# Patient Record
Sex: Female | Born: 1935 | Race: White | Hispanic: No | Smoking: Former smoker
Health system: Southern US, Community
[De-identification: ages and names within clinical notes are randomized; demographics above are authoritative.]

## PROBLEM LIST (undated history)

## (undated) DIAGNOSIS — M545 Low back pain, unspecified: Secondary | ICD-10-CM

## (undated) DIAGNOSIS — M1990 Unspecified osteoarthritis, unspecified site: Secondary | ICD-10-CM

## (undated) DIAGNOSIS — R011 Cardiac murmur, unspecified: Secondary | ICD-10-CM

## (undated) DIAGNOSIS — J159 Unspecified bacterial pneumonia: Secondary | ICD-10-CM

## (undated) DIAGNOSIS — G4700 Insomnia, unspecified: Secondary | ICD-10-CM

## (undated) DIAGNOSIS — I10 Essential (primary) hypertension: Secondary | ICD-10-CM

## (undated) DIAGNOSIS — B370 Candidal stomatitis: Secondary | ICD-10-CM

## (undated) DIAGNOSIS — G2581 Restless legs syndrome: Secondary | ICD-10-CM

## (undated) DIAGNOSIS — M25519 Pain in unspecified shoulder: Secondary | ICD-10-CM

## (undated) DIAGNOSIS — N390 Urinary tract infection, site not specified: Secondary | ICD-10-CM

## (undated) DIAGNOSIS — E039 Hypothyroidism, unspecified: Secondary | ICD-10-CM

## (undated) DIAGNOSIS — J441 Chronic obstructive pulmonary disease with (acute) exacerbation: Secondary | ICD-10-CM

## (undated) DIAGNOSIS — R002 Palpitations: Secondary | ICD-10-CM

## (undated) DIAGNOSIS — E871 Hypo-osmolality and hyponatremia: Secondary | ICD-10-CM

## (undated) DIAGNOSIS — B9789 Other viral agents as the cause of diseases classified elsewhere: Secondary | ICD-10-CM

## (undated) DIAGNOSIS — Z8489 Family history of other specified conditions: Secondary | ICD-10-CM

## (undated) DIAGNOSIS — C801 Malignant (primary) neoplasm, unspecified: Secondary | ICD-10-CM

## (undated) DIAGNOSIS — J4489 Other specified chronic obstructive pulmonary disease: Secondary | ICD-10-CM

## (undated) DIAGNOSIS — E559 Vitamin D deficiency, unspecified: Secondary | ICD-10-CM

## (undated) DIAGNOSIS — J449 Chronic obstructive pulmonary disease, unspecified: Secondary | ICD-10-CM

## (undated) DIAGNOSIS — R7309 Other abnormal glucose: Secondary | ICD-10-CM

## (undated) DIAGNOSIS — G0490 Encephalitis and encephalomyelitis, unspecified: Secondary | ICD-10-CM

## (undated) HISTORY — DX: Other abnormal glucose: R73.09

## (undated) HISTORY — DX: Encephalitis and encephalomyelitis, unspecified: G04.90

## (undated) HISTORY — DX: Low back pain: M54.5

## (undated) HISTORY — DX: Other specified chronic obstructive pulmonary disease: J44.89

## (undated) HISTORY — DX: Malignant (primary) neoplasm, unspecified: C80.1

## (undated) HISTORY — DX: Low back pain, unspecified: M54.50

## (undated) HISTORY — DX: Chronic obstructive pulmonary disease with (acute) exacerbation: J44.1

## (undated) HISTORY — DX: Insomnia, unspecified: G47.00

## (undated) HISTORY — DX: Unspecified bacterial pneumonia: J15.9

## (undated) HISTORY — DX: Hypo-osmolality and hyponatremia: E87.1

## (undated) HISTORY — PX: HEMORRHOID SURGERY: SHX153

## (undated) HISTORY — DX: Palpitations: R00.2

## (undated) HISTORY — PX: MASTECTOMY, RADICAL: SHX710

## (undated) HISTORY — PX: EYE SURGERY: SHX253

## (undated) HISTORY — DX: Hypothyroidism, unspecified: E03.9

## (undated) HISTORY — DX: Chronic obstructive pulmonary disease, unspecified: J44.9

## (undated) HISTORY — DX: Candidal stomatitis: B37.0

## (undated) HISTORY — DX: Vitamin D deficiency, unspecified: E55.9

## (undated) HISTORY — DX: Pain in unspecified shoulder: M25.519

## (undated) HISTORY — DX: Other viral agents as the cause of diseases classified elsewhere: B97.89

## (undated) HISTORY — DX: Essential (primary) hypertension: I10

## (undated) HISTORY — DX: Urinary tract infection, site not specified: N39.0

---

## 1959-04-20 DIAGNOSIS — G0490 Encephalitis and encephalomyelitis, unspecified: Secondary | ICD-10-CM

## 1959-04-20 HISTORY — DX: Encephalitis and encephalomyelitis, unspecified: G04.90

## 1998-12-29 ENCOUNTER — Other Ambulatory Visit: Admission: RE | Admit: 1998-12-29 | Discharge: 1998-12-29 | Payer: Self-pay | Admitting: Cardiology

## 2000-03-31 ENCOUNTER — Other Ambulatory Visit: Admission: RE | Admit: 2000-03-31 | Discharge: 2000-03-31 | Payer: Self-pay | Admitting: Obstetrics and Gynecology

## 2000-05-06 ENCOUNTER — Other Ambulatory Visit: Admission: RE | Admit: 2000-05-06 | Discharge: 2000-05-06 | Payer: Self-pay | Admitting: Obstetrics and Gynecology

## 2001-10-04 ENCOUNTER — Encounter: Payer: Self-pay | Admitting: Obstetrics and Gynecology

## 2001-10-04 ENCOUNTER — Ambulatory Visit (HOSPITAL_COMMUNITY): Admission: RE | Admit: 2001-10-04 | Discharge: 2001-10-04 | Payer: Self-pay | Admitting: Obstetrics and Gynecology

## 2001-11-16 ENCOUNTER — Ambulatory Visit (HOSPITAL_COMMUNITY): Admission: RE | Admit: 2001-11-16 | Discharge: 2001-11-16 | Payer: Self-pay | Admitting: Gastroenterology

## 2001-11-16 LAB — HM COLONOSCOPY: HM Colonoscopy: NORMAL

## 2002-04-12 ENCOUNTER — Encounter: Payer: Self-pay | Admitting: Emergency Medicine

## 2002-04-12 ENCOUNTER — Emergency Department (HOSPITAL_COMMUNITY): Admission: EM | Admit: 2002-04-12 | Discharge: 2002-04-12 | Payer: Self-pay | Admitting: Emergency Medicine

## 2002-10-16 ENCOUNTER — Encounter: Admission: RE | Admit: 2002-10-16 | Discharge: 2002-10-16 | Payer: Self-pay | Admitting: Internal Medicine

## 2002-10-16 ENCOUNTER — Encounter: Payer: Self-pay | Admitting: Internal Medicine

## 2002-12-18 ENCOUNTER — Encounter: Payer: Self-pay | Admitting: Internal Medicine

## 2002-12-18 ENCOUNTER — Encounter: Admission: RE | Admit: 2002-12-18 | Discharge: 2002-12-18 | Payer: Self-pay | Admitting: Internal Medicine

## 2003-01-01 ENCOUNTER — Encounter: Admission: RE | Admit: 2003-01-01 | Discharge: 2003-01-01 | Payer: Self-pay | Admitting: Internal Medicine

## 2003-01-01 ENCOUNTER — Encounter: Payer: Self-pay | Admitting: Internal Medicine

## 2003-01-15 ENCOUNTER — Encounter: Admission: RE | Admit: 2003-01-15 | Discharge: 2003-01-15 | Payer: Self-pay | Admitting: Orthopedic Surgery

## 2003-01-15 ENCOUNTER — Encounter: Payer: Self-pay | Admitting: Orthopedic Surgery

## 2003-04-20 HISTORY — PX: OTHER SURGICAL HISTORY: SHX169

## 2004-02-28 ENCOUNTER — Other Ambulatory Visit: Admission: RE | Admit: 2004-02-28 | Discharge: 2004-02-28 | Payer: Self-pay | Admitting: Obstetrics and Gynecology

## 2004-04-19 LAB — CONVERTED CEMR LAB: Pap Smear: NORMAL

## 2004-05-18 ENCOUNTER — Encounter: Admission: RE | Admit: 2004-05-18 | Discharge: 2004-05-18 | Payer: Self-pay | Admitting: Internal Medicine

## 2004-09-05 ENCOUNTER — Encounter: Admission: RE | Admit: 2004-09-05 | Discharge: 2004-09-05 | Payer: Self-pay | Admitting: Orthopedic Surgery

## 2004-11-20 ENCOUNTER — Ambulatory Visit (HOSPITAL_COMMUNITY): Admission: RE | Admit: 2004-11-20 | Discharge: 2004-11-20 | Payer: Self-pay | Admitting: Internal Medicine

## 2005-04-19 HISTORY — PX: OTHER SURGICAL HISTORY: SHX169

## 2005-08-09 ENCOUNTER — Ambulatory Visit (HOSPITAL_COMMUNITY): Admission: RE | Admit: 2005-08-09 | Discharge: 2005-08-09 | Payer: Self-pay | Admitting: Cardiovascular Disease

## 2006-01-26 ENCOUNTER — Encounter: Payer: Self-pay | Admitting: Family Medicine

## 2006-12-30 ENCOUNTER — Encounter: Admission: RE | Admit: 2006-12-30 | Discharge: 2006-12-30 | Payer: Self-pay | Admitting: General Surgery

## 2007-01-18 ENCOUNTER — Ambulatory Visit: Payer: Self-pay | Admitting: Family Medicine

## 2007-01-18 DIAGNOSIS — E039 Hypothyroidism, unspecified: Secondary | ICD-10-CM

## 2007-01-18 DIAGNOSIS — I10 Essential (primary) hypertension: Secondary | ICD-10-CM

## 2007-01-18 DIAGNOSIS — E871 Hypo-osmolality and hyponatremia: Secondary | ICD-10-CM | POA: Insufficient documentation

## 2007-01-19 ENCOUNTER — Encounter: Payer: Self-pay | Admitting: Family Medicine

## 2007-03-01 ENCOUNTER — Ambulatory Visit: Payer: Self-pay | Admitting: Family Medicine

## 2007-03-01 DIAGNOSIS — M545 Low back pain: Secondary | ICD-10-CM

## 2007-03-15 ENCOUNTER — Ambulatory Visit: Payer: Self-pay | Admitting: Family Medicine

## 2007-04-24 ENCOUNTER — Ambulatory Visit: Payer: Self-pay | Admitting: Family Medicine

## 2007-05-18 ENCOUNTER — Ambulatory Visit: Payer: Self-pay | Admitting: Family Medicine

## 2007-05-21 ENCOUNTER — Emergency Department (HOSPITAL_COMMUNITY): Admission: EM | Admit: 2007-05-21 | Discharge: 2007-05-21 | Payer: Self-pay | Admitting: Emergency Medicine

## 2007-05-22 ENCOUNTER — Ambulatory Visit: Payer: Self-pay | Admitting: Family Medicine

## 2007-05-26 ENCOUNTER — Ambulatory Visit: Payer: Self-pay | Admitting: Family Medicine

## 2007-05-27 ENCOUNTER — Ambulatory Visit: Payer: Self-pay | Admitting: Family Medicine

## 2007-05-29 ENCOUNTER — Ambulatory Visit: Payer: Self-pay | Admitting: Family Medicine

## 2007-06-02 ENCOUNTER — Ambulatory Visit: Payer: Self-pay | Admitting: Family Medicine

## 2007-07-17 ENCOUNTER — Ambulatory Visit: Payer: Self-pay | Admitting: Family Medicine

## 2007-07-19 ENCOUNTER — Ambulatory Visit: Payer: Self-pay | Admitting: Family Medicine

## 2007-07-24 ENCOUNTER — Ambulatory Visit: Payer: Self-pay | Admitting: Family Medicine

## 2007-07-25 LAB — CONVERTED CEMR LAB
ALT: 23 units/L (ref 0–35)
BUN: 12 mg/dL (ref 6–23)
Creatinine, Ser: 0.9 mg/dL (ref 0.4–1.2)
Eosinophils Relative: 2 % (ref 0.0–5.0)
Glucose, Bld: 105 mg/dL — ABNORMAL HIGH (ref 70–99)
HCT: 37.7 % (ref 36.0–46.0)
HDL: 57.1 mg/dL (ref >39.0)
Hemoglobin: 12.7 g/dL (ref 12.0–15.0)
Lymphocytes Relative: 17.9 % (ref 12.0–46.0)
MCHC: 33.7 g/dL (ref 30.0–36.0)
MCV: 94.3 fL (ref 78.0–100.0)
Monocytes Relative: 8.4 % (ref 3.0–12.0)
Neutro Abs: 6.2 10*3/uL (ref 1.4–7.7)
Platelets: 221 10*3/uL (ref 150–400)
Potassium: 4.1 meq/L (ref 3.5–5.1)
RBC: 4 M/uL (ref 3.87–5.11)
Sodium: 134 meq/L — ABNORMAL LOW (ref 135–145)
Total Protein: 7.8 g/dL (ref 6.0–8.3)
WBC: 8.6 10*3/uL (ref 4.5–10.5)

## 2007-10-25 ENCOUNTER — Ambulatory Visit: Payer: Self-pay | Admitting: Family Medicine

## 2007-10-25 DIAGNOSIS — G4700 Insomnia, unspecified: Secondary | ICD-10-CM

## 2007-11-09 ENCOUNTER — Encounter: Payer: Self-pay | Admitting: Family Medicine

## 2007-12-19 ENCOUNTER — Ambulatory Visit: Payer: Self-pay | Admitting: Family Medicine

## 2007-12-19 DIAGNOSIS — J449 Chronic obstructive pulmonary disease, unspecified: Secondary | ICD-10-CM

## 2007-12-26 ENCOUNTER — Ambulatory Visit: Payer: Self-pay | Admitting: Family Medicine

## 2007-12-28 ENCOUNTER — Telehealth: Payer: Self-pay | Admitting: Family Medicine

## 2008-01-01 ENCOUNTER — Encounter (INDEPENDENT_AMBULATORY_CARE_PROVIDER_SITE_OTHER): Payer: Self-pay | Admitting: *Deleted

## 2008-01-08 ENCOUNTER — Ambulatory Visit: Payer: Self-pay | Admitting: Family Medicine

## 2008-01-11 ENCOUNTER — Ambulatory Visit: Payer: Self-pay | Admitting: Family Medicine

## 2008-02-15 ENCOUNTER — Ambulatory Visit: Payer: Self-pay | Admitting: Family Medicine

## 2008-03-01 ENCOUNTER — Ambulatory Visit: Payer: Self-pay | Admitting: Family Medicine

## 2008-04-08 ENCOUNTER — Ambulatory Visit: Payer: Self-pay | Admitting: Family Medicine

## 2008-04-16 ENCOUNTER — Ambulatory Visit: Payer: Self-pay | Admitting: Family Medicine

## 2008-04-26 ENCOUNTER — Encounter: Payer: Self-pay | Admitting: Family Medicine

## 2008-04-26 ENCOUNTER — Ambulatory Visit: Payer: Self-pay | Admitting: Internal Medicine

## 2008-05-17 ENCOUNTER — Telehealth: Payer: Self-pay | Admitting: Family Medicine

## 2008-07-16 ENCOUNTER — Encounter: Admission: RE | Admit: 2008-07-16 | Discharge: 2008-07-16 | Payer: Self-pay | Admitting: Surgery

## 2008-10-01 ENCOUNTER — Telehealth: Payer: Self-pay | Admitting: Family Medicine

## 2008-10-08 ENCOUNTER — Ambulatory Visit: Payer: Self-pay | Admitting: Family Medicine

## 2008-10-14 LAB — CONVERTED CEMR LAB
ALT: 30 units/L (ref 0–35)
AST: 24 units/L (ref 0–37)
Albumin: 3.8 g/dL (ref 3.5–5.2)
Basophils Absolute: 0 10*3/uL (ref 0.0–0.1)
Basophils Relative: 0 % (ref 0.0–3.0)
Chloride: 99 meq/L (ref 96–112)
Cholesterol: 158 mg/dL (ref 0–200)
Creatinine, Ser: 0.8 mg/dL (ref 0.4–1.2)
GFR calc non Af Amer: 72.84 mL/min (ref >60)
Glucose, Bld: 112 mg/dL — ABNORMAL HIGH (ref 70–99)
HCT: 38.5 % (ref 36.0–46.0)
LDL Cholesterol: 93 mg/dL (ref 0–99)
Lymphocytes Relative: 39 % (ref 12.0–46.0)
Lymphs Abs: 3.1 10*3/uL (ref 0.7–4.0)
Monocytes Absolute: 0.6 10*3/uL (ref 0.1–1.0)
Neutro Abs: 3.9 10*3/uL (ref 1.4–7.7)
Potassium: 4.4 meq/L (ref 3.5–5.1)
Sodium: 136 meq/L (ref 135–145)
TSH: 4.44 microintl units/mL (ref 0.35–5.50)
Total Bilirubin: 0.8 mg/dL (ref 0.3–1.2)
Total CHOL/HDL Ratio: 4
Total Protein: 7.7 g/dL (ref 6.0–8.3)
Triglycerides: 127 mg/dL (ref 0.0–149.0)
Vit D, 25-Hydroxy: 25 ng/mL — ABNORMAL LOW (ref 30–89)
Vitamin B-12: 496 pg/mL (ref 211–911)
WBC: 7.9 10*3/uL (ref 4.5–10.5)

## 2008-10-16 ENCOUNTER — Ambulatory Visit: Payer: Self-pay | Admitting: Family Medicine

## 2008-10-30 ENCOUNTER — Encounter: Payer: Self-pay | Admitting: Family Medicine

## 2008-12-06 ENCOUNTER — Encounter: Payer: Self-pay | Admitting: Family Medicine

## 2009-01-28 ENCOUNTER — Ambulatory Visit: Payer: Self-pay | Admitting: Family Medicine

## 2009-05-19 ENCOUNTER — Ambulatory Visit: Payer: Self-pay | Admitting: Family Medicine

## 2009-05-21 LAB — CONVERTED CEMR LAB
AST: 29 units/L (ref 0–37)
Bilirubin, Direct: 0.1 mg/dL (ref 0.0–0.3)
Calcium: 9.3 mg/dL (ref 8.4–10.5)
GFR calc non Af Amer: 63.48 mL/min (ref >60)
Glucose, Bld: 100 mg/dL — ABNORMAL HIGH (ref 70–99)
Total Bilirubin: 0.4 mg/dL (ref 0.3–1.2)
Total Protein: 8 g/dL (ref 6.0–8.3)

## 2009-10-07 ENCOUNTER — Encounter: Admission: RE | Admit: 2009-10-07 | Discharge: 2009-10-07 | Payer: Self-pay | Admitting: Surgery

## 2009-10-14 ENCOUNTER — Ambulatory Visit: Payer: Self-pay | Admitting: Family Medicine

## 2009-10-14 LAB — CONVERTED CEMR LAB
Bilirubin, Direct: 0.1 mg/dL (ref 0.0–0.3)
CO2: 30 meq/L (ref 19–32)
Calcium: 9.1 mg/dL (ref 8.4–10.5)
Chloride: 102 meq/L (ref 96–112)
Creatinine, Ser: 0.8 mg/dL (ref 0.4–1.2)
GFR calc non Af Amer: 72.66 mL/min (ref >60)
HDL: 44.4 mg/dL (ref >39.00)
LDL Cholesterol: 106 mg/dL — ABNORMAL HIGH (ref 0–99)
TSH: 3.13 microintl units/mL (ref 0.35–5.50)
Total CHOL/HDL Ratio: 4
Triglycerides: 162 mg/dL — ABNORMAL HIGH (ref 0.0–149.0)
Vit D, 25-Hydroxy: 20 ng/mL — ABNORMAL LOW (ref 30–89)

## 2009-10-17 ENCOUNTER — Ambulatory Visit: Payer: Self-pay | Admitting: Family Medicine

## 2009-10-17 DIAGNOSIS — E559 Vitamin D deficiency, unspecified: Secondary | ICD-10-CM

## 2009-10-17 DIAGNOSIS — R7303 Prediabetes: Secondary | ICD-10-CM | POA: Insufficient documentation

## 2009-11-12 ENCOUNTER — Ambulatory Visit: Payer: Self-pay | Admitting: Family Medicine

## 2010-01-09 ENCOUNTER — Encounter: Payer: Self-pay | Admitting: Family Medicine

## 2010-01-27 ENCOUNTER — Ambulatory Visit: Payer: Self-pay | Admitting: Family Medicine

## 2010-03-24 ENCOUNTER — Ambulatory Visit: Payer: Self-pay | Admitting: Family Medicine

## 2010-04-23 ENCOUNTER — Ambulatory Visit
Admission: RE | Admit: 2010-04-23 | Discharge: 2010-04-23 | Payer: Self-pay | Source: Home / Self Care | Attending: Family Medicine | Admitting: Family Medicine

## 2010-04-23 DIAGNOSIS — N390 Urinary tract infection, site not specified: Secondary | ICD-10-CM | POA: Insufficient documentation

## 2010-04-23 LAB — CONVERTED CEMR LAB
Nitrite: NEGATIVE
Specific Gravity, Urine: 1.02

## 2010-04-24 ENCOUNTER — Encounter: Payer: Self-pay | Admitting: Family Medicine

## 2010-04-30 ENCOUNTER — Telehealth: Payer: Self-pay | Admitting: Family Medicine

## 2010-05-19 NOTE — Assessment & Plan Note (Signed)
Summary: FLU SHOT/CLE  Nurse Visit   Allergies: 1)  ! Demerol  Orders Added: 1)  Flu Vaccine 86yrs + MEDICARE PATIENTS [Q2039] 2)  Administration Flu vaccine - MCR [G0008]  Flu Vaccine Consent Questions     Do you have a history of severe allergic reactions to this vaccine? no    Any prior history of allergic reactions to egg and/or gelatin? no    Do you have a sensitivity to the preservative Thimersol? no    Do you have a past history of Guillan-Barre Syndrome? no    Do you currently have an acute febrile illness? no    Have you ever had a severe reaction to latex? no    Vaccine information given and explained to patient? yes    Are you currently pregnant? no    Lot Number:[id number]   Exp Date:10/17/2010   Site Given  Left Deltoid IM

## 2010-05-19 NOTE — Letter (Signed)
Summary: [facility]   Imported By: Sherian Rein 01/29/2010 10:20:52  _____________________________________________________________________  External Attachment:    Type:   Image     Comment:   External Document

## 2010-05-19 NOTE — Assessment & Plan Note (Signed)
Summary: CPX / LFW   Vital Signs:  Patient profile:   77 year old female Height:      64.5 inches Weight:      190.6 pounds BMI:     32.33 Temp:     98.2 degrees F oral Pulse rate:   72 / minute Pulse rhythm:   regular BP sitting:   140 / 80  (left arm) Cuff size:   large  Vitals Entered By: Benny Lennert CMA Duncan Dull) (October 17, 2009 11:16 AM)  History of Present Illness: Chief complaint cpx  Doing well overall. Uses cane. No recent falls.    Cholesterol..increase from last year, but not far from goal.  Prediabetes, increase from last year. Eats a lot of white bread and chocolate. Gatorade for low potassium.   Reviewed labs in detatil...low vit D...refuses DXA. Not on calciuma nd vit D supplement.   Hypertension History:      She denies headache, chest pain, palpitations, orthopnea, and side effects from treatment.  Not checking a t home.   Stable sweling peripherally on diueretic. Marland Kitchen        Positive major cardiovascular risk factors include female age 58 years old or older and hypertension.  Negative major cardiovascular risk factors include non-tobacco-user status.     Problems Prior to Update: 1)  Routine General Medical Exam@[facility]  (ICD-V70.0) 2)  Chronic Obstructive Pulmonary Disease  (ICD-496) 3)  Insomnia, Chronic  (ICD-307.42) 4)  Shoulder Pain, Right, Chronic  (ICD-719.41) 5)  Palpitations  (ICD-785.1) 6)  Screening For Lipoid Disorders  (ICD-V77.91) 7)  Candidiasis, Oral  (ICD-112.0) 8)  Chronic Obstructive Pulmonary Disease, Acute Exacerbation  (ICD-491.21) 9)  Viral Infection  (ICD-079.99) 10)  Back Pain, Lumbar  (ICD-724.2) 11)  Hypertension  (ICD-401.9) 12)  Hyponatremia, Mild  (ICD-276.1) 13)  Hypothyroidism  (ICD-244.9)  Current Medications (verified): 1)  Levothyroxine Sodium 88 Mcg  Tabs (Levothyroxine Sodium) .... Take 1 Tablet By Mouth Once A Day 2)  Amlodipine Besylate 10 Mg  Tabs (Amlodipine Besylate) .... Take 1 Tablet By Mouth Once A  Day 3)  Diovan 160 Mg  Tabs (Valsartan) .... Take 1 Tablet By Mouth Once A Day 4)  Toprol Xl 50 Mg Xr24h-Tab (Metoprolol Succinate) .... Take One By Mouth Daily 5)  Adult Aspirin Ec Low Strength 81 Mg  Tbec (Aspirin) .... Take 1 Tablet By Mouth Once A Day 6)  Furosemide 40 Mg  Tabs (Furosemide) .... Take 1 Tablet By Mouth Every Morning 7)  Advil 200 Mg Tabs (Ibuprofen) .... Take One Table By Mouth Twice Daily As Needed 8)  Vitamin D (Ergocalciferol) 50000 Unit Caps (Ergocalciferol) .Marland Kitchen.. 1 Tab By Mouth Weekly X 12 Weeks  Allergies: 1)  ! Demerol  Past History:  Past medical, surgical, family and social histories (including risk factors) reviewed, and no changes noted (except as noted below).  Past Medical History: Reviewed history from 03/01/2008 and no changes required. Current Problems:  CHRONIC OBSTRUCTIVE PULMONARY DISEASE, MILD (ICD-496) INSOMNIA, CHRONIC (ICD-307.42) SHOULDER PAIN, RIGHT, CHRONIC (ICD-719.41) PALPITATIONS (ICD-785.1) SCREENING FOR LIPOID DISORDERS (ICD-V77.91) BACTERIAL PNEUMONIA (ICD-482.9) CANDIDIASIS, ORAL (ICD-112.0) CHRONIC OBSTRUCTIVE PULMONARY DISEASE, ACUTE EXACERBATION (ICD-491.21) VIRAL INFECTION (ICD-079.99) BACK PAIN, LUMBAR (ICD-724.2) HYPERTENSION (ICD-401.9) HYPONATREMIA, MILD (ICD-276.1) HYPOTHYROIDISM (ICD-244.9)    Past Surgical History: Reviewed history from 03/01/2007 and no changes required. 04/19/05 ABI neg, 04/19/05 LE venous dopplers nml MRI of back in2005 mild bulging discs  Family History: Reviewed history from 01/18/2007 and no changes required. father died age 55 RA  mother died age 69 CVA, DM,  2 sister healthy aunts liver cancer, stomach cancer  Social History: Reviewed history from 03/01/2007 and no changes required. Married x 60 years Occupation: Former Smoker 20 pack year history (1/2 pack a day x 42 years) Alcohol use-no Drug use-no Regular exercise-no: uses cane to ambulate, unsteady on feet Diet: moderately  healthy. a lot of desserts  Review of Systems General:  Denies fatigue and fever. CV:  Denies chest pain or discomfort. Resp:  Denies shortness of breath, sputum productive, and wheezing. GI:  Denies abdominal pain. GU:  Denies dysuria.  Physical Exam  General:  overweight female in NAD Eyes:  No corneal or conjunctival inflammation noted. EOMI. Perrla.  Ears:  External ear exam shows no significant lesions or deformities.  Otoscopic examination reveals clear canals, tympanic membranes are intact bilaterally without bulging, retraction, inflammation or discharge. Hearing is grossly normal bilaterally. Nose:  External nasal examination shows no deformity or inflammation. Nasal mucosa are pink and moist without lesions or exudates. Mouth:  Oral mucosa and oropharynx without lesions or exudates.  MMM Neck:  no carotid bruit or thyromegaly no cervical or supraclavicular lymphadenopathy  Breasts:  per onc surgeon Lungs:  Normal respiratory effort, chest expands symmetrically. Lungs are clear to auscultation, no crackles or wheezes. Heart:  Normal rate and regular rhythm. S1 and S2 normal without gallop, murmur, click, rub or other extra sounds. Abdomen:  Bowel sounds positive,abdomen soft and non-tender without masses, organomegaly or hernias noted. Genitalia:  not indicated Msk:  No deformity or scoliosis noted of thoracic or lumbar spine.   Pulses:  R and L posterior tibial pulses are full and equal bilaterally  Extremities:  no edema Neurologic:  No cranial nerve deficits noted. Slow gait, wlaks with cane.  Sensory, motor and coordinative functions appear intact.alert & oriented X3.   Skin:  Intact without suspicious lesions or rashes Psych:  Cognition and judgment appear intact. Alert and cooperative with normal attention span and concentration. No apparent delusions, illusions, hallucinations   Impression & Recommendations:  Problem # 1:  CHRONIC OBSTRUCTIVE PULMONARY DISEASE  (ICD-496) Stable   Problem # 2:  HYPERTENSION (ICD-401.9) Well controlled. Continue current medication.  Her updated medication list for this problem includes:    Amlodipine Besylate 10 Mg Tabs (Amlodipine besylate) .Marland Kitchen... Take 1 tablet by mouth once a day    Diovan 160 Mg Tabs (Valsartan) .Marland Kitchen... Take 1 tablet by mouth once a day    Toprol Xl 50 Mg Xr24h-tab (Metoprolol succinate) .Marland Kitchen... Take one by mouth daily    Furosemide 40 Mg Tabs (Furosemide) .Marland Kitchen... Take 1 tablet by mouth every morning  Problem # 3:  HYPOTHYROIDISM (ICD-244.9) Well controlled. Continue current medication.  Her updated medication list for this problem includes:    Levothyroxine Sodium 88 Mcg Tabs (Levothyroxine sodium) .Marland Kitchen... Take 1 tablet by mouth once a day  Problem # 4:  HYPONATREMIA, MILD (ICD-276.1) Normal.  Problem # 5:  PREDIABETES (ICD-790.29) Encouraged exercise, weight loss, healthy eating habits.  Labs Reviewed: Creat: 0.9 (05/19/2009)     Complete Medication List: 1)  Levothyroxine Sodium 88 Mcg Tabs (Levothyroxine sodium) .... Take 1 tablet by mouth once a day 2)  Amlodipine Besylate 10 Mg Tabs (Amlodipine besylate) .... Take 1 tablet by mouth once a day 3)  Diovan 160 Mg Tabs (Valsartan) .... Take 1 tablet by mouth once a day 4)  Toprol Xl 50 Mg Xr24h-tab (Metoprolol succinate) .... Take one by mouth daily 5)  Adult Aspirin  Ec Low Strength 81 Mg Tbec (Aspirin) .... Take 1 tablet by mouth once a day 6)  Furosemide 40 Mg Tabs (Furosemide) .... Take 1 tablet by mouth every morning 7)  Advil 200 Mg Tabs (Ibuprofen) .... Take one table by mouth twice daily as needed 8)  Vitamin D (ergocalciferol) 50000 Unit Caps (Ergocalciferol) .Marland Kitchen.. 1 tab by mouth weekly x 12 weeks  Hypertension Assessment/Plan:      The patient's hypertensive risk group is category B: At least one risk factor (excluding diabetes) with no target organ damage.  Her calculated 10 year risk of coronary heart disease is 13 %.  Today's blood  pressure is 140/80.  Her blood pressure goal is < 140/90.  Patient Instructions: 1)  Decrease carbohydraytes and sweet foods for prediabetes. 2)  Look into mineral water instead of gatorade.  3)  Start vit Vit D weekly...x 12 weeks then change to Calcium and vit D supplement.  4)  Please schedule a follow-up appointment in 6 months .  Prescriptions: VITAMIN D (ERGOCALCIFEROL) 50000 UNIT CAPS (ERGOCALCIFEROL) 1 tab by mouth weekly x 12 weeks  #12 x 0   Entered and Authorized by:   Kerby Nora MD   Signed by:   Kerby Nora MD on 10/17/2009   Method used:   Electronically to        [facility]* (retail)       [address]       [place], [place]  [address]       Ph: [phone]       Fax: [phone]   RxID:   [id number]   Current Allergies (reviewed today): ! DEMEROL  TD Next Due:  Refused Herpes Zoster Result Date:  10/17/2009 Herpes Zoster Result:  given Last Mammogram:  normal (07/18/2008 12:31:31 PM) Mammogram Result Date:  10/06/2009 Mammogram Result:  normal Mammogram Next Due:  1 yr Bone Density Next Due: Refused      Appended Document: CPX / LFW    Clinical Lists Changes  Orders: Added new Service order of Zoster (Shingles) Vaccine Live [phone]) - Signed Added new Service order of Admin 1st Vaccine (32951) - Signed Observations: Added new observation of ZOSTAVAX VIS: 01/29/05 given October 17, 2009. (10/17/2009 12:14) Added new observation of ZOSTAVAX LOT: [id number] (10/17/2009 12:14) Added new observation of ZOSTAVAX EXP: 11/12/2010 (10/17/2009 12:14) Added new observation of ZOSTAVAX BY: Benny Lennert CMA (AAMA) (10/17/2009 12:14) Added new observation of ZOSTAVAX RTE: [place] (10/17/2009 12:14) Added new observation of ZOSTAVAXDOSE: 0.5 ml (10/17/2009 12:14) Added new observation of ZOSTAVAX MFR: Merck (10/17/2009 12:14) Added new observation of ZOSTAVAXSITE: left deltoid (10/17/2009 12:14) Added new observation of ZOSTAVAX: Zostavax (10/17/2009  12:14)       Immunizations Administered:  Zostavax # 1:    Vaccine Type: Zostavax    Site: left deltoid    Mfr: Merck    Dose: 0.5 ml    Route: [place]    Given by: Benny Lennert CMA (AAMA)    Exp. Date: 11/12/2010    Lot #: [id number]    VIS given: 01/29/05 given October 17, 2009.

## 2010-05-19 NOTE — Assessment & Plan Note (Signed)
Summary: LEFT EYE SCRATCH/DLO   Vital Signs:  Patient profile:   77 year old female Height:      64.5 inches Weight:      189.8 pounds BMI:     32.19 Temp:     97.8 degrees F oral Pulse rate:   72 / minute Pulse rhythm:   regular BP sitting:   130 / 70  (left arm) Cuff size:   large  Vitals Entered By: Benny Lennert CMA Duncan Dull) (November 12, 2009 4:05 PM)  History of Present Illness: Chief complaint scratched left eye  over the weekend, patient scratch her left thigh. She did develop some small amount of bleeding and this was at her eyelid level. She has baseline some significant allergies, and now she has some significant irritation around the periorbital areas well.  There is itching and irritation.  otherwise she feels fine is not having fever, chills or sweats. Tolerate p.o. fine.    Allergies: 1)  ! Demerol  Past History:  Past medical, surgical, family and social histories (including risk factors) reviewed, and no changes noted (except as noted below).  Past Medical History: Reviewed history from 03/01/2008 and no changes required. Current Problems:  CHRONIC OBSTRUCTIVE PULMONARY DISEASE, MILD (ICD-496) INSOMNIA, CHRONIC (ICD-307.42) SHOULDER PAIN, RIGHT, CHRONIC (ICD-719.41) PALPITATIONS (ICD-785.1) SCREENING FOR LIPOID DISORDERS (ICD-V77.91) BACTERIAL PNEUMONIA (ICD-482.9) CANDIDIASIS, ORAL (ICD-112.0) CHRONIC OBSTRUCTIVE PULMONARY DISEASE, ACUTE EXACERBATION (ICD-491.21) VIRAL INFECTION (ICD-079.99) BACK PAIN, LUMBAR (ICD-724.2) HYPERTENSION (ICD-401.9) HYPONATREMIA, MILD (ICD-276.1) HYPOTHYROIDISM (ICD-244.9)    Past Surgical History: Reviewed history from 03/01/2007 and no changes required. 04/19/05 ABI neg, 04/19/05 LE venous dopplers nml MRI of back in2005 mild bulging discs  Family History: Reviewed history from 01/18/2007 and no changes required. father died age 48 RA mother died age 41 CVA, DM,  2 sister healthy aunts liver cancer, stomach  cancer  Social History: Reviewed history from 03/01/2007 and no changes required. Married x 60 years Occupation: Former Smoker 20 pack year history (1/2 pack a day x 42 years) Alcohol use-no Drug use-no Regular exercise-no: uses cane to ambulate, unsteady on feet Diet: moderately healthy. a lot of desserts  Physical Exam  General:  GEN: Well-developed,well-nourished,in no acute distress; alert,appropriate and cooperative throughout examination HEENT: Normocephalic and atraumatic without obvious abnormalities. No apparent alopecia or balding. Ears, externally no deformities PULM: Breathing comfortably in no respiratory distress EXT: No clubbing, cyanosis, or edema PSYCH: Normally interactive. Cooperative during the interview. Pleasant. Friendly and conversant. Not anxious or depressed appearing. Normal, full affect.    Eye Exam  Visual Fields     OD: normal right     OS: normal left Ocular Motility     OD: normal right     OS: normal left Adnexa and Eyelids     OD: normal right     OS: normal left Conjunctiva     OD: irritation     OS: iritation Iris and Pupil     OD: normal right     OS: normal left Cornea     OD: normal right     OS: left cornea on the floor seen examination is noted to have a vertical line  where there is floor seen uptake. This is aparent laterally   Impression & Recommendations:  Problem # 1:  CORNEAL ABRASION, LEFT (ICD-918.1) foreseen examination is consistent with corneal abrasion.  I I will treat the patient conservatively, and placed on antibiotics every 30 minutes for 2 days  and then have her followup with  ophthalmology.  Also had my partner Dr. Milinda Antis visualize her eye under fluoroscein, and she agreed with my dx.   Orders: Ophthalmology Referral (Ophthalmology)  Complete Medication List: 1)  Levothyroxine Sodium 88 Mcg Tabs (Levothyroxine sodium) .... Take 1 tablet by mouth once a day 2)  Amlodipine Besylate 10 Mg Tabs (Amlodipine  besylate) .... Take 1 tablet by mouth once a day 3)  Diovan 160 Mg Tabs (Valsartan) .... Take 1 tablet by mouth once a day 4)  Toprol Xl 50 Mg Xr24h-tab (Metoprolol succinate) .... Take one by mouth daily 5)  Adult Aspirin Ec Low Strength 81 Mg Tbec (Aspirin) .... Take 1 tablet by mouth once a day 6)  Furosemide 40 Mg Tabs (Furosemide) .... Take 1 tablet by mouth every morning 7)  Advil 200 Mg Tabs (Ibuprofen) .... Take one table by mouth twice daily as needed 8)  Vitamin D (ergocalciferol) 50000 Unit Caps (Ergocalciferol) .Marland Kitchen.. 1 tab by mouth weekly x 12 weeks 9)  Ofloxacin 0.3 % Soln (Ofloxacin) .Marland Kitchen.. 1 drop q 30 mins while awake and q 4 hours during sleep for 48 hours, then 1 drop q 1 hour for 5 days, then 1 drop 4 times daily for 3 days  Patient Instructions: 1)  f/u with your eye doctor  Prescriptions: OFLOXACIN 0.3 % SOLN (OFLOXACIN) 1 drop q 30 MINs WHILE AWAKE AND q 4 HOURS DURING SLEEP for 48 hours, then 1 drop q 1 hour for 5 days, then 1 drop 4 times daily for 3 days  #1 x 0   Entered and Authorized by:   Hannah Beat MD   Signed by:   Hannah Beat MD on 11/12/2009   Method used:   Electronically to        [facility]* (retail)       [address]       [place], [place]  [address]       Ph: [phone]       Fax: [phone]   RxID:   [id number]   Current Allergies (reviewed today): ! DEMEROL

## 2010-05-19 NOTE — Assessment & Plan Note (Signed)
Summary: FOLLOW UP / LFW   Vital Signs:  Patient profile:   77 year old female Weight:      189.38 pounds Temp:     98.1 degrees F oral Pulse rate:   72 / minute Pulse rhythm:   regular BP sitting:   124 / 70  (left arm) Cuff size:   large  Vitals Entered By: Benny Lennert CMA Duncan Dull) (May 19, 2009 11:10 AM) CC: 6 month follow up, Hypertension Management   History of Present Illness: Doing well overall. No acute concerns.   Husband with double PNA.Marland Kitchenno CVA neg MRI. She has been  experiencing stress.  Hypertension History:      She denies headache, chest pain, peripheral edema, and side effects from treatment.  She notes no problems with any antihypertensive medication side effects.        Positive major cardiovascular risk factors include female age 58 years old or older and hypertension.  Negative major cardiovascular risk factors include non-tobacco-user status.    Preventive Screening-Counseling & Management  Alcohol-Tobacco     Alcohol drinks/day: 0     Smoking Status: never  Caffeine-Diet-Exercise     Diet Counseling: to improve diet; diet is suboptimal     Nutrition Referrals: no     Does Patient Exercise: no     Exercise Counseling: to improve exercise regimen  Problems Prior to Update: 1)  Chronic Obstructive Pulmonary Disease  (ICD-496) 2)  Insomnia, Chronic  (ICD-307.42) 3)  Shoulder Pain, Right, Chronic  (ICD-719.41) 4)  Palpitations  (ICD-785.1) 5)  Screening For Lipoid Disorders  (ICD-V77.91) 6)  Candidiasis, Oral  (ICD-112.0) 7)  Chronic Obstructive Pulmonary Disease, Acute Exacerbation  (ICD-491.21) 8)  Viral Infection  (ICD-079.99) 9)  Back Pain, Lumbar  (ICD-724.2) 10)  Hypertension  (ICD-401.9) 11)  Hyponatremia, Mild  (ICD-276.1) 12)  Hypothyroidism  (ICD-244.9)  Current Medications (verified): 1)  Levothyroxine Sodium 88 Mcg  Tabs (Levothyroxine Sodium) .... Take 1 Tablet By Mouth Once A Day 2)  Amlodipine Besylate 10 Mg  Tabs  (Amlodipine Besylate) .... Take 1 Tablet By Mouth Once A Day 3)  Diovan 160 Mg  Tabs (Valsartan) .... Take 1 Tablet By Mouth Once A Day 4)  Toprol Xl 50 Mg Xr24h-Tab (Metoprolol Succinate) .... Take One By Mouth Daily 5)  Adult Aspirin Ec Low Strength 81 Mg  Tbec (Aspirin) .... Take 1 Tablet By Mouth Once A Day 6)  Furosemide 40 Mg  Tabs (Furosemide) .... Take 1 Tablet By Mouth Every Morning 7)  Advil 200 Mg Tabs (Ibuprofen) .... Take One Table By Mouth Twice Daily As Needed  Allergies: 1)  ! Demerol  Past History:  Past medical, surgical, family and social histories (including risk factors) reviewed, and no changes noted (except as noted below).  Past Medical History: Reviewed history from 03/01/2008 and no changes required. Current Problems:  CHRONIC OBSTRUCTIVE PULMONARY DISEASE, MILD (ICD-496) INSOMNIA, CHRONIC (ICD-307.42) SHOULDER PAIN, RIGHT, CHRONIC (ICD-719.41) PALPITATIONS (ICD-785.1) SCREENING FOR LIPOID DISORDERS (ICD-V77.91) BACTERIAL PNEUMONIA (ICD-482.9) CANDIDIASIS, ORAL (ICD-112.0) CHRONIC OBSTRUCTIVE PULMONARY DISEASE, ACUTE EXACERBATION (ICD-491.21) VIRAL INFECTION (ICD-079.99) BACK PAIN, LUMBAR (ICD-724.2) HYPERTENSION (ICD-401.9) HYPONATREMIA, MILD (ICD-276.1) HYPOTHYROIDISM (ICD-244.9)    Past Surgical History: Reviewed history from 03/01/2007 and no changes required. 04/19/05 ABI neg, 04/19/05 LE venous dopplers nml MRI of back in2005 mild bulging discs  Family History: Reviewed history from 01/18/2007 and no changes required. father died age 32 RA mother died age 93 CVA, DM,  2 sister healthy aunts liver cancer, stomach cancer  Social History: Reviewed history from 03/01/2007 and no changes required. Married x 60 years Occupation: Former Smoker 20 pack year history (1/2 pack a day x 42 years) Alcohol use-no Drug use-no Regular exercise-no: uses cane to ambulate, unsteady on feet Diet: moderately healthy. a lot of dessertsSmoking Status:   never  Review of Systems General:  Denies fatigue and fever. CV:  Denies chest pain or discomfort. Resp:  Denies shortness of breath, sputum productive, and wheezing. GI:  Denies abdominal pain, bloody stools, constipation, and diarrhea. GU:  Denies abnormal vaginal bleeding and dysuria.  Physical Exam  General:  overweight female in NAD Head:  Normocephalic and atraumatic without obvious abnormalities. No apparent alopecia or balding. Eyes:  No corneal or conjunctival inflammation noted. EOMI. Perrla.  Ears:  External ear exam shows no significant lesions or deformities.  Otoscopic examination reveals clear canals, tympanic membranes are intact bilaterally without bulging, retraction, inflammation or discharge. Hearing is grossly normal bilaterally. Nose:  External nasal examination shows no deformity or inflammation. Nasal mucosa are pink and moist without lesions or exudates. Mouth:  Oral mucosa and oropharynx without lesions or exudates.  MMM Neck:  no carotid bruit or thyromegaly no cervical or supraclavicular lymphadenopathy  Breasts:  refused Lungs:  Normal respiratory effort, chest expands symmetrically. Lungs are clear to auscultation, no crackles or wheezes. Heart:  Normal rate and regular rhythm. S1 and S2 normal without gallop, murmur, click, rub or other extra sounds. Abdomen:  Bowel sounds positive,abdomen soft and non-tender without masses, organomegaly or hernias noted. Genitalia:  not indicated Pulses:  R and L posterior tibial pulses are full and equal bilaterally  Extremities:  no edema   Impression & Recommendations:  Problem # 1:  CHRONIC OBSTRUCTIVE PULMONARY DISEASE (ICD-496) Stable on current medicaitons.  The following medications were removed from the medication list:    Spiriva Handihaler 18 Mcg Caps (Tiotropium bromide monohydrate) .Marland Kitchen... 1cap inhaled daily  Problem # 2:  HYPERTENSION (ICD-401.9) Well controlled. Continue current medication. Due for labs  eval.  Her updated medication list for this problem includes:    Amlodipine Besylate 10 Mg Tabs (Amlodipine besylate) .Marland Kitchen... Take 1 tablet by mouth once a day    Diovan 160 Mg Tabs (Valsartan) .Marland Kitchen... Take 1 tablet by mouth once a day    Toprol Xl 50 Mg Xr24h-tab (Metoprolol succinate) .Marland Kitchen... Take one by mouth daily    Furosemide 40 Mg Tabs (Furosemide) .Marland Kitchen... Take 1 tablet by mouth every morning  Orders: TLB-BMP (Basic Metabolic Panel-BMET) (80048-METABOL) TLB-Hepatic/Liver Function Pnl (80076-HEPATIC)  Problem # 3:  Preventive Health Care (ICD-V70.0) Assessment: Comment Only Reviewed preventive care protocols, scheduled due services, and updated immunizations.  Complete Medication List: 1)  Levothyroxine Sodium 88 Mcg Tabs (Levothyroxine sodium) .... Take 1 tablet by mouth once a day 2)  Amlodipine Besylate 10 Mg Tabs (Amlodipine besylate) .... Take 1 tablet by mouth once a day 3)  Diovan 160 Mg Tabs (Valsartan) .... Take 1 tablet by mouth once a day 4)  Toprol Xl 50 Mg Xr24h-tab (Metoprolol succinate) .... Take one by mouth daily 5)  Adult Aspirin Ec Low Strength 81 Mg Tbec (Aspirin) .... Take 1 tablet by mouth once a day 6)  Furosemide 40 Mg Tabs (Furosemide) .... Take 1 tablet by mouth every morning 7)  Advil 200 Mg Tabs (Ibuprofen) .... Take one table by mouth twice daily as needed  Hypertension Assessment/Plan:      The patient's hypertensive risk group is category B: At least one risk factor (  excluding diabetes) with no target organ damage.  Her calculated 10 year risk of coronary heart disease is 9 %.  Today's blood pressure is 124/70.  Her blood pressure goal is < 140/90.   Patient Instructions: 1)  Please schedule a follow-up appointment in 6 months  CPX 2)  BMP prior to visit, ICD-9: 272.0, 244.9 3)  Hepatic Panel prior to visit ICD-9:  4)  Lipid panel prior to visit ICD-9 :  5)  TSH prior to visit ICD-9 :  6)  vit D 780.79 7)  Call insurance about shingles vaccine.     Current Allergies (reviewed today): ! DEMEROL

## 2010-05-19 NOTE — Assessment & Plan Note (Signed)
Summary: 6 m f/u dlo   Vital Signs:  Patient profile:   77 year old female Height:      64.5 inches Weight:      191.75 pounds BMI:     32.52 Temp:     98.4 degrees F oral Pulse rate:   72 / minute Pulse rhythm:   regular BP sitting:   128 / 72  (left arm) Cuff size:   regular  Vitals Entered By: Benny Lennert CMA Duncan Dull) (March 24, 2010 10:57 AM)  History of Present Illness: Chief complaint 6 month follow up   Doing well overall. Uses cane. No recent falls.    Hypertension History:       She denies headache, chest pain, palpitations, orthopnea, and side effects from treatment.  Not checking a t home.   Stable sweling peripherally on diueretic. Marland Kitchen        Positive major cardiovascular risk factors include female age 75 years old or older and hypertension.  Negative major cardiovascular risk factors include non-tobacco-user status.    Saw surgeon for hx of breast cancer...Dr. Ezzard Standing..can space these visits to every 2 years.  Intermittant anxiety.. interferes with sleep.. she has used clorazepate  at beftime about once a month, , most frequently would be once a week.  No symptoms the following day.   Problems Prior to Update: 1)  Corneal Abrasion, Left  (ICD-918.1) 2)  Unspecified Vitamin D Deficiency  (ICD-268.9) 3)  Prediabetes  (ICD-790.29) 4)  Chronic Obstructive Pulmonary Disease  (ICD-496) 5)  Insomnia, Chronic  (ICD-307.42) 6)  Back Pain, Lumbar  (ICD-724.2) 7)  Hypertension  (ICD-401.9) 8)  Hyponatremia, Mild  (ICD-276.1) 9)  Hypothyroidism  (ICD-244.9)  Current Medications (verified): 1)  Levothyroxine Sodium 88 Mcg  Tabs (Levothyroxine Sodium) .... Take 1 Tablet By Mouth Once A Day 2)  Amlodipine Besylate 10 Mg  Tabs (Amlodipine Besylate) .... Take 1 Tablet By Mouth Once A Day 3)  Diovan 160 Mg  Tabs (Valsartan) .... Take 1 Tablet By Mouth Once A Day 4)  Toprol Xl 50 Mg Xr24h-Tab (Metoprolol Succinate) .... Take One By Mouth Daily 5)  Adult Aspirin Ec Low  Strength 81 Mg  Tbec (Aspirin) .... Take 1 Tablet By Mouth Once A Day 6)  Furosemide 40 Mg  Tabs (Furosemide) .... Take 1 Tablet By Mouth Every Morning 7)  Advil 200 Mg Tabs (Ibuprofen) .... Take One Table By Mouth Twice Daily As Needed  Allergies: 1)  ! Demerol  Past History:  Past medical, surgical, family and social histories (including risk factors) reviewed, and no changes noted (except as noted below).  Past Medical History: Reviewed history from 03/01/2008 and no changes required. Current Problems:  CHRONIC OBSTRUCTIVE PULMONARY DISEASE, MILD (ICD-496) INSOMNIA, CHRONIC (ICD-307.42) SHOULDER PAIN, RIGHT, CHRONIC (ICD-719.41) PALPITATIONS (ICD-785.1) SCREENING FOR LIPOID DISORDERS (ICD-V77.91) BACTERIAL PNEUMONIA (ICD-482.9) CANDIDIASIS, ORAL (ICD-112.0) CHRONIC OBSTRUCTIVE PULMONARY DISEASE, ACUTE EXACERBATION (ICD-491.21) VIRAL INFECTION (ICD-079.99) BACK PAIN, LUMBAR (ICD-724.2) HYPERTENSION (ICD-401.9) HYPONATREMIA, MILD (ICD-276.1) HYPOTHYROIDISM (ICD-244.9)    Past Surgical History: Reviewed history from 03/01/2007 and no changes required. 04/19/05 ABI neg, 04/19/05 LE venous dopplers nml MRI of back in2005 mild bulging discs  Family History: Reviewed history from 01/18/2007 and no changes required. father died age 67 RA mother died age 82 CVA, DM,  2 sister healthy aunts liver cancer, stomach cancer  Social History: Reviewed history from 03/01/2007 and no changes required. Married x 60 years Occupation: Former Smoker 20 pack year history (1/2 pack a day x 42  years) Alcohol use-no Drug use-no Regular exercise-no: uses cane to ambulate, unsteady on feet Diet: moderately healthy. a lot of desserts  Review of Systems General:  Denies fatigue and fever. CV:  Denies chest pain or discomfort. Resp:  Denies shortness of breath; occ congestion in AMs. GI:  Denies abdominal pain, bloody stools, constipation, and diarrhea. GU:  Denies dysuria. Psych:  Denies  anxiety and depression.  Physical Exam  General:  GEN: Well-developed,well-nourished,in no acute distress; alert,appropriate and cooperative throughout examination HEENT: Normocephalic and atraumatic without obvious abnormalities. No apparent alopecia or balding. Ears, externally no deformities PULM: Breathing comfortably in no respiratory distress EXT: No clubbing, cyanosis, or edema PSYCH: Normally interactive. Cooperative during the interview. Pleasant. Friendly and conversant. Not anxious or depressed appearing. Normal, full affect.  Nose:  External nasal examination shows no deformity or inflammation. Nasal mucosa are pink and moist without lesions or exudates. Mouth:  Oral mucosa and oropharynx without lesions or exudates.  MMM Neck:  no carotid bruit or thyromegaly no cervical or supraclavicular lymphadenopathy  Lungs:  Normal respiratory effort, chest expands symmetrically. Lungs are clear to auscultation, no crackles or wheezes. Heart:  Normal rate and regular rhythm. S1 and S2 normal without gallop, murmur, click, rub or other extra sounds. Abdomen:  Bowel sounds positive,abdomen soft and non-tender without masses, organomegaly or hernias noted. Pulses:  R and L posterior tibial pulses are full and equal bilaterally  Extremities:  no edema Skin:  Intact without suspicious lesions or rashes Psych:  Cognition and judgment appear intact. Alert and cooperative with normal attention span and concentration. No apparent delusions, illusions, hallucinations   Impression & Recommendations:  Problem # 1:  HYPERTENSION (ICD-401.9) Well controlled. Continue current medication.  Her updated medication list for this problem includes:    Amlodipine Besylate 10 Mg Tabs (Amlodipine besylate) .Marland Kitchen... Take 1 tablet by mouth once a day    Diovan 160 Mg Tabs (Valsartan) .Marland Kitchen... Take 1 tablet by mouth once a day    Toprol Xl 50 Mg Xr24h-tab (Metoprolol succinate) .Marland Kitchen... Take one by mouth daily     Furosemide 40 Mg Tabs (Furosemide) .Marland Kitchen... Take 1 tablet by mouth every morning  Problem # 2:  CHRONIC OBSTRUCTIVE PULMONARY DISEASE (ICD-496) Stable. Mucinex for congestion. Pt left before I realize spiriva off her med list...will call to restart or start Advair if did not tolerated spiriva.   Problem # 3:  INSOMNIA, CHRONIC (ICD-307.42) Refilled clorazepate.   Complete Medication List: 1)  Levothyroxine Sodium 88 Mcg Tabs (Levothyroxine sodium) .... Take 1 tablet by mouth once a day 2)  Amlodipine Besylate 10 Mg Tabs (Amlodipine besylate) .... Take 1 tablet by mouth once a day 3)  Diovan 160 Mg Tabs (Valsartan) .... Take 1 tablet by mouth once a day 4)  Toprol Xl 50 Mg Xr24h-tab (Metoprolol succinate) .... Take one by mouth daily 5)  Adult Aspirin Ec Low Strength 81 Mg Tbec (Aspirin) .... Take 1 tablet by mouth once a day 6)  Furosemide 40 Mg Tabs (Furosemide) .... Take 1 tablet by mouth every morning 7)  Advil 200 Mg Tabs (Ibuprofen) .... Take one table by mouth twice daily as needed 8)  Clorazepate Dipotassium 3.75 Mg Tabs (Clorazepate dipotassium) .Marland Kitchen.. 1t ab pp at bedtime as needed insomnia  Patient Instructions: 1)  Please schedule a follow-up appointment in 6 months CPX. 2)  Fasting lipids, CMET Dx 272.0,TSH, Dx 244.9 vit D Dx 268.9 3)   Mucinex..no decongestant.. for congestion two times a day. Prescriptions: CLORAZEPATE  DIPOTASSIUM 3.75 MG TABS (CLORAZEPATE DIPOTASSIUM) 1t ab pp at bedtime as needed insomnia  #30 x 0   Entered and Authorized by:   Kerby Nora MD   Signed by:   Kerby Nora MD on 03/24/2010   Method used:   Print then Give to Patient   RxID:   [id number]    Orders Added: 1)  Est. Patient Level IV [14782]    Current Allergies (reviewed today): ! DEMEROL  Appended Document: 6 m f/u dlo Please call pt... she left before I realized Spiriva was take off her med list...why is she not taking for COPD ..to prevent acute infection and exacerbation. if did  not tolerate we can try Advair...but i would recommend something.   Appended Document: 6 m f/u dlo Patient would like to try advair.Consuello Masse CMA    Appended Document: Orders Update Clarification..pt stopped spiriva because minimal symptomatic improvement and too expensive.  Will start advair 250/50 Kerby Nora MD  March 24, 2010 1:59 PM   Clinical Lists Changes  Medications: Added new medication of ADVAIR DISKUS 250-50 MCG/DOSE AEPB (FLUTICASONE-SALMETEROL) 1 inh two times a day - Signed Rx of ADVAIR DISKUS 250-50 MCG/DOSE AEPB (FLUTICASONE-SALMETEROL) 1 inh two times a day;  #1 x 11;  Signed;  Entered by: Kerby Nora MD;  Authorized by: Kerby Nora MD;  Method used: Electronically to [facility] [address], [place], [place]  [address], Ph: [phone], Fax: [phone]    Prescriptions: ADVAIR DISKUS 250-50 MCG/DOSE AEPB (FLUTICASONE-SALMETEROL) 1 inh two times a day  #1 x 11   Entered and Authorized by:   Kerby Nora MD   Signed by:   Kerby Nora MD on 03/24/2010   Method used:   Electronically to        [facility]* (retail)       [address]       [place], [place]  [address]       Ph: [phone]       Fax: [phone]   RxID:   [id number]

## 2010-05-21 NOTE — Progress Notes (Signed)
Summary: does pt need to follow up?  Phone Note Call from Patient Call back at Home Phone [phone]   Caller: Patient Call For: Kerby Nora MD Summary of Call: Pt has been treated for UTI.  She finished her antibiotic this morning and is asking if she needs to follow up with you.  She is feeling fine. Initial call taken by: Lowella Petties CMA, AAMA,  April 30, 2010 9:09 AM  Follow-up for Phone Call        If no further symptoms.. no further follow up needed until regulalr appt.  Follow-up by: Kerby Nora MD,  April 30, 2010 10:12 PM  Additional Follow-up for Phone Call Additional follow up Details #1::        Patient advised.Consuello Masse CMA   Additional Follow-up by: Benny Lennert CMA Duncan Dull),  May 01, 2010 10:01 AM

## 2010-05-21 NOTE — Assessment & Plan Note (Signed)
Summary: ? UTI   Vital Signs:  Patient profile:   77 year old female Height:      64.5 inches Weight:      191 pounds BMI:     32.40 Temp:     97.9 degrees F oral Pulse rate:   72 / minute Pulse rhythm:   regular BP sitting:   140 / 80  (left arm) Cuff size:   large  Vitals Entered By: Linde Gillis CMA Duncan Dull) (April 23, 2010 11:33 AM) CC: ? UTI   History of Present Illness: 77 yo here for ? UTI.  Has had some dysuria x 1 week. No hematuria that she is aware of. No increased frequency. Felt a little "feverish" last night. No nausea, no vomiting. No back pain.     Current Medications (verified): 1)  Levothyroxine Sodium 88 Mcg  Tabs (Levothyroxine Sodium) .... Take 1 Tablet By Mouth Once A Day 2)  Amlodipine Besylate 10 Mg  Tabs (Amlodipine Besylate) .... Take 1 Tablet By Mouth Once A Day 3)  Diovan 160 Mg  Tabs (Valsartan) .... Take 1 Tablet By Mouth Once A Day 4)  Toprol Xl 50 Mg Xr24h-Tab (Metoprolol Succinate) .... Take One By Mouth Daily 5)  Adult Aspirin Ec Low Strength 81 Mg  Tbec (Aspirin) .... Take 1 Tablet By Mouth Once A Day 6)  Furosemide 40 Mg  Tabs (Furosemide) .... Take 1 Tablet By Mouth Every Morning 7)  Advil 200 Mg Tabs (Ibuprofen) .... Take One Table By Mouth Twice Daily As Needed 8)  Clorazepate Dipotassium 3.75 Mg Tabs (Clorazepate Dipotassium) .Marland Kitchen.. 1t Ab Pp At Bedtime As Needed Insomnia 9)  Advair Diskus 250-50 Mcg/dose Aepb (Fluticasone-Salmeterol) .Marland Kitchen.. 1 Inh Two Times A Day 10)  Cipro 500 Mg Tabs (Ciprofloxacin Hcl) .Marland Kitchen.. 1 By Mouth 2 Times Daily X 7 Days  Allergies: 1)  ! Demerol  Past History:  Past Medical History: Last updated: 03/01/2008 Current Problems:  CHRONIC OBSTRUCTIVE PULMONARY DISEASE, MILD (ICD-496) INSOMNIA, CHRONIC (ICD-307.42) SHOULDER PAIN, RIGHT, CHRONIC (ICD-719.41) PALPITATIONS (ICD-785.1) SCREENING FOR LIPOID DISORDERS (ICD-V77.91) BACTERIAL PNEUMONIA (ICD-482.9) CANDIDIASIS, ORAL (ICD-112.0) CHRONIC OBSTRUCTIVE  PULMONARY DISEASE, ACUTE EXACERBATION (ICD-491.21) VIRAL INFECTION (ICD-079.99) BACK PAIN, LUMBAR (ICD-724.2) HYPERTENSION (ICD-401.9) HYPONATREMIA, MILD (ICD-276.1) HYPOTHYROIDISM (ICD-244.9)    Past Surgical History: Last updated: 03/01/2007 04/19/05 ABI neg, 04/19/05 LE venous dopplers nml MRI of back in2005 mild bulging discs  Family History: Last updated: 02/02/2007 father died age 85 RA mother died age 37 CVA, DM,  2 sister healthy aunts liver cancer, stomach cancer  Social History: Last updated: 03/01/2007 Married x 60 years Occupation: Former Smoker 20 pack year history (1/2 pack a day x 42 years) Alcohol use-no Drug use-no Regular exercise-no: uses cane to ambulate, unsteady on feet Diet: moderately healthy. a lot of desserts  Risk Factors: Alcohol Use: 0 (05/19/2009) Exercise: no (05/19/2009)  Risk Factors: Smoking Status: never (05/19/2009)  Review of Systems      See HPI General:  Complains of fever. GI:  Complains of bloody stools; denies nausea and vomiting. GU:  Complains of dysuria; denies incontinence, nocturia, urinary frequency, and urinary hesitancy.  Physical Exam  General:  GEN: Well-developed,well-nourished,in no acute distress; alert,appropriate and cooperative throughout examination, afebrile, non toxic appearing Mouth:  MMM Abdomen:  pos suprapubic tenderness. NO CVA tenderness.   Psych:  Cognition and judgment appear intact. Alert and cooperative with normal attention span and concentration. No apparent delusions, illusions, hallucinations   Impression & Recommendations:  Problem # 1:  UTI (ICD-599.0)  Assessment New UA pos for UTI. Given associated symptoms, will treat for complicated cystitis with cipro x 7 days. Send for culture.   Her updated medication list for this problem includes:    Cipro 500 Mg Tabs (Ciprofloxacin hcl) .Marland Kitchen... 1 by mouth 2 times daily x 7 days  Complete Medication List: 1)  Levothyroxine Sodium 88 Mcg Tabs  (Levothyroxine sodium) .... Take 1 tablet by mouth once a day 2)  Amlodipine Besylate 10 Mg Tabs (Amlodipine besylate) .... Take 1 tablet by mouth once a day 3)  Diovan 160 Mg Tabs (Valsartan) .... Take 1 tablet by mouth once a day 4)  Toprol Xl 50 Mg Xr24h-tab (Metoprolol succinate) .... Take one by mouth daily 5)  Adult Aspirin Ec Low Strength 81 Mg Tbec (Aspirin) .... Take 1 tablet by mouth once a day 6)  Furosemide 40 Mg Tabs (Furosemide) .... Take 1 tablet by mouth every morning 7)  Advil 200 Mg Tabs (Ibuprofen) .... Take one table by mouth twice daily as needed 8)  Clorazepate Dipotassium 3.75 Mg Tabs (Clorazepate dipotassium) .Marland Kitchen.. 1t ab pp at bedtime as needed insomnia 9)  Advair Diskus 250-50 Mcg/dose Aepb (Fluticasone-salmeterol) .Marland Kitchen.. 1 inh two times a day 10)  Cipro 500 Mg Tabs (Ciprofloxacin hcl) .Marland Kitchen.. 1 by mouth 2 times daily x 7 days  Other Orders: UA Dipstick w/o Micro (manual) (16109) T-Culture, Urine ([id number]) Specimen Handling (99000) Prescriptions: CIPRO 500 MG TABS (CIPROFLOXACIN HCL) 1 by mouth 2 times daily x 7 days  #14 x 0   Entered and Authorized by:   Ruthe Mannan MD   Signed by:   Ruthe Mannan MD on 04/23/2010   Method used:   Electronically to        [facility]* (retail)       [address]       [place], [place]  [address]       Ph: [phone]       Fax: [phone]   RxID:   [id number]    Orders Added: 1)  UA Dipstick w/o Micro (manual) [81002] 2)  T-Culture, Urine [[id number]] 3)  Specimen Handling [99000] 4)  Est. Patient Level III [59563]    Current Allergies (reviewed today): ! DEMEROL  Laboratory Results   Urine Tests  Date/Time Received: April 23, 2010 11:50 AM   Routine Urinalysis   Color: yellow Appearance: Clear Glucose: negative   (Normal Range: Negative) Bilirubin: negative   (Normal Range: Negative) Ketone: negative   (Normal Range: Negative) Spec. Gravity: 1.020   (Normal Range: 1.003-1.035) Blood: large    (Normal Range: Negative) pH: 5.0   (Normal Range: 5.0-8.0) Protein: 30   (Normal Range: Negative) Urobilinogen: 0.2   (Normal Range: 0-1) Nitrite: negative   (Normal Range: Negative) Leukocyte Esterace: large   (Normal Range: Negative)

## 2010-09-01 NOTE — Assessment & Plan Note (Signed)
[facility] HEALTHCARE                                 ON-CALL NOTE   NAME:CAUSEYColton, Engdahl                     MRN:          [id number]  DATE:12/25/2007                            DOB:          1924-04-16    DATE OF INTERACTION:  December 25, 2007, at 4:31 p.m.   PHONE NUMBER:  [phone].   OBJECTIVE:  The patient was seen Tuesday of last week for bronchitis,  put on prednisone and azithromycin and an inhaler.  She is taking all of  the medications and felt well on Friday, Saturday felt poorly, and  Sunday felt well again.  Now today is having coughing and tightness in  her chest.  In the past, she has been given shots that she says are  antibiotics and then seen in [facility].  She has been using an  inhaler today and sounds minimally short of breath when being seen.   OBJECTIVE:  Presumed bronchitis.   PLAN:  Antibiotics, the Zithromax are still helping her.  It has not yet  been a week.  I would tell her to use the inhaler today, and if she is  still not better tomorrow morning, call for an appointment to be seen  early in the day.  Primary care Danna Sewell is Dr. Ermalene Searing and home office  is [facility].     Arta Silence, MD  Electronically Signed    RNS/MedQ  DD: 12/25/2007  DT: 12/25/2007  Job #: [id number]

## 2010-09-01 NOTE — Assessment & Plan Note (Signed)
[facility] HEALTHCARE                                 ON-CALL NOTE   NAME:[name], [name]                       MRN:          [id number]  DATE:05/20/2007                            DOB:          08/12/1923    TIME OF CALL:  2:44 p.m.   PHONE NUMBER:  [phone].   PROGRESS NOTE:  Regular doctor is Dr. Ermalene Searing.   The call was taken by Jewel Baize of the [facility]. The  patient says that she was seen for a URI. She is still having lots of  coughing. She only has 2 days left of the Zithromax pack, which means  she has already taken 3 days. She is not feeling better. The patient was  advised to give the Zithromax more time to work but if her cough  increases, she develops fever, or gets worse in any way to call back and  to followup in the office if she is not improved next week, to go ahead  and finish the Z-pack.     Marne A. Tower, MD  Electronically Signed    MAT/MedQ  DD: 05/20/2007  DT: 05/21/2007  Job #: [id number]

## 2010-09-04 NOTE — Op Note (Signed)
   NAMEANNALEIGHA, WOO                        ACCOUNT NO.:  [id number]   MEDICAL RECORD NO.:  [id number]                   PATIENT TYPE:  AMB   LOCATION:  ENDO                                 FACILITY:  [facility]   PHYSICIAN:  Charolett Bumpers, M.D.             DATE OF BIRTH:  Jan 25, 1924   DATE OF PROCEDURE:  DATE OF DISCHARGE:  11/16/2001                                 OPERATIVE REPORT   INDICATIONS FOR PROCEDURE:  Ms. Lizzett Nobile is a 77 year old female born  Sep 07, 1923.  Ms. Jenne 26 year old sister recently underwent a  colonoscopy, and colon polyps were removed.  Ms. Surles has a left lower  quadrant abdominal-pelvic pain with chronic constipation.  Her June 2003  abdominal ultrasound showed gallstones; her pelvic ultrasound showed a 1 cm  calcification in the left ovary.   Ms. Kroner is scheduled to undergo her first screening colonoscopy with  polypectomy to prevent colon cancer.  I discussed with her the complications  associated with colonoscopy and polypectomy including a 15/1000 risk of  bleeding and 4 per 1000 risk of colon perforation requiring surgical repair.  Ms. Guerrieri has signed the operative permit.   ENDOSCOPIST:  Charolett Bumpers, M.D.   PREMEDICATION:  Versed 5 mg, fentanyl 50 mcg.   ENDOSCOPE:  Olympus pediatric colonoscope.   DESCRIPTION OF PROCEDURE:  After obtaining informed consent, Ms. Kewley was  placed in the left lateral decubitus position.  I administered intravenous  fentanyl and intravenous Versed to achieve conscious sedation for the  procedure.  The patient's blood pressure, oxygen saturation, and cardiac  rhythm were monitored throughout the procedure and documented in the medical  record.   Anal inspection was normal.  Digital rectal exam was normal.  The Olympus  pediatric video colonoscope was introduced into the rectum and advanced to  the cecum.  Colonic preparation for the exam today was excellent.   Ms. Flanagin has melanosis  coli and also scattered diverticulum throughout the  colon.   Rectum:  Normal.   Sigmoid Colon and Descending Colon:  Normal.   Splenic Flexure:  Normal.   Transverse Colon:  Normal.   Hepatic Flexure:  Normal.   Ascending Colon:  Normal.   Cecum and Ileocecal Valve:  Normal.    ASSESSMENT:  1. Melanosis coli.  2. Scattered universal colonic diverticulosis.  3. No endoscopic evidence of the presence of colorectal neoplasia.                                               Charolett Bumpers, M.D.    MKJ/MEDQ  D:  11/16/2001  T:  11/21/2001  Job:  [id number]   cc:   Barbette Hair. Merril Abbe, M.D.

## 2010-09-15 ENCOUNTER — Encounter: Payer: Self-pay | Admitting: Family Medicine

## 2010-09-16 ENCOUNTER — Encounter: Payer: Self-pay | Admitting: Family Medicine

## 2010-09-16 ENCOUNTER — Ambulatory Visit (INDEPENDENT_AMBULATORY_CARE_PROVIDER_SITE_OTHER): Payer: Medicare Other | Admitting: Family Medicine

## 2010-09-16 VITALS — BP 126/70 | HR 80 | Temp 98.4°F | Wt 190.1 lb

## 2010-09-16 DIAGNOSIS — M79609 Pain in unspecified limb: Secondary | ICD-10-CM

## 2010-09-16 DIAGNOSIS — M79605 Pain in left leg: Secondary | ICD-10-CM

## 2010-09-16 NOTE — Assessment & Plan Note (Signed)
In h/o lumbago and likely arthritis. Exam normal today. Anticipate stemming from lower back. Continue NSAID or tylenol (pt thinks NSAID works better), provided with stretching exercises from [facility] pt advisor on lumbago.  May use ice. Update [place] if worsening.

## 2010-09-16 NOTE — Patient Instructions (Addendum)
I think this leg pain may be coming from lower back. May continue advil or tylenol. Try stretching exercises provided today. Update [place] if worsening.

## 2010-09-16 NOTE — Progress Notes (Signed)
  Subjective:    Patient ID: Diana [name], female    DOB: January 05, 1924, 77 y.o.   MRN: [id number]  HPI CC: L leg pain  77yo new to me with h/o lumbago, hypothyroid, HTN, COPD presents with left leg pain.  Leg problems for several years.  Has had scans and ultrasounds, MRI, cards eval, never found anything wrong according to patient.  Hasn't had any recent workup in last few years.   When stands for prolonged periods, starts having leg pain - mainly left lateral leg, pain described as burning.  Sitting down improves pain as well as elevating leg.  Worried about blood clot.  Monday morning bad pain because was on feet for several hours in am.  Yesterday was seated most of day.  Today much better.  advil helps pain.  No pain with walking.  No swelling in legs.  No CP/tightness, SOB.  Does have back pain that travels to legs, takes advil intermittently.  Uses girdle regularly.  Medications and allergies reviewed and updated in chart. PMhx reviewed.  Review of Systems per HPI    Objective:   Physical Exam  Nursing note and vitals reviewed. Constitutional: She appears well-developed and well-nourished. No distress.  Cardiovascular: Normal rate, regular rhythm, normal heart sounds and intact distal pulses.   No murmur heard. Pulmonary/Chest: Breath sounds normal. No respiratory distress. She has no wheezes. She has no rales.  Musculoskeletal: She exhibits no edema and no tenderness.       Somewhat imbalanced with position changes, needs assistance to get on exam table. No midline spine tenderness. Neg SLR bilaterally, FROM of hips without pain in int/ext rotation. No palp cords, no edema, no erythema.  Skin: Skin is warm and dry. No rash noted.  Psychiatric: She has a normal mood and affect.          Assessment & Plan:

## 2010-10-14 ENCOUNTER — Encounter: Payer: Self-pay | Admitting: Family Medicine

## 2010-10-14 ENCOUNTER — Ambulatory Visit (INDEPENDENT_AMBULATORY_CARE_PROVIDER_SITE_OTHER): Payer: Medicare Other | Admitting: Family Medicine

## 2010-10-14 VITALS — BP 130/82 | HR 68 | Temp 98.6°F | Ht 64.0 in | Wt 190.0 lb

## 2010-10-14 DIAGNOSIS — L03211 Cellulitis of face: Secondary | ICD-10-CM

## 2010-10-14 MED ORDER — CEFTRIAXONE SODIUM 250 MG IJ SOLR
1000.0000 mg | Freq: Once | INTRAMUSCULAR | Status: AC
  Administered 2010-10-14: 1000 mg via INTRAMUSCULAR

## 2010-10-14 MED ORDER — CEPHALEXIN 500 MG PO CAPS: 500.0000 mg | ORAL_CAPSULE | Freq: Four times a day (QID) | ORAL | Status: AC

## 2010-10-14 NOTE — Progress Notes (Signed)
  Subjective:    Patient ID: Diana [name], female    DOB: 01/12/1924, 77 y.o.   MRN: [id number]  HPI  Diana [name], a 77 y.o. female presents today in the office for the following:    Presents with 1 week history of worsening L facial redness, pain. Started with a "pimple" - saw Dr. Margo Aye last week, placed on Doxy, but has worsened. Pimple / boil has gone, increasing redness.  No systemic fever or chills  The PMH, PSH, Social History, Family History, Medications, and allergies have been reviewed in [facility], and have been updated if relevant.   Review of Systems ROS: GEN: Acute illness details above GI: Tolerating PO intake GU: maintaining adequate hydration and urination Pulm: No SOB Interactive and getting along well at home.  Otherwise, ROS is as per the HPI.     Objective:   Physical Exam  Constitutional: She is oriented to person, place, and time. She appears well-developed and well-nourished.  HENT:  Head: Normocephalic and atraumatic.  Pulmonary/Chest: Effort normal.  Neurological: She is alert and oriented to person, place, and time.  Skin: Skin is warm. Rash noted. No bruising and no burn noted. She is not diaphoretic. There is erythema. No pallor.     Psychiatric: She has a normal mood and affect. Her behavior is normal. Judgment and thought content normal.          Assessment & Plan:  Cellulitis: Rocephin 1 gram IM now Broaden tomorrow to Keflex 500 mg qid + doxy  Call Friday if worsening or not improving

## 2010-10-14 NOTE — Patient Instructions (Signed)
Cellulitis Cellulitis is an infection of the tissue under the skin. The infected area is usually red and tender. This is caused by germs (bacteria). These germs enter the body through cuts or sores. This usually happens in the arms or lower legs.  HOME CARE  Take medicine (antibiotics) the way your doctor told you to. Take it until it is gone.   Only take over-the-counter medicine as told by your doctor.   If the infection is on the arm or leg, keep the limb raised.   Use a warm cloth on the infected area several times a day to:   Help with pain.   Help with healing.   See your doctor for a follow-up visit as told.  GET HELP RIGHT AWAY IF:   You or your child is tired or confused.   You or your child is throwing up (vomiting).   You or your child is having watery poop (diarrhea).   You or your child feels ill and has muscle aches.   You or your child has a temperature by mouth above 102, not controlled by medicine.   Your baby is older than 3 months with a rectal temperature of 102 F (38.9 C) or higher.   Your baby is 5 months old or younger with a rectal temperature of 100.4 F (38 C) or higher.  MAKE SURE YOU:   Understand these instructions.   Will watch this condition.   Will get help right away if you or your child is not doing well or gets worse.  Document Released: 09/22/2007 Document Re-Released: 06/30/2009 [facility] Patient Information 2011 [place], [place].

## 2010-10-14 NOTE — Progress Notes (Signed)
Addended by: Consuello Masse on: 10/14/2010 01:05 PM   Modules accepted: Orders

## 2010-10-16 ENCOUNTER — Ambulatory Visit (INDEPENDENT_AMBULATORY_CARE_PROVIDER_SITE_OTHER): Payer: Medicare Other | Admitting: Family Medicine

## 2010-10-16 ENCOUNTER — Encounter: Payer: Self-pay | Admitting: Family Medicine

## 2010-10-16 DIAGNOSIS — L03211 Cellulitis of face: Secondary | ICD-10-CM

## 2010-10-16 NOTE — Patient Instructions (Signed)
Cetaphil cream for moisturizing face gently. Complete antibiotics.  Call if redness not continuing to improve or new onset fever.

## 2010-10-16 NOTE — Assessment & Plan Note (Signed)
Improved on broadened antibiotic regimen.  Continue to follow, call if redness swelling or fever.

## 2010-10-16 NOTE — Progress Notes (Signed)
  Subjective:    Patient ID: Diana [name], female    DOB: February 12, 1924, 77 y.o.   MRN: [id number]  HPI Diagnosed  with cellulitis on face 1 week ago. Started on doxyccyline.  See on 6/26 by Dr. Patsy Lager for worsened symptoms. Given Rocephin IM and started on Keflex.  She has noted significant improvement. Less swelling, less soreness. No fever, no shortness of breath. No eye pain, no diffioculty moving eye.    Review of Systems  Constitutional: Negative for fever and fatigue.  HENT: Negative for congestion.   Eyes: Negative for pain.  Respiratory: Negative for shortness of breath.   Cardiovascular: Negative for chest pain.       Objective:   Physical Exam  Constitutional: She appears well-developed and well-nourished. No distress.  HENT:  Head: Normocephalic and atraumatic.  Right Ear: External ear normal.  Mouth/Throat: Oropharynx is clear and moist.  Eyes: Conjunctivae and EOM are normal. Pupils are equal, round, and reactive to light.  Neck: Normal range of motion. Neck supple.  Cardiovascular: Normal rate, regular rhythm, normal heart sounds and intact distal pulses.  Exam reveals no gallop and no friction rub.   No murmur heard. Pulmonary/Chest: Effort normal and breath sounds normal. No respiratory distress. She has no wheezes. She has no rales. She exhibits no tenderness.  Skin: She is not diaphoretic.       Redness oin left cheek... Approximately 1.5 inch diameter, less red, swollen and pain per pt          Assessment & Plan:

## 2010-10-20 ENCOUNTER — Encounter: Payer: Self-pay | Admitting: Family Medicine

## 2010-10-20 ENCOUNTER — Ambulatory Visit (INDEPENDENT_AMBULATORY_CARE_PROVIDER_SITE_OTHER): Payer: Medicare Other | Admitting: Family Medicine

## 2010-10-20 VITALS — BP 136/74 | HR 76 | Temp 98.6°F | Wt 190.1 lb

## 2010-10-20 DIAGNOSIS — L03211 Cellulitis of face: Secondary | ICD-10-CM

## 2010-10-20 DIAGNOSIS — L0201 Cutaneous abscess of face: Secondary | ICD-10-CM

## 2010-10-20 NOTE — Patient Instructions (Signed)
Finish antibiotics. Try warm compresses to speed up resolution of infection. Watch out for fever >101 or worsening redness or swelling or spreading.  If that happens, let us know. Good to see you today!  Call us with questions.

## 2010-10-20 NOTE — Progress Notes (Signed)
  Subjective:    Patient ID: Diana [name], female    DOB: 1923-08-12, 77 y.o.   MRN: [id number]  HPI Cc: f/u cellulitis  Presents with sister, Cammie Sickle.  Seen 6/27 and again 6/29 with concern for facial cellulitis.  Notes reviewed.  Initially seen by derm for this (Dr. Margo Aye).  Started on doxy.  Saw Dr. Patsy Lager and given IM rocephin x 1 as well as added on keflex to doxy.  Thinks antibiotics started 10/14/2010, 10 day course.  Feeling better.  Given tomorrow is holiday, wanted to get checked out.  Wonders if needs shot to heal quicker.  Feeling burning on skin, but better than prior.  No pain/soreness. No fevers/chills, shortness of breath.  No pain with eye movements or facial swelling.  H/o rosacea.  Review of Systems Per HPI    Objective:   Physical Exam  Nursing note and vitals reviewed. Constitutional: She appears well-developed and well-nourished. No distress.  HENT:  Head: Normocephalic and atraumatic.  Mouth/Throat: Oropharynx is clear and moist. No oropharyngeal exudate.       Left cheek with mild erythema about 1.5 in diameter, improved per pt  Eyes: Conjunctivae and EOM are normal. Pupils are equal, round, and reactive to light. No scleral icterus.  Neck: Normal range of motion. Neck supple.  Lymphadenopathy:       Head (right side): No submental, no submandibular, no tonsillar, no preauricular and no posterior auricular adenopathy present.       Head (left side): No submental, no submandibular, no tonsillar, no preauricular and no posterior auricular adenopathy present.    She has no cervical adenopathy.  Skin: Skin is warm and dry. There is erythema (see above).          Assessment & Plan:

## 2010-10-20 NOTE — Assessment & Plan Note (Signed)
Overall improving. Continue course. Recommend warm compreses to see if expedite resolution. No need for shot today. Update if red flags, reviewed.

## 2010-10-26 ENCOUNTER — Ambulatory Visit (INDEPENDENT_AMBULATORY_CARE_PROVIDER_SITE_OTHER): Payer: Medicare Other | Admitting: Family Medicine

## 2010-10-26 ENCOUNTER — Encounter: Payer: Self-pay | Admitting: Family Medicine

## 2010-10-26 DIAGNOSIS — L03211 Cellulitis of face: Secondary | ICD-10-CM

## 2010-10-26 DIAGNOSIS — L0201 Cutaneous abscess of face: Secondary | ICD-10-CM

## 2010-10-26 MED ORDER — GABAPENTIN 100 MG PO CAPS: ORAL_CAPSULE | ORAL | Status: DC

## 2010-10-26 MED ORDER — PREDNISONE 10 MG PO TABS: ORAL_TABLET | ORAL | Status: DC

## 2010-10-26 NOTE — Progress Notes (Signed)
  Subjective:    Patient ID: Diana [name], female    DOB: 1924/04/08, 77 y.o.   MRN: [id number]  HPI  77 year old femael initially seen end of June for presumed cellulitis... S/p rochepin and keflex and doxycycline.  per pt . She has completed antibitoics given here but remains on the 2 moths course of doxy given to her for ? roceacea by Dr. Margo Aye when issue was first beginning. She continues to have fairly significant pain in left cheek.. More of a burning now. Keeps her up at night. Now burning in upper lip and on chin. No fever ever. Area never came to a head etc. No blisters seen. No vision changes.  She has received shingles vaccine in past.    Review of Systems  Constitutional: Negative for fever and fatigue.  HENT: Negative for ear pain.   Eyes: Negative for pain.  Respiratory: Negative for shortness of breath and wheezing.   Cardiovascular: Negative for chest pain, palpitations and leg swelling.  Gastrointestinal: Negative for abdominal pain, diarrhea and constipation.       Objective:   Physical Exam  Constitutional: She appears well-developed and well-nourished.  Eyes: Conjunctivae and EOM are normal. Pupils are equal, round, and reactive to light.  Neck: Normal range of motion. Neck supple.  Cardiovascular: Normal rate, regular rhythm and normal heart sounds.   Pulmonary/Chest: Effort normal and breath sounds normal.  Abdominal: Soft. Bowel sounds are normal.  Skin: Skin is warm and dry. Rash noted.       erythematous rash (diffuse macule) and hypersensitivity on left cheek below eye, no clear blisters. Papular eyrthematous rash around mouth on chin and upper lip... This part is itchy per pt          Assessment & Plan:

## 2010-10-26 NOTE — Assessment & Plan Note (Addendum)
Rash at this point does seem fairly characteristic of possible shingles.  There is and element of papular bumps around mouth that crosses midline though.  There is some itchiness that was not there that could be skin irritation to doxycycline/sun exposure.  She is out of time course for antiviral unfortunately... We will start neurontin for pain and will add lower dose prednisone taper to cover and allergic component and to help with pain from possible shingles.  Hold doxycycline. Close follow up in 3-4 days.

## 2010-10-26 NOTE — Assessment & Plan Note (Signed)
Rash not clearly infectious at this point... No clear abcess or fever etc.

## 2010-10-26 NOTE — Patient Instructions (Signed)
Call to see eye MD ASAP for eye eval for shingles.

## 2010-10-27 ENCOUNTER — Other Ambulatory Visit: Payer: Medicare Other

## 2010-10-30 ENCOUNTER — Encounter: Payer: Self-pay | Admitting: Family Medicine

## 2010-10-30 ENCOUNTER — Ambulatory Visit (INDEPENDENT_AMBULATORY_CARE_PROVIDER_SITE_OTHER): Payer: Medicare Other | Admitting: Family Medicine

## 2010-10-30 DIAGNOSIS — R51 Headache: Secondary | ICD-10-CM

## 2010-10-30 DIAGNOSIS — B3731 Acute candidiasis of vulva and vagina: Secondary | ICD-10-CM | POA: Insufficient documentation

## 2010-10-30 DIAGNOSIS — B373 Candidiasis of vulva and vagina: Secondary | ICD-10-CM

## 2010-10-30 MED ORDER — GABAPENTIN 100 MG PO CAPS: ORAL_CAPSULE | ORAL | Status: DC

## 2010-10-30 MED ORDER — FLUCONAZOLE 150 MG PO TABS: 150.0000 mg | ORAL_TABLET | Freq: Once | ORAL | Status: AC

## 2010-10-30 NOTE — Progress Notes (Signed)
  Subjective:    Patient ID: Diana [name], female    DOB: [birth year], 77 y.o.   MRN: [id number]  HPI  77 year old female presents for follow up facial rash.. S/p treatment for cellulitis and now treated as presumed zoster.  She is on day 4 of steroid taper.  She has stopped doxycyline as well.  Using 100 mg at bedtime of gabapentin.  No associated SE.  She has had some vaginal soreness, no discharge, no vaginal itching from the antibiotics.  2 days ago had full eye eval... No shingles in eye.  She has less redness on left cheek, less pain but still some remaining tingling, some stinging intermittantly. 2-3/ 10 on pain scale. No redness, papules around mouth.    Review of Systems  Constitutional: Positive for fatigue. Negative for fever.  HENT: Negative for congestion.   Eyes: Negative for photophobia, pain, discharge, redness, itching and visual disturbance.  Respiratory: Negative for cough, shortness of breath and wheezing.   Cardiovascular: Negative for chest pain, palpitations and leg swelling.       Objective:   Physical Exam  Constitutional: She appears well-developed and well-nourished.  HENT:  Head: Normocephalic and atraumatic.  Right Ear: External ear normal.  Left Ear: External ear normal.  Nose: Nose normal.  Mouth/Throat: Oropharynx is clear and moist.  Eyes: Conjunctivae and EOM are normal. Pupils are equal, round, and reactive to light.  Neck: Normal range of motion. Neck supple. No thyromegaly present.  Cardiovascular: Normal rate, regular rhythm and normal heart sounds.   Pulmonary/Chest: Effort normal and breath sounds normal. No respiratory distress. She has no wheezes. She has no rales. She exhibits no tenderness.  Lymphadenopathy:    She has no cervical adenopathy.  Skin: Skin is warm and dry.       Rash on left face improved, no further papules around mouth          Assessment & Plan:

## 2010-10-30 NOTE — Assessment & Plan Note (Signed)
Improved with prednisone course and gabapentin for likely zoster complicated by skin sensitivity to sun from doxycycline. Continue gabapentin, increase for better pain control. Tgaper off after several weeks if pain improved.

## 2010-10-30 NOTE — Patient Instructions (Addendum)
Go ahead and increase gabapentin to 2 tabs at bedtime to try and get even better pain control. Complete prednisone course. If things continue to improve... It is okay to wean off gabapentin slowly as long as pain does not return.  Start diflucan for vaginal yeast infection from antibiotics. Call to schedule 6 month follow up in next few months.

## 2010-10-30 NOTE — Assessment & Plan Note (Addendum)
Likely due to recent antibiotics. Pt refused exam. Treat with oral antifungal x 1

## 2010-11-03 ENCOUNTER — Other Ambulatory Visit: Payer: Self-pay | Admitting: *Deleted

## 2010-11-03 ENCOUNTER — Encounter: Payer: Medicare Other | Admitting: Family Medicine

## 2010-11-03 MED ORDER — VALSARTAN 160 MG PO TABS: 160.0000 mg | ORAL_TABLET | Freq: Every day | ORAL | Status: DC

## 2010-11-10 ENCOUNTER — Encounter: Payer: Self-pay | Admitting: Family Medicine

## 2010-11-10 ENCOUNTER — Ambulatory Visit (INDEPENDENT_AMBULATORY_CARE_PROVIDER_SITE_OTHER): Payer: Medicare Other | Admitting: Family Medicine

## 2010-11-10 VITALS — BP 130/80 | HR 72 | Temp 98.0°F | Ht 64.0 in | Wt 190.0 lb

## 2010-11-10 DIAGNOSIS — R21 Rash and other nonspecific skin eruption: Secondary | ICD-10-CM

## 2010-11-10 DIAGNOSIS — R519 Headache, unspecified: Secondary | ICD-10-CM

## 2010-11-10 MED ORDER — TRIAMCINOLONE ACETONIDE 0.5 % EX CREA: TOPICAL_CREAM | Freq: Two times a day (BID) | CUTANEOUS | Status: DC

## 2010-11-10 MED ORDER — TRAMADOL HCL 50 MG PO TABS: 50.0000 mg | ORAL_TABLET | Freq: Four times a day (QID) | ORAL | Status: AC | PRN

## 2010-11-10 NOTE — Assessment & Plan Note (Addendum)
I am not sure what is cause of rash at this point.   Not typical for cellulitis and s/P extensive antibiotics for this with minimal improvement. Presumed shingles with allergic reaction to doxy... Now in doubt given return of rash around chin atypyical of shingles with completion of prednisone. ? Is rash is more eczema related given itching and flake. Will start steroid topical given steroid is what has helped most in this treatment course. She had SE of anxiety from oral prednisone. Continue benadryl Ibuprofen or tramadol for pain as needed. Will refer back to Derm for further eval/treatment.

## 2010-11-10 NOTE — Patient Instructions (Addendum)
Start steroid cream on face twice daily. Stop neurontin.  Okay to use benadryl for sleep at night temporarily as long as no constipation, dry mouth or urine retention. Start with ibuprofen for pain 800 mg every three hours as needed for pain. Take with meals. If pain not well controlled, stop ibuprofen and may use tramadol for pain. Call for return visit to Dr. Margo Aye.

## 2010-11-10 NOTE — Progress Notes (Signed)
  Subjective:    Patient ID: Diana [name], female    DOB: [birth year], 77 y.o.   MRN: [id number]  HPI 77 year old female with presumed   Improvement in rash with prednisone and stopping doxy at last OV, but pt report burning pain continuing despite neurontin 100 mg 1 tab at night. She tried 200 mg at night but she felt nervous feeling and ill feeling. So went back to 2 tabs daily. Using benadryl at night to help her sleep.  Rash has returned down around chin. She reports as burning itching pain.She also notes flaky skin. She also notes left ear appeared red.   HAs history of roseacea.    Review of Systems  Constitutional: Negative for fever and fatigue.  HENT: Negative for ear pain.   Eyes: Negative for pain.  Respiratory: Negative for chest tightness and shortness of breath.   Cardiovascular: Negative for chest pain, palpitations and leg swelling.  Gastrointestinal: Negative for abdominal pain.  Genitourinary: Negative for dysuria.       Objective:   Physical Exam  Constitutional: She appears well-developed and well-nourished.  Cardiovascular: Normal rate, regular rhythm, normal heart sounds and intact distal pulses.  Exam reveals no gallop and no friction rub.   No murmur heard. Pulmonary/Chest: Effort normal and breath sounds normal. No respiratory distress. She has no wheezes. She has no rales. She exhibits no tenderness.  Skin:       Erythematous nodules around jaw and heat and erythema on right cheek. Flaky dry scalle on top of erythema.          Assessment & Plan:

## 2010-11-11 ENCOUNTER — Other Ambulatory Visit: Payer: Self-pay | Admitting: *Deleted

## 2010-11-11 MED ORDER — LEVOTHYROXINE SODIUM 88 MCG PO TABS: 88.0000 ug | ORAL_TABLET | Freq: Every day | ORAL | Status: DC

## 2010-11-11 MED ORDER — METOPROLOL SUCCINATE ER 50 MG PO TB24: 50.0000 mg | ORAL_TABLET | Freq: Every day | ORAL | Status: DC

## 2010-12-22 ENCOUNTER — Other Ambulatory Visit: Payer: Self-pay | Admitting: *Deleted

## 2010-12-24 MED ORDER — CLORAZEPATE DIPOTASSIUM 3.75 MG PO TABS: 3.7500 mg | ORAL_TABLET | Freq: Every evening | ORAL | Status: DC | PRN

## 2010-12-24 MED ORDER — FUROSEMIDE 40 MG PO TABS: 40.0000 mg | ORAL_TABLET | Freq: Every day | ORAL | Status: DC

## 2011-01-08 LAB — INFLUENZA A AND B ANTIGEN (CONVERTED LAB): Inflenza A Ag: NEGATIVE

## 2011-01-29 ENCOUNTER — Other Ambulatory Visit (INDEPENDENT_AMBULATORY_CARE_PROVIDER_SITE_OTHER): Payer: Self-pay | Admitting: Surgery

## 2011-01-29 ENCOUNTER — Telehealth: Payer: Self-pay | Admitting: *Deleted

## 2011-01-29 DIAGNOSIS — Z1231 Encounter for screening mammogram for malignant neoplasm of breast: Secondary | ICD-10-CM

## 2011-01-29 DIAGNOSIS — Z9011 Acquired absence of right breast and nipple: Secondary | ICD-10-CM

## 2011-01-29 NOTE — Telephone Encounter (Signed)
Please work her in in 03/2011, any 30 min slot... Looks like she should be due in 03/2011 for AMW with fasting labs prior

## 2011-01-29 NOTE — Telephone Encounter (Signed)
Patient called to see when she needs to come back in to see Dr. Ermalene Searing.  She stated that she hasn't had a CPX or labs in some time now and was advised that Dr. Ermalene Searing is booked until February.  Please advise.

## 2011-01-29 NOTE — Telephone Encounter (Signed)
Patient advised and appt made

## 2011-02-03 ENCOUNTER — Ambulatory Visit
Admission: RE | Admit: 2011-02-03 | Discharge: 2011-02-03 | Disposition: A | Discharge status: Home / Self Care | Payer: Medicare Other | Source: Ambulatory Visit | Attending: Surgery | Admitting: Surgery

## 2011-02-03 DIAGNOSIS — Z9011 Acquired absence of right breast and nipple: Secondary | ICD-10-CM

## 2011-02-03 DIAGNOSIS — Z1231 Encounter for screening mammogram for malignant neoplasm of breast: Secondary | ICD-10-CM

## 2011-02-04 ENCOUNTER — Encounter: Payer: Self-pay | Admitting: Family Medicine

## 2011-02-04 ENCOUNTER — Ambulatory Visit (INDEPENDENT_AMBULATORY_CARE_PROVIDER_SITE_OTHER): Payer: Medicare Other | Admitting: Family Medicine

## 2011-02-04 VITALS — BP 140/70 | HR 67 | Temp 98.1°F | Ht 64.0 in | Wt 188.0 lb

## 2011-02-04 DIAGNOSIS — M79606 Pain in leg, unspecified: Secondary | ICD-10-CM

## 2011-02-04 DIAGNOSIS — M79609 Pain in unspecified limb: Secondary | ICD-10-CM

## 2011-02-04 DIAGNOSIS — M79604 Pain in right leg: Secondary | ICD-10-CM | POA: Insufficient documentation

## 2011-02-04 NOTE — Progress Notes (Signed)
  Subjective:    Patient ID: Diana [name], female    DOB: [birth year], 77 y.o.   MRN: [id number]  HPI  Pt rescheduled. MD running behind and pt could not wait, husband ill.  Review of Systems     Objective:   Physical Exam        Assessment & Plan:

## 2011-02-05 ENCOUNTER — Ambulatory Visit: Payer: Self-pay | Admitting: Family Medicine

## 2011-02-05 ENCOUNTER — Encounter: Payer: Self-pay | Admitting: Family Medicine

## 2011-02-05 ENCOUNTER — Telehealth: Payer: Self-pay | Admitting: *Deleted

## 2011-02-05 ENCOUNTER — Ambulatory Visit (INDEPENDENT_AMBULATORY_CARE_PROVIDER_SITE_OTHER): Payer: Medicare Other | Admitting: Family Medicine

## 2011-02-05 VITALS — BP 140/72 | HR 63 | Temp 98.4°F | Ht 64.0 in | Wt 188.0 lb

## 2011-02-05 DIAGNOSIS — M79609 Pain in unspecified limb: Secondary | ICD-10-CM

## 2011-02-05 DIAGNOSIS — M79604 Pain in right leg: Secondary | ICD-10-CM

## 2011-02-05 NOTE — Telephone Encounter (Signed)
Patient advised.

## 2011-02-05 NOTE — Telephone Encounter (Signed)
Notify pt no clot in leg... Treat as discussed at OV.

## 2011-02-05 NOTE — Patient Instructions (Signed)
We will call you with doppler results. If negative...elevate leg, gentle stretching and ice prn for muscle strain.

## 2011-02-05 NOTE — Progress Notes (Signed)
  Subjective:    Patient ID: Diana [name], female    DOB: 12/18/1923, 77 y.o.   MRN: [id number]  HPI  77 year old female with COPD presents with right leg pain (calf behind knee and in thigh posteriorly)and swelling in last 3-4 days. No redness. No fever, no SOB other than baseline. No recent med change. No recent car rides, plane rides. Walks a lot.   Fell few days ago... Twisted right leg... Sat on floor did not hit anything.   Review of Systems  Constitutional: Negative for fever and fatigue.  Eyes: Negative for pain.  Respiratory: Negative for shortness of breath and wheezing.   Cardiovascular: Negative for chest pain, palpitations and leg swelling.  Gastrointestinal: Negative for abdominal pain.       Objective:   Physical Exam  Constitutional: She appears well-developed and well-nourished.       Overweight with cane  Neck: Normal range of motion. Neck supple.  Cardiovascular: Normal rate, regular rhythm, normal heart sounds and intact distal pulses.  Exam reveals no gallop and no friction rub.   No murmur heard. Pulmonary/Chest: Effort normal and breath sounds normal. No respiratory distress. She has no wheezes. She has no rales. She exhibits no tenderness.  Musculoskeletal:       Right leg with tenderness in calf, no warmth, sliught redness and 1 plus pitting edema in right leg.  Nml right knee eval, no pain in knee.  Varicose veins B. No swelling left leg.          Assessment & Plan:

## 2011-02-05 NOTE — Assessment & Plan Note (Signed)
Acute... Concerning for DVT...eval with dopplers ASAP. If negative pan may be from MSK strain from recent fall.

## 2011-02-05 NOTE — Telephone Encounter (Signed)
Ultrasound of Right leg Negative for DVT

## 2011-02-08 ENCOUNTER — Encounter: Payer: Self-pay | Admitting: Family Medicine

## 2011-02-17 ENCOUNTER — Ambulatory Visit: Payer: Medicare Other

## 2011-03-22 ENCOUNTER — Telehealth: Payer: Self-pay | Admitting: Family Medicine

## 2011-03-22 DIAGNOSIS — E039 Hypothyroidism, unspecified: Secondary | ICD-10-CM

## 2011-03-22 DIAGNOSIS — E559 Vitamin D deficiency, unspecified: Secondary | ICD-10-CM

## 2011-03-22 DIAGNOSIS — R7309 Other abnormal glucose: Secondary | ICD-10-CM

## 2011-03-22 DIAGNOSIS — I10 Essential (primary) hypertension: Secondary | ICD-10-CM

## 2011-03-22 DIAGNOSIS — E871 Hypo-osmolality and hyponatremia: Secondary | ICD-10-CM

## 2011-03-22 NOTE — Telephone Encounter (Signed)
Message copied by Excell Seltzer on Mon Mar 22, 2011 10:32 AM ------      Message from: Alvina Chou      Created: Wed Mar 17, 2011  3:52 PM      Regarding: orders       Patient is scheduled for CPX labs, please order future labs, Thanks , Camelia Eng

## 2011-03-23 ENCOUNTER — Other Ambulatory Visit (INDEPENDENT_AMBULATORY_CARE_PROVIDER_SITE_OTHER): Payer: Medicare Other

## 2011-03-23 DIAGNOSIS — E039 Hypothyroidism, unspecified: Secondary | ICD-10-CM

## 2011-03-23 DIAGNOSIS — R7309 Other abnormal glucose: Secondary | ICD-10-CM

## 2011-03-23 DIAGNOSIS — I10 Essential (primary) hypertension: Secondary | ICD-10-CM

## 2011-03-23 DIAGNOSIS — E871 Hypo-osmolality and hyponatremia: Secondary | ICD-10-CM

## 2011-03-23 DIAGNOSIS — E559 Vitamin D deficiency, unspecified: Secondary | ICD-10-CM

## 2011-03-23 LAB — CBC WITH DIFFERENTIAL/PLATELET
Basophils Relative: 0.6 % (ref 0.0–3.0)
Eosinophils Relative: 3.9 % (ref 0.0–5.0)
HCT: 40.7 % (ref 36.0–46.0)
Lymphs Abs: 3.3 10*3/uL (ref 0.7–4.0)
MCV: 95 fl (ref 78.0–100.0)
Monocytes Absolute: 0.6 10*3/uL (ref 0.1–1.0)
RBC: 4.29 Mil/uL (ref 3.87–5.11)
WBC: 8.2 10*3/uL (ref 4.5–10.5)

## 2011-03-23 LAB — COMPREHENSIVE METABOLIC PANEL
Alkaline Phosphatase: 74 U/L (ref 39–117)
BUN: 12 mg/dL (ref 6–23)
Creatinine, Ser: 0.9 mg/dL (ref 0.4–1.2)
Glucose, Bld: 118 mg/dL — ABNORMAL HIGH (ref 70–99)
Total Bilirubin: 0.5 mg/dL (ref 0.3–1.2)

## 2011-03-23 LAB — LIPID PANEL
Cholesterol: 172 mg/dL (ref 0–200)
HDL: 43.6 mg/dL (ref >39.00)
VLDL: 31.4 mg/dL (ref 0.0–40.0)

## 2011-03-24 ENCOUNTER — Other Ambulatory Visit: Payer: Medicare Other

## 2011-03-24 LAB — VITAMIN D 25 HYDROXY (VIT D DEFICIENCY, FRACTURES): Vit D, 25-Hydroxy: 26 ng/mL — ABNORMAL LOW (ref 30–89)

## 2011-03-29 ENCOUNTER — Encounter: Payer: Self-pay | Admitting: Family Medicine

## 2011-03-29 ENCOUNTER — Ambulatory Visit (INDEPENDENT_AMBULATORY_CARE_PROVIDER_SITE_OTHER): Payer: Medicare Other | Admitting: Family Medicine

## 2011-03-29 DIAGNOSIS — I10 Essential (primary) hypertension: Secondary | ICD-10-CM

## 2011-03-29 DIAGNOSIS — R7309 Other abnormal glucose: Secondary | ICD-10-CM

## 2011-03-29 DIAGNOSIS — C50911 Malignant neoplasm of unspecified site of right female breast: Secondary | ICD-10-CM

## 2011-03-29 DIAGNOSIS — Z0000 Encounter for general adult medical examination without abnormal findings: Secondary | ICD-10-CM

## 2011-03-29 DIAGNOSIS — Z853 Personal history of malignant neoplasm of breast: Secondary | ICD-10-CM | POA: Insufficient documentation

## 2011-03-29 DIAGNOSIS — E559 Vitamin D deficiency, unspecified: Secondary | ICD-10-CM

## 2011-03-29 DIAGNOSIS — E871 Hypo-osmolality and hyponatremia: Secondary | ICD-10-CM

## 2011-03-29 DIAGNOSIS — E039 Hypothyroidism, unspecified: Secondary | ICD-10-CM

## 2011-03-29 MED ORDER — VITAMIN D3 1.25 MG (50000 UT) PO CAPS: 1.0000 | ORAL_CAPSULE | ORAL | Status: DC

## 2011-03-29 NOTE — Assessment & Plan Note (Signed)
Replace with prescription then follow by OTC supplement.

## 2011-03-29 NOTE — Assessment & Plan Note (Signed)
Well controlled. Continue current medication.  

## 2011-03-29 NOTE — Assessment & Plan Note (Addendum)
Borderline control, 140/80 on recheck today.  Follow at home.

## 2011-03-29 NOTE — Assessment & Plan Note (Signed)
Resolved

## 2011-03-29 NOTE — Assessment & Plan Note (Signed)
Encouraged exercise, weight loss, healthy eating habits. ? ?

## 2011-03-29 NOTE — Patient Instructions (Addendum)
Follow BP at pharmacy.. Call if greater than 140/90 consistently. Decreasing sweets, juice, soda. Change to wheat bread. For vit D low: Start vit D prescription once weekly for 12 weeks. Then follow this with daily vit D supplement 800  UNITs. When setting up mammogram next year.. schedule bone density.

## 2011-03-29 NOTE — Progress Notes (Signed)
Subjective:    Patient ID: Diana [name], female    DOB: 12/20/1923, 77 y.o.   MRN: [id number]  HPI I have personally reviewed the Medicare Annual Wellness questionnaire and have noted 1. The patient's medical and social history 2. Their use of alcohol, tobacco or illicit drugs 3. Their current medications and supplements 4. The patient's functional ability including ADL's, fall risks, home safety risks and hearing or visual             impairment. 5. Diet and physical activities 6. Evidence for depression or mood disorders  The patients weight, height, BMI and visual acuity have been recorded in the chart I have made referrals, counseling and provided education to the patient based review of the above and I have provided the pt with a written personalized care plan for preventive services.  Hypertension:  Elevated today in office on amlodipine, diovan, torpol. Lasix prn Using medication without problems or lightheadedness:  none Chest pain with exertion:None Edema:None Short of breath:stable Average home BPs: not checking regularly. Other issues:   Elevated Cholesterol:LDL at goal <100 on no medicine Diet compliance: moderate, same as usual. Exercise:Limited, not doing any walking due to leg issues Other complaints:   Hypothyroid: Stable on current dose levo. Lab Results  Component Value Date   TSH 2.89 03/23/2011   Prediabetes: continued.  COPD, stable:on advair daily.  Vit D, deficiency  Review of Systems  Constitutional: Negative for fever and fatigue.  HENT: Negative for ear pain.   Eyes: Negative for pain.  Respiratory: Negative for chest tightness and shortness of breath.   Cardiovascular: Negative for chest pain, palpitations and leg swelling.  Gastrointestinal: Negative for abdominal pain.  Genitourinary: Negative for dysuria.       Objective:   Physical Exam  Constitutional: Vital signs are normal. She appears well-developed and well-nourished. She  is cooperative.  Non-toxic appearance. She does not appear ill. No distress.  HENT:  Head: Normocephalic.  Right Ear: Hearing, tympanic membrane, external ear and ear canal normal.  Left Ear: Hearing, tympanic membrane, external ear and ear canal normal.  Nose: Nose normal.  Eyes: Conjunctivae, EOM and lids are normal. Pupils are equal, round, and reactive to light. No foreign bodies found.  Neck: Trachea normal and normal range of motion. Neck supple. Carotid bruit is not present. No mass and no thyromegaly present.  Cardiovascular: Normal rate, regular rhythm, S1 normal, S2 normal, normal heart sounds and intact distal pulses.  Exam reveals no gallop.   No murmur heard. Pulmonary/Chest: Effort normal and breath sounds normal. No respiratory distress. She has no wheezes. She has no rhonchi. She has no rales.  Abdominal: Soft. Normal appearance and bowel sounds are normal. She exhibits no distension, no fluid wave, no abdominal bruit and no mass. There is no hepatosplenomegaly. There is no tenderness. There is no rebound, no guarding and no CVA tenderness. No hernia.  Genitourinary:       Breast exam per breast surgeon  Lymphadenopathy:    She has no cervical adenopathy.    She has no axillary adenopathy.  Neurological: She is alert. She has normal strength. No cranial nerve deficit or sensory deficit.  Skin: Skin is warm, dry and intact. No rash noted.  Psychiatric: Her speech is normal and behavior is normal. Judgment normal. Her mood appears not anxious. Cognition and memory are normal. She does not exhibit a depressed mood.          Assessment & Plan:  Annual Medicare  Wellness: The patient's preventative maintenance and recommended screening tests for an annual wellness exam were reviewed in full today. Brought up to date unless services declined.  Counselled on the importance of diet, exercise, and its role in overall health and mortality. The patient's FH and SH was reviewed,  including their home life, tobacco status, and drug and alcohol status.   Vaccines: Uptodate with flu, shingles and pnz. Refuses Td  Mammo: nml 01/2011.[facility] Hx of breast cancer 1986.. Followed by Dr. Ezzard Standing. Breast exam there. Colon: last 2003, no further indicated, no family history DVE/pap: not indicated DXA: sister nml, no family history...  Has never had.She will do DXA next year.

## 2011-04-06 ENCOUNTER — Other Ambulatory Visit: Payer: Self-pay | Admitting: *Deleted

## 2011-04-06 MED ORDER — AMLODIPINE BESYLATE 10 MG PO TABS: 10.0000 mg | ORAL_TABLET | Freq: Every day | ORAL | Status: DC

## 2011-04-27 ENCOUNTER — Telehealth: Payer: Self-pay | Admitting: Internal Medicine

## 2011-04-27 NOTE — Telephone Encounter (Signed)
Patient called and stated she is congested and coughing and having chest pains every now and then.  No SOB, sweating or numbness.  Informed her to go to either urgent care or ER because no appointments available today.  She refused and scheduled an appt. For tomorrow.  She did state if she started feeling worse she would go to ER.

## 2011-04-28 ENCOUNTER — Encounter: Payer: Self-pay | Admitting: Family Medicine

## 2011-04-28 ENCOUNTER — Ambulatory Visit (INDEPENDENT_AMBULATORY_CARE_PROVIDER_SITE_OTHER): Payer: Medicare Other | Admitting: Family Medicine

## 2011-04-28 VITALS — BP 136/74 | HR 70 | Temp 98.2°F | Wt 188.2 lb

## 2011-04-28 DIAGNOSIS — R9431 Abnormal electrocardiogram [ECG] [EKG]: Secondary | ICD-10-CM

## 2011-04-28 DIAGNOSIS — R059 Cough, unspecified: Secondary | ICD-10-CM

## 2011-04-28 DIAGNOSIS — R002 Palpitations: Secondary | ICD-10-CM

## 2011-04-28 DIAGNOSIS — R05 Cough: Secondary | ICD-10-CM

## 2011-04-28 NOTE — Assessment & Plan Note (Signed)
Viral URTI . As seems to be resolving, will monitor for now.   Update [place] if sxs not improving.

## 2011-04-28 NOTE — Patient Instructions (Signed)
EKG today - slight pause along with slow rhythm. As cough getting better, will watch for now.  If cough worsening, please call me. Given pause on EKG, I would like you to follow up with Dr. Allyson Sabal for evaluation of rapid heart rates. Pass by Marion's office for referral.

## 2011-04-28 NOTE — Assessment & Plan Note (Addendum)
2.2 sec pause on EKG today, o/w WNL.  Sinus brady at 60, normal axis, intervals (PR), no ST/T changes. In setting of endorsed palpitations, will refer to cards for eval, possible holter. States has seen Dr. Gery Pray in past. TSH normal last month. Lab Results  Component Value Date   WBC 8.2 03/23/2011   HGB 13.8 03/23/2011   HCT 40.7 03/23/2011   MCV 95.0 03/23/2011   PLT 201.0 03/23/2011

## 2011-04-28 NOTE — Progress Notes (Signed)
  Subjective:    Patient ID: Diana [name], female    DOB: April 29, 1923, 78 y.o.   MRN: [id number]  HPI CC: heart racing  4 nights ago woke up and felt heart racing. Later that morning woke up and felt heart racing again, shakey.  Episodes lasted a few minutes.  Since then has felt fatigued but no more heart racing episodes.  Denies irregular or skipped beats.  No h/o anxiety attacks.  Has been having some deep chest congestion and cough going on for 4-5 days.  Coughing dry, not productive.  No fevers/chills, abd pain, n/v.  Actually improving last 2 days  No chest pain, tightness, SOB, leg swelling, HA, dizziness.  Reviewed recent blood work from [facility] last month.  Lab Results  Component Value Date   TSH 2.89 03/23/2011   Has tried cough drops.  No sick contacts.  No smokers at home.  H/o COPD in chart but pt denies.  Wt Readings from Last 3 Encounters:  04/28/11 188 lb 4 oz (85.39 kg)  03/29/11 187 lb 6.4 oz (85.004 kg)  02/05/11 188 lb (85.276 kg)    Review of Systems Per HPI    Objective:   Physical Exam  Nursing note and vitals reviewed. Constitutional: She appears well-developed and well-nourished. No distress.  HENT:  Head: Normocephalic and atraumatic.  Mouth/Throat: Oropharynx is clear and moist. No oropharyngeal exudate.  Eyes: Conjunctivae and EOM are normal. Pupils are equal, round, and reactive to light.  Neck: Normal range of motion. Neck supple. No JVD present. Carotid bruit is not present.  Cardiovascular: Normal rate, regular rhythm, normal heart sounds and intact distal pulses.   No murmur heard. Pulmonary/Chest: Effort normal and breath sounds normal. No respiratory distress. She has no wheezes. She has no rales.  Musculoskeletal: She exhibits no edema.  Lymphadenopathy:    She has no cervical adenopathy.  Skin: Skin is warm and dry. No rash noted.  Psychiatric: She has a normal mood and affect.      Assessment & Plan:

## 2011-06-01 ENCOUNTER — Other Ambulatory Visit: Payer: Self-pay | Admitting: *Deleted

## 2011-06-01 NOTE — Telephone Encounter (Signed)
Ok, #30, 0 refills 

## 2011-06-02 ENCOUNTER — Telehealth: Payer: Self-pay | Admitting: Family Medicine

## 2011-06-02 MED ORDER — CLORAZEPATE DIPOTASSIUM 3.75 MG PO TABS: 3.7500 mg | ORAL_TABLET | Freq: Every evening | ORAL | Status: DC | PRN

## 2011-06-02 NOTE — Telephone Encounter (Signed)
Patient advised.

## 2011-06-02 NOTE — Telephone Encounter (Signed)
Patient said her MED REFILLS were sent to [facility] by mistake and that she always pick up her MEDS at [facility].  She would like to have that corrected so she can pick up her MEDS at [facility].  DIOVAN and CLORAZEPATE DIPOTASS

## 2011-06-08 ENCOUNTER — Other Ambulatory Visit: Payer: Self-pay | Admitting: *Deleted

## 2011-06-08 MED ORDER — VALSARTAN 160 MG PO TABS: 160.0000 mg | ORAL_TABLET | Freq: Every day | ORAL | Status: DC

## 2011-07-27 ENCOUNTER — Other Ambulatory Visit: Payer: Self-pay | Admitting: *Deleted

## 2011-07-27 MED ORDER — FUROSEMIDE 40 MG PO TABS: 40.0000 mg | ORAL_TABLET | Freq: Every day | ORAL | Status: DC

## 2011-10-07 ENCOUNTER — Ambulatory Visit (INDEPENDENT_AMBULATORY_CARE_PROVIDER_SITE_OTHER): Payer: Medicare Other | Admitting: Family Medicine

## 2011-10-07 ENCOUNTER — Encounter: Payer: Self-pay | Admitting: Family Medicine

## 2011-10-07 VITALS — BP 120/60 | HR 66 | Temp 98.8°F | Ht 64.0 in | Wt 185.0 lb

## 2011-10-07 DIAGNOSIS — I10 Essential (primary) hypertension: Secondary | ICD-10-CM

## 2011-10-07 DIAGNOSIS — R05 Cough: Secondary | ICD-10-CM

## 2011-10-07 DIAGNOSIS — R2689 Other abnormalities of gait and mobility: Secondary | ICD-10-CM

## 2011-10-07 DIAGNOSIS — R002 Palpitations: Secondary | ICD-10-CM

## 2011-10-07 DIAGNOSIS — R29818 Other symptoms and signs involving the nervous system: Secondary | ICD-10-CM

## 2011-10-07 NOTE — Assessment & Plan Note (Signed)
Likley viral issue. Improving now. No clear COPD exacerbation. Follow and call if symptoms worsening.

## 2011-10-07 NOTE — Assessment & Plan Note (Signed)
Resolved

## 2011-10-07 NOTE — Assessment & Plan Note (Signed)
Well controlled. Continue current medication.  

## 2011-10-07 NOTE — Progress Notes (Signed)
  Subjective:    Patient ID: Diana [name], female    DOB: November 04, 1923, 78 y.o.   MRN: [id number]  HPI  Hypertension: Well controlled in office on amlodipine, diovan, torpol. Lasix prn  Using medication without problems or lightheadedness: none  Chest pain with exertion:None  Edema:None  Short of breath:stable  Average home BPs: not checking regularly.  Other issues:   Diet compliance: moderate, same as usual.  Exercise:Limited, not doing any walking due to leg issues  Other complaints:   Palpitations: seen in 04/2011 by Dr. Reece Agar.  EKG stable. Referred to cards saw Dr. Alanda Amass. Neg eval. Has 6 mth follow up coming up.  She feels it was an anxiety attack.  no problems since.  COPD, stable:on advair daily.  She has had some congestion.  SHe reports better today.  Cough improved.  She feels it may be due to heat recently.     Review of Systems  Constitutional: Negative for fever and fatigue.  HENT: Negative for ear pain.   Eyes: Negative for pain.  Respiratory: Positive for chest tightness and shortness of breath.   Cardiovascular: Negative for chest pain, palpitations and leg swelling.  Gastrointestinal: Negative for abdominal pain.  Genitourinary: Negative for dysuria.  Neurological: Positive for weakness. Negative for tremors.       Balance issues chronically, worsening in last few years. Scared she will fall. Uses four pronged cane but does have  Walker.        Objective:   Physical Exam  Constitutional: She is oriented to person, place, and time. Vital signs are normal. She appears well-developed and well-nourished. She is cooperative.  Non-toxic appearance. She does not appear ill. No distress.  HENT:  Head: Normocephalic.  Right Ear: Hearing, tympanic membrane, external ear and ear canal normal. Tympanic membrane is not erythematous, not retracted and not bulging.  Left Ear: Hearing, tympanic membrane, external ear and ear canal normal. Tympanic membrane is not  erythematous, not retracted and not bulging.  Nose: No mucosal edema or rhinorrhea. Right sinus exhibits no maxillary sinus tenderness and no frontal sinus tenderness. Left sinus exhibits no maxillary sinus tenderness and no frontal sinus tenderness.  Mouth/Throat: Uvula is midline, oropharynx is clear and moist and mucous membranes are normal.  Eyes: Conjunctivae, EOM and lids are normal. Pupils are equal, round, and reactive to light. No foreign bodies found.  Neck: Trachea normal and normal range of motion. Neck supple. Carotid bruit is not present. No mass and no thyromegaly present.  Cardiovascular: Normal rate, regular rhythm, S1 normal, S2 normal, normal heart sounds, intact distal pulses and normal pulses.  Exam reveals no gallop and no friction rub.   No murmur heard. Pulmonary/Chest: Effort normal and breath sounds normal. Not tachypneic. No respiratory distress. She has no decreased breath sounds. She has no wheezes. She has no rhonchi. She has no rales. She exhibits no tenderness.  Abdominal: Soft. Normal appearance and bowel sounds are normal. There is no tenderness.  Neurological: She is alert and oriented to person, place, and time. Coordination normal.       nml cerebellar exam, slowed gait   Skin: Skin is warm, dry and intact. No rash noted.  Psychiatric: Her speech is normal and behavior is normal. Judgment and thought content normal. Her mood appears not anxious. Cognition and memory are normal. She does not exhibit a depressed mood.          Assessment & Plan:

## 2011-10-07 NOTE — Assessment & Plan Note (Signed)
Ongoing for many years, but may be gradaully worsening in last few years.  Hx of encephalitits in 1961.  Nml B12, vit D treated in past, thyroid stable.  Offered referral to neuro. Pt refused for now.

## 2011-10-07 NOTE — Patient Instructions (Addendum)
Call if cough continues and especially if productive cough starts with a change in mucus, or if wheezing starts. Call if interested in referral to neurologist for evaluation of balance. Use walker for stability.

## 2011-11-01 ENCOUNTER — Other Ambulatory Visit: Payer: Self-pay | Admitting: *Deleted

## 2011-11-01 MED ORDER — LEVOTHYROXINE SODIUM 88 MCG PO TABS: 88.0000 ug | ORAL_TABLET | Freq: Every day | ORAL | Status: DC

## 2011-11-01 MED ORDER — METOPROLOL SUCCINATE ER 50 MG PO TB24: 50.0000 mg | ORAL_TABLET | Freq: Every day | ORAL | Status: DC

## 2011-11-01 NOTE — Telephone Encounter (Signed)
Received faxed refill request from pharmacy. Refills sent to pharmacy electronically. 

## 2011-12-27 ENCOUNTER — Other Ambulatory Visit: Payer: Self-pay | Admitting: *Deleted

## 2011-12-27 MED ORDER — VALSARTAN 160 MG PO TABS: 160.0000 mg | ORAL_TABLET | Freq: Every day | ORAL | Status: DC

## 2011-12-27 NOTE — Telephone Encounter (Signed)
Received faxed refill request from pharmacy. Refill sent to pharmacy electronically. 

## 2012-02-04 ENCOUNTER — Encounter: Payer: Self-pay | Admitting: Family Medicine

## 2012-02-04 ENCOUNTER — Ambulatory Visit (INDEPENDENT_AMBULATORY_CARE_PROVIDER_SITE_OTHER): Payer: Medicare Other | Admitting: Family Medicine

## 2012-02-04 VITALS — BP 150/70 | HR 68 | Temp 98.4°F | Wt 171.0 lb

## 2012-02-04 DIAGNOSIS — J449 Chronic obstructive pulmonary disease, unspecified: Secondary | ICD-10-CM

## 2012-02-04 DIAGNOSIS — J069 Acute upper respiratory infection, unspecified: Secondary | ICD-10-CM

## 2012-02-04 NOTE — Patient Instructions (Addendum)
Try Mucinex DM not D twice daily. No decongestant.  Call if new fever measured or if not improving some in the next 4-5 days. Call if change in sputum to yellow green etc, SOB.

## 2012-02-04 NOTE — Assessment & Plan Note (Signed)
No sign of bacterial infection or COPD exacerbation. Symptomatic care.

## 2012-02-04 NOTE — Progress Notes (Signed)
  Subjective:    Patient ID: Diana [name], female    DOB: 09/20/1923, 78 y.o.   MRN: [id number]  Cough This is a new problem. The current episode started in the past 7 days (4 days). The problem has been gradually worsening. The problem occurs constantly. The cough is non-productive (most at night, but staying asleep). Associated symptoms include nasal congestion and sweats. Pertinent negatives include no ear congestion, ear pain, fever, headaches, rash, rhinorrhea, sore throat, shortness of breath or wheezing. Associated symptoms comments: No sinus pressure. The symptoms are aggravated by lying down. Treatments tried: coricidin. The treatment provided no relief. Her past medical history is significant for COPD.   HTN: BP up today.. BP had been well controlled lately. BP Readings from Last 3 Encounters:  02/04/12 150/70  10/07/11 120/60  04/28/11 136/74      Review of Systems  Constitutional: Negative for fever.  HENT: Negative for ear pain, sore throat and rhinorrhea.   Respiratory: Positive for cough. Negative for shortness of breath and wheezing.   Skin: Negative for rash.  Neurological: Negative for headaches.       Objective:   Physical Exam  Constitutional: Vital signs are normal. She appears well-developed and well-nourished. She is cooperative.  Non-toxic appearance. She does not appear ill. No distress.  HENT:  Head: Normocephalic.  Right Ear: Hearing, tympanic membrane, external ear and ear canal normal. Tympanic membrane is not erythematous, not retracted and not bulging.  Left Ear: Hearing, tympanic membrane, external ear and ear canal normal. Tympanic membrane is not erythematous, not retracted and not bulging.  Nose: Mucosal edema and rhinorrhea present. Right sinus exhibits no maxillary sinus tenderness and no frontal sinus tenderness. Left sinus exhibits no maxillary sinus tenderness and no frontal sinus tenderness.  Mouth/Throat: Uvula is midline, oropharynx is  clear and moist and mucous membranes are normal.  Eyes: Conjunctivae normal, EOM and lids are normal. Pupils are equal, round, and reactive to light. No foreign bodies found.  Neck: Trachea normal and normal range of motion. Neck supple. Carotid bruit is not present. No mass and no thyromegaly present.  Cardiovascular: Normal rate, regular rhythm, S1 normal, S2 normal, normal heart sounds, intact distal pulses and normal pulses.  Exam reveals no gallop and no friction rub.   No murmur heard. Pulmonary/Chest: Effort normal and breath sounds normal. Not tachypneic. No respiratory distress. She has no decreased breath sounds. She has no wheezes. She has no rhonchi. She has no rales.  Neurological: She is alert.  Skin: Skin is warm, dry and intact. No rash noted.  Psychiatric: Her speech is normal and behavior is normal. Judgment normal. Her mood appears not anxious. Cognition and memory are normal. She does not exhibit a depressed mood.          Assessment & Plan:

## 2012-02-28 ENCOUNTER — Other Ambulatory Visit: Payer: Self-pay | Admitting: *Deleted

## 2012-02-28 MED ORDER — FUROSEMIDE 40 MG PO TABS: 40.0000 mg | ORAL_TABLET | Freq: Every day | ORAL | Status: DC

## 2012-03-27 ENCOUNTER — Other Ambulatory Visit: Payer: Self-pay | Admitting: *Deleted

## 2012-03-27 MED ORDER — AMLODIPINE BESYLATE 10 MG PO TABS: 10.0000 mg | ORAL_TABLET | Freq: Every day | ORAL | Status: DC

## 2012-04-04 ENCOUNTER — Other Ambulatory Visit: Payer: Self-pay | Admitting: *Deleted

## 2012-04-04 MED ORDER — METOPROLOL SUCCINATE ER 50 MG PO TB24: 50.0000 mg | ORAL_TABLET | Freq: Every day | ORAL | Status: DC

## 2012-04-04 MED ORDER — LEVOTHYROXINE SODIUM 88 MCG PO TABS: 88.0000 ug | ORAL_TABLET | Freq: Every day | ORAL | Status: DC

## 2012-04-04 NOTE — Addendum Note (Signed)
Addended by: Baldomero Lamy on: 04/04/2012 07:31 AM   Modules accepted: Orders

## 2012-04-05 ENCOUNTER — Encounter: Payer: Self-pay | Admitting: Family Medicine

## 2012-04-05 ENCOUNTER — Ambulatory Visit (INDEPENDENT_AMBULATORY_CARE_PROVIDER_SITE_OTHER): Payer: Medicare Other | Admitting: Family Medicine

## 2012-04-05 VITALS — BP 130/68 | HR 68 | Temp 98.6°F | Wt 189.8 lb

## 2012-04-05 DIAGNOSIS — R002 Palpitations: Secondary | ICD-10-CM

## 2012-04-05 NOTE — Patient Instructions (Addendum)
Your EKG looks fine.  I would keep the appointment with cardiology in 1/14.  If you have another episode that doesn't quickly resolve, then I would take an extra metoprolol once.   Notify cardiology and/or Dr. Ermalene Searing if you have another episode.   Take care.

## 2012-04-05 NOTE — Assessment & Plan Note (Signed)
Possible. Resolved now.  EKG reassuring.  Would keep routine cards appointment.  Advised to check pulse if another episode and if elevated and persisting then take an extra dose of metoprolol.  She agrees.  Advised to cut back on caffeine in meantime.

## 2012-04-05 NOTE — Progress Notes (Signed)
Last night she went to bed as usual.  She woke up with heart racing (but she didn't check her pulse) in the middle of the night.  She sat on the side of the bed and the symptoms continued.  It continued intermittently for about 2 hours, maybe longer.  She is tired today as she didn't rest well last night.  She doesn't recall being SOB or having CP last night.  No other episodes today like last night.  She isn't typically worried and "I was happy as a June bug last night because today is our 65th anniversary."    She had seen cards in 7/13 and has routine f/u schedule for next month with them. No CP, SOB, heart racing at time of exam.  She does use caffeine daily. No recent med changes or omissions. On BB at baseline.   Meds, vitals, and allergies reviewed.   ROS: See HPI.  Otherwise, noncontributory.  nad ncat Rrr, no ectopy noted ctab abd soft Ext w/o edema.   EKG reviewed, no sig change from prev.

## 2012-04-25 ENCOUNTER — Other Ambulatory Visit: Payer: Self-pay | Admitting: *Deleted

## 2012-04-25 MED ORDER — VALSARTAN 160 MG PO TABS: 160.0000 mg | ORAL_TABLET | Freq: Every day | ORAL | Status: DC

## 2012-05-26 ENCOUNTER — Telehealth: Payer: Self-pay | Admitting: Family Medicine

## 2012-05-26 NOTE — Telephone Encounter (Signed)
Completed in outbox.

## 2012-05-26 NOTE — Telephone Encounter (Signed)
Mrs. Burklow dropped off a placard form to be filled out by Dr. Ermalene Searing

## 2012-05-30 ENCOUNTER — Other Ambulatory Visit: Payer: Self-pay | Admitting: *Deleted

## 2012-05-30 MED ORDER — METOPROLOL SUCCINATE ER 50 MG PO TB24: 50.0000 mg | ORAL_TABLET | Freq: Every day | ORAL | Status: DC

## 2012-05-30 MED ORDER — LEVOTHYROXINE SODIUM 88 MCG PO TABS: 88.0000 ug | ORAL_TABLET | Freq: Every day | ORAL | Status: DC

## 2012-07-18 ENCOUNTER — Other Ambulatory Visit: Payer: Self-pay | Admitting: *Deleted

## 2012-07-18 MED ORDER — CLORAZEPATE DIPOTASSIUM 3.75 MG PO TABS: 3.7500 mg | ORAL_TABLET | Freq: Every evening | ORAL | Status: DC | PRN

## 2012-07-18 NOTE — Telephone Encounter (Signed)
Last filled 06/01/12

## 2012-07-31 ENCOUNTER — Other Ambulatory Visit: Payer: Self-pay | Admitting: *Deleted

## 2012-07-31 MED ORDER — LEVOTHYROXINE SODIUM 88 MCG PO TABS: 88.0000 ug | ORAL_TABLET | Freq: Every day | ORAL | Status: DC

## 2012-07-31 MED ORDER — METOPROLOL SUCCINATE ER 50 MG PO TB24: 50.0000 mg | ORAL_TABLET | Freq: Every day | ORAL | Status: DC

## 2012-07-31 NOTE — Addendum Note (Signed)
Addended by: Liane Comber C on: 07/31/2012 01:05 PM   Modules accepted: Orders

## 2012-08-08 ENCOUNTER — Ambulatory Visit (INDEPENDENT_AMBULATORY_CARE_PROVIDER_SITE_OTHER): Payer: Medicare Other | Admitting: Family Medicine

## 2012-08-08 ENCOUNTER — Encounter: Payer: Self-pay | Admitting: Family Medicine

## 2012-08-08 VITALS — BP 130/70 | HR 69 | Temp 98.5°F | Ht 64.0 in | Wt 184.8 lb

## 2012-08-08 DIAGNOSIS — E559 Vitamin D deficiency, unspecified: Secondary | ICD-10-CM

## 2012-08-08 DIAGNOSIS — E871 Hypo-osmolality and hyponatremia: Secondary | ICD-10-CM

## 2012-08-08 DIAGNOSIS — R7309 Other abnormal glucose: Secondary | ICD-10-CM

## 2012-08-08 DIAGNOSIS — E039 Hypothyroidism, unspecified: Secondary | ICD-10-CM

## 2012-08-08 DIAGNOSIS — J4489 Other specified chronic obstructive pulmonary disease: Secondary | ICD-10-CM

## 2012-08-08 DIAGNOSIS — I10 Essential (primary) hypertension: Secondary | ICD-10-CM

## 2012-08-08 DIAGNOSIS — J449 Chronic obstructive pulmonary disease, unspecified: Secondary | ICD-10-CM

## 2012-08-08 DIAGNOSIS — G4700 Insomnia, unspecified: Secondary | ICD-10-CM

## 2012-08-08 MED ORDER — CLORAZEPATE DIPOTASSIUM 3.75 MG PO TABS: 3.7500 mg | ORAL_TABLET | Freq: Every evening | ORAL | Status: DC | PRN

## 2012-08-08 NOTE — Progress Notes (Signed)
Hypertension: Well controlled in office on amlodipine, diovan, torpol. Lasix prn  Using medication without problems or lightheadedness: none  Chest pain with exertion:None  Edema:None  Short of breath:stable  Average home BPs: not checking regularly.  Other issues:  Diet compliance: moderate, same as usual.  Exercise:Limited, not doing any walking due to leg issues  Other complaints:   Palpitations: seen in 04/2011 by Dr. Reece Agar. EKG stable. Referred to cards saw Dr. Alanda Amass. Neg eval.  She feels it was an anxiety attack.  Had one episode of palpitations.. Saw Dr. Para March. She feels this was an other panic attack.  Has appt with cards  coming up in next few months.  Difficulty sleeping at night.. worring about things. No anxiety during the day. In past clorazepate has helped significantly.. Uses rarely.  Helps also with leg figiting.  COPD, stable on advair daily.  Regular cough.. nonproductive  She does have pain in low back and left leg. Ibuprofen helps with pain. She states it is not a big issue.. She refuses further eval. Balance issues remain.. Refuses neuro referral.     Hypothyroid:  Due for re-eval. Lab Results  Component Value Date   TSH 2.89 03/23/2011    .  Review of Systems  Constitutional: Negative for fever and fatigue.  HENT: Negative for ear pain.  Eyes: Negative for pain.  Respiratory: Positive for chest tightness and shortness of breath.  Cardiovascular: Negative for chest pain, palpitations and leg swelling.  Gastrointestinal: Negative for abdominal pain.  Genitourinary: Negative for dysuria.  Neurological: Positive for weakness. Negative for tremors.  Balance issues chronically, worsening in last few years. Scared she will fall.  Uses four pronged cane but does have Walker.  Objective:   Physical Exam  Constitutional: She is oriented to person, place, and time. Vital signs are normal. She appears well-developed and well-nourished. She is cooperative.  Non-toxic appearance. She does not appear ill. No distress.  HENT:  Head: Normocephalic.  Right Ear: Hearing, tympanic membrane, external ear and ear canal normal. Tympanic membrane is not erythematous, not retracted and not bulging.  Left Ear: Hearing, tympanic membrane, external ear and ear canal normal. Tympanic membrane is not erythematous, not retracted and not bulging.  Nose: No mucosal edema or rhinorrhea. Right sinus exhibits no maxillary sinus tenderness and no frontal sinus tenderness. Left sinus exhibits no maxillary sinus tenderness and no frontal sinus tenderness.  Mouth/Throat: Uvula is midline, oropharynx is clear and moist and mucous membranes are normal.  Eyes: Conjunctivae, EOM and lids are normal. Pupils are equal, round, and reactive to light. No foreign bodies found.  Neck: Trachea normal and normal range of motion. Neck supple. Carotid bruit is not present. No mass and no thyromegaly present.  Cardiovascular: Normal rate, regular rhythm, S1 normal, S2 normal, normal heart sounds, intact distal pulses and normal pulses. Exam reveals no gallop and no friction rub.  No murmur heard.  Pulmonary/Chest: Effort normal and breath sounds normal. Not tachypneic. No respiratory distress. She has no decreased breath sounds. She has no wheezes. She has no rhonchi. She has no rales. She exhibits no tenderness.  Abdominal: Soft. Normal appearance and bowel sounds are normal. There is no tenderness.  Neurological: She is alert and oriented to person, place, and time. Coordination normal.  nml cerebellar exam, slowed gait Skin: Skin is warm, dry and intact. No rash noted.  Psychiatric: Her speech is normal and behavior is normal. Judgment and thought content normal. Her mood appears not anxious. Cognition and  memory are normal. She does not exhibit a depressed mood.

## 2012-08-08 NOTE — Patient Instructions (Addendum)
If you feel like balance, leg pain worsening.. Let me know. If sleep poorly controlled and needing clorazepate more frequently.. Let me know so we can change to trazodone. Return for fasting labs only in next few weeks. Schedule medicare wellness in 6 months.

## 2012-08-08 NOTE — Assessment & Plan Note (Signed)
Discussed safer options for treatment of insomnia... She understands riska nd wishes to continue clorazepate as she uses it very rarely.  If need increasing.. Consider trial of trazodone.

## 2012-08-08 NOTE — Assessment & Plan Note (Signed)
Well controlled. Continue current medication.  

## 2012-08-08 NOTE — Assessment & Plan Note (Signed)
Due for re-eval. 

## 2012-08-08 NOTE — Assessment & Plan Note (Signed)
Stable

## 2012-08-28 ENCOUNTER — Other Ambulatory Visit: Payer: Self-pay | Admitting: *Deleted

## 2012-08-28 MED ORDER — METOPROLOL SUCCINATE ER 50 MG PO TB24: 50.0000 mg | ORAL_TABLET | Freq: Every day | ORAL | Status: DC

## 2012-08-28 MED ORDER — LEVOTHYROXINE SODIUM 88 MCG PO TABS: 88.0000 ug | ORAL_TABLET | Freq: Every day | ORAL | Status: DC

## 2012-08-29 ENCOUNTER — Other Ambulatory Visit (INDEPENDENT_AMBULATORY_CARE_PROVIDER_SITE_OTHER): Payer: Medicare Other

## 2012-08-29 DIAGNOSIS — E559 Vitamin D deficiency, unspecified: Secondary | ICD-10-CM

## 2012-08-29 DIAGNOSIS — E871 Hypo-osmolality and hyponatremia: Secondary | ICD-10-CM

## 2012-08-29 DIAGNOSIS — E039 Hypothyroidism, unspecified: Secondary | ICD-10-CM

## 2012-08-29 DIAGNOSIS — R7309 Other abnormal glucose: Secondary | ICD-10-CM

## 2012-08-29 DIAGNOSIS — I10 Essential (primary) hypertension: Secondary | ICD-10-CM

## 2012-08-29 LAB — COMPREHENSIVE METABOLIC PANEL
BUN: 10 mg/dL (ref 6–23)
CO2: 28 mEq/L (ref 19–32)
Calcium: 8.8 mg/dL (ref 8.4–10.5)
Chloride: 100 mEq/L (ref 96–112)
Creatinine, Ser: 0.8 mg/dL (ref 0.4–1.2)
GFR: 70.13 mL/min (ref >60.00)

## 2012-08-29 LAB — LIPID PANEL
HDL: 37.5 mg/dL — ABNORMAL LOW (ref >39.00)
Triglycerides: 154 mg/dL — ABNORMAL HIGH (ref 0.0–149.0)

## 2012-08-31 ENCOUNTER — Telehealth: Payer: Self-pay | Admitting: Family Medicine

## 2012-08-31 MED ORDER — ERGOCALCIFEROL 1.25 MG (50000 UT) PO CAPS: 50000.0000 [IU] | ORAL_CAPSULE | ORAL | Status: DC

## 2012-08-31 NOTE — Telephone Encounter (Signed)
Message copied by Excell Seltzer on Thu Aug 31, 2012 11:22 PM ------      Message from: Criselda Peaches B      Created: Thu Aug 31, 2012  2:36 PM       Spoke with pt.  She is not currently taking Vit D supplement. ------

## 2012-08-31 NOTE — Telephone Encounter (Signed)
I will send in vit D 50000 prescription for her to take weekly x 12 weeks then when done change to OTC vit D3 400 IU twice daily

## 2012-09-25 ENCOUNTER — Other Ambulatory Visit: Payer: Self-pay | Admitting: *Deleted

## 2012-09-25 MED ORDER — FUROSEMIDE 40 MG PO TABS: 40.0000 mg | ORAL_TABLET | Freq: Every day | ORAL | Status: DC

## 2012-11-20 ENCOUNTER — Other Ambulatory Visit: Payer: Self-pay | Admitting: Family Medicine

## 2012-12-27 ENCOUNTER — Other Ambulatory Visit (INDEPENDENT_AMBULATORY_CARE_PROVIDER_SITE_OTHER): Payer: Medicare Other

## 2012-12-27 ENCOUNTER — Telehealth: Payer: Self-pay | Admitting: Family Medicine

## 2012-12-27 DIAGNOSIS — E039 Hypothyroidism, unspecified: Secondary | ICD-10-CM

## 2012-12-27 DIAGNOSIS — E559 Vitamin D deficiency, unspecified: Secondary | ICD-10-CM

## 2012-12-27 DIAGNOSIS — E871 Hypo-osmolality and hyponatremia: Secondary | ICD-10-CM

## 2012-12-27 DIAGNOSIS — I10 Essential (primary) hypertension: Secondary | ICD-10-CM

## 2012-12-27 LAB — LIPID PANEL
Cholesterol: 171 mg/dL (ref 0–200)
LDL Cholesterol: 101 mg/dL — ABNORMAL HIGH (ref 0–99)
Triglycerides: 161 mg/dL — ABNORMAL HIGH (ref 0.0–149.0)

## 2012-12-27 LAB — CBC WITH DIFFERENTIAL/PLATELET
Basophils Absolute: 0 10*3/uL (ref 0.0–0.1)
Basophils Relative: 0.4 % (ref 0.0–3.0)
Eosinophils Absolute: 0.3 10*3/uL (ref 0.0–0.7)
MCHC: 33.4 g/dL (ref 30.0–36.0)
MCV: 94.6 fl (ref 78.0–100.0)
Monocytes Absolute: 0.7 10*3/uL (ref 0.1–1.0)
Neutrophils Relative %: 54.4 % (ref 43.0–77.0)
RBC: 4.45 Mil/uL (ref 3.87–5.11)
RDW: 13.9 % (ref 11.5–14.6)

## 2012-12-27 LAB — BASIC METABOLIC PANEL
BUN: 12 mg/dL (ref 6–23)
Creatinine, Ser: 0.9 mg/dL (ref 0.4–1.2)
GFR: 67.23 mL/min (ref >60.00)
Potassium: 3.9 mEq/L (ref 3.5–5.1)

## 2012-12-27 LAB — HEPATIC FUNCTION PANEL
ALT: 22 U/L (ref 0–35)
AST: 20 U/L (ref 0–37)
Albumin: 4 g/dL (ref 3.5–5.2)
Alkaline Phosphatase: 67 U/L (ref 39–117)
Bilirubin, Direct: 0 mg/dL (ref 0.0–0.3)
Total Protein: 7.6 g/dL (ref 6.0–8.3)

## 2012-12-27 NOTE — Telephone Encounter (Signed)
Message copied by Excell Seltzer on Wed Dec 27, 2012 10:16 AM ------      Message from: Alvina Chou      Created: Tue Dec 26, 2012  5:35 PM      Regarding: Lab orders for Wednesday, 9.10.14       Patient is scheduled for CPX labs, please order future labs, Thanks , Terri       ------

## 2012-12-29 ENCOUNTER — Ambulatory Visit (INDEPENDENT_AMBULATORY_CARE_PROVIDER_SITE_OTHER): Payer: Medicare Other | Admitting: Family Medicine

## 2012-12-29 ENCOUNTER — Encounter: Payer: Self-pay | Admitting: Family Medicine

## 2012-12-29 VITALS — BP 120/70 | HR 69 | Temp 98.3°F | Ht 64.0 in | Wt 183.5 lb

## 2012-12-29 DIAGNOSIS — R7309 Other abnormal glucose: Secondary | ICD-10-CM

## 2012-12-29 DIAGNOSIS — Z0000 Encounter for general adult medical examination without abnormal findings: Secondary | ICD-10-CM

## 2012-12-29 DIAGNOSIS — I10 Essential (primary) hypertension: Secondary | ICD-10-CM

## 2012-12-29 DIAGNOSIS — M545 Low back pain: Secondary | ICD-10-CM

## 2012-12-29 DIAGNOSIS — Z23 Encounter for immunization: Secondary | ICD-10-CM

## 2012-12-29 DIAGNOSIS — E871 Hypo-osmolality and hyponatremia: Secondary | ICD-10-CM

## 2012-12-29 DIAGNOSIS — R21 Rash and other nonspecific skin eruption: Secondary | ICD-10-CM

## 2012-12-29 MED ORDER — MELOXICAM 7.5 MG PO TABS: 7.5000 mg | ORAL_TABLET | Freq: Every day | ORAL | Status: DC

## 2012-12-29 MED ORDER — MUPIROCIN 2 % EX OINT: TOPICAL_OINTMENT | Freq: Three times a day (TID) | CUTANEOUS | Status: DC

## 2012-12-29 NOTE — Patient Instructions (Addendum)
Start heat, massage, gentle stretching. Use meloxicam for pain. Cut back further on sweets, no sweeten beverage. Call to set mammogram on your own.  Apply antibiotic ointment to neck.. Call if redness spreading.

## 2012-12-29 NOTE — Progress Notes (Signed)
I have personally reviewed the Medicare Annual Wellness questionnaire and have noted  1. The patient's medical and social history  2. Their use of alcohol, tobacco or illicit drugs  3. Their current medications and supplements  4. The patient's functional ability including ADL's, fall risks, home safety risks and hearing or visual  impairment.  5. Diet and physical activities  6. Evidence for depression or mood disorders  The patients weight, height, BMI and visual acuity have been recorded in the chart  I have made referrals, counseling and provided education to the patient based review of the above and I have provided the pt with a written personalized care plan for preventive services.   She has been having some lower back pain. No falls. One time when she stepped wrong she felt pull in back. Initially some pain down right leg but better now. No numbness, no weakness, no new incontinence. No fever. She has been using ibuprofen, helps some temporarily.  She has had some off and on back issues in past. Saw Dr. Channing Mutters many years ago.  She has also had some neck pain off and on, muscle strain.  Was stiff when turns her head, occ clicking.Now completely gone.   Hair dresser cut neck at appt Tuesday... slight redness and pain at site, gradually increasing since then.   Hypertension: At goal today in office on amlodipine, diovan, torpol. Lasix prn  Using medication without problems or lightheadedness: none  Chest pain with exertion:None  Edema:None  Short of breath:stable  Average home BPs: not checking regularly.  Other issues:   Elevated Cholesterol:LDL at goal <100 on no medicine  Lab Results  Component Value Date   CHOL 171 12/27/2012   HDL 37.80* 12/27/2012   LDLCALC 101* 12/27/2012   TRIG 161.0* 12/27/2012   CHOLHDL 5 12/27/2012  Diet compliance: moderate, same as usual.  Exercise:Limited, not doing any walking due to leg issues  Other complaints:   Hypothyroid: Stable on current dose  levo.  Lab Results  Component Value Date   TSH 2.01 12/27/2012   Prediabetes: continued, worsened.  CBG 120 at this OV.  Eats lots of cake, pie.  COPD, stable:on advair daily.   Review of Systems  Constitutional: Negative for fever and fatigue.  HENT: Negative for ear pain.  Eyes: Negative for pain.  Respiratory: Negative for chest tightness and shortness of breath.  Cardiovascular: Negative for chest pain, palpitations and leg swelling.  Gastrointestinal: Negative for abdominal pain.  Genitourinary: Negative for dysuria.  Objective:   Physical Exam  Constitutional: Vital signs are normal. She appears well-developed and well-nourished. She is cooperative. Non-toxic appearance. She does not appear ill. No distress.  HENT:  Head: Normocephalic.  Right Ear: Hearing, tympanic membrane, external ear and ear canal normal.  Left Ear: Hearing, tympanic membrane, external ear and ear canal normal.  Nose: Nose normal.  Eyes: Conjunctivae, EOM and lids are normal. Pupils are equal, round, and reactive to light. No foreign bodies found.  Neck: Trachea normal and normal range of motion. Neck supple. Carotid bruit is not present. No mass and no thyromegaly present.  Cardiovascular: Normal rate, regular rhythm, S1 normal, S2 normal, normal heart sounds and intact distal pulses. Exam reveals no gallop.  No murmur heard.  Pulmonary/Chest: Effort normal and breath sounds normal. No respiratory distress. She has no wheezes. She has no rhonchi. She has no rales.  Abdominal: Soft. Normal appearance and bowel sounds are normal. She exhibits no distension, no fluid wave, no abdominal  bruit and no mass. There is no hepatosplenomegaly. There is no tenderness. There is no rebound, no guarding and no CVA tenderness. No hernia.  Genitourinary:  B breast no masses, no nipple changes. No DVE/PAP  Lymphadenopathy:  She has no cervical adenopathy.  She has no axillary adenopathy.  Neurological: She is alert. She  has decreased strength in lower extremeties. No cranial nerve deficit or sensory deficit.  She is unsteady on her feet.  She cannot get up on table. No focal ttp in low back, cannot perform SLR. Skin: Skin is warm, dry and intact. Erythema and dry plaque noted at site of scratch on left neck...3cm x 2 cm  Psychiatric: Her speech is normal and behavior is normal. Judgment normal. Her mood appears not anxious. Cognition and memory are normal. She does not exhibit a depressed mood.  Assessment & Plan:   Annual Medicare Wellness: The patient's preventative maintenance and recommended screening tests for an annual wellness exam were reviewed in full today.  Brought up to date unless services declined.  Counselled on the importance of diet, exercise, and its role in overall health and mortality.  The patient's FH and SH was reviewed, including their home life, tobacco status, and drug and alcohol status.   Vaccines: Uptodate with flu (given today), shingles and pna. Refuses Td  Mammo: nml 01/2011.Marland Kitchen Hx of breast cancer 1986.. Followed by Dr. Ezzard Standing in past. Breast exam here now. Schedule mamm on her own. Colon: last 2003, no further indicated, no family history  DVE/pap: not indicated  DXA: sister nml, no family history... Has never had. She refuses this at this time.

## 2013-01-03 DIAGNOSIS — R21 Rash and other nonspecific skin eruption: Secondary | ICD-10-CM | POA: Insufficient documentation

## 2013-01-03 NOTE — Assessment & Plan Note (Signed)
Well controlled. Continue current medication.  

## 2013-01-03 NOTE — Assessment & Plan Note (Signed)
Stable. Encouraged exercise as tolerated, weight loss, healthy eating habits.

## 2013-01-03 NOTE — Assessment & Plan Note (Signed)
Concerning for bacterial superinfection. Treat with mupirocin ointment... Pt to calll if redness spreading.

## 2013-01-03 NOTE — Assessment & Plan Note (Signed)
Resolved

## 2013-01-03 NOTE — Assessment & Plan Note (Signed)
Start heat, massage, gentle stretching. Use meloxicam for pain.  if not improving consider further eval.

## 2013-01-29 ENCOUNTER — Other Ambulatory Visit: Payer: Medicare Other

## 2013-02-02 ENCOUNTER — Encounter: Payer: Medicare Other | Admitting: Family Medicine

## 2013-03-26 ENCOUNTER — Other Ambulatory Visit: Payer: Self-pay | Admitting: Family Medicine

## 2013-04-02 ENCOUNTER — Other Ambulatory Visit: Payer: Self-pay | Admitting: Family Medicine

## 2013-05-01 ENCOUNTER — Other Ambulatory Visit: Payer: Self-pay | Admitting: Family Medicine

## 2013-05-21 ENCOUNTER — Other Ambulatory Visit: Payer: Self-pay | Admitting: Family Medicine

## 2013-08-13 ENCOUNTER — Other Ambulatory Visit: Payer: Self-pay | Admitting: Family Medicine

## 2013-08-13 NOTE — Telephone Encounter (Signed)
Last office visit 12/29/12. Ok to refill?

## 2013-08-14 NOTE — Telephone Encounter (Signed)
Prescription faxed to [facility] at [phone].

## 2013-09-17 ENCOUNTER — Other Ambulatory Visit: Payer: Self-pay | Admitting: Family Medicine

## 2013-09-17 NOTE — Telephone Encounter (Signed)
Last office visit 12/29/2012.  Ok to refill?

## 2013-09-25 ENCOUNTER — Other Ambulatory Visit: Payer: Self-pay | Admitting: Family Medicine

## 2013-10-17 ENCOUNTER — Emergency Department: Payer: Self-pay | Admitting: Emergency Medicine

## 2013-10-31 ENCOUNTER — Other Ambulatory Visit: Payer: Self-pay | Admitting: Family Medicine

## 2013-11-19 ENCOUNTER — Other Ambulatory Visit: Payer: Self-pay | Admitting: Family Medicine

## 2013-12-17 ENCOUNTER — Other Ambulatory Visit: Payer: Self-pay | Admitting: Family Medicine

## 2013-12-26 ENCOUNTER — Other Ambulatory Visit: Payer: Self-pay | Admitting: Family Medicine

## 2014-01-10 ENCOUNTER — Encounter: Payer: Self-pay | Admitting: Family Medicine

## 2014-01-10 ENCOUNTER — Ambulatory Visit (INDEPENDENT_AMBULATORY_CARE_PROVIDER_SITE_OTHER): Payer: Medicare Other | Admitting: Family Medicine

## 2014-01-10 VITALS — BP 120/68 | HR 70 | Temp 98.5°F | Ht 62.0 in | Wt 175.5 lb

## 2014-01-10 DIAGNOSIS — Z23 Encounter for immunization: Secondary | ICD-10-CM

## 2014-01-10 DIAGNOSIS — Z0000 Encounter for general adult medical examination without abnormal findings: Secondary | ICD-10-CM

## 2014-01-10 NOTE — Progress Notes (Signed)
I have personally reviewed the Medicare Annual Wellness questionnaire and have noted  1. The patient's medical and social history  2. Their use of alcohol, tobacco or illicit drugs  3. Their current medications and supplements  4. The patient's functional ability including ADL's, fall risks, home safety risks and hearing or visual  impairment.  5. Diet and physical activities  6. Evidence for depression or mood disorders  The patients weight, height, BMI and visual acuity have been recorded in the chart  I have made referrals, counseling and provided education to the patient based review of the above and I have provided the pt with a written personalized care plan for preventive services.   Husband of 42 years passed away last week.  she is grieving appropriately.  Hypertension: At goal today in office on amlodipine, diovan, torpol. Lasix prn  BP Readings from Last 3 Encounters:  01/10/14 120/68  12/29/12 120/70  08/08/12 130/70  Using medication without problems or lightheadedness: none  Chest pain with exertion:None  Edema:None  Short of breath:stable  Average home BPs: not checking regularly.  Other issues:   Elevated Cholesterol:LDL previously at goal <100 on no medicine  Due for re-eval. Lab Results  Component Value Date   CHOL 171 12/27/2012   HDL 37.80* 12/27/2012   LDLCALC 101* 12/27/2012   TRIG 161.0* 12/27/2012   CHOLHDL 5 12/27/2012  Diet compliance: moderate, same as usual.  Exercise:Limited, not doing any walking due to leg issues  Other complaints:   Hypothyroid: Stable on current dose levo.  Lab Results  Component Value Date   TSH 2.01 12/27/2012   Prediabetes: ? Due for re-eval.  She has not been eating as much sweets since husband died.  COPD, stable:on advair daily.    Review of Systems  Constitutional: Negative for fever and fatigue.  HENT: Negative for ear pain.  Eyes: Negative for pain.  Respiratory: Negative for chest tightness and shortness of  breath.  Cardiovascular: Negative for chest pain, palpitations and leg swelling.  Gastrointestinal: Negative for abdominal pain.  Genitourinary: Negative for dysuria.  Objective:   Physical Exam  Constitutional: Vital signs are normal. She appears well-developed and well-nourished. She is cooperative. Non-toxic appearance. She does not appear ill. No distress.  HENT:  Head: Normocephalic.  Right Ear: Hearing, tympanic membrane, external ear and ear canal normal.  Left Ear: Hearing, tympanic membrane, external ear and ear canal normal.  Nose: Nose normal.  Eyes: Conjunctivae, EOM and lids are normal. Pupils are equal, round, and reactive to light. No foreign bodies found.  Neck: Trachea normal and normal range of motion. Neck supple. Carotid bruit is not present. No mass and no thyromegaly present.  Cardiovascular: Normal rate, regular rhythm, S1 normal, S2 normal, normal heart sounds and intact distal pulses. Exam reveals no gallop.  No murmur heard.  Pulmonary/Chest: Effort normal and breath sounds normal. No respiratory distress. She has no wheezes. She has no rhonchi. She has no rales.  Abdominal: Soft. Normal appearance and bowel sounds are normal. She exhibits no distension, no fluid wave, no abdominal bruit and no mass. There is no hepatosplenomegaly. There is no tenderness. There is no rebound, no guarding and no CVA tenderness. No hernia.  Genitourinary: B breast no masses, no nipple changes. No DVE/PAP  Lymphadenopathy:  She has no cervical adenopathy.  She has no axillary adenopathy.  Neurological: She is alert. She has decreased strength in lower extremeties. No cranial nerve deficit or sensory deficit. She is unsteady on her  feet. She cannot get up on table. No focal ttp in low back, cannot perform SLR.  Skin: Skin is warm, dry and intact. Erythema and dry plaque noted at site of scratch on left neck...3cm x 2 cm  Psychiatric: Her speech is normal and behavior is normal. Judgment  normal. Her mood appears not anxious. Cognition and memory are normal. She does not exhibit a depressed mood.  Assessment & Plan:   Annual Medicare Wellness: The patient's preventative maintenance and recommended screening tests for an annual wellness exam were reviewed in full today.  Brought up to date unless services declined.  Counselled on the importance of diet, exercise, and its role in overall health and mortality.  The patient's FH and SH was reviewed, including their home life, tobacco status, and drug and alcohol status.   Vaccines: Uptodate with flu (given today), shingles and pneumovax. Due for prevnar Refuses Td  Mammo: nml 01/2011.Marland Kitchen Hx of breast cancer 1986.Marland Kitchen  She is not interested in further mammograms and breast exams given she would not do aggressive treatment.Colon: last 2003, no further indicated, no family history  DVE/pap: not indicated mk DXA: sister nml, no family history... Has never had. She refuses this at this time.

## 2014-01-10 NOTE — Progress Notes (Signed)
Pre visit review using our clinic review tool, if applicable. No additional management support is needed unless otherwise documented below in the visit note. 

## 2014-01-10 NOTE — Patient Instructions (Signed)
Schedule a visit to discuss leg pain if really bothering you. Schedule fasting labs in next few weeks.

## 2014-01-11 DIAGNOSIS — Z0279 Encounter for issue of other medical certificate: Secondary | ICD-10-CM

## 2014-01-13 ENCOUNTER — Encounter (HOSPITAL_COMMUNITY): Payer: Self-pay | Admitting: Emergency Medicine

## 2014-01-13 ENCOUNTER — Emergency Department (HOSPITAL_COMMUNITY)
Admission: EM | Admit: 2014-01-13 | Discharge: 2014-01-13 | Disposition: A | Discharge status: Home / Self Care | Payer: Medicare Other | Attending: Emergency Medicine | Admitting: Emergency Medicine

## 2014-01-13 DIAGNOSIS — M25559 Pain in unspecified hip: Secondary | ICD-10-CM | POA: Diagnosis present

## 2014-01-13 DIAGNOSIS — Z79899 Other long term (current) drug therapy: Secondary | ICD-10-CM | POA: Diagnosis not present

## 2014-01-13 DIAGNOSIS — J4489 Other specified chronic obstructive pulmonary disease: Secondary | ICD-10-CM | POA: Insufficient documentation

## 2014-01-13 DIAGNOSIS — Z87891 Personal history of nicotine dependence: Secondary | ICD-10-CM | POA: Diagnosis not present

## 2014-01-13 DIAGNOSIS — E039 Hypothyroidism, unspecified: Secondary | ICD-10-CM | POA: Insufficient documentation

## 2014-01-13 DIAGNOSIS — Z8619 Personal history of other infectious and parasitic diseases: Secondary | ICD-10-CM | POA: Diagnosis not present

## 2014-01-13 DIAGNOSIS — Z853 Personal history of malignant neoplasm of breast: Secondary | ICD-10-CM | POA: Diagnosis not present

## 2014-01-13 DIAGNOSIS — Z87440 Personal history of urinary (tract) infections: Secondary | ICD-10-CM | POA: Insufficient documentation

## 2014-01-13 DIAGNOSIS — Z791 Long term (current) use of non-steroidal anti-inflammatories (NSAID): Secondary | ICD-10-CM | POA: Diagnosis not present

## 2014-01-13 DIAGNOSIS — M79609 Pain in unspecified limb: Secondary | ICD-10-CM | POA: Diagnosis not present

## 2014-01-13 DIAGNOSIS — J449 Chronic obstructive pulmonary disease, unspecified: Secondary | ICD-10-CM | POA: Insufficient documentation

## 2014-01-13 DIAGNOSIS — I10 Essential (primary) hypertension: Secondary | ICD-10-CM | POA: Diagnosis not present

## 2014-01-13 DIAGNOSIS — M25551 Pain in right hip: Secondary | ICD-10-CM

## 2014-01-13 DIAGNOSIS — G8929 Other chronic pain: Secondary | ICD-10-CM | POA: Diagnosis not present

## 2014-01-13 MED ORDER — HYDROCODONE-ACETAMINOPHEN 5-325 MG PO TABS
2.0000 | ORAL_TABLET | Freq: Once | ORAL | Status: AC
  Administered 2014-01-13: 2 via ORAL
  Filled 2014-01-13: qty 2

## 2014-01-13 MED ORDER — HYDROCODONE-ACETAMINOPHEN 5-325 MG PO TABS: 1.0000 | ORAL_TABLET | Freq: Four times a day (QID) | ORAL | Status: DC | PRN

## 2014-01-13 MED ORDER — ONDANSETRON HCL 4 MG PO TABS: 4.0000 mg | ORAL_TABLET | Freq: Four times a day (QID) | ORAL | Status: DC

## 2014-01-13 NOTE — ED Provider Notes (Signed)
CSN: [id number]     Arrival date & time 01/13/14  1139 History   First MD Initiated Contact with Patient 01/13/14 1500     Chief Complaint  Patient presents with  . Leg Pain  . Hip Pain     (Consider location/radiation/quality/duration/timing/severity/associated sxs/prior Treatment) HPI Comments: Patient presents to the ED with a chief complaint of right hip and leg pain.  She states that she has been having this pain for the past several years.  She denies any known MOI.  She has not had any falls.  It waxes and wanes.  She states that earlier this week the pain started to come back.  She states that today the pain was so great that she did not want to stand.  She states that she normally takes tylenol, but this hasn't helped over the past 2 days.  She denies fevers, chills, chest pain, SOB, abdominal pain, back pain, dysuria, rash, or other symptoms.  She stays at home with her son who is currently looking after her.  The history is provided by the patient. No language interpreter was used.    Past Medical History  Diagnosis Date  . Other abnormal glucose   . Unspecified vitamin D deficiency   . Urinary tract infection, site not specified   . Chronic airway obstruction, not elsewhere classified   . Persistent disorder of initiating or maintaining sleep   . Pain in joint, shoulder region   . Palpitations   . Bacterial pneumonia, unspecified   . Candidiasis of mouth   . Obstructive chronic bronchitis with exacerbation   . Unspecified viral infection, in conditions classified elsewhere and of unspecified site   . Lumbago   . Unspecified essential hypertension   . Hyposmolality and/or hyponatremia   . Unspecified hypothyroidism   . Cancer , breast   . Encephalitis 1961   Past Surgical History  Procedure Laterality Date  . Abi  04/19/05    Negative  . Venous doppler  04/19/05    LE normal  . Back mri  2005    mild bulging disks  . Mastectomy, radical     Family History   Problem Relation Age of Onset  . Rheum arthritis Father   . Stroke Mother   . Diabetes Mother   . Healthy Sister   . Healthy Sister   . Liver cancer      Aunt  . Stomach cancer      Aunt   History  Substance Use Topics  . Smoking status: Former Smoker -- 0.50 packs/day for 42 years    Types: Cigarettes  . Smokeless tobacco: Never Used  . Alcohol Use: No   OB History   Grav Para Term Preterm Abortions TAB SAB Ect Mult Living                 Review of Systems  Constitutional: Negative for fever and chills.  Respiratory: Negative for shortness of breath.   Cardiovascular: Negative for chest pain.  Gastrointestinal: Negative for nausea, vomiting, diarrhea and constipation.  Genitourinary: Negative for dysuria.  Musculoskeletal: Positive for arthralgias.  All other systems reviewed and are negative.     Allergies  Meperidine hcl  Home Medications   Prior to Admission medications   Medication Sig Start Date End Date Taking? Authorizing Provider  amLODipine (NORVASC) 10 MG tablet TAKE 1 TABLET BY MOUTH DAILY 12/17/13   Amy Cletis Athens, MD  clorazepate (TRANXENE) 3.75 MG tablet TAKE 1 TABLET BY MOUTH EVERY NIGHT  AT BEDTIME AS NEEDED. 08/13/13   Amy Cletis Athens, MD  furosemide (LASIX) 40 MG tablet TAKE 1 TABLET BY MOUTH DAILY 10/31/13   Amy Cletis Athens, MD  levothyroxine (SYNTHROID, LEVOTHROID) 88 MCG tablet TAKE 1 TABLET BY MOUTH DAILY 12/26/13   Amy Cletis Athens, MD  meloxicam (MOBIC) 7.5 MG tablet Take 1-2 tablets (7.5-15 mg total) by mouth daily. 12/29/12   Amy Cletis Athens, MD  metoprolol succinate (TOPROL-XL) 50 MG 24 hr tablet TAKE 1 TABLET BY MOUTH DAILY    Amy E Bedsole, MD  valsartan (DIOVAN) 160 MG tablet TAKE 1 TABLET BY MOUTH DAILY *GENERIC DIOVAN* 11/19/13   Amy E Diona Browner, MD   BP 147/54  Pulse 70  Temp(Src) 98 F (36.7 C) (Oral)  Resp 18  Ht 5\' 2"  (1.575 m)  Wt 175 lb (79.379 kg)  BMI 32.00 kg/m2  SpO2 95% Physical Exam  Nursing note and vitals  reviewed. Constitutional: She is oriented to person, place, and time. She appears well-developed and well-nourished.  HENT:  Head: Normocephalic and atraumatic.  Eyes: Conjunctivae and EOM are normal. Pupils are equal, round, and reactive to light.  Neck: Normal range of motion. Neck supple.  Cardiovascular: Normal rate and regular rhythm.  Exam reveals no gallop and no friction rub.   No murmur heard. Intact distal pulses  Pulmonary/Chest: Effort normal and breath sounds normal. No respiratory distress. She has no wheezes. She has no rales. She exhibits no tenderness.  CTAB  Abdominal: Soft. She exhibits no distension and no mass. There is no tenderness. There is no rebound and no guarding.  No focal abdominal tenderness, no RLQ tenderness or pain at McBurney's point, no RUQ tenderness or Murphy's sign, no left-sided abdominal tenderness, no fluid wave, or signs of peritonitis   Musculoskeletal: Normal range of motion. She exhibits no edema and no tenderness.  Right hip ROM and strength 5/5, no bony abnormality or deformity  Right knee ROM and strength 5/5, no bony abnormality or deformity, no sign of DVT or septic joint   Neurological: She is alert and oriented to person, place, and time.  Sensation intact  Skin: Skin is warm and dry.  No rash  Psychiatric: She has a normal mood and affect. Her behavior is normal. Judgment and thought content normal.    ED Course  Procedures (including critical care time) Labs Review Labs Reviewed - No data to display  Imaging Review No results found.   EKG Interpretation None      MDM   Final diagnoses:  Hip pain, right    Patient with chronic right hip pain.  No new MOI.  No falls.  Normally takes tylenol.  I will give some hydrocodone and recommend ortho follow-up.  She has not had any falls or MOI.  I highly doubt any fractures.  She has been walking all week except today because of pain. No rashes, no swelling, no reason to suspect  DVT.  Will treat pain and reassess.  Seen by PCP 10 days ago for annual exam.  Given "clean bill of health."  4:13 PM Patient ambulates.  She is feeling better.  DC to home with ortho follow-up.  Patient understands and agrees with the plan she is stable and ready for discharge.  Patient seen by and discussed with Dr. Johnney Killian, who agrees with the plan.    Montine Circle, PA-C 01/13/14 1623

## 2014-01-13 NOTE — Discharge Instructions (Signed)

## 2014-01-13 NOTE — ED Provider Notes (Signed)
Medical screening examination/treatment/procedure(s) were conducted as a shared visit with non-physician practitioner(s) and myself.  I personally evaluated the patient during the encounter.   EKG Interpretation None     I have seen and evaluated the patient and performed physical examination. The pain does not localize to any one joint or region aside from diffusely throughout the right upper leg. I have gone through palpation and range of motion at the hip and the knee and lower back, the soft tissues are normal, there is no localizing pain on the exam at this time. I agree with the plan to treat for pain and seek orthopedic followup.  Charlesetta Shanks, MD 01/13/14 [phone]

## 2014-01-13 NOTE — ED Notes (Signed)
Pt ambulated in hall without difficulty.

## 2014-01-13 NOTE — ED Notes (Signed)
Pt presents to department for evaluation of R leg and hip pain. States difficulty walking this morning. Describes pain as stiff sensation. 8/10 pain upon arrival to ED. Pt is alert and oriented x4. NAD.

## 2014-01-14 NOTE — ED Provider Notes (Signed)
Medical screening examination/treatment/procedure(s) were conducted as a shared visit with non-physician practitioner(s) and myself.  I personally evaluated the patient during the encounter.   EKG Interpretation None       Charlesetta Shanks, MD 01/14/14 [phone]

## 2014-01-22 ENCOUNTER — Other Ambulatory Visit: Payer: Self-pay | Admitting: Family Medicine

## 2014-01-22 NOTE — Telephone Encounter (Addendum)
Last office visit 01/10/2014.  Last TSH 12/27/2012.  No future lab appointments scheduled.  Ok to refill?

## 2014-02-18 ENCOUNTER — Other Ambulatory Visit: Payer: Self-pay | Admitting: Family Medicine

## 2014-02-22 ENCOUNTER — Telehealth: Payer: Self-pay | Admitting: Family Medicine

## 2014-02-22 ENCOUNTER — Encounter: Payer: Self-pay | Admitting: Family Medicine

## 2014-02-22 ENCOUNTER — Ambulatory Visit (INDEPENDENT_AMBULATORY_CARE_PROVIDER_SITE_OTHER): Payer: Medicare Other | Admitting: Family Medicine

## 2014-02-22 VITALS — BP 104/60 | HR 75 | Temp 98.3°F | Ht 62.0 in | Wt 175.5 lb

## 2014-02-22 DIAGNOSIS — Z23 Encounter for immunization: Secondary | ICD-10-CM

## 2014-02-22 DIAGNOSIS — S51001A Unspecified open wound of right elbow, initial encounter: Secondary | ICD-10-CM

## 2014-02-22 MED ORDER — MUPIROCIN 2 % EX OINT: 1.0000 "application " | TOPICAL_OINTMENT | Freq: Two times a day (BID) | CUTANEOUS | Status: DC

## 2014-02-22 NOTE — Progress Notes (Signed)
Pre visit review using our clinic review tool, if applicable. No additional management support is needed unless otherwise documented below in the visit note. 

## 2014-02-22 NOTE — Progress Notes (Signed)
   Subjective:    Patient ID: Diana [name], female    DOB: 03/26/1924, 80 y.o.   MRN: [id number]  HPI  80 year old female with history of balance issues presents following injury on right elbow 3 days ago. Since then she has had redness spread, discharge from wound tht is clear. No fever. She has not noted any spreading. Mildly painful, itches. Wound is warm.  Feels well otherwise No CP, no SOB.Marland Kitchen   Review of Systems  Constitutional: Negative for fever and fatigue.  HENT: Negative for ear pain.   Eyes: Negative for pain.  Respiratory: Negative for chest tightness and shortness of breath.   Cardiovascular: Negative for chest pain, palpitations and leg swelling.  Gastrointestinal: Negative for abdominal pain.  Genitourinary: Negative for dysuria.       Objective:   Physical Exam  Constitutional: Vital signs are normal. She appears well-developed and well-nourished. She is cooperative.  Non-toxic appearance. She does not appear ill. No distress.  HENT:  Head: Normocephalic.  Right Ear: Hearing, tympanic membrane, external ear and ear canal normal. Tympanic membrane is not erythematous, not retracted and not bulging.  Left Ear: Hearing, tympanic membrane, external ear and ear canal normal. Tympanic membrane is not erythematous, not retracted and not bulging.  Nose: No mucosal edema or rhinorrhea. Right sinus exhibits no maxillary sinus tenderness and no frontal sinus tenderness. Left sinus exhibits no maxillary sinus tenderness and no frontal sinus tenderness.  Mouth/Throat: Uvula is midline, oropharynx is clear and moist and mucous membranes are normal.  Eyes: Conjunctivae, EOM and lids are normal. Pupils are equal, round, and reactive to light. Lids are everted and swept, no foreign bodies found.  Neck: Trachea normal and normal range of motion. Neck supple. Carotid bruit is not present. No thyroid mass and no thyromegaly present.  Cardiovascular: Normal rate, regular rhythm, S1  normal, S2 normal, normal heart sounds, intact distal pulses and normal pulses.  Exam reveals no gallop and no friction rub.   No murmur heard. Pulmonary/Chest: Effort normal and breath sounds normal. No tachypnea. No respiratory distress. She has no decreased breath sounds. She has no wheezes. She has no rhonchi. She has no rales.  Abdominal: Soft. Normal appearance and bowel sounds are normal. There is no tenderness.  Neurological: She is alert.  Skin: Skin is warm, dry and intact. No rash noted.     5 cm diameter red area, no wramth central healing abrasion, some swelling in elbow extending down 9 cm diameter, no pus  Psychiatric: Her speech is normal and behavior is normal. Judgment and thought content normal. Her mood appears not anxious. Cognition and memory are normal. She does not exhibit a depressed mood.          Assessment & Plan:

## 2014-02-22 NOTE — Telephone Encounter (Signed)
Pt scheduled work in appt with Dr Diona Browner today; pt can be here between 11:30 AM and 12 noon.

## 2014-02-22 NOTE — Assessment & Plan Note (Signed)
No clear infection, redness has been stable and appears most like skin damage from injury with a little surrounding swelling.  Will put on mupirocin ointment just in case infection starting given weekend. Close follow up early next week.

## 2014-02-22 NOTE — Telephone Encounter (Signed)
Patient Information:  Caller Name: Diana [name]  Phone: [phone]  Patient: Diana, [name]  Gender: Female  DOB: [birth year]  Age: 80 Years  PCP: Eliezer Lofts (Family Practice)  Office Follow Up:  Does the office need to follow up with this patient?: Yes  Instructions For The Office: No appt available at [facility] or at the [place] office .  Please contact for appt time and evaluation of arm.  RN Note:  No appt available at [facility] or at the [place] office.  Please contact for appt time and evaluation of arm.  Symptoms  Reason For Call & Symptoms: Patient states she struck her Right Arm (top of arm near elbow) against the storm door on Monday 02/18/14.  She states it "pushed the skin up" . She pulled the skin back down but appears to be gone now. She treated with soap and water and treated with peroxide and polysporin ointment.  She has then applied Mercuricome. It is draining and will not dry up.  Red and drainage., warm to touch..  Last Tetanus unknown.  Reviewed Health History In EMR: Yes  Reviewed Medications In EMR: Yes  Reviewed Allergies In EMR: Yes  Reviewed Surgeries / Procedures: Yes  Date of Onset of Symptoms: 02/18/2014  Treatments Tried: soap and water, polysporin ointment Peroxide  Treatments Tried Worked: No  Guideline(s) Used:  Skin Injury  Disposition Per Guideline:   See Today in Office  Reason For Disposition Reached:   Patient wants to be seen  Advice Given:  Antibiotic Ointment  Apply an Antibiotic Ointment (e.g., OTC Bacitracin), covered by a Band-Aid or dressing. Change daily or if it becomes wet.  Option: A TEFLA dressing won't stick to the wound when it is removed.  Pain Medicines:  For pain relief, you can take either acetaminophen, ibuprofen, or naproxen.  They are over-the-counter (OTC) pain drugs. You can buy them at the drugstore.  Acetaminophen (e.g., Tylenol):  Regular Strength Tylenol: Take 650 mg (two 325 mg pills) by mouth  every 4-6 hours as needed. Each Regular Strength Tylenol pill has 325 mg of acetaminophen.  Call Back If:  Looks infected (pus, redness, increasing tenderness)  You become worse.  Clean Cuts and Scrapes - Tetanus Booster Needed Every 10 Years  Individuals with clean minor wounds AND who have previously had 3 or more tetanus shots (full series), need a booster every 10 years.  Examples of minor wounds include a minor scrape or scratch, a shallow cut from a clean knife blade, or a small glass cut that happened while washing dishes.  You should try to get the tetanus booster within three days of the injury.  RN Overrode Recommendation:  Make Appointment  No appt available at [facility] or at the [place] office.  Please contact for appt time and evaluation of arm.

## 2014-02-22 NOTE — Patient Instructions (Signed)
Start mupirocin antibiotic ointment. Hold peroxide and current topical antibitoic.  Close follow up next Tuesday, but call sooner if redness spreading or fever.

## 2014-02-26 ENCOUNTER — Ambulatory Visit: Payer: Medicare Other | Admitting: Family Medicine

## 2014-02-26 ENCOUNTER — Other Ambulatory Visit: Payer: Self-pay | Admitting: Family Medicine

## 2014-03-07 ENCOUNTER — Telehealth: Payer: Self-pay | Admitting: Family Medicine

## 2014-03-07 DIAGNOSIS — R7303 Prediabetes: Secondary | ICD-10-CM

## 2014-03-07 DIAGNOSIS — E559 Vitamin D deficiency, unspecified: Secondary | ICD-10-CM

## 2014-03-07 DIAGNOSIS — E038 Other specified hypothyroidism: Secondary | ICD-10-CM

## 2014-03-07 NOTE — Telephone Encounter (Signed)
-----   Message from Ellamae Sia sent at 03/07/2014 11:29 AM EST ----- Regarding: Lab orders for Friday, 11.20.15 Patient is scheduled for CPX labs, please order future labs, Thanks , Karna Christmas

## 2014-03-08 ENCOUNTER — Other Ambulatory Visit (INDEPENDENT_AMBULATORY_CARE_PROVIDER_SITE_OTHER): Payer: Medicare Other

## 2014-03-08 DIAGNOSIS — E038 Other specified hypothyroidism: Secondary | ICD-10-CM

## 2014-03-08 DIAGNOSIS — E559 Vitamin D deficiency, unspecified: Secondary | ICD-10-CM

## 2014-03-08 DIAGNOSIS — R7303 Prediabetes: Secondary | ICD-10-CM

## 2014-03-08 DIAGNOSIS — R7309 Other abnormal glucose: Secondary | ICD-10-CM

## 2014-03-08 LAB — TSH: TSH: 4.96 u[IU]/mL — ABNORMAL HIGH (ref 0.35–4.50)

## 2014-03-08 LAB — COMPREHENSIVE METABOLIC PANEL
ALBUMIN: 3.7 g/dL (ref 3.5–5.2)
ALK PHOS: 96 U/L (ref 39–117)
ALT: 14 U/L (ref 0–35)
AST: 16 U/L (ref 0–37)
BUN: 11 mg/dL (ref 6–23)
CO2: 28 mEq/L (ref 19–32)
Calcium: 9.2 mg/dL (ref 8.4–10.5)
Chloride: 97 mEq/L (ref 96–112)
Creatinine, Ser: 0.9 mg/dL (ref 0.4–1.2)
GFR: 61.2 mL/min (ref >60.00)
Glucose, Bld: 120 mg/dL — ABNORMAL HIGH (ref 70–99)
Potassium: 3.7 mEq/L (ref 3.5–5.1)
SODIUM: 134 meq/L — AB (ref 135–145)
TOTAL PROTEIN: 7.8 g/dL (ref 6.0–8.3)
Total Bilirubin: 0.6 mg/dL (ref 0.2–1.2)

## 2014-03-08 LAB — HEMOGLOBIN A1C: Hgb A1c MFr Bld: 6.3 % (ref 4.6–6.5)

## 2014-03-08 LAB — VITAMIN D 25 HYDROXY (VIT D DEFICIENCY, FRACTURES): VITD: 22.13 ng/mL — ABNORMAL LOW (ref 30.00–100.00)

## 2014-03-11 ENCOUNTER — Other Ambulatory Visit: Payer: Self-pay | Admitting: Family Medicine

## 2014-03-11 DIAGNOSIS — E039 Hypothyroidism, unspecified: Secondary | ICD-10-CM

## 2014-03-11 MED ORDER — VITAMIN D (ERGOCALCIFEROL) 1.25 MG (50000 UNIT) PO CAPS: 50000.0000 [IU] | ORAL_CAPSULE | ORAL | Status: DC

## 2014-03-11 MED ORDER — LEVOTHYROXINE SODIUM 100 MCG PO TABS: 100.0000 ug | ORAL_TABLET | Freq: Every day | ORAL | Status: DC

## 2014-03-11 NOTE — Addendum Note (Signed)
Addended by: Carter Kitten on: 03/11/2014 01:03 PM   Modules accepted: Orders

## 2014-03-11 NOTE — Patient Instructions (Signed)
Have her increase thyroid med. Sent in new dose. Return for repeat TSH in 4 weeks.

## 2014-03-19 ENCOUNTER — Other Ambulatory Visit: Payer: Self-pay | Admitting: Family Medicine

## 2014-07-16 ENCOUNTER — Other Ambulatory Visit: Payer: Self-pay | Admitting: Family Medicine

## 2014-08-20 ENCOUNTER — Other Ambulatory Visit: Payer: Self-pay | Admitting: Family Medicine

## 2014-08-27 ENCOUNTER — Encounter: Payer: Self-pay | Admitting: Family Medicine

## 2014-08-27 ENCOUNTER — Ambulatory Visit (INDEPENDENT_AMBULATORY_CARE_PROVIDER_SITE_OTHER): Payer: Medicare Other | Admitting: Family Medicine

## 2014-08-27 VITALS — BP 128/70 | HR 60 | Temp 97.2°F | Ht 62.0 in | Wt 176.0 lb

## 2014-08-27 DIAGNOSIS — E559 Vitamin D deficiency, unspecified: Secondary | ICD-10-CM | POA: Diagnosis not present

## 2014-08-27 DIAGNOSIS — R42 Dizziness and giddiness: Secondary | ICD-10-CM | POA: Diagnosis not present

## 2014-08-27 DIAGNOSIS — Z79899 Other long term (current) drug therapy: Secondary | ICD-10-CM

## 2014-08-27 DIAGNOSIS — R5383 Other fatigue: Secondary | ICD-10-CM | POA: Insufficient documentation

## 2014-08-27 DIAGNOSIS — E038 Other specified hypothyroidism: Secondary | ICD-10-CM

## 2014-08-27 DIAGNOSIS — R21 Rash and other nonspecific skin eruption: Secondary | ICD-10-CM

## 2014-08-27 NOTE — Progress Notes (Signed)
Pre visit review using our clinic review tool, if applicable. No additional management support is needed unless otherwise documented below in the visit note. 

## 2014-08-27 NOTE — Progress Notes (Signed)
   Subjective:    Patient ID: Diana [name], female    DOB: 05/28/1923, 81 y.o.   MRN: [id number]  HPI  81 year old female with history of HTN, hyponatremia, hypothyroid, presents with new onset dizziness. Feels "swimmyheaded" in last week.  She reports she thinks she is doing too much one day then pays for it the next. No vertigo. No falls. She is tired a lot. No bleeding, no change in SOB, no new chest pain.  She was concerned BP was to high  BP well controlled. BP Readings from Last 3 Encounters:  08/27/14 128/70  02/22/14 104/60  01/13/14 142/56   Redness and itching x several week between breasts. . Started treating with cortisone cream OTC   Improved a lot but not gone.  Review of Systems  Constitutional: Negative for fever and fatigue.  HENT: Negative for ear pain.   Eyes: Negative for pain.  Respiratory: Positive for shortness of breath. Negative for chest tightness.   Cardiovascular: Negative for chest pain, palpitations and leg swelling.  Gastrointestinal: Negative for abdominal pain.  Genitourinary: Negative for dysuria.  Neurological: Positive for dizziness and weakness. Negative for syncope, numbness and headaches.       Objective:   Physical Exam  Constitutional: Vital signs are normal. She appears well-developed and well-nourished. She is cooperative.  Non-toxic appearance. She does not appear ill. No distress.  Elderly female in NAD, using walker, slowed gait  HENT:  Head: Normocephalic.  Right Ear: Hearing, tympanic membrane, external ear and ear canal normal. Tympanic membrane is not erythematous, not retracted and not bulging.  Left Ear: Hearing, tympanic membrane, external ear and ear canal normal. Tympanic membrane is not erythematous, not retracted and not bulging.  Nose: No mucosal edema or rhinorrhea. Right sinus exhibits no maxillary sinus tenderness and no frontal sinus tenderness. Left sinus exhibits no maxillary sinus tenderness and no frontal  sinus tenderness.  Mouth/Throat: Uvula is midline, oropharynx is clear and moist and mucous membranes are normal.  Eyes: Conjunctivae, EOM and lids are normal. Pupils are equal, round, and reactive to light. Lids are everted and swept, no foreign bodies found.  Neck: Trachea normal and normal range of motion. Neck supple. Carotid bruit is not present. No thyroid mass and no thyromegaly present.  Cardiovascular: Normal rate, regular rhythm, S1 normal, S2 normal, normal heart sounds, intact distal pulses and normal pulses.  Exam reveals no gallop and no friction rub.   No murmur heard. Pulmonary/Chest: Effort normal and breath sounds normal. No tachypnea. No respiratory distress. She has no decreased breath sounds. She has no wheezes. She has no rhonchi. She has no rales.  Abdominal: Soft. Normal appearance and bowel sounds are normal. There is no tenderness.  Neurological: She is alert.  Skin: Skin is warm, dry and intact. Rash noted.     pink flaky skin between breasts  Psychiatric: Her speech is normal and behavior is normal. Judgment and thought content normal. Her mood appears not anxious. Cognition and memory are normal. She does not exhibit a depressed mood.          Assessment & Plan:

## 2014-08-27 NOTE — Patient Instructions (Addendum)
Can use the mometasone cream you have for the rash between the breasts.  Stop at lab on way out. Call if new changes.

## 2014-08-27 NOTE — Assessment & Plan Note (Signed)
Not clearly vertigo. Eval with labs.

## 2014-08-27 NOTE — Assessment & Plan Note (Signed)
Treat with mometasone cream pt already has for no more than 2 weeks. Call if not improving.

## 2014-08-28 LAB — COMPREHENSIVE METABOLIC PANEL
ALT: 16 U/L (ref 0–35)
AST: 19 U/L (ref 0–37)
Albumin: 4.1 g/dL (ref 3.5–5.2)
Alkaline Phosphatase: 83 U/L (ref 39–117)
BUN: 13 mg/dL (ref 6–23)
CALCIUM: 10.1 mg/dL (ref 8.4–10.5)
CO2: 30 meq/L (ref 19–32)
Chloride: 95 mEq/L — ABNORMAL LOW (ref 96–112)
Creatinine, Ser: 0.84 mg/dL (ref 0.40–1.20)
GFR: 67.9 mL/min (ref >60.00)
GLUCOSE: 88 mg/dL (ref 70–99)
Potassium: 4.1 mEq/L (ref 3.5–5.1)
Sodium: 130 mEq/L — ABNORMAL LOW (ref 135–145)
Total Bilirubin: 0.3 mg/dL (ref 0.2–1.2)
Total Protein: 8.2 g/dL (ref 6.0–8.3)

## 2014-08-28 LAB — CBC WITH DIFFERENTIAL/PLATELET
BASOS PCT: 4.9 % — AB (ref 0.0–3.0)
Basophils Absolute: 0.4 10*3/uL — ABNORMAL HIGH (ref 0.0–0.1)
EOS PCT: 1.9 % (ref 0.0–5.0)
Eosinophils Absolute: 0.2 10*3/uL (ref 0.0–0.7)
HCT: 40.5 % (ref 36.0–46.0)
HEMOGLOBIN: 13.8 g/dL (ref 12.0–15.0)
LYMPHS PCT: 32.8 % (ref 12.0–46.0)
Lymphs Abs: 2.8 10*3/uL (ref 0.7–4.0)
MCHC: 34 g/dL (ref 30.0–36.0)
MCV: 91.5 fl (ref 78.0–100.0)
Monocytes Absolute: 0.6 10*3/uL (ref 0.1–1.0)
Monocytes Relative: 6.6 % (ref 3.0–12.0)
Neutro Abs: 4.6 10*3/uL (ref 1.4–7.7)
Neutrophils Relative %: 53.8 % (ref 43.0–77.0)
Platelets: 234 10*3/uL (ref 150.0–400.0)
RBC: 4.43 Mil/uL (ref 3.87–5.11)
RDW: 13.7 % (ref 11.5–15.5)
WBC: 8.6 10*3/uL (ref 4.0–10.5)

## 2014-08-28 LAB — TSH: TSH: 2 u[IU]/mL (ref 0.35–4.50)

## 2014-08-28 LAB — VITAMIN B12: Vitamin B-12: 347 pg/mL (ref 211–911)

## 2014-08-28 LAB — VITAMIN D 25 HYDROXY (VIT D DEFICIENCY, FRACTURES): VITD: 24.2 ng/mL — AB (ref 30.00–100.00)

## 2014-09-12 ENCOUNTER — Telehealth: Payer: Self-pay | Admitting: Family Medicine

## 2014-09-12 ENCOUNTER — Other Ambulatory Visit (INDEPENDENT_AMBULATORY_CARE_PROVIDER_SITE_OTHER): Payer: Medicare Other

## 2014-09-12 DIAGNOSIS — E871 Hypo-osmolality and hyponatremia: Secondary | ICD-10-CM

## 2014-09-12 NOTE — Telephone Encounter (Signed)
-----   Message from Ignacia Marvel, Haines sent at 09/10/2014 10:02 AM EDT ----- Please order labs for upcoming lab appt on 09/12/14. Thanks!

## 2014-09-12 NOTE — Telephone Encounter (Signed)
-----   Message from Ignacia Marvel, Wright sent at 09/10/2014 10:02 AM EDT ----- Please order labs for upcoming lab appt on 09/12/14. Thanks!

## 2014-09-13 LAB — SODIUM: SODIUM: 132 meq/L — AB (ref 135–145)

## 2014-09-24 ENCOUNTER — Other Ambulatory Visit: Payer: Self-pay | Admitting: Family Medicine

## 2014-09-24 NOTE — Telephone Encounter (Signed)
Last office visit 08/27/2014.  Tranxene not on current medication list.  Refill?

## 2014-09-24 NOTE — Telephone Encounter (Signed)
Ok, #30, 0 ref 

## 2014-10-04 DIAGNOSIS — H04123 Dry eye syndrome of bilateral lacrimal glands: Secondary | ICD-10-CM | POA: Diagnosis not present

## 2014-10-04 DIAGNOSIS — Z961 Presence of intraocular lens: Secondary | ICD-10-CM | POA: Diagnosis not present

## 2014-10-04 DIAGNOSIS — H26493 Other secondary cataract, bilateral: Secondary | ICD-10-CM | POA: Diagnosis not present

## 2014-10-04 DIAGNOSIS — H43813 Vitreous degeneration, bilateral: Secondary | ICD-10-CM | POA: Diagnosis not present

## 2014-10-15 ENCOUNTER — Other Ambulatory Visit (INDEPENDENT_AMBULATORY_CARE_PROVIDER_SITE_OTHER): Payer: Medicare Other

## 2014-10-15 DIAGNOSIS — E871 Hypo-osmolality and hyponatremia: Secondary | ICD-10-CM | POA: Diagnosis not present

## 2014-10-15 DIAGNOSIS — E559 Vitamin D deficiency, unspecified: Secondary | ICD-10-CM | POA: Diagnosis not present

## 2014-10-15 DIAGNOSIS — R7989 Other specified abnormal findings of blood chemistry: Secondary | ICD-10-CM | POA: Diagnosis not present

## 2014-10-15 LAB — SODIUM: Sodium: 134 mEq/L — ABNORMAL LOW (ref 135–145)

## 2014-10-15 LAB — VITAMIN D 25 HYDROXY (VIT D DEFICIENCY, FRACTURES): VITD: 22.59 ng/mL — ABNORMAL LOW (ref 30.00–100.00)

## 2015-01-15 ENCOUNTER — Other Ambulatory Visit: Payer: Self-pay | Admitting: Family Medicine

## 2015-01-15 NOTE — Telephone Encounter (Signed)
Please call and schedule Medicare Wellness with fasting labs prior for Dr. Bedsole. 

## 2015-01-27 ENCOUNTER — Telehealth: Payer: Self-pay | Admitting: Family Medicine

## 2015-01-27 NOTE — Telephone Encounter (Signed)
Appointment scheduled for tomorrow morning with Dr. Damita Dunnings.

## 2015-01-27 NOTE — Telephone Encounter (Signed)
[facility]  Patient Name: Diana [name]  DOB: October 09, 1923    Initial Comment Caller states c/o dizziness   Nurse Assessment  Nurse: Harlow Mares, RN, Suanne Marker Date/Time Eilene Ghazi Time): 01/27/2015 3:04:52 PM  Confirm and document reason for call. If symptomatic, describe symptoms. ---Caller states c/o dizziness. Reports that this began when she awoke this morning. She went ahead and made a MD appt. (tomorrow afternoon).  Has the patient traveled out of the country within the last 30 days? ---No  Does the patient have any new or worsening symptoms? ---Yes  Will a triage be completed? ---Yes  Related visit to physician within the last 2 weeks? ---No  Does the PT have any chronic conditions? (i.e. diabetes, asthma, etc.) ---Yes  List chronic conditions. ---uses a walker     Guidelines    Guideline Title Affirmed Question Affirmed Notes  Dizziness - Lightheadedness [1] MODERATE dizziness (e.g., interferes with normal activities) AND [2] has NOT been evaluated by physician for this (Exception: dizziness caused by heat exposure, sudden standing, or poor fluid intake)    Final Disposition User   See Physician within 24 Hours Harlow Mares, RN, [employer]    Referrals  REFERRED TO PCP OFFICE   Disagree/Comply: Leta Baptist

## 2015-01-28 ENCOUNTER — Ambulatory Visit (INDEPENDENT_AMBULATORY_CARE_PROVIDER_SITE_OTHER): Payer: Medicare Other | Admitting: Family Medicine

## 2015-01-28 ENCOUNTER — Encounter: Payer: Self-pay | Admitting: Family Medicine

## 2015-01-28 VITALS — BP 122/66 | HR 71 | Temp 98.7°F | Wt 180.0 lb

## 2015-01-28 DIAGNOSIS — H8110 Benign paroxysmal vertigo, unspecified ear: Secondary | ICD-10-CM

## 2015-01-28 DIAGNOSIS — Z23 Encounter for immunization: Secondary | ICD-10-CM | POA: Diagnosis not present

## 2015-01-28 NOTE — Progress Notes (Signed)
Pre visit review using our clinic review tool, if applicable. No additional management support is needed unless otherwise documented below in the visit note.  Sx started yesterday.  She has sig vertigo.  She wasn't presyncopal.  No CP.  No vomiting.  No ear pain.  Hard of hearing at baseline, longstanding.  Some long standing tinnitus.  Vertigo is clearly better today.  No falls in the last year.  Here with family today.  Using a walker at baseline.    H/o vertigo in the distant past that had resolved.   Meds, vitals, and allergies reviewed.   ROS: See HPI.  Otherwise, noncontributory.  GEN: nad, alert and oriented HEENT: mucous membranes moist, hard of hearing at baseline.  No TM erythema. OP and nasal exam wnl NECK: supple w/o LA CV: rrr. PULM: ctab, no inc wob ABD: soft, +bs EXT: no edema CN 2-12 wnl B, S/S/DTR wnl x4

## 2015-01-28 NOTE — Patient Instructions (Signed)
Take meclizine 12.5mg  up to twice a day if needed.   Likely vertigo.  This should resolve Take care.  Glad to see you.

## 2015-01-29 DIAGNOSIS — H8110 Benign paroxysmal vertigo, unspecified ear: Secondary | ICD-10-CM | POA: Insufficient documentation

## 2015-01-29 NOTE — Assessment & Plan Note (Signed)
Doesn't appear to be a BP issue.  D/w pt.   Likely BPV.  D/w pt about path/phys  Clearly better today.   Can use meclizine prn but will likely not need with this episode.  Routine cautions given.  Continue walker use.   Okay for outpatient f/u.  No need for imaging.  Reassuring exam.

## 2015-02-18 ENCOUNTER — Other Ambulatory Visit: Payer: Self-pay | Admitting: Family Medicine

## 2015-02-18 ENCOUNTER — Encounter: Payer: Self-pay | Admitting: Family Medicine

## 2015-02-18 ENCOUNTER — Ambulatory Visit (INDEPENDENT_AMBULATORY_CARE_PROVIDER_SITE_OTHER): Payer: Medicare Other | Admitting: Family Medicine

## 2015-02-18 VITALS — BP 145/62 | HR 71 | Temp 98.7°F | Ht 62.0 in | Wt 179.8 lb

## 2015-02-18 DIAGNOSIS — J329 Chronic sinusitis, unspecified: Secondary | ICD-10-CM | POA: Diagnosis not present

## 2015-02-18 DIAGNOSIS — B349 Viral infection, unspecified: Secondary | ICD-10-CM | POA: Diagnosis not present

## 2015-02-18 DIAGNOSIS — J42 Unspecified chronic bronchitis: Secondary | ICD-10-CM | POA: Diagnosis not present

## 2015-02-18 DIAGNOSIS — B9789 Other viral agents as the cause of diseases classified elsewhere: Secondary | ICD-10-CM

## 2015-02-18 NOTE — Patient Instructions (Signed)
Mucinex, plain twice daily  Start nasal saline spray OTC 2-3 times a day.  Can add flonase 2 prays per nostril daily if congestion not improving.  Call if fever, face pain, or not improving as expected.

## 2015-02-18 NOTE — Assessment & Plan Note (Signed)
No sign of bacterial infeciton. < 2 weeks, no fever and no face pain.  Will treat with mucolytic, saline irrigation and nasal steroid to prevent bacterial infection and to treat symptoms. Reviewed viral timeline for pt in detail.

## 2015-02-18 NOTE — Assessment & Plan Note (Signed)
No current flare of COPD.

## 2015-02-18 NOTE — Progress Notes (Signed)
Pre visit review using our clinic review tool, if applicable. No additional management support is needed unless otherwise documented below in the visit note. 

## 2015-02-18 NOTE — Progress Notes (Signed)
   Subjective:    Patient ID: Diana [name], female    DOB: 1923/11/18, 81 y.o.   MRN: [id number]  Cough This is a new problem. The current episode started in the past 7 days ( 1 week). The problem has been gradually worsening. The cough is non-productive. Associated symptoms include ear congestion, ear pain, nasal congestion and a sore throat. Pertinent negatives include no chest pain, chills, fever, headaches, myalgias, shortness of breath or wheezing. Associated symptoms comments: Soreness on roof of mouth  left ear pain Pressure in face. No face pain  small amount bilateral nose bleeding.. Nothing aggravates the symptoms. Risk factors: former smoker. She has tried OTC cough suppressant (coricidin) for the symptoms. The treatment provided no relief. Her past medical history is significant for COPD and environmental allergies. There is no history of asthma, bronchitis, emphysema or pneumonia.      Review of Systems  Constitutional: Negative for fever and chills.  HENT: Positive for ear pain and sore throat.   Respiratory: Positive for cough. Negative for shortness of breath and wheezing.   Cardiovascular: Negative for chest pain.  Musculoskeletal: Negative for myalgias.  Allergic/Immunologic: Positive for environmental allergies.  Neurological: Negative for headaches.       Objective:   Physical Exam  Constitutional: Vital signs are normal. She appears well-developed and well-nourished. She is cooperative.  Non-toxic appearance. She does not appear ill. No distress.  HENT:  Head: Normocephalic.  Right Ear: Hearing, external ear and ear canal normal. Tympanic membrane is not erythematous, not retracted and not bulging. A middle ear effusion is present.  Left Ear: Hearing, external ear and ear canal normal. Tympanic membrane is not erythematous, not retracted and not bulging. A middle ear effusion is present.  Nose: Mucosal edema and rhinorrhea present. Right sinus exhibits no  maxillary sinus tenderness and no frontal sinus tenderness. Left sinus exhibits no maxillary sinus tenderness and no frontal sinus tenderness.  Mouth/Throat: Uvula is midline and mucous membranes are normal. Posterior oropharyngeal erythema present. No oropharyngeal exudate or posterior oropharyngeal edema.  Eyes: Conjunctivae, EOM and lids are normal. Pupils are equal, round, and reactive to light. Lids are everted and swept, no foreign bodies found.  Neck: Trachea normal and normal range of motion. Neck supple. Carotid bruit is not present. No thyroid mass and no thyromegaly present.  Cardiovascular: Normal rate, regular rhythm, S1 normal, S2 normal, normal heart sounds, intact distal pulses and normal pulses.  Exam reveals no gallop and no friction rub.   No murmur heard. Pulmonary/Chest: Effort normal and breath sounds normal. No tachypnea. No respiratory distress. She has no decreased breath sounds. She has no wheezes. She has no rhonchi. She has no rales.  Neurological: She is alert.  Skin: Skin is warm, dry and intact. No rash noted.  Psychiatric: Her speech is normal and behavior is normal. Judgment normal. Her mood appears not anxious. Cognition and memory are normal. She does not exhibit a depressed mood.          Assessment & Plan:

## 2015-03-05 ENCOUNTER — Telehealth: Payer: Self-pay | Admitting: Family Medicine

## 2015-03-05 NOTE — Telephone Encounter (Signed)
Patient Name: Diana [name]  DOB: [birth year]    Initial Comment Caller states she is continuing to have cold symptoms and is hoarse by the middle of the day.   Nurse Assessment  Nurse: Leilani Merl, RN, Heather Date/Time (Eastern Time): 03/05/2015 9:25:22 AM  Confirm and document reason for call. If symptomatic, describe symptoms. ---Caller states that her cold symptoms are mostly gone, she is not coughing and in the morning she is fine but by the end of the day she is hoarse and her throat hurts from trying to talk.  Has the patient traveled out of the country within the last 30 days? ---Not Applicable  Does the patient have any new or worsening symptoms? ---Yes  Will a triage be completed? ---Yes  Related visit to physician within the last 2 weeks? ---Yes  Does the PT have any chronic conditions? (i.e. diabetes, asthma, etc.) ---Yes  List chronic conditions. ---see MR     Guidelines    Guideline Title Affirmed Question Affirmed Notes  Hoarseness Mild hoarseness (all triage questions negative)    Final Disposition User   Kirkman, RN, Nira Conn    Comments  Caller states that she wants an antibiotic called in to her pharmacy today, she feels she needs one since her hoarseness is not getting better.   Referrals  REFERRED TO PCP OFFICE  REFERRED TO PCP OFFICE   Disagree/Comply: Comply

## 2015-03-06 ENCOUNTER — Ambulatory Visit: Payer: Medicare Other | Admitting: Family Medicine

## 2015-03-06 MED ORDER — AZITHROMYCIN 250 MG PO TABS: ORAL_TABLET | ORAL | Status: DC

## 2015-03-06 NOTE — Telephone Encounter (Signed)
14 days or more of symtpoms.. Concern for bacterial infection.. Sent in course of antibitoics.

## 2015-03-06 NOTE — Telephone Encounter (Signed)
Ms. Olsson notified as instructed by telephone and that a Z-pak has been sent in to [facility].

## 2015-03-19 ENCOUNTER — Other Ambulatory Visit: Payer: Self-pay | Admitting: Family Medicine

## 2015-03-27 ENCOUNTER — Ambulatory Visit (INDEPENDENT_AMBULATORY_CARE_PROVIDER_SITE_OTHER): Payer: Medicare Other | Admitting: Family Medicine

## 2015-03-27 ENCOUNTER — Encounter: Payer: Self-pay | Admitting: Family Medicine

## 2015-03-27 VITALS — BP 132/60 | HR 64 | Temp 97.6°F | Wt 169.0 lb

## 2015-03-27 DIAGNOSIS — R05 Cough: Secondary | ICD-10-CM

## 2015-03-27 DIAGNOSIS — R002 Palpitations: Secondary | ICD-10-CM | POA: Diagnosis not present

## 2015-03-27 DIAGNOSIS — R490 Dysphonia: Secondary | ICD-10-CM | POA: Diagnosis not present

## 2015-03-27 DIAGNOSIS — R059 Cough, unspecified: Secondary | ICD-10-CM

## 2015-03-27 MED ORDER — BENZONATATE 200 MG PO CAPS: 200.0000 mg | ORAL_CAPSULE | Freq: Two times a day (BID) | ORAL | Status: DC | PRN

## 2015-03-27 NOTE — Assessment & Plan Note (Signed)
No further issues.  EKG NSR , no changes. Consider heart monitor if recurrent.

## 2015-03-27 NOTE — Patient Instructions (Addendum)
Start zyrtec over the counter at bedtime.  Can use benzonatate  for  Cough as needed.  Give it time to resolve.. weeks.  Call if not improving as expected.

## 2015-03-27 NOTE — Assessment & Plan Note (Signed)
Likely due to post infectious change and or allergies. No sign of continued infeciton, pt feels well.  Treat with zyrtec  And benzonatate prn.

## 2015-03-27 NOTE — Progress Notes (Signed)
   Subjective:    Patient ID: Diana [name], female    DOB: [birth year], 81 y.o.   MRN: [id number]  HPI    81 year old female patient seen  02/18/15 for nonproductive cough, ear congestion, nasal congestion. Dx with viral infection. No better on phone call 11/16  Dx with acute sinus infection.. Treated with  Z-pack.   Since then pt reports she continues to have cough. She feels better overall. Hoarse voice, no PND, no clearing throat.  no fever. No SOB., Drank Gatorade. No wheeze. When she exerts her self to talk a lot she has pain in chest pain. Swelling Some sneezing.   Improves with cough drop.  Yesterday she had episode of heart racing, pounding.  Not irregular. Lying in bed when it happened. Woke her from sleep.  No associated anxiety. No SOB. No change in    Drinking water moderately well. Gatorade.     Review of Systems  Constitutional: Negative for fever and fatigue.  HENT: Negative for ear pain.   Eyes: Negative for pain.  Respiratory: Negative for chest tightness and shortness of breath.   Cardiovascular: Negative for chest pain, palpitations and leg swelling.  Gastrointestinal: Negative for abdominal pain.  Genitourinary: Negative for dysuria.       Objective:   Physical Exam  Constitutional: Vital signs are normal. She appears well-developed and well-nourished. She is cooperative.  Non-toxic appearance. She does not appear ill. No distress.  HENT:  Head: Normocephalic.  Right Ear: Hearing, tympanic membrane, external ear and ear canal normal. Tympanic membrane is not erythematous, not retracted and not bulging. No middle ear effusion.  Left Ear: Hearing, tympanic membrane, external ear and ear canal normal. Tympanic membrane is not erythematous, not retracted and not bulging.  No middle ear effusion.  Nose: No mucosal edema or rhinorrhea. Right sinus exhibits no maxillary sinus tenderness and no frontal sinus tenderness. Left sinus exhibits no maxillary  sinus tenderness and no frontal sinus tenderness.  Mouth/Throat: Uvula is midline, oropharynx is clear and moist and mucous membranes are normal. No oropharyngeal exudate, posterior oropharyngeal edema, posterior oropharyngeal erythema or tonsillar abscesses.  Eyes: Conjunctivae, EOM and lids are normal. Pupils are equal, round, and reactive to light. Lids are everted and swept, no foreign bodies found.  Neck: Trachea normal and normal range of motion. Neck supple. Carotid bruit is not present. No thyroid mass and no thyromegaly present.  Cardiovascular: Normal rate, regular rhythm, S1 normal, S2 normal, intact distal pulses and normal pulses.  Exam reveals distant heart sounds. Exam reveals no gallop and no friction rub.   Murmur heard.  Systolic murmur is present  Pulmonary/Chest: Effort normal and breath sounds normal. No tachypnea. No respiratory distress. She has no decreased breath sounds. She has no wheezes. She has no rhonchi. She has no rales.  Abdominal: Soft. Normal appearance and bowel sounds are normal. There is no tenderness.  Neurological: She is alert.  Skin: Skin is warm, dry and intact. No rash noted.  Psychiatric: Her speech is normal and behavior is normal. Judgment and thought content normal. Her mood appears not anxious. Cognition and memory are normal. She does not exhibit a depressed mood.          Assessment & Plan:

## 2015-04-16 ENCOUNTER — Telehealth: Payer: Self-pay | Admitting: Family Medicine

## 2015-04-16 NOTE — Telephone Encounter (Signed)
Please call and schedule Medicare Wellness with fasting labs prior for Dr. Bedsole. 

## 2015-04-17 NOTE — Telephone Encounter (Signed)
Called pt, scheduled first available afternoon at pt's request 07/15/15 Medicare Wellness visit / lt

## 2015-05-07 ENCOUNTER — Other Ambulatory Visit: Payer: Self-pay | Admitting: Family Medicine

## 2015-05-07 NOTE — Telephone Encounter (Signed)
Last office visit 03/27/2015.  Not on current medication list.  Refill?

## 2015-06-02 ENCOUNTER — Telehealth: Payer: Self-pay

## 2015-06-02 ENCOUNTER — Telehealth: Payer: Self-pay | Admitting: Family Medicine

## 2015-06-02 NOTE — Telephone Encounter (Signed)
PLEASE NOTE: All timestamps contained within this report are represented as [place] Standard Time. CONFIDENTIALTY NOTICE: This fax transmission is intended only for the addressee. It contains information that is legally privileged, confidential or otherwise protected from use or disclosure. If you are not the intended recipient, you are strictly prohibited from reviewing, disclosing, copying using or disseminating any of this information or taking any action in reliance on or regarding this information. If you have received this fax in error, please notify us immediately by telephone so that we can arrange for its return to [place]. Phone: [phone], Toll-Free: [phone], Fax: [phone] Page: 1 of 2 Call Id: [id number] [place] Patient Name: Diana [name] Gender: Female DOB: [birth year] Age: 82 Y 35 M 26 D Return Phone Number: [phone] (Primary), [phone] (Secondary) Address: City/State/Zip: [place] - Client Client Site [place] - Day Physician Diona Browner, Amy Contact Type Call Who Is Calling Patient / Member / Family / Caregiver Call Type Triage / Clinical Relationship To Patient Self Return Phone Number [phone] (Primary) Chief Complaint Blood Sugar Low Reason for Call Symptomatic / Request for Health Information Initial Comment Caller states c/o weakness and feeling like she was going to faint yesterday, blood sugar was 97 - not currently having any symptoms Appointment Disposition EMR Appointment Scheduled Info pasted into Epic Yes PreDisposition Go to L & D Translation No Nurse Assessment Nurse: Wisdom, RN, Susie Date/Time (Eastern Time): 06/02/2015 11:16:17 AM Confirm and document reason for call. If symptomatic, describe symptoms. You must click the next button to save text entered. ---Caller states she had  weakness and feeling like she was going to faint yesterday; Feels better today; Has the patient traveled out of the country within the last 30 days? ---Not Applicable Does the patient have any new or worsening symptoms? ---No Guidelines Guideline Title Affirmed Question Affirmed Notes Nurse Date/Time (Eastern Time) Dizziness - Lightheadedness [1] MODERATE dizziness (e.g., interferes with normal activities) AND [2] has NOT been evaluated by physician for this (Exception: dizziness caused by heat exposure, sudden standing, or poor fluid intake) Wisdom, RN, Susie 06/02/2015 11:17:45 AM Disp. Time Eilene Ghazi Time) Disposition Final User 06/02/2015 10:43:10 AM Send To Clinical Follow Up Rich Brave, Amy 06/02/2015 11:00:07 AM Attempt made - message left Wisdom, RN, Susie 06/02/2015 11:28:03 AM See Physician within 24 Hours Yes Wisdom, RN, Susie PLEASE NOTE: All timestamps contained within this report are represented as [place] Standard Time. CONFIDENTIALTY NOTICE: This fax transmission is intended only for the addressee. It contains information that is legally privileged, confidential or otherwise protected from use or disclosure. If you are not the intended recipient, you are strictly prohibited from reviewing, disclosing, copying using or disseminating any of this information or taking any action in reliance on or regarding this information. If you have received this fax in error, please notify us immediately by telephone so that we can arrange for its return to [place]. Phone: [phone], Toll-Free: [phone], Fax: [phone] Page: 2 of 2 Call Id: [id number] Caller Understands: Yes Disagree/Comply: Comply Care Advice Given Per Guideline CALL BACK IF: * Passes out (faints) * You become worse. CARE ADVICE given per Dizziness (Adult) guideline. Comments User: Konrad Penta, RN Date/Time Eilene Ghazi Time): 06/02/2015 10:58:48 AM Call primary number - busy signal, called x2 Call secondary number  - was her son and he will contact her to let her know we are trying to  call her; Referrals REFERRED TO PCP OFFICE

## 2015-06-02 NOTE — Telephone Encounter (Signed)
Pt has appt 06/03/15 at 1:15 to see Dr Diona Browner.

## 2015-06-02 NOTE — Telephone Encounter (Signed)
[place]  Patient Name: TANVEER BERGSTRAND  DOB: October 06, 1923    Initial Comment Caller states c/o weakness and feeling like she was going to faint yesterday, blood sugar was 97 - not currently having any symptoms   Nurse Assessment  Nurse: Wisdom, RN, Susie Date/Time (Eastern Time): 06/02/2015 11:16:17 AM  Confirm and document reason for call. If symptomatic, describe symptoms. You must click the next button to save text entered. ---Caller states she had weakness and feeling like she was going to faint yesterday; Feels better today;  Has the patient traveled out of the country within the last 30 days? ---Not Applicable  Does the patient have any new or worsening symptoms? ---No     Guidelines    Guideline Title Affirmed Question Affirmed Notes  Dizziness - Lightheadedness [1] MODERATE dizziness (e.g., interferes with normal activities) AND [2] has NOT been evaluated by physician for this (Exception: dizziness caused by heat exposure, sudden standing, or poor fluid intake)    Final Disposition User   See Physician within 24 Hours Wisdom, RN, Susie    Comments  Call primary number - busy signal, called x2 Call secondary number - was her son and he will contact her to let her know we are trying to call her;   Referrals  REFERRED TO PCP OFFICE   Disagree/Comply: Comply

## 2015-06-03 ENCOUNTER — Encounter: Payer: Self-pay | Admitting: Family Medicine

## 2015-06-03 ENCOUNTER — Ambulatory Visit (INDEPENDENT_AMBULATORY_CARE_PROVIDER_SITE_OTHER): Payer: Medicare Other | Admitting: Family Medicine

## 2015-06-03 VITALS — BP 149/87 | HR 74 | Temp 98.3°F | Ht 62.0 in | Wt 176.8 lb

## 2015-06-03 DIAGNOSIS — R011 Cardiac murmur, unspecified: Secondary | ICD-10-CM

## 2015-06-03 DIAGNOSIS — R55 Syncope and collapse: Secondary | ICD-10-CM

## 2015-06-03 DIAGNOSIS — R42 Dizziness and giddiness: Secondary | ICD-10-CM | POA: Diagnosis not present

## 2015-06-03 DIAGNOSIS — R7303 Prediabetes: Secondary | ICD-10-CM

## 2015-06-03 DIAGNOSIS — R531 Weakness: Secondary | ICD-10-CM | POA: Diagnosis not present

## 2015-06-03 DIAGNOSIS — I359 Nonrheumatic aortic valve disorder, unspecified: Secondary | ICD-10-CM | POA: Insufficient documentation

## 2015-06-03 LAB — COMPREHENSIVE METABOLIC PANEL
ALT: 17 U/L (ref 0–35)
AST: 19 U/L (ref 0–37)
Albumin: 4.3 g/dL (ref 3.5–5.2)
Alkaline Phosphatase: 80 U/L (ref 39–117)
BUN: 15 mg/dL (ref 6–23)
CHLORIDE: 98 meq/L (ref 96–112)
CO2: 30 meq/L (ref 19–32)
CREATININE: 0.92 mg/dL (ref 0.40–1.20)
Calcium: 9.6 mg/dL (ref 8.4–10.5)
GFR: 61.02 mL/min (ref >60.00)
Glucose, Bld: 116 mg/dL — ABNORMAL HIGH (ref 70–99)
Potassium: 4 mEq/L (ref 3.5–5.1)
SODIUM: 135 meq/L (ref 135–145)
Total Bilirubin: 0.2 mg/dL (ref 0.2–1.2)
Total Protein: 8 g/dL (ref 6.0–8.3)

## 2015-06-03 LAB — CBC WITH DIFFERENTIAL/PLATELET
BASOS PCT: 0.5 % (ref 0.0–3.0)
Basophils Absolute: 0.1 10*3/uL (ref 0.0–0.1)
EOS ABS: 0.1 10*3/uL (ref 0.0–0.7)
Eosinophils Relative: 1 % (ref 0.0–5.0)
HCT: 39.8 % (ref 36.0–46.0)
Hemoglobin: 13.4 g/dL (ref 12.0–15.0)
LYMPHS ABS: 2.7 10*3/uL (ref 0.7–4.0)
Lymphocytes Relative: 25.2 % (ref 12.0–46.0)
MCHC: 33.8 g/dL (ref 30.0–36.0)
MCV: 93.4 fl (ref 78.0–100.0)
MONO ABS: 0.8 10*3/uL (ref 0.1–1.0)
Monocytes Relative: 7.2 % (ref 3.0–12.0)
NEUTROS ABS: 7.2 10*3/uL (ref 1.4–7.7)
Neutrophils Relative %: 66.1 % (ref 43.0–77.0)
PLATELETS: 231 10*3/uL (ref 150.0–400.0)
RBC: 4.26 Mil/uL (ref 3.87–5.11)
RDW: 14.1 % (ref 11.5–15.5)
WBC: 10.9 10*3/uL — ABNORMAL HIGH (ref 4.0–10.5)

## 2015-06-03 LAB — TSH: TSH: 2.6 u[IU]/mL (ref 0.35–4.50)

## 2015-06-03 LAB — VITAMIN D 25 HYDROXY (VIT D DEFICIENCY, FRACTURES): VITD: 23.82 ng/mL — AB (ref 30.00–100.00)

## 2015-06-03 LAB — T4, FREE: Free T4: 1.04 ng/dL (ref 0.60–1.60)

## 2015-06-03 LAB — T3, FREE: T3, Free: 2.9 pg/mL (ref 2.3–4.2)

## 2015-06-03 LAB — VITAMIN B12: VITAMIN B 12: 496 pg/mL (ref 211–911)

## 2015-06-03 LAB — HEMOGLOBIN A1C: HEMOGLOBIN A1C: 6.2 % (ref 4.6–6.5)

## 2015-06-03 NOTE — Progress Notes (Signed)
Subjective:    Patient ID: Diana [name], female    DOB: 1923-05-10, 82 y.o.   MRN: [id number]  HPI 82 year old female pt with history of BPPV , hyponatremia, poor balance, HTN , COPD presents following an episode of dizziness yesterday. She reports she was  felt presyncopal, lightheadedness.  Felt weak all over.  Intermittent since then. She had eaten regularly that day. Ate some peanut butter, felt good but then weakness comes back. Felt weak this morning as well. After ate cookie felt better. Decreased grip strength in hands in last week or 2. Sugar at home 90.  No fever, no cough. No bleeding. No head ache, no head injury.  No slurred speech, no confusion.  She has neck pain chronically. Neck cracks a lot. HAs had pain really in left shoulder since she fell on left shoulder. Had te   Wt Readings from Last 3 Encounters:  06/03/15 176 lb 12 oz (80.173 kg)  03/27/15 169 lb (76.658 kg)  02/18/15 179 lb 12 oz (81.534 kg)   BP Readings from Last 3 Encounters:  06/03/15 149/87  03/27/15 132/60  02/18/15 145/62   She has had some dizziness last summer.. Na was found to be low, she has a longstanding history of this. Work up in past neg.  Has murmur.. Told she had this in past.. Not sure why. No ECHO recently.   Review of Systems  Constitutional: Negative for fever and fatigue.  HENT: Negative for ear pain.   Eyes: Negative for pain.  Respiratory: Negative for chest tightness and shortness of breath.   Cardiovascular: Negative for chest pain, palpitations and leg swelling.  Gastrointestinal: Negative for abdominal pain.  Genitourinary: Negative for dysuria.       Objective:   Physical Exam  Constitutional: Vital signs are normal. She appears well-developed and well-nourished. She is cooperative.  Non-toxic appearance. She does not appear ill. No distress.  Elderly female in NAD, using walker, slowed gait  HENT:  Head: Normocephalic.  Right Ear: Hearing, tympanic  membrane, external ear and ear canal normal. Tympanic membrane is not erythematous, not retracted and not bulging.  Left Ear: Hearing, tympanic membrane, external ear and ear canal normal. Tympanic membrane is not erythematous, not retracted and not bulging.  Nose: No mucosal edema or rhinorrhea. Right sinus exhibits no maxillary sinus tenderness and no frontal sinus tenderness. Left sinus exhibits no maxillary sinus tenderness and no frontal sinus tenderness.  Mouth/Throat: Uvula is midline, oropharynx is clear and moist and mucous membranes are normal.  Eyes: Conjunctivae, EOM and lids are normal. Pupils are equal, round, and reactive to light. Lids are everted and swept, no foreign bodies found.  Neck: Trachea normal and normal range of motion. Neck supple. Carotid bruit is not present. No thyroid mass and no thyromegaly present.  Cardiovascular: Normal rate, regular rhythm, S1 normal, S2 normal, intact distal pulses and normal pulses.  Exam reveals no gallop and no friction rub.   Murmur heard.  Systolic murmur is present with a grade of 3/6   No peripheral edema  Pulmonary/Chest: Effort normal and breath sounds normal. No tachypnea. No respiratory distress. She has no decreased breath sounds. She has no wheezes. She has no rhonchi. She has no rales.  Abdominal: Soft. Normal appearance and bowel sounds are normal. There is no tenderness.  Neurological: She is alert. She exhibits abnormal muscle tone.   Decreased strength in bilateral grip 4/5 , nml 5/5 otherwise  Skin: Skin is warm, dry  and intact. No rash noted.     Psychiatric: Her speech is normal and behavior is normal. Judgment and thought content normal. Her mood appears not anxious. Cognition and memory are normal. She does not exhibit a depressed mood.          Assessment & Plan:

## 2015-06-03 NOTE — Assessment & Plan Note (Signed)
Unclear cause. Last episode of dizziness... Sodium was low. Re-eval with labs.

## 2015-06-03 NOTE — Assessment & Plan Note (Signed)
May in par be deconditioning. Will eval with labs. No specific med SE.

## 2015-06-03 NOTE — Assessment & Plan Note (Signed)
Has had in past unclear cause.. Could be new valve issue causing presyncope.

## 2015-06-03 NOTE — Patient Instructions (Addendum)
Stop at front desk to set up ECHO to look at your heart. Stop at lab on way out. Get blood pressure cuff and follow it at home.

## 2015-06-03 NOTE — Progress Notes (Signed)
Pre visit review using our clinic review tool, if applicable. No additional management support is needed unless otherwise documented below in the visit note. 

## 2015-06-05 ENCOUNTER — Telehealth: Payer: Self-pay | Admitting: *Deleted

## 2015-06-05 MED ORDER — VITAMIN D (ERGOCALCIFEROL) 1.25 MG (50000 UNIT) PO CAPS: 50000.0000 [IU] | ORAL_CAPSULE | ORAL | Status: DC

## 2015-06-05 NOTE — Telephone Encounter (Signed)
-----   Message from Jinny Sanders, MD sent at 06/05/2015 11:33 AM EST ----- Labs look good, except very low VIt D .. Send in rx for vit d 50,000 units weekly x 12 weeks. Also wbc slightly elevated.. Any urinary symptoms.. If any we can have her come in to make sure no UTI with UA.

## 2015-06-05 NOTE — Telephone Encounter (Signed)
Diana [name] notified as instructed by telephone.  She states she is not having any urinary symptoms.  Rx for Vit D 50,000 units sent into [facility] as instructed.

## 2015-06-12 ENCOUNTER — Ambulatory Visit (INDEPENDENT_AMBULATORY_CARE_PROVIDER_SITE_OTHER): Payer: Medicare Other

## 2015-06-12 ENCOUNTER — Other Ambulatory Visit: Payer: Self-pay

## 2015-06-12 DIAGNOSIS — R011 Cardiac murmur, unspecified: Secondary | ICD-10-CM | POA: Diagnosis not present

## 2015-06-13 ENCOUNTER — Telehealth: Payer: Self-pay | Admitting: Family Medicine

## 2015-06-13 DIAGNOSIS — R531 Weakness: Secondary | ICD-10-CM

## 2015-06-13 DIAGNOSIS — M542 Cervicalgia: Secondary | ICD-10-CM

## 2015-06-13 DIAGNOSIS — R011 Cardiac murmur, unspecified: Secondary | ICD-10-CM

## 2015-06-13 DIAGNOSIS — R55 Syncope and collapse: Secondary | ICD-10-CM

## 2015-06-13 NOTE — Telephone Encounter (Signed)
Diana [name] notified as instructed by telephone.  She will come in one day next week for neck x-ray.

## 2015-06-13 NOTE — Telephone Encounter (Signed)
Referral for cardiology sent.  As far as neck issue.. We need to start with X-ray of neck.. Have pt come in for neck X-ray.

## 2015-06-13 NOTE — Telephone Encounter (Signed)
-----   Message from Carter Kitten, [place] sent at 06/13/2015  2:52 PM EST ----- Ms. Diana [name] notified as instructed by telephone.  She is agreeable to Cardiology referral.  Would like someone in [place].   She is also asking if she should have a MRI done?   She states since she did have a fall and her neck hurts her as well as having trouble gripping things with her hands.  Please Advise.

## 2015-06-16 ENCOUNTER — Encounter: Payer: Self-pay | Admitting: Emergency Medicine

## 2015-06-16 ENCOUNTER — Emergency Department: Payer: Medicare Other

## 2015-06-16 ENCOUNTER — Emergency Department
Admission: EM | Admit: 2015-06-16 | Discharge: 2015-06-16 | Disposition: A | Discharge status: Home / Self Care | Payer: Medicare Other | Attending: Emergency Medicine | Admitting: Emergency Medicine

## 2015-06-16 ENCOUNTER — Telehealth: Payer: Self-pay | Admitting: Family Medicine

## 2015-06-16 DIAGNOSIS — M542 Cervicalgia: Secondary | ICD-10-CM | POA: Diagnosis present

## 2015-06-16 DIAGNOSIS — M5412 Radiculopathy, cervical region: Secondary | ICD-10-CM | POA: Diagnosis not present

## 2015-06-16 DIAGNOSIS — R209 Unspecified disturbances of skin sensation: Secondary | ICD-10-CM | POA: Diagnosis not present

## 2015-06-16 DIAGNOSIS — Z87891 Personal history of nicotine dependence: Secondary | ICD-10-CM | POA: Diagnosis not present

## 2015-06-16 DIAGNOSIS — I10 Essential (primary) hypertension: Secondary | ICD-10-CM | POA: Diagnosis not present

## 2015-06-16 DIAGNOSIS — Z79899 Other long term (current) drug therapy: Secondary | ICD-10-CM | POA: Diagnosis not present

## 2015-06-16 MED ORDER — MELOXICAM 15 MG PO TABS: 15.0000 mg | ORAL_TABLET | Freq: Every day | ORAL | Status: DC

## 2015-06-16 NOTE — ED Notes (Signed)
Reports difficulty gripping anything with her hands for several days.  States she called Dr. Jack Quarto office and they did not have any apts today so reccommended she come here for an x-ray of her neck, states they are concerned about a pinched nerve from a fall 3 years ago.  Skin w/d, grips weak but equal, no facial droop.

## 2015-06-16 NOTE — Telephone Encounter (Signed)
Agree with ER referral

## 2015-06-16 NOTE — ED Notes (Signed)
See triage note  Pt had a fall several years ago.. But has developed some numbness to both hand couple of weeks ago. But since Friday the grips have weakened per pt and feels like she is not able to feed herself. Also per family her gait has been off for sometime

## 2015-06-16 NOTE — ED Provider Notes (Signed)
[facility] Emergency Department Provider Note  ____________________________________________  Time seen: Approximately 11:37 AM  I have reviewed the triage vital signs and the nursing notes.   HISTORY  Chief Complaint Hand Problem    HPI Diana [name] is a 82 y.o. female who presents emergency department complaining of bilateral hand numbness and tingling. Patient states that she had a fall with a neck injury 3 years prior and has had intermittent neck pain symptoms since incident. She called her primary care and was instructed to present to the emergency department for possible nerve impingement in her cervical region. Patient states that "it feels like pins sticking my hands." She denies any recent injuries. She denies any headache, visual acuity changes, chest pain, shortness of breath, abdominal pain, nausea or vomiting. Patient has not taken any medication for same prior to arrival. She states that current symptoms have been ongoing 2 weeks but states that they have increased over the last 3-4 days.   Past Medical History  Diagnosis Date  . Other abnormal glucose   . Unspecified vitamin D deficiency   . Urinary tract infection, site not specified   . Chronic airway obstruction, not elsewhere classified   . Persistent disorder of initiating or maintaining sleep   . Pain in joint, shoulder region   . Palpitations   . Bacterial pneumonia, unspecified   . Candidiasis of mouth   . Obstructive chronic bronchitis with exacerbation ([place])   . Unspecified viral infection, in conditions classified elsewhere and of unspecified site   . Lumbago   . Unspecified essential hypertension   . Hyposmolality and/or hyponatremia   . Unspecified hypothyroidism   . Cancer , breast   . Encephalitis 1961    Patient Active Problem List   Diagnosis Date Noted  . Systolic murmur [id number]  . Near syncope 06/03/2015  . Weakness generalized 06/03/2015  . Hoarseness of  voice 03/27/2015  . BPV (benign positional vertigo) 01/29/2015  . Dizziness and giddiness 08/27/2014  . Other fatigue 08/27/2014  . Open wound of right elbow 02/22/2014  . Rash and nonspecific skin eruption 01/03/2013  . Poor balance 10/07/2011  . Palpitations 04/28/2011  . Breast cancer, right breast ([place]) 03/29/2011  . Vitamin D deficiency 10/17/2009  . Prediabetes 10/17/2009  . COPD (chronic obstructive pulmonary disease) ([place]) 12/19/2007  . INSOMNIA, CHRONIC 10/25/2007  . BACK PAIN, LUMBAR 03/01/2007  . Hypothyroidism 01/18/2007  . HYPONATREMIA, MILD 01/18/2007  . HYPERTENSION 01/18/2007    Past Surgical History  Procedure Laterality Date  . Abi  04/19/05    Negative  . Venous doppler  04/19/05    LE normal  . Back mri  2005    mild bulging disks  . Mastectomy, radical      Current Outpatient Rx  Name  Route  Sig  Dispense  Refill  . amLODipine (NORVASC) 10 MG tablet      TAKE 1 TABLET BY MOUTH DAILY   30 tablet   2   . benzonatate (TESSALON) 200 MG capsule   Oral   Take 1 capsule (200 mg total) by mouth 2 (two) times daily as needed for cough.   20 capsule   0   . cholecalciferol (VITAMIN D) 1000 UNITS tablet   Oral   Take 1,000 Units by mouth daily.         . furosemide (LASIX) 40 MG tablet      TAKE 1 TABLET BY MOUTH DAILY   30 tablet   2   .  hydrocortisone 2.5 % lotion               . levothyroxine (SYNTHROID, LEVOTHROID) 100 MCG tablet      TAKE 1 TABLET BY MOUTH DAILY   30 tablet   5   . meloxicam (MOBIC) 15 MG tablet   Oral   Take 1 tablet (15 mg total) by mouth daily.   30 tablet   0   . metoprolol succinate (TOPROL-XL) 50 MG 24 hr tablet      TAKE 1 TABLET BY MOUTH DAILY   30 tablet   2   . valsartan (DIOVAN) 160 MG tablet      TAKE 1 TABLET BY MOUTH DAILY   30 tablet   5   . Vitamin D, Ergocalciferol, (DRISDOL) 50000 units CAPS capsule   Oral   Take 1 capsule (50,000 Units total) by mouth every 7 (seven) days.   12  capsule   0     Allergies Meperidine hcl  Family History  Problem Relation Age of Onset  . Rheum arthritis Father   . Stroke Mother   . Diabetes Mother   . Healthy Sister   . Healthy Sister   . Liver cancer      Aunt  . Stomach cancer      Aunt    Social History Social History  Substance Use Topics  . Smoking status: Former Smoker -- 0.50 packs/day for 42 years    Types: Cigarettes  . Smokeless tobacco: Never Used  . Alcohol Use: No     Review of Systems  Constitutional: No fever/chills Eyes: No visual changes.  Cardiovascular: no chest pain. Respiratory: no cough. No SOB. Gastrointestinal: No abdominal pain.  No nausea, no vomiting.  No diarrhea.  No constipation. Genitourinary: Negative for dysuria. No hematuria Musculoskeletal: Negative for back pain. Positive for bilateral upper extremity numbness and tingling. Skin: Negative for rash. Neurological: Negative for headaches, focal weakness or numbness. 10-point ROS otherwise negative.  ____________________________________________   PHYSICAL EXAM:  VITAL SIGNS: ED Triage Vitals  Enc Vitals Group     BP 06/16/15 1056 145/73 mmHg     Pulse Rate 06/16/15 1056 79     Resp 06/16/15 1056 20     Temp 06/16/15 1056 97 F (36.1 C)     Temp Source 06/16/15 1056 Oral     SpO2 06/16/15 1056 97 %     Weight 06/16/15 1056 176 lb (79.833 kg)     Height 06/16/15 1056 5\' 4"  (1.626 m)     Head Cir --      Peak Flow --      Pain Score 06/16/15 0950 1     Pain Loc --      Pain Edu? --      Excl. in [place]? --      Constitutional: Alert and oriented. Well appearing and in no acute distress. Eyes: Conjunctivae are normal. PERRL. EOMI. Head: Atraumatic. Neck: No stridor.  No cervical spine tenderness to palpation. No visible abnormality to the cervical region upon inspection. Cardiovascular: Normal rate, regular rhythm. Normal S1 and S2.  Good peripheral circulation. Respiratory: Normal respiratory effort without  tachypnea or retractions. Lungs CTAB. Musculoskeletal: No lower extremity tenderness nor edema.  No joint effusions. No visible abnormality to bilateral upper extremities. Full range of motion to bilateral upper extremities. Equal grip strength bilaterally. Radial pulse intact. Sensation intact 5 digits. Neurologic:  Normal speech and language. No gross focal neurologic deficits are appreciated.  Skin:  Skin is warm, dry and intact. No rash noted. Psychiatric: Mood and affect are normal. Speech and behavior are normal. Patient exhibits appropriate insight and judgement.   ____________________________________________   LABS (all labs ordered are listed, but only abnormal results are displayed)  Labs Reviewed - No data to display ____________________________________________  EKG   ____________________________________________  RADIOLOGY Diamantina Providence Cuthriell, personally viewed and evaluated these images (plain radiographs) as part of my medical decision making, as well as reviewing the written report by the radiologist.  Dg Cervical Spine 2-3 Views  06/16/2015  CLINICAL DATA:  Numbness in upper extremities.  No recent trauma EXAM: CERVICAL SPINE - 2-3 VIEW COMPARISON:  None. FINDINGS: Frontal, lateral, and open-mouth odontoid images obtained. There is no demonstrable fracture or spondylolisthesis. Prevertebral soft tissues and predental space regions are normal. There is marked disc space narrowing at C6-7 with moderately severe disc space narrowing at C5-6. There are anterior osteophytes at C4, C5, C6, and C7. No erosive change. Bones are osteoporotic. There is extensive calcification in each carotid artery. IMPRESSION: Multilevel arthropathy. No fracture or spondylolisthesis. Bones osteoporotic. Carotid artery calcification bilaterally. Electronically Signed   By: Lowella Grip III M.D.   On: 06/16/2015 11:30     ____________________________________________    PROCEDURES  Procedure(s) performed:       Medications - No data to display   ____________________________________________   INITIAL IMPRESSION / ASSESSMENT AND PLAN / ED COURSE  Pertinent labs & imaging results that were available during my care of the patient were reviewed by me and considered in my medical decision making (see chart for details).  Patient's diagnosis is consistent with cervical radiculopathy. X-ray reveals considerable arthritic changes.. Patient will be discharged home with prescriptions for prescription for meloxicam for symptom control, patient states she is able to take NSAIDs and has been on meloxicam previously for orthopedic conditions. No history of GI or renal issues.. Patient is to follow up with orthopedic surgeon if symptoms persist past this treatment course. Patient is given ED precautions to return to the ED for any worsening or new symptoms.     ____________________________________________  FINAL CLINICAL IMPRESSION(S) / ED DIAGNOSES  Final diagnoses:  Cervical radiculopathy      NEW MEDICATIONS STARTED DURING THIS VISIT:  New Prescriptions   MELOXICAM (MOBIC) 15 MG TABLET    Take 1 tablet (15 mg total) by mouth daily.        Charline Bills Cuthriell, PA-C 06/16/15 1205  Carrie Mew, MD 06/16/15 [phone]

## 2015-06-16 NOTE — Telephone Encounter (Signed)
[place]  Patient Name: Diana [name]  DOB: [birth year]    Initial Comment Caller states, mother was asked to come by this week for an X ray of her neck, she has been getting worse each day since Friday, mobility and hands are very limited. Hands are numb. She wants to come this morning.    Nurse Assessment  Nurse: Wayne Sever, RN, Tillie Rung Date/Time (Eastern Time): 06/16/2015 9:01:41 AM  Confirm and document reason for call. If symptomatic, describe symptoms. You must click the next button to save text entered. ---Caller states I am getting ready to bring her over there for an x-ray. She is having trouble with balancing and walking. She was scheduled for an EKG last Thursday and they are working on scheduling her with a cardiologist. She is getting worse since Friday. Her mobility has changed and she is having trouble using her hands. She is having more trouble getting around. Per son her MD was wanting her to get an x-ray of a neck and caller states he wants to know what to do. He is walking in for x-ray and wants the doctor to see her.  Has the patient traveled out of the country within the last 30 days? ---Not Applicable  Does the patient have any new or worsening symptoms? ---Yes  Will a triage be completed? ---Yes  Related visit to physician within the last 2 weeks? ---Yes  Does the PT have any chronic conditions? (i.e. diabetes, asthma, etc.) ---Yes  List chronic conditions. ---HTN, Vitamin D Deficiency  Is this a behavioral health or substance abuse call? ---No     Guidelines    Guideline Title Affirmed Question Affirmed Notes  Neurologic Deficit Patient sounds very sick or weak to the triager    Final Disposition User   Go to ED Now (or PCP triage) Wayne Sever, RN, Tillie Rung    Comments  No appointments left for the day with any MD's. Son will take to ED   Referrals  [facility] - ED   Disagree/Comply: Comply

## 2015-06-16 NOTE — Discharge Instructions (Signed)

## 2015-06-23 ENCOUNTER — Telehealth: Payer: Self-pay | Admitting: Family Medicine

## 2015-06-23 NOTE — Telephone Encounter (Signed)
Cc: Dr. Jacinto Reap

## 2015-06-23 NOTE — Telephone Encounter (Signed)
We cannot admit someone over the phone.   I would strongly recommend f/u with Dr. Diona Browner tomorrow morning - she has appointments available to talk about long-term planning for care in this 82 year old.

## 2015-06-23 NOTE — Telephone Encounter (Signed)
[facility] called -  Pt went to ed last Monday for hand numbness, better till Friday Son states she cant walk, hands and legs numb and he cant take care of her at home.  He is wanting to know about direct admitting to hospital, or go to nursing home,  He did not want to go to the ed without admission.  Son is requesting cb - [phone]

## 2015-06-23 NOTE — Telephone Encounter (Signed)
Spoke with [name] (son) and advised we are unable to direct admit patients from the office.  Recommend they come in tomorrow to to see Dr. Diona Browner to come up with a plan of care.  Appointment scheduled for 06/24/2015 at 9:15 am with Dr. Diona Browner per Dr. Lillie Fragmin recommendations.

## 2015-06-23 NOTE — Telephone Encounter (Signed)
Patient Name: Diana [name]  DOB: [birth year]    Initial Comment Caller states mother has been having hand pain, hospital said osteoarthritis causing it, rx extra strength ibuprofen. Worse again this weekend.   Nurse Assessment  Nurse: Raphael Gibney, RN, Vanita Ingles Date/Time (Eastern Time): 06/23/2015 8:50:48 AM  Confirm and document reason for call. If symptomatic, describe symptoms. You must click the next button to save text entered. ---Caller states he called last Monday because her hands were numb. Was advised to go to the ER. ER said her osteoarthritis was causing the pain. She was prescribed increased dosage of ibuprofen. She can barely walk and she is having increased pain in her hands. She lives with her son. he wants to talk to the doctor as she can barely walk. She needs help ambulating and with her ADLs. Her muscle skills has decreased. Saturday numbness came back and increased. Numbness in legs and hands.  Has the patient traveled out of the country within the last 30 days? ---Not Applicable  Does the patient have any new or worsening symptoms? ---Yes  Will a triage be completed? ---Yes  Related visit to physician within the last 2 weeks? ---Yes  Does the PT have any chronic conditions? (i.e. diabetes, asthma, etc.) ---Yes  List chronic conditions. ---osteoarthritis  Is this a behavioral health or substance abuse call? ---No     Guidelines    Guideline Title Affirmed Question Affirmed Notes  Neurologic Deficit [1] Numbness (i.e., loss of sensation) of the face, arm / hand, or leg / foot on one side of the body AND [2] gradual onset (e.g., days to weeks) AND [3] present now    Final Disposition User   Go to ED Now (or PCP triage) Raphael Gibney, RN, Vera    Comments  Caller states he can not take care of his mother at home. He needs Dr. Diona Browner to call him regarding regarding as to whether he needs to take her to the ER or direct admit her to the hospital or go the nursing home. He can not get her  in the car and she would need to come to the office by ambulance.  Triage outcome upgraded to go to ER now or PCP triage as pt is having trouble ambulating and her muscle skills have decreased.  Called back line and spoke to [facility] and gave report that son states he can not take care of his mother at home. He needs Dr. Diona Browner to call him regarding regarding as to whether he needs to take her to the ER or direct admit her to the hospital or go the nursing home. He can not get her in the car and she would need to come to the office by ambulance.   Referrals  GO TO FACILITY REFUSED   Disagree/Comply: Disagree

## 2015-06-23 NOTE — Telephone Encounter (Signed)
Already addressed - ADL assessment / possible SnF placement assessment by my partner is set up for tomorrow AM.

## 2015-06-23 NOTE — Telephone Encounter (Signed)
See 06/23/15 phone note from Dr Lorelei Pont; CC to Dr Diona Browner.

## 2015-06-24 ENCOUNTER — Ambulatory Visit (INDEPENDENT_AMBULATORY_CARE_PROVIDER_SITE_OTHER): Payer: Medicare Other | Admitting: Family Medicine

## 2015-06-24 ENCOUNTER — Encounter: Payer: Self-pay | Admitting: Family Medicine

## 2015-06-24 ENCOUNTER — Telehealth: Payer: Self-pay | Admitting: Family Medicine

## 2015-06-24 ENCOUNTER — Ambulatory Visit
Admission: RE | Admit: 2015-06-24 | Discharge: 2015-06-24 | Disposition: A | Discharge status: Home / Self Care | Payer: Medicare Other | Source: Ambulatory Visit | Attending: Family Medicine | Admitting: Family Medicine

## 2015-06-24 VITALS — BP 141/85 | HR 60 | Temp 98.3°F | Ht 62.0 in

## 2015-06-24 DIAGNOSIS — R200 Anesthesia of skin: Secondary | ICD-10-CM

## 2015-06-24 DIAGNOSIS — R3915 Urgency of urination: Secondary | ICD-10-CM | POA: Diagnosis not present

## 2015-06-24 DIAGNOSIS — G8929 Other chronic pain: Secondary | ICD-10-CM

## 2015-06-24 DIAGNOSIS — R29898 Other symptoms and signs involving the musculoskeletal system: Secondary | ICD-10-CM | POA: Diagnosis not present

## 2015-06-24 DIAGNOSIS — R531 Weakness: Secondary | ICD-10-CM

## 2015-06-24 DIAGNOSIS — R208 Other disturbances of skin sensation: Secondary | ICD-10-CM

## 2015-06-24 DIAGNOSIS — M5022 Other cervical disc displacement, mid-cervical region, unspecified level: Secondary | ICD-10-CM | POA: Insufficient documentation

## 2015-06-24 DIAGNOSIS — G542 Cervical root disorders, not elsewhere classified: Secondary | ICD-10-CM | POA: Insufficient documentation

## 2015-06-24 DIAGNOSIS — M4802 Spinal stenosis, cervical region: Secondary | ICD-10-CM | POA: Diagnosis not present

## 2015-06-24 DIAGNOSIS — Z7689 Persons encountering health services in other specified circumstances: Secondary | ICD-10-CM

## 2015-06-24 DIAGNOSIS — M542 Cervicalgia: Secondary | ICD-10-CM

## 2015-06-24 DIAGNOSIS — G9520 Unspecified cord compression: Secondary | ICD-10-CM

## 2015-06-24 MED ORDER — ALPRAZOLAM 0.25 MG PO TABS: ORAL_TABLET | ORAL | Status: DC

## 2015-06-24 NOTE — Progress Notes (Signed)
Subjective:    Patient ID: Diana [name], female    DOB: 05-03-1923, 82 y.o.   MRN: [id number]  HPI  82 year old female pt with history of chronic  lumbar back pain, chronic neck pain COPD, HTN, hypothyroidism, and generalized weakness is here for ED follow up and she has had progressive of her weakness in her upper and lower extremities.  She was seen on 2/14 for dizziness and weakness.  CMET nml, cbc nml, vit D low (repleted), TSH and B12 nml.  She was seen in [facility] ED on2/27/2017 for new onset tingling in bilateral hands.  No recent injuries. Fell and neck injury ? 3 years ago per pt.  X-ray cervical spine:  Multilevel arthropathy. No fracture or spondylolisthesis. Bones osteoporotic. Carotid artery calcification bilaterally.  Felt symptoms to be secondary to cervical radiculopathy.  Given meloxicam.  Today she comes to clinic today with her son. She reports that  She has had progressively worse weakness in last 4 days.  Hands tingling, legs very weak, hands and arm weak.  Cannot stand on her own, cannot dress herself, dropping things, Hands numb. Cannot bathe,cannot use bathroom herself. No perineal numbness or incontinence.   Mild neck pain.  legs are always painful. Meloxicam has not helped much.  Previously was at house by herself, son at house at night after work.   Prior to 2 weeks ago she was able to do ADLs for herself.    Review of Systems  Constitutional: Positive for fatigue. Negative for fever.  HENT: Negative for ear pain.   Eyes: Negative for pain.  Respiratory: Negative for cough and shortness of breath.   Cardiovascular: Negative for chest pain.  Gastrointestinal: Negative for abdominal pain.  Musculoskeletal: Positive for gait problem and neck pain. Negative for myalgias and joint swelling.  Neurological: Positive for weakness and numbness. Negative for headaches.       Objective:   Physical Exam  Constitutional: Vital signs are normal. She  appears well-developed and well-nourished. She is cooperative.  Non-toxic appearance. She does not appear ill. No distress.  Elderly female in NAD  HENT:  Head: Normocephalic.  Right Ear: Hearing, tympanic membrane, external ear and ear canal normal. Tympanic membrane is not erythematous, not retracted and not bulging.  Left Ear: Hearing, tympanic membrane, external ear and ear canal normal. Tympanic membrane is not erythematous, not retracted and not bulging.  Nose: No mucosal edema or rhinorrhea. Right sinus exhibits no maxillary sinus tenderness and no frontal sinus tenderness. Left sinus exhibits no maxillary sinus tenderness and no frontal sinus tenderness.  Mouth/Throat: Uvula is midline, oropharynx is clear and moist and mucous membranes are normal.  Eyes: Conjunctivae, EOM and lids are normal. Pupils are equal, round, and reactive to light. Lids are everted and swept, no foreign bodies found.  Neck: Trachea normal. Neck supple. Muscular tenderness present. No spinous process tenderness present. Carotid bruit is not present. Decreased range of motion present. No thyroid mass and no thyromegaly present.  ttp over bilateral upper shoulders  Cardiovascular: Normal rate, regular rhythm, S1 normal, S2 normal, normal heart sounds, intact distal pulses and normal pulses.  Exam reveals no gallop and no friction rub.   No murmur heard.  No peripheral edema  Pulmonary/Chest: Effort normal and breath sounds normal. No tachypnea. No respiratory distress. She has no decreased breath sounds. She has no wheezes. She has no rhonchi. She has no rales.  Abdominal: Soft. Normal appearance and bowel sounds are normal. There is  no tenderness.  Neurological: She is alert. She exhibits abnormal muscle tone.   Decreased strength in bilateral grip, arm abduction, hip ext, knee flexion and ext , foot dorsi and plantar flexion 1/5   Skin: Skin is warm, dry and intact. No rash noted.     Psychiatric: Her speech is  normal and behavior is normal. Judgment and thought content normal. Her mood appears not anxious. Cognition and memory are normal. She does not exhibit a depressed mood.          Assessment & Plan:   Dramatic change in upper and lower extremity change in comparison to 2/14 OV.  New numbness in upper extremities. Lab eval negative CMET, cbc, TSH, low vit D, nml B12. X-ray cervical spine: OA No perineal numbness or incontinence. CONCERNING FOR  CERVICAL SPINAL STENOSIS OR COMPRESSION. STAT MRI asap.  Pt unable to care for herself at home. Son able to care for mother with dressing and bathroom issues.  Has aide during the day, but not at night.  Discussed options. Son will look into SNF options.  Pt info given to care guide, Scherry Ran who will call pt and son to help facilitate transition to SNF or other options.  Addendum: MRI cervical spine showed severe spinal cord compression  C3-C4, abnormal cord signal from central disc extrusion. STAT referral for neurosurgery consult. Spoke with son and he is agreeable. Pt is moderate  surgical risk mainly from  Age and COPD, well controlled , NOT O2 dependant, No history of CAD but recent ECHO nml EF showed mild aortic stenosis. She has upcoming Cardiology appt on 3/21.

## 2015-06-24 NOTE — Telephone Encounter (Signed)
FL-2 Form placed in Dr. Rometta Emery in box to complete.

## 2015-06-24 NOTE — Telephone Encounter (Signed)
In outbox

## 2015-06-24 NOTE — Telephone Encounter (Signed)
son dropped off form from [facility] putting in rx tower. Please call [phone] when ready to pick up  Thanks

## 2015-06-24 NOTE — Patient Instructions (Signed)
Stop at front desk to talk with  Rosaria Ferries about MRI.  Scherry Ran will call you tommorow about helping you through the nursing facility process.

## 2015-06-24 NOTE — Progress Notes (Signed)
Pre visit review using our clinic review tool, if applicable. No additional management support is needed unless otherwise documented below in the visit note. 

## 2015-06-25 ENCOUNTER — Other Ambulatory Visit: Payer: Self-pay | Admitting: Neurological Surgery

## 2015-06-25 ENCOUNTER — Encounter (HOSPITAL_COMMUNITY): Payer: Self-pay | Admitting: *Deleted

## 2015-06-25 DIAGNOSIS — M4712 Other spondylosis with myelopathy, cervical region: Secondary | ICD-10-CM | POA: Diagnosis not present

## 2015-06-25 DIAGNOSIS — I10 Essential (primary) hypertension: Secondary | ICD-10-CM | POA: Diagnosis not present

## 2015-06-25 NOTE — Progress Notes (Signed)
Spoke with pt's son, [name] for pre-op call. Pt is hard of hearing. Bruce states pt does not have a cardiac history and states she never c/o chest pain or sob.  Medical clearance noted in notes from Dr. Diona Browner on 06/24/15.   Echo - 06/12/15 - in EPIC EKG - 03/27/15 - in EPIC

## 2015-06-25 NOTE — Progress Notes (Signed)
Anesthesia Chart Review: SAME DAY WORK-UP. Late add-on.  Patient is a 82 year old female scheduled for C3-4 laminectomy on 06/26/15 by Dr. Cyndy Freeze.  History includes former smoker, palpitations, murmur (mild AS by 06/12/15 echo), HTN, breast cancer s/p right radical mastectomy '86, hypothyroidism, encephalitis '61, RLS, chronic bronchitis.   PCP is Dr. Eliezer Lofts, last visit 06/24/15 for progressive weakness in legs/arms/hands with tingling in bilateral hands following a recent fall. MRI showed severe spinal cord compression C3-4, and patient was referred for STAT neurosurgery evaluation. Per Dr. Diona Browner, "Pt is moderate surgical risk mainly from Age and COPD, well controlled , NOT O2 dependant, No history of CAD but recent ECHO nml EF showed mild aortic stenosis. She has upcoming Cardiology appt on 3/21."  Meds include Xanax, amlodipine, Lasix, levothyroxine, Toprol XL, valsartan.  03/27/15 EKG: NSR.  06/12/15 Echo (ordered by her PCP due to episode of presyncope with finding of murmur on exam): Study Conclusions - Left ventricle: The cavity size was normal. Systolic function was  normal. The estimated ejection fraction was in the range of 60%  to 65%. Wall motion was normal; there were no regional wall  motion abnormalities. Left ventricular diastolic function  parameters were normal. - Aortic valve: Poorly visualized. moderately calcified leaflets.  There was very mild stenosis. There was mild regurgitation. Valve  area (VTI): 0.94 cm^2. - Left atrium: The atrium was mildly dilated. - Right ventricle: Systolic function was normal. - Pulmonary arteries: Systolic pressure was mildly elevated. PA  peak pressure: 40 mm Hg (S).  06/24/15 C-spine MRI: IMPRESSION: Severe cord compression at C3-4 related to central disc extrusion and posterior element hypertrophy. Canal diameter 5 mm. Abnormal cord signal is noted, which could represent a combination of acute and chronic injury. Given the  history of previous fall, the abnormal cord signal could represent sequelae of central cord syndrome. Similar less severe changes at C4-5, C5-6, and C6-7 with varying degrees of spinal stenosis and neural impingement.  She will need updated labs on arrival.   If no acute changes then I would anticipate that she can proceed as planned.  George [facility] Stay [facility]/Anesthesiology Phone [phone] 06/25/2015 3:29 PM

## 2015-06-25 NOTE — Telephone Encounter (Signed)
Bruce notified FL-2 form is ready to be picked up at the front desk.

## 2015-06-26 ENCOUNTER — Encounter (HOSPITAL_COMMUNITY)
Admission: RE | Disposition: A | Discharge status: Skilled Nursing Facility | Payer: Self-pay | Source: Ambulatory Visit | Attending: Neurological Surgery

## 2015-06-26 ENCOUNTER — Inpatient Hospital Stay (HOSPITAL_COMMUNITY)
Admission: RE | Admit: 2015-06-26 | Discharge: 2015-06-27 | DRG: 029 | Disposition: A | Discharge status: Skilled Nursing Facility | Payer: Medicare Other | Source: Ambulatory Visit | Attending: Neurological Surgery | Admitting: Neurological Surgery

## 2015-06-26 ENCOUNTER — Inpatient Hospital Stay (HOSPITAL_COMMUNITY): Payer: Medicare Other | Admitting: Vascular Surgery

## 2015-06-26 ENCOUNTER — Encounter (HOSPITAL_COMMUNITY): Payer: Self-pay | Admitting: Certified Registered Nurse Anesthetist

## 2015-06-26 ENCOUNTER — Inpatient Hospital Stay (HOSPITAL_COMMUNITY): Payer: Medicare Other

## 2015-06-26 DIAGNOSIS — I10 Essential (primary) hypertension: Secondary | ICD-10-CM | POA: Diagnosis present

## 2015-06-26 DIAGNOSIS — Z87891 Personal history of nicotine dependence: Secondary | ICD-10-CM | POA: Diagnosis not present

## 2015-06-26 DIAGNOSIS — M1990 Unspecified osteoarthritis, unspecified site: Secondary | ICD-10-CM | POA: Diagnosis present

## 2015-06-26 DIAGNOSIS — Z6830 Body mass index (BMI) 30.0-30.9, adult: Secondary | ICD-10-CM | POA: Diagnosis not present

## 2015-06-26 DIAGNOSIS — Z9012 Acquired absence of left breast and nipple: Secondary | ICD-10-CM

## 2015-06-26 DIAGNOSIS — M47016 Anterior spinal artery compression syndromes, lumbar region: Secondary | ICD-10-CM | POA: Diagnosis not present

## 2015-06-26 DIAGNOSIS — Z4789 Encounter for other orthopedic aftercare: Secondary | ICD-10-CM | POA: Diagnosis not present

## 2015-06-26 DIAGNOSIS — G9589 Other specified diseases of spinal cord: Secondary | ICD-10-CM | POA: Diagnosis not present

## 2015-06-26 DIAGNOSIS — R2681 Unsteadiness on feet: Secondary | ICD-10-CM | POA: Diagnosis not present

## 2015-06-26 DIAGNOSIS — Z993 Dependence on wheelchair: Secondary | ICD-10-CM | POA: Diagnosis not present

## 2015-06-26 DIAGNOSIS — Z419 Encounter for procedure for purposes other than remedying health state, unspecified: Secondary | ICD-10-CM

## 2015-06-26 DIAGNOSIS — Z9889 Other specified postprocedural states: Secondary | ICD-10-CM | POA: Diagnosis not present

## 2015-06-26 DIAGNOSIS — E039 Hypothyroidism, unspecified: Secondary | ICD-10-CM | POA: Diagnosis present

## 2015-06-26 DIAGNOSIS — M4712 Other spondylosis with myelopathy, cervical region: Secondary | ICD-10-CM | POA: Diagnosis present

## 2015-06-26 DIAGNOSIS — S142XXA Injury of nerve root of cervical spine, initial encounter: Secondary | ICD-10-CM | POA: Diagnosis not present

## 2015-06-26 DIAGNOSIS — M5002 Cervical disc disorder with myelopathy, mid-cervical region, unspecified level: Secondary | ICD-10-CM | POA: Diagnosis not present

## 2015-06-26 DIAGNOSIS — M4802 Spinal stenosis, cervical region: Secondary | ICD-10-CM | POA: Diagnosis present

## 2015-06-26 DIAGNOSIS — G2581 Restless legs syndrome: Secondary | ICD-10-CM | POA: Diagnosis not present

## 2015-06-26 DIAGNOSIS — M6281 Muscle weakness (generalized): Secondary | ICD-10-CM | POA: Diagnosis not present

## 2015-06-26 DIAGNOSIS — M545 Low back pain: Secondary | ICD-10-CM | POA: Diagnosis not present

## 2015-06-26 DIAGNOSIS — M4602 Spinal enthesopathy, cervical region: Secondary | ICD-10-CM | POA: Diagnosis not present

## 2015-06-26 DIAGNOSIS — J449 Chronic obstructive pulmonary disease, unspecified: Secondary | ICD-10-CM | POA: Diagnosis not present

## 2015-06-26 DIAGNOSIS — M549 Dorsalgia, unspecified: Secondary | ICD-10-CM | POA: Diagnosis not present

## 2015-06-26 HISTORY — DX: Restless legs syndrome: G25.81

## 2015-06-26 HISTORY — DX: Unspecified osteoarthritis, unspecified site: M19.90

## 2015-06-26 HISTORY — PX: POSTERIOR CERVICAL LAMINECTOMY: SHX2248

## 2015-06-26 HISTORY — DX: Family history of other specified conditions: Z84.89

## 2015-06-26 HISTORY — DX: Cardiac murmur, unspecified: R01.1

## 2015-06-26 LAB — BASIC METABOLIC PANEL
Anion gap: 10 (ref 5–15)
BUN: 15 mg/dL (ref 6–20)
CALCIUM: 9.5 mg/dL (ref 8.9–10.3)
CO2: 26 mmol/L (ref 22–32)
CREATININE: 0.86 mg/dL (ref 0.44–1.00)
Chloride: 101 mmol/L (ref 101–111)
GFR, EST NON AFRICAN AMERICAN: 58 mL/min — AB (ref >60)
Glucose, Bld: 101 mg/dL — ABNORMAL HIGH (ref 65–99)
Potassium: 4 mmol/L (ref 3.5–5.1)
SODIUM: 137 mmol/L (ref 135–145)

## 2015-06-26 LAB — CBC
HCT: 42.6 % (ref 36.0–46.0)
Hemoglobin: 14 g/dL (ref 12.0–15.0)
MCH: 30.2 pg (ref 26.0–34.0)
MCHC: 32.9 g/dL (ref 30.0–36.0)
MCV: 91.8 fL (ref 78.0–100.0)
PLATELETS: 227 10*3/uL (ref 150–400)
RBC: 4.64 MIL/uL (ref 3.87–5.11)
RDW: 13.4 % (ref 11.5–15.5)
WBC: 9.2 10*3/uL (ref 4.0–10.5)

## 2015-06-26 LAB — SURGICAL PCR SCREEN
MRSA, PCR: NEGATIVE
STAPHYLOCOCCUS AUREUS: NEGATIVE

## 2015-06-26 SURGERY — POSTERIOR CERVICAL LAMINECTOMY
Anesthesia: General | Site: Neck

## 2015-06-26 MED ORDER — HEMOSTATIC AGENTS (NO CHARGE) OPTIME
TOPICAL | Status: DC | PRN
  Administered 2015-06-26: 1 via TOPICAL

## 2015-06-26 MED ORDER — BISACODYL 5 MG PO TBEC: 5.0000 mg | DELAYED_RELEASE_TABLET | Freq: Every day | ORAL | Status: DC | PRN

## 2015-06-26 MED ORDER — DOCUSATE SODIUM 100 MG PO CAPS
100.0000 mg | ORAL_CAPSULE | Freq: Two times a day (BID) | ORAL | Status: DC
  Administered 2015-06-27: 100 mg via ORAL
  Filled 2015-06-26: qty 1

## 2015-06-26 MED ORDER — BUPIVACAINE LIPOSOME 1.3 % IJ SUSP
20.0000 mL | INTRAMUSCULAR | Status: DC
  Filled 2015-06-26: qty 20

## 2015-06-26 MED ORDER — LACTATED RINGERS IV SOLN
INTRAVENOUS | Status: DC
  Administered 2015-06-26 (×3): via INTRAVENOUS

## 2015-06-26 MED ORDER — SUGAMMADEX SODIUM 200 MG/2ML IV SOLN
INTRAVENOUS | Status: AC
  Filled 2015-06-26: qty 2

## 2015-06-26 MED ORDER — FENTANYL CITRATE (PF) 100 MCG/2ML IJ SOLN
INTRAMUSCULAR | Status: AC
  Filled 2015-06-26: qty 2

## 2015-06-26 MED ORDER — MENTHOL 3 MG MT LOZG: 1.0000 | LOZENGE | OROMUCOSAL | Status: DC | PRN

## 2015-06-26 MED ORDER — FUROSEMIDE 40 MG PO TABS
40.0000 mg | ORAL_TABLET | Freq: Every day | ORAL | Status: DC
  Administered 2015-06-27: 40 mg via ORAL
  Filled 2015-06-26: qty 1

## 2015-06-26 MED ORDER — METOCLOPRAMIDE HCL 5 MG/ML IJ SOLN: 10.0000 mg | Freq: Once | INTRAMUSCULAR | Status: DC | PRN

## 2015-06-26 MED ORDER — METOPROLOL SUCCINATE ER 25 MG PO TB24
50.0000 mg | ORAL_TABLET | Freq: Every day | ORAL | Status: DC
  Administered 2015-06-27: 50 mg via ORAL
  Filled 2015-06-26: qty 2

## 2015-06-26 MED ORDER — THROMBIN 5000 UNITS EX SOLR
OROMUCOSAL | Status: DC | PRN
  Administered 2015-06-26: 17:00:00 via TOPICAL

## 2015-06-26 MED ORDER — SODIUM CHLORIDE 0.9% FLUSH
3.0000 mL | Freq: Two times a day (BID) | INTRAVENOUS | Status: DC
  Administered 2015-06-26: 10 mL via INTRAVENOUS

## 2015-06-26 MED ORDER — VITAMIN D3 25 MCG (1000 UNIT) PO TABS
1000.0000 [IU] | ORAL_TABLET | Freq: Every day | ORAL | Status: DC
  Administered 2015-06-27: 1000 [IU] via ORAL
  Filled 2015-06-26 (×3): qty 1

## 2015-06-26 MED ORDER — BUPIVACAINE LIPOSOME 1.3 % IJ SUSP
INTRAMUSCULAR | Status: DC | PRN
  Administered 2015-06-26: 20 mL

## 2015-06-26 MED ORDER — HYDROMORPHONE HCL 1 MG/ML IJ SOLN
0.5000 mg | INTRAMUSCULAR | Status: DC | PRN
  Administered 2015-06-26: 0.5 mg via INTRAVENOUS
  Filled 2015-06-26: qty 1

## 2015-06-26 MED ORDER — SODIUM CHLORIDE 0.9 % IV SOLN: INTRAVENOUS | Status: DC

## 2015-06-26 MED ORDER — ACETAMINOPHEN 650 MG RE SUPP: 650.0000 mg | RECTAL | Status: DC | PRN

## 2015-06-26 MED ORDER — VANCOMYCIN HCL 500 MG IV SOLR
INTRAVENOUS | Status: DC | PRN
  Administered 2015-06-26: 1000 mg via TOPICAL

## 2015-06-26 MED ORDER — SENNA 8.6 MG PO TABS
1.0000 | ORAL_TABLET | Freq: Two times a day (BID) | ORAL | Status: DC
  Administered 2015-06-27: 8.6 mg via ORAL
  Filled 2015-06-26: qty 1

## 2015-06-26 MED ORDER — FENTANYL CITRATE (PF) 100 MCG/2ML IJ SOLN
25.0000 ug | INTRAMUSCULAR | Status: DC | PRN
Start: 2015-06-26 — End: 2015-06-26
  Administered 2015-06-26: 25 ug via INTRAVENOUS

## 2015-06-26 MED ORDER — EPHEDRINE SULFATE 50 MG/ML IJ SOLN
INTRAMUSCULAR | Status: DC | PRN
  Administered 2015-06-26: 5 mg via INTRAVENOUS
  Administered 2015-06-26 (×2): 10 mg via INTRAVENOUS

## 2015-06-26 MED ORDER — VANCOMYCIN HCL 1000 MG IV SOLR
INTRAVENOUS | Status: AC
  Filled 2015-06-26: qty 1000

## 2015-06-26 MED ORDER — AMLODIPINE BESYLATE 10 MG PO TABS
10.0000 mg | ORAL_TABLET | Freq: Every day | ORAL | Status: DC
  Administered 2015-06-27: 10 mg via ORAL
  Filled 2015-06-26: qty 1

## 2015-06-26 MED ORDER — DEXAMETHASONE SODIUM PHOSPHATE 10 MG/ML IJ SOLN
INTRAMUSCULAR | Status: DC | PRN
  Administered 2015-06-26: 10 mg via INTRAVENOUS

## 2015-06-26 MED ORDER — OXYCODONE-ACETAMINOPHEN 5-325 MG PO TABS: 1.0000 | ORAL_TABLET | ORAL | Status: DC | PRN

## 2015-06-26 MED ORDER — THROMBIN 5000 UNITS EX SOLR
CUTANEOUS | Status: DC | PRN
  Administered 2015-06-26 (×2): 5000 [IU] via TOPICAL

## 2015-06-26 MED ORDER — ACETAMINOPHEN 325 MG PO TABS: 650.0000 mg | ORAL_TABLET | ORAL | Status: DC | PRN

## 2015-06-26 MED ORDER — ONDANSETRON HCL 4 MG/2ML IJ SOLN
INTRAMUSCULAR | Status: DC | PRN
  Administered 2015-06-26: 4 mg via INTRAVENOUS

## 2015-06-26 MED ORDER — HYDROCODONE-ACETAMINOPHEN 5-325 MG PO TABS
1.0000 | ORAL_TABLET | ORAL | Status: DC | PRN
  Filled 2015-06-26: qty 1

## 2015-06-26 MED ORDER — CEFAZOLIN SODIUM-DEXTROSE 2-3 GM-% IV SOLR
INTRAVENOUS | Status: DC | PRN
  Administered 2015-06-26: 2 g via INTRAVENOUS

## 2015-06-26 MED ORDER — FENTANYL CITRATE (PF) 100 MCG/2ML IJ SOLN
INTRAMUSCULAR | Status: DC | PRN
  Administered 2015-06-26: 100 ug via INTRAVENOUS
  Administered 2015-06-26: 50 ug via INTRAVENOUS

## 2015-06-26 MED ORDER — PHENOL 1.4 % MT LIQD
1.0000 | OROMUCOSAL | Status: DC | PRN
Start: 2015-06-26 — End: 2015-06-27

## 2015-06-26 MED ORDER — SODIUM CHLORIDE 0.9% FLUSH: 3.0000 mL | INTRAVENOUS | Status: DC | PRN

## 2015-06-26 MED ORDER — 0.9 % SODIUM CHLORIDE (POUR BTL) OPTIME
TOPICAL | Status: DC | PRN
  Administered 2015-06-26: 1000 mL

## 2015-06-26 MED ORDER — BACITRACIN ZINC 500 UNIT/GM EX OINT
TOPICAL_OINTMENT | CUTANEOUS | Status: DC | PRN
  Administered 2015-06-26: 1 via TOPICAL

## 2015-06-26 MED ORDER — PANTOPRAZOLE SODIUM 40 MG IV SOLR
40.0000 mg | Freq: Every day | INTRAVENOUS | Status: DC
  Administered 2015-06-26: 40 mg via INTRAVENOUS
  Filled 2015-06-26: qty 40

## 2015-06-26 MED ORDER — LEVOTHYROXINE SODIUM 100 MCG PO TABS
100.0000 ug | ORAL_TABLET | Freq: Every day | ORAL | Status: DC
  Administered 2015-06-27: 100 ug via ORAL
  Filled 2015-06-26: qty 1

## 2015-06-26 MED ORDER — ZOLPIDEM TARTRATE 5 MG PO TABS: 5.0000 mg | ORAL_TABLET | Freq: Every evening | ORAL | Status: DC | PRN

## 2015-06-26 MED ORDER — FENTANYL CITRATE (PF) 250 MCG/5ML IJ SOLN
INTRAMUSCULAR | Status: AC
  Filled 2015-06-26: qty 5

## 2015-06-26 MED ORDER — ROCURONIUM BROMIDE 100 MG/10ML IV SOLN
INTRAVENOUS | Status: DC | PRN
  Administered 2015-06-26: 20 mg via INTRAVENOUS
  Administered 2015-06-26: 40 mg via INTRAVENOUS

## 2015-06-26 MED ORDER — CEFAZOLIN SODIUM 1-5 GM-% IV SOLN
1.0000 g | Freq: Three times a day (TID) | INTRAVENOUS | Status: AC
  Administered 2015-06-26 – 2015-06-27 (×2): 1 g via INTRAVENOUS
  Filled 2015-06-26 (×2): qty 50

## 2015-06-26 MED ORDER — HYDROCORTISONE 2.5 % EX LOTN: 1.0000 "application " | TOPICAL_LOTION | Freq: Every day | CUTANEOUS | Status: DC | PRN

## 2015-06-26 MED ORDER — LIDOCAINE HCL (CARDIAC) 20 MG/ML IV SOLN
INTRAVENOUS | Status: DC | PRN
  Administered 2015-06-26: 50 mg via INTRAVENOUS

## 2015-06-26 MED ORDER — BACITRACIN 50000 UNITS IM SOLR
INTRAMUSCULAR | Status: DC | PRN
  Administered 2015-06-26: 17:00:00

## 2015-06-26 MED ORDER — SODIUM CHLORIDE 0.9 % IV SOLN: 250.0000 mL | INTRAVENOUS | Status: DC

## 2015-06-26 MED ORDER — SUGAMMADEX SODIUM 200 MG/2ML IV SOLN
INTRAVENOUS | Status: DC | PRN
  Administered 2015-06-26: 200 mg via INTRAVENOUS

## 2015-06-26 MED ORDER — ROCURONIUM BROMIDE 50 MG/5ML IV SOLN
INTRAVENOUS | Status: AC
  Filled 2015-06-26: qty 1

## 2015-06-26 MED ORDER — DEXAMETHASONE SODIUM PHOSPHATE 10 MG/ML IJ SOLN
INTRAMUSCULAR | Status: AC
  Filled 2015-06-26: qty 1

## 2015-06-26 MED ORDER — FLEET ENEMA 7-19 GM/118ML RE ENEM: 1.0000 | ENEMA | Freq: Once | RECTAL | Status: DC | PRN

## 2015-06-26 MED ORDER — MUPIROCIN 2 % EX OINT
1.0000 "application " | TOPICAL_OINTMENT | Freq: Once | CUTANEOUS | Status: AC
  Administered 2015-06-26: 1 via TOPICAL
  Filled 2015-06-26: qty 22

## 2015-06-26 MED ORDER — IRBESARTAN 300 MG PO TABS
150.0000 mg | ORAL_TABLET | Freq: Every day | ORAL | Status: DC
  Administered 2015-06-27: 150 mg via ORAL
  Filled 2015-06-26: qty 1

## 2015-06-26 MED ORDER — METHOCARBAMOL 500 MG PO TABS: 500.0000 mg | ORAL_TABLET | Freq: Four times a day (QID) | ORAL | Status: DC | PRN

## 2015-06-26 MED ORDER — PROPOFOL 10 MG/ML IV BOLUS
INTRAVENOUS | Status: DC | PRN
  Administered 2015-06-26: 80 mg via INTRAVENOUS

## 2015-06-26 MED ORDER — ALBUTEROL SULFATE HFA 108 (90 BASE) MCG/ACT IN AERS
INHALATION_SPRAY | RESPIRATORY_TRACT | Status: AC
  Filled 2015-06-26: qty 6.7

## 2015-06-26 MED ORDER — METHOCARBAMOL 1000 MG/10ML IJ SOLN
500.0000 mg | Freq: Four times a day (QID) | INTRAVENOUS | Status: DC | PRN
  Filled 2015-06-26: qty 5

## 2015-06-26 MED ORDER — LIDOCAINE-EPINEPHRINE 1 %-1:100000 IJ SOLN
INTRAMUSCULAR | Status: DC | PRN
  Administered 2015-06-26: 15 mL

## 2015-06-26 MED ORDER — ONDANSETRON HCL 4 MG/2ML IJ SOLN
INTRAMUSCULAR | Status: AC
  Filled 2015-06-26: qty 2

## 2015-06-26 MED ORDER — ONDANSETRON HCL 4 MG/2ML IJ SOLN: 4.0000 mg | INTRAMUSCULAR | Status: DC | PRN

## 2015-06-26 SURGICAL SUPPLY — 82 items
ADH SKN CLS APL DERMABOND .7 (GAUZE/BANDAGES/DRESSINGS) ×1
APL SKNCLS STERI-STRIP NONHPOA (GAUZE/BANDAGES/DRESSINGS)
APPLICATOR CHLORAPREP 3ML ORNG (MISCELLANEOUS) ×3 IMPLANT
BENZOIN TINCTURE PRP APPL 2/3 (GAUZE/BANDAGES/DRESSINGS) IMPLANT
BLADE CLIPPER SURG (BLADE) ×2 IMPLANT
BLADE ULTRA TIP 2M (BLADE) IMPLANT
BUR MATCHSTICK NEURO 3.0 LAGG (BURR) ×2 IMPLANT
BUR PRECISION FLUTE 5.0 (BURR) IMPLANT
CANISTER SUCT 3000ML PPV (MISCELLANEOUS) ×3 IMPLANT
COVER BACK TABLE 60X90IN (DRAPES) ×3 IMPLANT
DECANTER SPIKE VIAL GLASS SM (MISCELLANEOUS) ×3 IMPLANT
DERMABOND ADVANCED (GAUZE/BANDAGES/DRESSINGS) ×2
DERMABOND ADVANCED .7 DNX12 (GAUZE/BANDAGES/DRESSINGS) ×1 IMPLANT
DRAPE MICROSCOPE LEICA (MISCELLANEOUS) IMPLANT
DRAPE POUCH INSTRU U-SHP 10X18 (DRAPES) ×3 IMPLANT
DRSG OPSITE POSTOP 4X6 (GAUZE/BANDAGES/DRESSINGS) ×2 IMPLANT
DRSG PAD ABDOMINAL 8X10 ST (GAUZE/BANDAGES/DRESSINGS) IMPLANT
ELECT REM PT RETURN 9FT ADLT (ELECTROSURGICAL) ×3
ELECTRODE REM PT RTRN 9FT ADLT (ELECTROSURGICAL) ×1 IMPLANT
GAUZE SPONGE 4X4 12PLY STRL (GAUZE/BANDAGES/DRESSINGS) ×1 IMPLANT
GAUZE SPONGE 4X4 16PLY XRAY LF (GAUZE/BANDAGES/DRESSINGS) IMPLANT
GLOVE BIO SURGEON STRL SZ 6.5 (GLOVE) ×2 IMPLANT
GLOVE BIO SURGEON STRL SZ8 (GLOVE) ×2 IMPLANT
GLOVE BIO SURGEONS STRL SZ 6.5 (GLOVE) ×2
GLOVE BIOGEL PI IND STRL 6.5 (GLOVE) IMPLANT
GLOVE BIOGEL PI IND STRL 7.0 (GLOVE) IMPLANT
GLOVE BIOGEL PI IND STRL 7.5 (GLOVE) ×1 IMPLANT
GLOVE BIOGEL PI INDICATOR 6.5 (GLOVE) ×2
GLOVE BIOGEL PI INDICATOR 7.0 (GLOVE) ×2
GLOVE BIOGEL PI INDICATOR 7.5 (GLOVE) ×4
GLOVE EXAM NITRILE LRG STRL (GLOVE) IMPLANT
GLOVE EXAM NITRILE MD LF STRL (GLOVE) IMPLANT
GLOVE EXAM NITRILE XL STR (GLOVE) IMPLANT
GLOVE EXAM NITRILE XS STR PU (GLOVE) IMPLANT
GLOVE SS BIOGEL STRL SZ 7 (GLOVE) ×2 IMPLANT
GLOVE SS N UNI LF 6.5 STRL (GLOVE) ×4 IMPLANT
GLOVE SUPERSENSE BIOGEL SZ 7 (GLOVE) ×4
GOWN STRL REUS W/ TWL LRG LVL3 (GOWN DISPOSABLE) IMPLANT
GOWN STRL REUS W/ TWL XL LVL3 (GOWN DISPOSABLE) IMPLANT
GOWN STRL REUS W/TWL 2XL LVL3 (GOWN DISPOSABLE) IMPLANT
GOWN STRL REUS W/TWL LRG LVL3 (GOWN DISPOSABLE) ×6
GOWN STRL REUS W/TWL XL LVL3 (GOWN DISPOSABLE) ×6
HEMOSTAT POWDER SURGIFOAM 1G (HEMOSTASIS) ×3 IMPLANT
HEMOSTAT SURGICEL 2X14 (HEMOSTASIS) IMPLANT
KIT BASIN OR (CUSTOM PROCEDURE TRAY) ×3 IMPLANT
KIT ROOM TURNOVER OR (KITS) ×3 IMPLANT
MARKER SKIN DUAL TIP RULER LAB (MISCELLANEOUS) ×1 IMPLANT
NDL HYPO 18GX1.5 BLUNT FILL (NEEDLE) IMPLANT
NDL HYPO 21X1.5 SAFETY (NEEDLE) ×1 IMPLANT
NDL HYPO 25X1 1.5 SAFETY (NEEDLE) ×1 IMPLANT
NDL SPNL 22GX3.5 QUINCKE BK (NEEDLE) ×1 IMPLANT
NEEDLE HYPO 18GX1.5 BLUNT FILL (NEEDLE) IMPLANT
NEEDLE HYPO 21X1.5 SAFETY (NEEDLE) ×9 IMPLANT
NEEDLE HYPO 25X1 1.5 SAFETY (NEEDLE) IMPLANT
NEEDLE SPNL 22GX3.5 QUINCKE BK (NEEDLE) ×3 IMPLANT
NS IRRIG 1000ML POUR BTL (IV SOLUTION) ×3 IMPLANT
PACK LAMINECTOMY NEURO (CUSTOM PROCEDURE TRAY) ×3 IMPLANT
PACK UNIVERSAL I (CUSTOM PROCEDURE TRAY) ×3 IMPLANT
PATTIES SURGICAL .5X1.5 (GAUZE/BANDAGES/DRESSINGS) IMPLANT
PIN MAYFIELD SKULL DISP (PIN) ×3 IMPLANT
RUBBERBAND STERILE (MISCELLANEOUS) IMPLANT
SPONGE INTESTINAL PEANUT (DISPOSABLE) ×2 IMPLANT
SPONGE NEURO XRAY DETECT 1X3 (DISPOSABLE) ×1 IMPLANT
SPONGE SURGIFOAM ABS GEL SZ50 (HEMOSTASIS) ×3 IMPLANT
STAPLER SKIN PROX WIDE 3.9 (STAPLE) ×3 IMPLANT
STRIP SURGICAL 1 X 6 IN (GAUZE/BANDAGES/DRESSINGS) IMPLANT
STRIP SURGICAL 1/2 X 6 IN (GAUZE/BANDAGES/DRESSINGS) IMPLANT
STRIP SURGICAL 1/4 X 6 IN (GAUZE/BANDAGES/DRESSINGS) IMPLANT
STRIP SURGICAL 3/4 X 6 IN (GAUZE/BANDAGES/DRESSINGS) IMPLANT
SUT VIC AB 0 CT1 18XCR BRD8 (SUTURE) ×1 IMPLANT
SUT VIC AB 0 CT1 8-18 (SUTURE) ×3
SUT VIC AB 2-0 CT1 18 (SUTURE) ×3 IMPLANT
SUT VIC AB 3-0 SH 8-18 (SUTURE) ×2 IMPLANT
SUT VICRYL 4-0 PS2 18IN ABS (SUTURE) ×2 IMPLANT
SYR 20CC LL (SYRINGE) ×2 IMPLANT
SYR 30ML LL (SYRINGE) ×3 IMPLANT
SYR 3ML LL SCALE MARK (SYRINGE) IMPLANT
TOWEL OR 17X24 6PK STRL BLUE (TOWEL DISPOSABLE) ×3 IMPLANT
TOWEL OR 17X26 10 PK STRL BLUE (TOWEL DISPOSABLE) ×3 IMPLANT
TRAY FOLEY W/METER SILVER 14FR (SET/KITS/TRAYS/PACK) IMPLANT
UNDERPAD 30X30 INCONTINENT (UNDERPADS AND DIAPERS) ×3 IMPLANT
WATER STERILE IRR 1000ML POUR (IV SOLUTION) ×3 IMPLANT

## 2015-06-26 NOTE — Anesthesia Procedure Notes (Signed)
Procedure Name: Intubation Date/Time: 06/26/2015 3:50 PM Performed by: Rejeana Brock L Pre-anesthesia Checklist: Patient identified, Timeout performed, Emergency Drugs available, Suction available and Patient being monitored Patient Re-evaluated:Patient Re-evaluated prior to inductionOxygen Delivery Method: Circle system utilized Preoxygenation: Pre-oxygenation with 100% oxygen Intubation Type: IV induction Ventilation: Mask ventilation without difficulty Laryngoscope Size: Glidescope and 3 (head and neck maintained midline and neutral throughout intubation and masking) Grade View: Grade I Tube type: Oral Tube size: 7.5 mm Number of attempts: 1 Airway Equipment and Method: Stylet and Video-laryngoscopy Placement Confirmation: ETT inserted through vocal cords under direct vision,  positive ETCO2 and breath sounds checked- equal and bilateral Secured at: 22 cm Tube secured with: Tape Dental Injury: Teeth and Oropharynx as per pre-operative assessment

## 2015-06-26 NOTE — Transfer of Care (Signed)
Immediate Anesthesia Transfer of Care Note  Patient: Diana [name]  Procedure(s) Performed: Procedure(s) with comments: Cervical three-four POSTERIOR CERVICAL LAMINECTOMY for decompression  (N/A) - C34 laminectomy  Patient Location: PACU  Anesthesia Type:General  Level of Consciousness: awake  Airway & Oxygen Therapy: Patient Spontanous Breathing and Patient connected to face mask oxygen  Post-op Assessment: Report given to RN, Post -op Vital signs reviewed and stable and Patient moving all extremities X 4  Post vital signs: stable  Last Vitals:  Filed Vitals:   06/26/15 1246  BP: 152/61  Pulse: 60  Temp: 36.8 C  Resp: 20    Complications: No apparent anesthesia complications

## 2015-06-26 NOTE — Anesthesia Postprocedure Evaluation (Signed)
Anesthesia Post Note  Patient: Diana [name]  Procedure(s) Performed: Procedure(s) (LRB): Cervical three-four POSTERIOR CERVICAL LAMINECTOMY for decompression  (N/A)  Patient location during evaluation: PACU Anesthesia Type: General Level of consciousness: sedated Pain management: pain level controlled Vital Signs Assessment: post-procedure vital signs reviewed and stable Respiratory status: spontaneous breathing and respiratory function stable Cardiovascular status: stable Anesthetic complications: no    Last Vitals:  Filed Vitals:   06/26/15 1758 06/26/15 1800  BP:    Pulse: 64   Temp:  36.4 C  Resp: 8     Last Pain:  Filed Vitals:   06/26/15 1801  PainSc: 0-No pain    LLE Motor Response: Purposeful movement;Responds to commands (06/26/15 1800) LLE Sensation: No numbness (06/26/15 1800) RLE Motor Response: Purposeful movement;Responds to commands (06/26/15 1800) RLE Sensation: No numbness (06/26/15 1800)      [place]

## 2015-06-26 NOTE — Progress Notes (Signed)
No acute events Exam unchanged All questions answered Ready for surgery

## 2015-06-26 NOTE — Op Note (Signed)
06/26/2015  5:17 PM  PATIENT:  [name]  82 y.o. female  PRE-OPERATIVE DIAGNOSIS:  Cervical spondylosis with myelopathy, cervical stenosis C3-4  POST-OPERATIVE DIAGNOSIS:  Same  PROCEDURE:  C3-4 laminectomy for decompression  SURGEON:  Aldean Ast, MD  ASSISTANTS: Sherley Bounds, M.D.  ANESTHESIA:   General   DRAINS: None  SPECIMEN:  None  INDICATION FOR PROCEDURE: 82 year old female with rapidly progressive cervical myelopathy. Over the past 3 weeks she has become unable to stand even with supportive devices and she has essentially lost the ability to raise her arms or use her hands.  She has cervical MRI that shows multilevel spondylosis and critical stenosis at C3-4 with spinal cord compression and myelomalacia. Patient understood the risks, benefits, and alternatives and potential outcomes and wished to proceed.  PROCEDURE DETAILS: After smooth induction of general endotracheal anesthesia Mayfield pins were applied, the patient was turned prone onto chest rolls on the OR table and the Mayfield was fixated to the bed. A small amount of hair was clipped. The skin was wiped down with alcohol. Lidocaine with epinephrine was injected along the midline. A preoperative x-ray was taken which showed the needle at the top of the C3 lamina. The patient was prepped and draped in the usual sterile fashion.  A midline incision was made. Dissection was carried down in the avascular plane to the spinous processes of C2-C4. Subperiosteal dissection was performed to expose the C3 lamina as well as the superior portion of C4. Using a high-speed drill, Leksell rongeur, and Kerrison rongeurs the entire C3 lamina and the top of the C4 lamina were removed. Hypertrophied ligamentum flavum was removed to the lateral aspect of the thecal sac. I was satisfied that adequate decompression at C3-C4.  Hemostasis was obtained. The wound was irrigated with bacitracin saline. Approximately 250 mg of  vancomycin powder was placed in the deep aspect of the wound. The wound was then closed in layers with interrupted Vicryl sutures. Exparel was injected into the cervical muscles and subdermal tissues. The skin was closed with a running Vicryl suture. The skin was sealed with Dermabond. A sterile dressing was applied. The patient was returned to the supine position on the bed and Mayfield pins were removed.  PATIENT DISPOSITION:  PACU - hemodynamically stable.   Delay start of Pharmacological VTE agent (>24hrs) due to surgical blood loss or risk of bleeding:  yes

## 2015-06-26 NOTE — Progress Notes (Signed)
Awake Moving all extremities spontaneously Not yet following commands Observe overnight in ICU

## 2015-06-26 NOTE — Anesthesia Preprocedure Evaluation (Addendum)
Anesthesia Evaluation  Patient identified by MRN, date of birth, ID band Patient awake    Reviewed: Allergy & Precautions, NPO status , Patient's Chart, lab work & pertinent test results, reviewed documented beta blocker date and time   History of Anesthesia Complications (+) Family history of anesthesia reaction  Airway Mallampati: II  TM Distance: >3 FB Neck ROM: Full    Dental  (+) Upper Dentures, Partial Lower, Poor Dentition   Pulmonary pneumonia, resolved, COPD, former smoker,    Pulmonary exam normal breath sounds clear to auscultation       Cardiovascular hypertension, Pt. on medications and Pt. on home beta blockers Normal cardiovascular exam+ Valvular Problems/Murmurs  Rhythm:Regular Rate:Normal   Echo 05/2015-Mild AS 0.94 cm2 LVEF 60-65%   Neuro/Psych Weakness arms and legs due to spinal cord compression negative psych ROS   GI/Hepatic negative GI ROS, Neg liver ROS,   Endo/Other  Hypothyroidism Hx/o breast Ca  Renal/GU negative Renal ROS  negative genitourinary   Musculoskeletal  (+) Arthritis ,   Abdominal   Peds  Hematology negative hematology ROS (+)   Anesthesia Other Findings   Reproductive/Obstetrics                           Anesthesia Physical Anesthesia Plan  ASA: III  Anesthesia Plan: General   Post-op Pain Management:    Induction: Intravenous  Airway Management Planned: Oral ETT  Additional Equipment: Arterial line  Intra-op Plan:   Post-operative Plan: Extubation in OR  Informed Consent: I have reviewed the patients History and Physical, chart, labs and discussed the procedure including the risks, benefits and alternatives for the proposed anesthesia with the patient or authorized representative who has indicated his/her understanding and acceptance.   Dental advisory given  Plan Discussed with: Anesthesiologist, CRNA and Surgeon  Anesthesia Plan  Comments:        Anesthesia Quick Evaluation

## 2015-06-26 NOTE — H&P (Signed)
This 82 year old female presents for neck pain.  History of Present Illness: 1.  neck pain  I saw Diana [name] in neurosurgery clinic today.  She has had progressive difficulty walking over the past year.  She reports that it started with decreased lower extremity strength.  Over the past 3 weeks she has developed rapidly progressive worsening lower extremity strength to the point that she is wheelchair-bound and she is also lost coordination and strength in her upper extremities.  She reports it feels as though her hands on them, which I believe suggest they feel scratchy.  She denies changes in bowel or bladder control.  She recently had an MRI of her cervical spine that shows cord compression with myelomalacia at C3 C4.  She has only taken nonsteroidal anti-inflammatories.  This was not helpful.        PAST MEDICAL/SURGICAL HISTORY   (Detailed)  Disease/disorder Onset Date Management Date Comments    Mastectomy, left breast  TKJ 06/25/2015 -    hemorrhoids  TKJ 06/25/2015 -     PAST MEDICAL HISTORY, SURGICAL HISTORY, FAMILY HISTORY, SOCIAL HISTORY AND REVIEW OF SYSTEMS I have reviewed the patient's past medical, surgical, family and social history as well as the comprehensive review of systems as included on the [facility] history form dated 06/25/2015, which I have signed.  Family History  (Detailed)   SOCIAL HISTORY  (Detailed) Preferred language is [place].   HOME ENVIRONMENT/SAFETY  The Patient has fallen 1 times in the last year.        MEDICATIONS(added, continued or stopped this visit): Started Medication Directions Instruction Stopped   amlodipine 10 mg tablet take 1 tablet by oral route  every day     azithromycin 250 mg tablet take 2 tablet by oral route  every day for 1 day then 1 tablet (250 mg) by oral route once daily for 4 days     benzonatate 200 mg capsule take 1 capsule by oral route 3 times every day as needed for cough      cholecalciferol (vitamin D3) 1,000 unit capsule      furosemide 40 mg/4 mL oral solution take 4 milliliter by oral route  every day     hydrocortisone 2.5 % topical cream with perineal applicator apply by topical route 2- 4 times every day a thin layer to the affected area(s)     meloxicam 7.5 mg tablet take 1 tablet by oral route  every 2 days     metoprolol succinate ER 50 mg tablet,extended release 24 hr take 1 tablet by oral route  every day     Tirosint 100 mcg capsule take 1 capsule by oral route  every day     valsartan 160 mg tablet take 1 tablet by oral route  every 2 days       ALLERGIES: Ingredient Reaction Medication Name Comment  MEPERIDINE HCL Unknown DEMEROL    Reviewed, updated.    Vitals Date Temp F BP Pulse Ht In Wt Lb BMI BSA Pain Score  06/25/2015  140/68 66 64 175 30.04  0/10     PHYSICAL EXAM General Level of Distress: no acute distress Overall Appearance: normal  Head and Face  Right Left  Fundoscopic Exam:  normal normal    Cardiovascular Cardiac: regular rate and rhythm without murmur  Respiratory Lungs: normal  Neurological Orientation: normal Attention Span and Concentration:   normal  Musculoskeletal Gait and Station: normal  Right Left Upper Extremity  Muscle Strength: normal normal Lower Extremity Muscle Strength: normal normal Upper Extremity Muscle Tone:  normal normal Lower Extremity Muscle Tone: normal normal  Motor Strength Upper and lower extremity motor strength was tested in the clinically pertinent muscles. Any abnormal findings will be noted below.   Right Left Deltoid: 2/5 2/5 Biceps: 2/5 2/5 Triceps: 3/5 3/5 Brachioradialis: 2/5 2/5 Grip: 3/5 3/5 Finger Extensor: 3/5 3/5 Hip Flexor: 3/5 3/5  Gaze Normal Horizontal Gaze Stability: normal Horizontal Nystagmus:  present Vertical Gaze Stability:  normal Vertical Nystagmus:  present  Near Point Convergence: normal  Deep Tendon  Reflexes  Right Left Biceps: increase increase Triceps: increase increase Brachiloradialis: increase increase Patellar: increase increase Achilles: increase increase  Sensory Sensation was tested at C2 to T1 and L1 to S1.   Cranial Nerves II. Optic Nerve/Visual Fields: normal III. Oculomotor: normal IV. Trochlear: normal V. Trigeminal: normal VI. Abducens: normal VII. Facial: normal VIII. Acoustic/Vestibular: normal IX. Glossopharyngeal: normal X. Vagus: normal XI. Spinal Accessory: normal XII. Hypoglossal: normal  Motor and other Tests    Right Left Hoffman's: present present Clonus: normal normal     DIAGNOSTIC RESULTS Leanna Sato and we reviewed a noncontrasted cervical MR study from June 24, 2015.  She has multilevel cervical spondylosis.  She has multilevel foraminal stenosis.  At C3 C4 she has circumferential canal stenosis with spinal cord compression and myelomalacia.  She does not have any spinal cord compression at other levels.    IMPRESSION Diana [name] is an 82 year old Caucasian woman with relatively rapidly progressive cervical myelopathy.  She has multilevel cervical degenerative disease, however she only has one level of spinal cord compression.  Given her advanced age and her condition I think we should focus only on the level of spinal cord compression.  I don't think she would tolerate a procedure to address the neuroforaminal stenosis at each level.  That would also require a fusion operation.  I have recommended a C3-C4 cervical laminectomy for decompression.We had a long discussion, in language she understands, about the details of the procedure and its risks and benefits.  We also discussed alternatives to the procedure and the risks and benefits of those.  She asked appropriate questions and wishes to proceed with surgery.  I explained that the first goal of the surgery is to halt the progression of her symptoms.  She may have some degree of neurological  recovery but I am not particularly optimistic about that.  I certainly expect that without surgery she will have progressive neurological deterioration.  She and her son understand this and wish to proceed.  Completed Orders (this encounter) Order Details Reason Side Interpretation Result Initial Treatment Date Region  Lifestyle education Patient to follow up with primary care provider        Lifestyle education regarding diet Patient encouraged to eat a well balenced diet.         Assessment/Plan # Detail Type Description   1. Assessment Cervical spondylosis with myelopathy (M47.12).       2. Assessment Essential (primary) hypertension (I10).       3. Assessment Body mass index (BMI) 30.0-30.9, adult (Z68.30).   Plan Orders Today's instructions / counseling include(s) Lifestyle education regarding diet.         Pain Assessment/Treatment Pain Scale: 0/10. Method: Numeric Pain Intensity Scale. Location: neck. Onset: 06/11/2015. Duration: varies. Quality: no pain. Pain Assessment/Treatment follow-up plan of care: Patient taking medication as prescribed.  Fall Risk Plan The Patient has fallen 1 times in the  last year.  Falls risk follow-up plan of care: Assisted devices: Advise to use safety measures when available.  C3-C4 laminectomy for decompression tomorrow afternoon  Orders: Instruction(s)/Education: Assessment Instruction  I10 Lifestyle education  Z68.30 Lifestyle education regarding diet             Provider:  Lenna Sciara Audel Coakley MD  06/25/2015 12:50 PM Dictation edited by: Remonia Richter Lataria Courser    CC Providers: [place], [place] [phone]-              Electronically signed by Lenna Sciara Ashtan Laton MD on 06/25/2015 12:50 PM

## 2015-06-26 NOTE — Progress Notes (Signed)
Noted R eye with purplish discoloration upon arrival to PACU, per CRNA Dr. Cyndy Freeze aware.

## 2015-06-27 ENCOUNTER — Encounter (HOSPITAL_COMMUNITY): Payer: Self-pay | Admitting: Neurological Surgery

## 2015-06-27 DIAGNOSIS — I10 Essential (primary) hypertension: Secondary | ICD-10-CM | POA: Diagnosis not present

## 2015-06-27 DIAGNOSIS — M6281 Muscle weakness (generalized): Secondary | ICD-10-CM | POA: Diagnosis not present

## 2015-06-27 DIAGNOSIS — R5381 Other malaise: Secondary | ICD-10-CM | POA: Diagnosis not present

## 2015-06-27 DIAGNOSIS — M5002 Cervical disc disorder with myelopathy, mid-cervical region, unspecified level: Secondary | ICD-10-CM | POA: Diagnosis not present

## 2015-06-27 DIAGNOSIS — E039 Hypothyroidism, unspecified: Secondary | ICD-10-CM | POA: Diagnosis not present

## 2015-06-27 DIAGNOSIS — E038 Other specified hypothyroidism: Secondary | ICD-10-CM | POA: Diagnosis not present

## 2015-06-27 DIAGNOSIS — Z9889 Other specified postprocedural states: Secondary | ICD-10-CM | POA: Diagnosis not present

## 2015-06-27 DIAGNOSIS — D508 Other iron deficiency anemias: Secondary | ICD-10-CM | POA: Diagnosis not present

## 2015-06-27 DIAGNOSIS — N189 Chronic kidney disease, unspecified: Secondary | ICD-10-CM | POA: Diagnosis not present

## 2015-06-27 DIAGNOSIS — M549 Dorsalgia, unspecified: Secondary | ICD-10-CM | POA: Diagnosis not present

## 2015-06-27 DIAGNOSIS — J449 Chronic obstructive pulmonary disease, unspecified: Secondary | ICD-10-CM | POA: Diagnosis not present

## 2015-06-27 DIAGNOSIS — N3001 Acute cystitis with hematuria: Secondary | ICD-10-CM | POA: Diagnosis not present

## 2015-06-27 DIAGNOSIS — D72829 Elevated white blood cell count, unspecified: Secondary | ICD-10-CM | POA: Diagnosis not present

## 2015-06-27 DIAGNOSIS — N390 Urinary tract infection, site not specified: Secondary | ICD-10-CM | POA: Diagnosis not present

## 2015-06-27 DIAGNOSIS — M4712 Other spondylosis with myelopathy, cervical region: Secondary | ICD-10-CM | POA: Diagnosis not present

## 2015-06-27 DIAGNOSIS — R2681 Unsteadiness on feet: Secondary | ICD-10-CM | POA: Diagnosis not present

## 2015-06-27 DIAGNOSIS — R269 Unspecified abnormalities of gait and mobility: Secondary | ICD-10-CM | POA: Diagnosis not present

## 2015-06-27 DIAGNOSIS — R300 Dysuria: Secondary | ICD-10-CM | POA: Diagnosis not present

## 2015-06-27 DIAGNOSIS — Z4789 Encounter for other orthopedic aftercare: Secondary | ICD-10-CM | POA: Diagnosis not present

## 2015-06-27 DIAGNOSIS — R05 Cough: Secondary | ICD-10-CM | POA: Diagnosis not present

## 2015-06-27 LAB — CBC
HCT: 39.1 % (ref 36.0–46.0)
Hemoglobin: 13.3 g/dL (ref 12.0–15.0)
MCH: 30.6 pg (ref 26.0–34.0)
MCHC: 34 g/dL (ref 30.0–36.0)
MCV: 90.1 fL (ref 78.0–100.0)
Platelets: 214 10*3/uL (ref 150–400)
RBC: 4.34 MIL/uL (ref 3.87–5.11)
RDW: 12.9 % (ref 11.5–15.5)
WBC: 10.8 10*3/uL — AB (ref 4.0–10.5)

## 2015-06-27 LAB — BASIC METABOLIC PANEL
Anion gap: 13 (ref 5–15)
BUN: 11 mg/dL (ref 4–21)
BUN: 11 mg/dL (ref 6–20)
CHLORIDE: 98 mmol/L — AB (ref 101–111)
CO2: 23 mmol/L (ref 22–32)
Calcium: 8.9 mg/dL (ref 8.9–10.3)
Creatinine, Ser: 0.76 mg/dL (ref 0.44–1.00)
Creatinine: 0.8 mg/dL (ref 0.5–1.1)
GFR calc non Af Amer: >60 mL/min (ref >60)
Glucose, Bld: 131 mg/dL — ABNORMAL HIGH (ref 65–99)
Glucose: 131 mg/dL
POTASSIUM: 4.2 mmol/L (ref 3.5–5.1)
SODIUM: 134 mmol/L — AB (ref 135–145)
Sodium: 134 mmol/L — AB (ref 137–147)

## 2015-06-27 LAB — CBC AND DIFFERENTIAL: WBC: 10.8 10*3/mL

## 2015-06-27 MED ORDER — BISACODYL 5 MG PO TBEC: 5.0000 mg | DELAYED_RELEASE_TABLET | Freq: Every day | ORAL | Status: DC | PRN

## 2015-06-27 MED ORDER — METHOCARBAMOL 500 MG PO TABS: 500.0000 mg | ORAL_TABLET | Freq: Four times a day (QID) | ORAL | Status: DC | PRN

## 2015-06-27 MED ORDER — DOCUSATE SODIUM 100 MG PO CAPS: 100.0000 mg | ORAL_CAPSULE | Freq: Two times a day (BID) | ORAL | Status: DC

## 2015-06-27 MED ORDER — HYDROCODONE-ACETAMINOPHEN 5-325 MG PO TABS: 1.0000 | ORAL_TABLET | ORAL | Status: DC | PRN

## 2015-06-27 NOTE — Evaluation (Signed)
Physical Therapy Evaluation Patient Details Name: Diana [name] MRN: [id number] DOB: [birth year] Today's Date: 06/27/2015   History of Present Illness  pt presents after progressive weakness and difficulties with mobility.  pt found to have cord compression at C3-4 and underwent C3-4 Cervical Lami.  pt with hx of HTN, Breast CA, R Mastectomy, RLS, and COPD.    Clinical Impression  Pt generally weak and debilitated requiring A for all aspects of mobility.  Feel pt will need SNF level of care at D/C to maximize independence and decrease burden of care.      Follow Up Recommendations SNF    Equipment Recommendations  None recommended by PT    Recommendations for Other Services       Precautions / Restrictions Precautions Precautions: Fall;Cervical Restrictions Weight Bearing Restrictions: No      Mobility  Bed Mobility Overal bed mobility: Needs Assistance;+2 for physical assistance Bed Mobility: Supine to Sit;Sit to Sidelying     Supine to sit: Mod assist;HOB elevated   Sit to sidelying: Max assist;+2 for physical assistance General bed mobility comments: pt needed increased A for returning to supine.  Max cueing provided for safe technique with pt having difficulty following all directions.    Transfers                    Ambulation/Gait                Stairs            Wheelchair Mobility    Modified Rankin (Stroke Patients Only)       Balance Overall balance assessment: Needs assistance Sitting-balance support: Bilateral upper extremity supported;Feet supported Sitting balance-Leahy Scale: Poor Sitting balance - Comments: pt needs consistent Min - ModA to maintain sitting balance.  pt does indicate feeling when she is leaning posteriorly, but limited ability/attention to correcting LOB.   Postural control: Posterior lean                                   Pertinent Vitals/Pain Pain Assessment: 0-10 Pain Score: 8  Pain  Location: Neck and shoulders Pain Descriptors / Indicators: Grimacing;Aching Pain Intervention(s): Monitored during session;Repositioned;Patient requesting pain meds-RN notified    Home Living Family/patient expects to be discharged to:: Skilled nursing facility                      Prior Function Level of Independence: Needs assistance         Comments: Symptoms had been progressively getting worse over the last year, but significantly worse the last 3 weeks resulting in pt needing A for all aspects of ADLs and mobility.       Hand Dominance        Extremity/Trunk Assessment   Upper Extremity Assessment: Defer to OT evaluation           Lower Extremity Assessment: Generalized weakness      Cervical / Trunk Assessment: Kyphotic  Communication   Communication: No difficulties  Cognition Arousal/Alertness: Awake/alert Behavior During Therapy: WFL for tasks assessed/performed Overall Cognitive Status: History of cognitive impairments - at baseline                      General Comments      Exercises        Assessment/Plan    PT Assessment All further PT needs can be met  in the next venue of care  PT Diagnosis Difficulty walking;Generalized weakness;Acute pain   PT Problem List Decreased strength;Decreased activity tolerance;Decreased balance;Decreased mobility;Decreased coordination;Decreased cognition;Decreased knowledge of use of DME;Decreased safety awareness;Decreased knowledge of precautions;Pain  PT Treatment Interventions     PT Goals (Current goals can be found in the Care Plan section) Acute Rehab PT Goals Patient Stated Goal: Back to normal PT Goal Formulation: All assessment and education complete, DC therapy    Frequency     Barriers to discharge        Co-evaluation PT/OT/SLP Co-Evaluation/Treatment: Yes Reason for Co-Treatment: For patient/therapist safety PT goals addressed during session: Mobility/safety with  mobility;Balance         End of Session Equipment Utilized During Treatment: Oxygen Activity Tolerance: Patient limited by fatigue;Patient limited by pain Patient left: in bed;with call bell/phone within reach;with family/visitor present Nurse Communication: Mobility status         Time: 3709-6438 PT Time Calculation (min) (ACUTE ONLY): 23 min   Charges:   PT Evaluation $PT Eval Moderate Complexity: 1 Procedure     PT G CodesCatarina Hartshorn, [place] [phone] 06/27/2015, 1:00 PM

## 2015-06-27 NOTE — Progress Notes (Signed)
No acute events AVSS Awake and alert Improved strength in upper extremities, especially grip Wound c/d/i Stable/improved To SNF today

## 2015-06-27 NOTE — Discharge Summary (Signed)
Date of Admission: 06/26/2015  Date of Discharge: 06/27/2015  Admission Diagnosis: Cervical spondylosis with myelopathy, C3-4 stenosis  Discharge Diagnosis: Same   Procedure Performed: C3-C4 laminectomy for decompression  Attending: Aldean Ast, MD  Hospital Course:  The patient is admitted to the hospital morning surgery. She is taking the operating room for the above listed operation. She tolerated this well. She was observed overnight in the ICU. She had an uneventful postoperative course. On postoperative day 1 she was transferred to skilled nursing facility in improved neurological condition.  Discharged Medications: Resume prior medications, Norco for pain, Robaxin for muscle spasms  Follow up: With me in 2 weeks    Medication List    TAKE these medications        ALPRAZolam 0.25 MG tablet  Commonly known as:  XANAX  Take 1-2 tablets 30 minutes prior to MRI.     amLODipine 10 MG tablet  Commonly known as:  NORVASC  TAKE 1 TABLET BY MOUTH DAILY     benzonatate 200 MG capsule  Commonly known as:  TESSALON  Take 1 capsule (200 mg total) by mouth 2 (two) times daily as needed for cough.     bisacodyl 5 MG EC tablet  Commonly known as:  DULCOLAX  Take 1 tablet (5 mg total) by mouth daily as needed for moderate constipation.     cholecalciferol 1000 units tablet  Commonly known as:  VITAMIN D  Take 1,000 Units by mouth daily.     docusate sodium 100 MG capsule  Commonly known as:  COLACE  Take 1 capsule (100 mg total) by mouth 2 (two) times daily.     furosemide 40 MG tablet  Commonly known as:  LASIX  TAKE 1 TABLET BY MOUTH DAILY     HYDROcodone-acetaminophen 5-325 MG tablet  Commonly known as:  NORCO/VICODIN  Take 1-2 tablets by mouth every 4 (four) hours as needed (mild pain).     hydrocortisone 2.5 % lotion  Apply 1 application topically daily as needed (for itching).     levothyroxine 100 MCG tablet  Commonly known as:  SYNTHROID, LEVOTHROID   TAKE 1 TABLET BY MOUTH DAILY     meloxicam 15 MG tablet  Commonly known as:  MOBIC  Take 1 tablet (15 mg total) by mouth daily.     methocarbamol 500 MG tablet  Commonly known as:  ROBAXIN  Take 1 tablet (500 mg total) by mouth every 6 (six) hours as needed for muscle spasms.     metoprolol succinate 50 MG 24 hr tablet  Commonly known as:  TOPROL-XL  TAKE 1 TABLET BY MOUTH DAILY     valsartan 160 MG tablet  Commonly known as:  DIOVAN  TAKE 1 TABLET BY MOUTH DAILY     Vitamin D (Ergocalciferol) 50000 units Caps capsule  Commonly known as:  DRISDOL  Take 1 capsule (50,000 Units total) by mouth every 7 (seven) days.

## 2015-06-27 NOTE — NC FL2 (Signed)
[place] LEVEL OF CARE SCREENING TOOL     IDENTIFICATION  Patient Name: Diana [name] Birthdate: 04/05/1924 Sex: female Admission Date (Current Location): 06/26/2015  [facility] and [place] Number:  Herbalist and Address:  The [place], [place] [address], [place], [place] [address]      Provider Number: [id number]  Attending Physician Name and Address:  Kevan Ny Ditty, MD  Relative Name and Phone Number:       Current Level of Care: Hospital Recommended Level of Care: [place] Prior Approval Number:    Date Approved/Denied:   PASRR Number: [id number]  Discharge Plan: SNF    Current Diagnoses: Patient Active Problem List   Diagnosis Date Noted  . Cervical spondylosis with myelopathy 06/26/2015  . Systolic murmur [id number]  . Near syncope 06/03/2015  . Weakness generalized 06/03/2015  . Hoarseness of voice 03/27/2015  . BPV (benign positional vertigo) 01/29/2015  . Dizziness and giddiness 08/27/2014  . Other fatigue 08/27/2014  . Open wound of right elbow 02/22/2014  . Rash and nonspecific skin eruption 01/03/2013  . Poor balance 10/07/2011  . Palpitations 04/28/2011  . Breast cancer, right breast ([place]) 03/29/2011  . Vitamin D deficiency 10/17/2009  . Prediabetes 10/17/2009  . COPD (chronic obstructive pulmonary disease) ([place]) 12/19/2007  . INSOMNIA, CHRONIC 10/25/2007  . BACK PAIN, LUMBAR 03/01/2007  . Hypothyroidism 01/18/2007  . HYPONATREMIA, MILD 01/18/2007  . HYPERTENSION 01/18/2007    Orientation RESPIRATION BLADDER Height & Weight     Self, Time, Situation, Place  Normal Continent Weight: 175 lb 0.7 oz (79.4 kg) Height:  5\' 4"  (162.6 cm)  BEHAVIORAL SYMPTOMS/MOOD NEUROLOGICAL BOWEL NUTRITION STATUS      Continent Diet  AMBULATORY STATUS COMMUNICATION OF NEEDS Skin   Extensive Assist Verbally Surgical wounds ([place])                       Personal Care Assistance Level  of Assistance  Bathing, Feeding, Dressing Bathing Assistance: Limited assistance Feeding assistance: Independent Dressing Assistance: Limited assistance     Functional Limitations Info  Sight, Hearing, Speech Sight Info: Adequate Hearing Info: Adequate Speech Info: Adequate    SPECIAL CARE FACTORS FREQUENCY  OT (By licensed OT), PT (By licensed PT)     PT Frequency: 3 OT Frequency: 3            Contractures Contractures Info: Not present    Additional Factors Info  Code Status, Allergies Code Status Info: Full Code Allergies Info: Meperidine Hcl           Current Medications (06/27/2015):  This is the current hospital active medication list Current Facility-Administered Medications  Medication Dose Route Frequency Provider Last Rate Last Dose  . 0.9 %  sodium chloride infusion   Intravenous Continuous Kevan Ny Ditty, MD 100 mL/hr at 06/26/15 2000    . 0.9 %  sodium chloride infusion  250 mL Intravenous Continuous Kevan Ny Ditty, MD      . acetaminophen (TYLENOL) tablet 650 mg  650 mg Oral Q4H PRN Kevan Ny Ditty, MD       Or  . acetaminophen (TYLENOL) suppository 650 mg  650 mg Rectal Q4H PRN Kevan Ny Ditty, MD      . amLODipine (NORVASC) tablet 10 mg  10 mg Oral Daily Kevan Ny Ditty, MD      . bisacodyl (DULCOLAX) EC tablet 5 mg  5 mg Oral Daily PRN Kevan Ny Ditty,  MD      . bupivacaine liposome (EXPAREL) 1.3 % injection 266 mg  20 mL Infiltration To NeurOR Kevan Ny Ditty, MD      . cholecalciferol (VITAMIN D) tablet 1,000 Units  1,000 Units Oral Daily Kevan Ny Ditty, MD   1,000 Units at 06/26/15 2000  . docusate sodium (COLACE) capsule 100 mg  100 mg Oral BID Kevan Ny Ditty, MD   100 mg at 06/26/15 2200  . furosemide (LASIX) tablet 40 mg  40 mg Oral Daily Kevan Ny Ditty, MD   40 mg at 06/26/15 2000  . HYDROcodone-acetaminophen (NORCO/VICODIN) 5-325 MG per tablet 1-2 tablet  1-2 tablet Oral Q4H PRN Kevan Ny Ditty, MD      . HYDROmorphone (DILAUDID) injection 0.5-1 mg  0.5-1 mg Intravenous Q2H PRN Kevan Ny Ditty, MD   0.5 mg at 06/26/15 2149  . irbesartan (AVAPRO) tablet 150 mg  150 mg Oral Daily Kevan Ny Ditty, MD   150 mg at 06/26/15 2000  . levothyroxine (SYNTHROID, LEVOTHROID) tablet 100 mcg  100 mcg Oral QAC breakfast Kevan Ny Ditty, MD   100 mcg at 06/27/15 [id number]  . menthol-cetylpyridinium (CEPACOL) lozenge 3 mg  1 lozenge Oral PRN Kevan Ny Ditty, MD       Or  . phenol (CHLORASEPTIC) mouth spray 1 spray  1 spray Mouth/Throat PRN Kevan Ny Ditty, MD      . methocarbamol (ROBAXIN) tablet 500 mg  500 mg Oral Q6H PRN Kevan Ny Ditty, MD       Or  . methocarbamol (ROBAXIN) 500 mg in dextrose 5 % 50 mL IVPB  500 mg Intravenous Q6H PRN Kevan Ny Ditty, MD      . metoprolol succinate (TOPROL-XL) 24 hr tablet 50 mg  50 mg Oral Daily Kevan Ny Ditty, MD      . ondansetron [facility]) injection 4 mg  4 mg Intravenous Q4H PRN Kevan Ny Ditty, MD      . oxyCODONE-acetaminophen (PERCOCET/ROXICET) 5-325 MG per tablet 1-2 tablet  1-2 tablet Oral Q4H PRN Kevan Ny Ditty, MD      . pantoprazole (PROTONIX) injection 40 mg  40 mg Intravenous QHS Kevan Ny Ditty, MD   40 mg at 06/26/15 2149  . senna (SENOKOT) tablet 8.6 mg  1 tablet Oral BID Kevan Ny Ditty, MD   8.6 mg at 06/26/15 2200  . sodium chloride flush (NS) 0.9 % injection 3 mL  3 mL Intravenous Q12H Kevan Ny Ditty, MD   10 mL at 06/26/15 2151  . sodium chloride flush (NS) 0.9 % injection 3 mL  3 mL Intravenous PRN Kevan Ny Ditty, MD      . sodium phosphate (FLEET) 7-19 GM/118ML enema 1 enema  1 enema Rectal Once PRN Kevan Ny Ditty, MD      . zolpidem (AMBIEN) tablet 5 mg  5 mg Oral QHS PRN Tamala Fothergill, MD         Discharge Medications: Please see discharge summary for a list of discharge medications.  Relevant Imaging Results:  Relevant Lab  Results:   Additional Information SSN [id number]  Barbette Or, [place]

## 2015-06-27 NOTE — Clinical Social Work Note (Signed)
Clinical Social Worker received notification this am from RN that patient family has already made arrangements for placement at [facility] today.  CSW called facility and confirmed that patient is planned for admission today.  CSW completed FL2.  CSW facilitated patient discharge including contacting patient family and facility to confirm patient discharge plans.  Clinical information faxed to facility and family agreeable with plan.  CSW arranged ambulance transport via [facility] to [facility]  RN to call report prior to discharge.  Clinical Social Worker will sign off for now as social work intervention is no longer needed. Please consult [place] again if new need arises.  Barbette Or, [place]

## 2015-06-27 NOTE — Evaluation (Signed)
Occupational Therapy Evaluation Patient Details Name: Diana [name] MRN: [id number] DOB: 1923/07/23 Today's Date: 06/27/2015    History of Present Illness pt presents after progressive weakness and difficulties with mobility.  pt found to have cord compression at C3-4 and underwent C3-4 Cervical Lami.  pt with hx of HTN, Breast CA, R Mastectomy, RLS, and COPD.     Clinical Impression   Pt admitted with above.  She presents to OT with the below listed deficits.  She requires mod - total A for ADLs.  Recommend SNF.    Follow Up Recommendations  SNF    Equipment Recommendations  None recommended by OT    Recommendations for Other Services       Precautions / Restrictions Precautions Precautions: Fall;Cervical Restrictions Weight Bearing Restrictions: No      Mobility Bed Mobility Overal bed mobility: Needs Assistance;+2 for physical assistance Bed Mobility: Supine to Sit;Sit to Sidelying     Supine to sit: Mod assist;HOB elevated   Sit to sidelying: Max assist;+2 for physical assistance General bed mobility comments: pt needed increased A for returning to supine.  Max cueing provided for safe technique with pt having difficulty following all directions.    Transfers                      Balance Overall balance assessment: Needs assistance Sitting-balance support: Bilateral upper extremity supported;Feet supported Sitting balance-Leahy Scale: Poor Sitting balance - Comments: pt needs consistent Min - ModA to maintain sitting balance.  pt does indicate feeling when she is leaning posteriorly, but limited ability/attention to correcting LOB.   Postural control: Posterior lean                                  ADL Overall ADL's : Needs assistance/impaired Eating/Feeding: Set up;Bed level   Grooming: Wash/dry hands;Wash/dry face;Oral care;Brushing hair;Moderate assistance;Bed level   Upper Body Bathing: Moderate assistance;Bed level   Lower  Body Bathing: Total assistance;Bed level   Upper Body Dressing : Total assistance;Bed level   Lower Body Dressing: Total assistance;Bed level   Toilet Transfer: Total assistance Toilet Transfer Details (indicate cue type and reason): unable to attempt safely  Toileting- Clothing Manipulation and Hygiene: Total assistance;Bed level       Functional mobility during ADLs: Moderate assistance;Maximal assistance;+2 for physical assistance (bed mobility )       Vision     Perception     Praxis      Pertinent Vitals/Pain Pain Assessment: 0-10 Pain Score: 8  Pain Location: neck  Pain Descriptors / Indicators: Grimacing;Aching Pain Intervention(s): Monitored during session;Repositioned     Hand Dominance Right   Extremity/Trunk Assessment Upper Extremity Assessment Upper Extremity Assessment: RUE deficits/detail;LUE deficits/detail RUE Deficits / Details: grossly 2/5 shoulder; 3+ - 4-/5 elbow distally.  Pt reports impaired sensation hands RUE Sensation: decreased light touch RUE Coordination: decreased fine motor LUE Deficits / Details: Pt with limited shoulder ROM due to old shoulder injury.  elbow distally 3+/5 - 4/5.  She complains tingling hands  LUE Sensation: decreased light touch LUE Coordination: decreased fine motor   Lower Extremity Assessment Lower Extremity Assessment: Defer to PT evaluation   Cervical / Trunk Assessment Cervical / Trunk Assessment: Kyphotic   Communication Communication Communication: No difficulties   Cognition Arousal/Alertness: Awake/alert Behavior During Therapy: WFL for tasks assessed/performed Overall Cognitive Status: History of cognitive impairments - at baseline  General Comments       Exercises       Shoulder Instructions      Home Living Family/patient expects to be discharged to:: Skilled nursing facility                                        Prior  Functioning/Environment Level of Independence: Needs assistance        Comments: Symptoms had been progressively getting worse over the last year, but significantly worse the last 3 weeks resulting in pt needing A for all aspects of ADLs and mobility.      OT Diagnosis: Generalized weakness;Cognitive deficits;Acute pain   OT Problem List: Decreased strength;Decreased range of motion;Decreased activity tolerance;Impaired balance (sitting and/or standing);Decreased coordination;Decreased safety awareness;Decreased knowledge of use of DME or AE;Decreased knowledge of precautions;Impaired UE functional use;Pain   OT Treatment/Interventions:      OT Goals(Current goals can be found in the care plan section) Acute Rehab OT Goals Patient Stated Goal: Back to normal OT Goal Formulation: All assessment and education complete, DC therapy  OT Frequency:     Barriers to D/C:            Co-evaluation PT/OT/SLP Co-Evaluation/Treatment: Yes Reason for Co-Treatment: For patient/therapist safety PT goals addressed during session: Mobility/safety with mobility;Balance OT goals addressed during session: Strengthening/ROM      End of Session Nurse Communication: Mobility status  Activity Tolerance: Patient tolerated treatment well Patient left: in bed;with call bell/phone within reach;with family/visitor present   Time: [id number] OT Time Calculation (min): 23 min Charges:  OT Evaluation $OT Eval Moderate Complexity: 1 Procedure G-Codes:    Diana [name] M 07-10-2015, 2:04 PM

## 2015-06-27 NOTE — Care Management Note (Signed)
Case Management Note  Patient Details  Name: Diana [name] MRN: [id number] Date of Birth: 03-25-1924  Subjective/Objective:   Pt admitted on 06/26/15 s/p C 3-4 cervical laminectomy.  PTA, pt had been having issues with mobility and function due to cord compression.  She and her family have arranged for her to dc to SNF for rehab post surgical procedure.                   Action/Plan: Pt to dc to [facility] today, per CSW arrangements.    Expected Discharge Date:    06/27/2015              Expected Discharge Plan:  Skilled Nursing Facility  In-House Referral:  Clinical Social Work  Discharge planning Services  CM Consult  Post Acute Care Choice:    Choice offered to:     DME Arranged:    DME Agency:     HH Arranged:    [place] Agency:     Status of Service:  Completed, signed off  Medicare Important Message Given:    Date Medicare IM Given:    Medicare IM give by:    Date Additional Medicare IM Given:    Additional Medicare Important Message give by:     If discussed at [place] of Stay Meetings, dates discussed:    Additional Comments:  Reinaldo Raddle, RN, BSN  Trauma/Neuro ICU Case Manager [phone]

## 2015-07-01 ENCOUNTER — Non-Acute Institutional Stay (SKILLED_NURSING_FACILITY): Payer: Medicare Other | Admitting: Internal Medicine

## 2015-07-01 ENCOUNTER — Encounter: Payer: Self-pay | Admitting: Internal Medicine

## 2015-07-01 DIAGNOSIS — R5381 Other malaise: Secondary | ICD-10-CM | POA: Diagnosis not present

## 2015-07-01 DIAGNOSIS — M4712 Other spondylosis with myelopathy, cervical region: Secondary | ICD-10-CM

## 2015-07-01 DIAGNOSIS — E038 Other specified hypothyroidism: Secondary | ICD-10-CM | POA: Diagnosis not present

## 2015-07-01 DIAGNOSIS — M1990 Unspecified osteoarthritis, unspecified site: Secondary | ICD-10-CM

## 2015-07-01 DIAGNOSIS — I10 Essential (primary) hypertension: Secondary | ICD-10-CM | POA: Diagnosis not present

## 2015-07-01 DIAGNOSIS — D72829 Elevated white blood cell count, unspecified: Secondary | ICD-10-CM

## 2015-07-01 DIAGNOSIS — K5900 Constipation, unspecified: Secondary | ICD-10-CM

## 2015-07-01 NOTE — Progress Notes (Signed)
LOCATION: [facility]  PCP: Eliezer Lofts, MD   Code Status: No Code  Goals of care: Advanced Directive information Advanced Directives 06/26/2015  Does patient have an advance directive? No  Would patient like information on creating an advanced directive? -       Extended Emergency Contact Information Primary Emergency Contact: Penna,Bruce Address: [address]          [place], [place] [address] Johnnette Litter of [place] Phone: [phone] Mobile Phone: [phone] Relation: Son   Allergies  Allergen Reactions  . Meperidine Hcl Nausea And Vomiting    Chief Complaint  Patient presents with  . New Admit To SNF    New Admission     HPI:  Patient is a 82 y.o. female seen today for short term rehabilitation post hospital admission from 06/26/15- 06/27/15 with cervical spondylosis with myelopathy and C3-4 stenosis. . She underwent C3C4 laminectomy and decompression. She is seen in her room today. Her pain is under control with current pain regimen. She complaints of weakness to her arms and legs.   Review of Systems:  Constitutional: Negative for fever, chills. Positive for easy fatigue.  HENT: Negative for headache, congestion, nasal discharge, hearing loss, sore throat, difficulty swallowing. Eyes: Negative for blurred vision, double vision and discharge.  Respiratory: Negative for cough, shortness of breath and wheezing.   Cardiovascular: Negative for chest pain, palpitations, leg swelling.  Gastrointestinal: Negative for heartburn, nausea, vomiting, abdominal pain. Last bowel movement was today.  Genitourinary: Negative for dysuria and flank pain.  Musculoskeletal: Negative for back pain, fall in the facility.  Skin: Negative for itching, rash.  Neurological: Negative for dizziness. Psychiatric/Behavioral: Negative for depression   Past Medical History  Diagnosis Date  . Other abnormal glucose   . Unspecified vitamin D deficiency   . Urinary tract  infection, site not specified   . Persistent disorder of initiating or maintaining sleep   . Pain in joint, shoulder region   . Palpitations   . Bacterial pneumonia, unspecified   . Candidiasis of mouth   . Unspecified viral infection, in conditions classified elsewhere and of unspecified site   . Lumbago   . Unspecified essential hypertension   . Hyposmolality and/or hyponatremia   . Unspecified hypothyroidism   . Cancer , breast   . Encephalitis 1961  . Chronic airway obstruction, not elsewhere classified     son not aware of this  . Obstructive chronic bronchitis with exacerbation ([place])   . Restless leg syndrome   . Arthritis   . Family history of adverse reaction to anesthesia     son has nausea  . Heart murmur     has had it for years; very mild AS 05/2015 echo   Past Surgical History  Procedure Laterality Date  . Abi  04/19/05    Negative  . Venous doppler  04/19/05    LE normal  . Back mri  2005    mild bulging disks  . Mastectomy, radical    . Eye surgery Bilateral     Cataract surgery   . Hemorrhoid surgery    . Posterior cervical laminectomy N/A 06/26/2015    Procedure: Cervical three-four POSTERIOR CERVICAL LAMINECTOMY for decompression ;  Surgeon: Kevan Ny Ditty, MD;  Location: [facility];  Service: Neurosurgery;  Laterality: N/A;  C34 laminectomy   Social History:   reports that she has quit smoking. Her smoking use included Cigarettes. She has a 21 pack-year smoking history. She has  never used smokeless tobacco. She reports that she does not drink alcohol or use illicit drugs.  Family History  Problem Relation Age of Onset  . Rheum arthritis Father   . Stroke Mother   . Diabetes Mother   . Healthy Sister   . Healthy Sister   . Liver cancer      Aunt  . Stomach cancer      Aunt    Medications:   Medication List       This list is accurate as of: 07/01/15 10:04 AM.  Always use your most recent med list.               amLODipine 10 MG tablet   Commonly known as:  NORVASC  TAKE 1 TABLET BY MOUTH DAILY     bisacodyl 5 MG EC tablet  Commonly known as:  DULCOLAX  Take 1 tablet (5 mg total) by mouth daily as needed for moderate constipation.     cholecalciferol 1000 units tablet  Commonly known as:  VITAMIN D  Take 1,000 Units by mouth daily.     docusate sodium 100 MG capsule  Commonly known as:  COLACE  Take 1 capsule (100 mg total) by mouth 2 (two) times daily.     ergocalciferol 50000 units capsule  Commonly known as:  VITAMIN D2  Take 50,000 Units by mouth once a week.     furosemide 40 MG tablet  Commonly known as:  LASIX  TAKE 1 TABLET BY MOUTH DAILY     HYDROcodone-acetaminophen 5-325 MG tablet  Commonly known as:  NORCO/VICODIN  Take 1-2 tablets by mouth every 4 (four) hours as needed (mild pain).     hydrocortisone 2.5 % lotion  Apply 1 application topically daily as needed (for itching).     levothyroxine 100 MCG tablet  Commonly known as:  SYNTHROID, LEVOTHROID  TAKE 1 TABLET BY MOUTH DAILY     losartan 100 MG tablet  Commonly known as:  COZAAR  Take 100 mg by mouth daily.     meloxicam 15 MG tablet  Commonly known as:  MOBIC  Take 15 mg by mouth daily.     methocarbamol 500 MG tablet  Commonly known as:  ROBAXIN  Take 1 tablet (500 mg total) by mouth every 6 (six) hours as needed for muscle spasms.     metoprolol succinate 50 MG 24 hr tablet  Commonly known as:  TOPROL-XL  TAKE 1 TABLET BY MOUTH DAILY        Immunizations: Immunization History  Administered Date(s) Administered  . Influenza Whole 01/18/2007, 02/15/2008, 01/28/2009, 01/27/2010, 01/27/2011  . Influenza,inj,Quad PF,36+ Mos 12/29/2012, 01/10/2014, 01/28/2015  . PPD Test 06/27/2015  . Pneumococcal Conjugate-13 01/10/2014  . Pneumococcal Polysaccharide-23 01/18/2007  . Td 02/22/2014  . Zoster 10/17/2009     Physical Exam: Filed Vitals:   07/01/15 0940  BP: 116/66  Pulse: 68  Temp: 98.7 F (37.1 C)  TempSrc: Oral    Resp: 18  Height: 5\' 4"  (1.626 m)  Weight: 175 lb (79.379 kg)  SpO2: 97%   Body mass index is 30.02 kg/(m^2).  General- elderly female, obese, in no acute distress Head- normocephalic, atraumatic Nose- no maxillary or frontal sinus tenderness, no nasal discharge Throat- moist mucus membrane Eyes- right periorbital hemorrhage, PERRLA, EOMI, no pallor, no icterus, no discharge, normal conjunctiva, normal sclera Neck- no cervical lymphadenopathy Cardiovascular- normal s1,s2, no murmur, no leg edema Respiratory- bilateral clear to auscultation, no wheeze, no rhonchi, no crackles, no  use of accessory muscles Abdomen- bowel sounds present, soft, non tender Musculoskeletal- able to move all 4 extremities, generalized weakness more to upper extremities, cervical incision with dressing in place Neurological- alert and oriented to person, place and time Skin- warm and dry, bruise to left hand Psychiatry- normal mood and affect    Labs reviewed: Basic Metabolic Panel:  Recent Labs  06/03/15 1418 06/26/15 1252 06/27/15 06/27/15 0530  NA 135 137 134* 134*  K 4.0 4.0  --  4.2  CL 98 101  --  98*  CO2 30 26  --  23  GLUCOSE 116* 101*  --  131*  BUN 15 15 11 11   CREATININE 0.92 0.86 0.8 0.76  CALCIUM 9.6 9.5  --  8.9   Liver Function Tests:  Recent Labs  08/27/14 1523 06/03/15 1418  AST 19 19  ALT 16 17  ALKPHOS 83 80  BILITOT 0.3 0.2  PROT 8.2 8.0  ALBUMIN 4.1 4.3   No results for input(s): LIPASE, AMYLASE in the last 8760 hours. No results for input(s): AMMONIA in the last 8760 hours. CBC:  Recent Labs  08/27/14 1523 06/03/15 1418 06/26/15 1252 06/27/15 06/27/15 0530  WBC 8.6 10.9* 9.2 10.8 10.8*  NEUTROABS 4.6 7.2  --   --   --   HGB 13.8 13.4 14.0  --  13.3  HCT 40.5 39.8 42.6  --  39.1  MCV 91.5 93.4 91.8  --  90.1  PLT 234.0 231.0 227  --  214    Radiological Exams: Dg Cervical Spine 2-3 Views  06/26/2015  CLINICAL DATA:  Imaging for surgical  localization for cervical spine surgery. EXAM: CERVICAL SPINE - 2-3 VIEW COMPARISON:  Cervical MRI, 06/24/2015 FINDINGS: Initial image shows placement of needle with its tip posterior to the posterior margin of the C2-C3 disc. Subsequent image shows placement of surgical probe. The tip projects 2 cm posterior to the posterior inferior margin of the C3 vertebral body. IMPRESSION: Surgical localization imaging as described. Electronically Signed   By: Lajean Manes M.D.   On: 06/26/2015 16:59   Dg Cervical Spine 2-3 Views  06/16/2015  CLINICAL DATA:  Numbness in upper extremities.  No recent trauma EXAM: CERVICAL SPINE - 2-3 VIEW COMPARISON:  None. FINDINGS: Frontal, lateral, and open-mouth odontoid images obtained. There is no demonstrable fracture or spondylolisthesis. Prevertebral soft tissues and predental space regions are normal. There is marked disc space narrowing at C6-7 with moderately severe disc space narrowing at C5-6. There are anterior osteophytes at C4, C5, C6, and C7. No erosive change. Bones are osteoporotic. There is extensive calcification in each carotid artery. IMPRESSION: Multilevel arthropathy. No fracture or spondylolisthesis. Bones osteoporotic. Carotid artery calcification bilaterally. Electronically Signed   By: Lowella Grip III M.D.   On: 06/16/2015 11:30   Mr Cervical Spine Wo Contrast  06/24/2015  CLINICAL DATA:  Patient fell 1 year ago. Neck pain with upper and lower extremity weakness. Weak grip. EXAM: MRI CERVICAL SPINE WITHOUT CONTRAST TECHNIQUE: Multiplanar, multisequence MR imaging of the cervical spine was performed. No intravenous contrast was administered. COMPARISON:  Cervical spine radiographs 06/16/2015. No prior cross-sectional imaging is available. FINDINGS: Alignment: Reversal of the normal cervical lordotic curve. Trace anterolisthesis C3-C4. Vertebrae: No worrisome osseous lesion. Cord: Severe cord compression at C3-4. Canal diameter 5 mm. Abnormal cord signal  is present, indicating either cord edema, gliosis, or myelomalacia. Query sequelae of central cord syndrome. No blood products are evident. Severe facet arthropathy bilaterally, greater on the LEFT,  with joint effusion and bone marrow edema. Posterior Fossa: No tonsillar herniation. Vertebral Arteries: BILATERAL patent.  RIGHT dominant. Paraspinal tissues: No visible masses.  Suspected thyromegaly. Disc levels: The individual disc spaces were examined as follows: C2-3:  Mild bulge.  No impingement. C3-4: Central disc extrusion. Posterior element hypertrophy affecting facets and ligamentum flavum. Trace anterolisthesis. Severe spinal stenosis compressing the cord and resulting in abnormal cord signal. Canal diameter 5 mm. BILATERAL C4 nerve root impingement is also noted. C4-5: Trace anterolisthesis. Mild bulge. BILATERAL facet arthropathy, worse on the RIGHT. RIGHT C5 nerve root impingement. C5-6: Central and rightward disc osteophyte complex. Severe right-sided foraminal narrowing, less so on the LEFT. BILATERAL RIGHT greater than LEFT C6 nerve root impingement. Mild stenosis, with slight cord flattening. Canal diameter 7-8 mm. C6-7: Severe disc space narrowing. Central protrusion. Moderate stenosis with ligamentum flavum hypertrophy. Cord flattening. Canal diameter 6-7 mm. BILATERAL C7 nerve root impingement. C7-T1: Central protrusion. Asymmetric ligamentum flavum hypertrophy on the LEFT related to facet disease. LEFT C8 nerve root impingement is possible. No cord compression. IMPRESSION: Severe cord compression at C3-4 related to central disc extrusion and posterior element hypertrophy. Canal diameter 5 mm. Abnormal cord signal is noted, which could represent a combination of acute and chronic injury. Given the history of previous fall, the abnormal cord signal could represent sequelae of central cord syndrome. Similar less severe changes at C4-5, C5-6, and C6-7 with varying degrees of spinal stenosis and neural  impingement. Electronically Signed   By: Staci Righter M.D.   On: 06/24/2015 14:57    Assessment/Plan  Physical deconditioning Will have her work with physical therapy and occupational therapy team to help with gait training and muscle strengthening exercises.fall precautions. Skin care. Encourage to be out of bed.   Cervical spondylosis with myelopathy S/p laminectomy and decompression. Will have patient work with PT/OT as tolerated to regain strength and restore function.  Fall precautions are in place. Has follow up with neurosurgery. Continue norco 5-325 mg 1-2 tab q4h prn pain and robaxin 500 mg q6h prn muscle spasm.   Leukocytosis Afebrile. No signs of infection. Monitor cbc  HTN Stable bp, monitor bp, continue norvasc, metoprolol, diovan and lasix. Check bmp.  Constipation Stable, continue colace 100 mg bid with prn dulcolax  OA Continue mobic 15 mg daily with vitamin d supplement  Hypothyroidism Continue levothyroxine  Goals of care: short term rehabilitation   Labs/tests ordered: cbc with diff, bmp 07/02/15  Family/ staff Communication: reviewed care plan with patient and nursing supervisor    Blanchie Serve, MD Internal Medicine [place], [place] [address] Cell Phone (Monday-Friday 8 am - 5 pm): [phone] On Call: [phone] and follow prompts after 5 pm and on weekends Office Phone: [phone] Office Fax: [phone]

## 2015-07-02 ENCOUNTER — Telehealth: Payer: Self-pay | Admitting: Family Medicine

## 2015-07-02 LAB — BASIC METABOLIC PANEL
BUN: 16 mg/dL (ref 4–21)
CREATININE: 0.9 mg/dL (ref <=1.1)
GLUCOSE: 117 mg/dL
Potassium: 3.8 mmol/L (ref 3.4–5.3)
Sodium: 136 mmol/L — AB (ref 137–147)

## 2015-07-02 LAB — CBC AND DIFFERENTIAL
HCT: 40 % (ref 36–46)
HEMOGLOBIN: 13.4 g/dL (ref 12.0–16.0)
PLATELETS: 261 10*3/uL (ref 150–399)
WBC: 11.2 10*3/mL

## 2015-07-02 NOTE — Telephone Encounter (Signed)
-----   Message from Marchia Bond sent at 06/30/2015  1:55 PM EDT ----- Regarding: Cpx labs Tues 3/21, need orders. Thanks! :-) Please order  future cpx labs for pt's upcoming lab appt. Thanks Aniceto Boss

## 2015-07-03 ENCOUNTER — Non-Acute Institutional Stay (SKILLED_NURSING_FACILITY): Payer: Medicare Other | Admitting: Family

## 2015-07-03 ENCOUNTER — Encounter: Payer: Self-pay | Admitting: Family

## 2015-07-03 DIAGNOSIS — Z9889 Other specified postprocedural states: Secondary | ICD-10-CM | POA: Diagnosis not present

## 2015-07-03 DIAGNOSIS — D72829 Elevated white blood cell count, unspecified: Secondary | ICD-10-CM

## 2015-07-03 NOTE — Progress Notes (Deleted)
Location:  [facility] Room Number: 808 Place of Service:  SNF [phone]) Provider:  Marlowe Sax  NP  Eliezer Lofts, MD  Patient Care Team: Jinny Sanders, MD as PCP - General  Extended Emergency Contact Information Primary Emergency Contact: Milles,Bruce Address: [address]          [place], [place] [address] Johnnette Litter of [place] Phone: [phone] Mobile Phone: [phone] Relation: Son  Code Status:  Goals of care: Advanced Directive information Advanced Directives 07/03/2015  Does patient have an advance directive? No  Would patient like information on creating an advanced directive? No - patient declined information     Chief Complaint  Patient presents with  . Acute Visit    HPI:  Pt is a 82 y.o. female seen today for an acute visit for    Past Medical History  Diagnosis Date  . Other abnormal glucose   . Unspecified vitamin D deficiency   . Urinary tract infection, site not specified   . Persistent disorder of initiating or maintaining sleep   . Pain in joint, shoulder region   . Palpitations   . Bacterial pneumonia, unspecified   . Candidiasis of mouth   . Unspecified viral infection, in conditions classified elsewhere and of unspecified site   . Lumbago   . Unspecified essential hypertension   . Hyposmolality and/or hyponatremia   . Unspecified hypothyroidism   . Cancer , breast   . Encephalitis 1961  . Chronic airway obstruction, not elsewhere classified     son not aware of this  . Obstructive chronic bronchitis with exacerbation ([place])   . Restless leg syndrome   . Arthritis   . Family history of adverse reaction to anesthesia     son has nausea  . Heart murmur     has had it for years; very mild AS 05/2015 echo   Past Surgical History  Procedure Laterality Date  . Abi  04/19/05    Negative  . Venous doppler  04/19/05    LE normal  . Back mri  2005    mild bulging disks  . Mastectomy, radical    . Eye  surgery Bilateral     Cataract surgery   . Hemorrhoid surgery    . Posterior cervical laminectomy N/A 06/26/2015    Procedure: Cervical three-four POSTERIOR CERVICAL LAMINECTOMY for decompression ;  Surgeon: Kevan Ny Ditty, MD;  Location: [facility];  Service: Neurosurgery;  Laterality: N/A;  C34 laminectomy    Allergies  Allergen Reactions  . Meperidine Hcl Nausea And Vomiting      Medication List       This list is accurate as of: 07/03/15 10:29 AM.  Always use your most recent med list.               amLODipine 10 MG tablet  Commonly known as:  NORVASC  TAKE 1 TABLET BY MOUTH DAILY     bisacodyl 5 MG EC tablet  Commonly known as:  DULCOLAX  Take 1 tablet (5 mg total) by mouth daily as needed for moderate constipation.     cholecalciferol 1000 units tablet  Commonly known as:  VITAMIN D  Take 1,000 Units by mouth daily.     docusate sodium 100 MG capsule  Commonly known as:  COLACE  Take 1 capsule (100 mg total) by mouth 2 (two) times daily.     ergocalciferol 50000 units capsule  Commonly known as:  VITAMIN  D2  Take 50,000 Units by mouth once a week.     furosemide 40 MG tablet  Commonly known as:  LASIX  TAKE 1 TABLET BY MOUTH DAILY     HYDROcodone-acetaminophen 5-325 MG tablet  Commonly known as:  NORCO/VICODIN  Take 1-2 tablets by mouth every 4 (four) hours as needed (mild pain).     hydrocortisone 2.5 % lotion  Apply 1 application topically daily as needed (for itching).     levothyroxine 100 MCG tablet  Commonly known as:  SYNTHROID, LEVOTHROID  TAKE 1 TABLET BY MOUTH DAILY     losartan 100 MG tablet  Commonly known as:  COZAAR  Take 100 mg by mouth daily.     meloxicam 15 MG tablet  Commonly known as:  MOBIC  Take 15 mg by mouth daily.     methocarbamol 500 MG tablet  Commonly known as:  ROBAXIN  Take 1 tablet (500 mg total) by mouth every 6 (six) hours as needed for muscle spasms.     metoprolol succinate 50 MG 24 hr tablet    Commonly known as:  TOPROL-XL  TAKE 1 TABLET BY MOUTH DAILY        Review of Systems  Immunization History  Administered Date(s) Administered  . Influenza Whole 01/18/2007, 02/15/2008, 01/28/2009, 01/27/2010, 01/27/2011  . Influenza,inj,Quad PF,36+ Mos 12/29/2012, 01/10/2014, 01/28/2015  . PPD Test 06/27/2015  . Pneumococcal Conjugate-13 01/10/2014  . Pneumococcal Polysaccharide-23 01/18/2007  . Td 02/22/2014  . Zoster 10/17/2009   Pertinent  Health Maintenance Due  Topic Date Due  . DEXA SCAN  12/05/1990  . INFLUENZA VACCINE  11/18/2015  . PNA vac Low Risk Adult  Completed   Fall Risk  01/10/2014 12/29/2012 04/05/2012  Falls in the past year? Yes No No  Number falls in past yr: 1 - -  Injury with Fall? Yes - -  Risk for fall due to : Impaired mobility Impaired mobility -  Risk for fall due to (comments): Uses Cane with walking Use walking cane -   Functional Status Survey:    Filed Vitals:   07/03/15 1017  BP: 143/86  Pulse: 58  Temp: 98.6 F (37 C)  TempSrc: Oral  Resp: 17  Height: 5\' 4"  (1.626 m)  Weight: 175 lb (79.379 kg)   Body mass index is 30.02 kg/(m^2). Physical Exam  Labs reviewed:  Recent Labs  06/03/15 1418 06/26/15 1252 06/27/15 06/27/15 0530  NA 135 137 134* 134*  K 4.0 4.0  --  4.2  CL 98 101  --  98*  CO2 30 26  --  23  GLUCOSE 116* 101*  --  131*  BUN 15 15 11 11   CREATININE 0.92 0.86 0.8 0.76  CALCIUM 9.6 9.5  --  8.9    Recent Labs  08/27/14 1523 06/03/15 1418  AST 19 19  ALT 16 17  ALKPHOS 83 80  BILITOT 0.3 0.2  PROT 8.2 8.0  ALBUMIN 4.1 4.3    Recent Labs  08/27/14 1523 06/03/15 1418 06/26/15 1252 06/27/15 06/27/15 0530 07/02/15  WBC 8.6 10.9* 9.2 10.8 10.8* 11.2  NEUTROABS 4.6 7.2  --   --   --   --   HGB 13.8 13.4 14.0  --  13.3 13.4  HCT 40.5 39.8 42.6  --  39.1 40  MCV 91.5 93.4 91.8  --  90.1  --   PLT 234.0 231.0 227  --  214 261   Lab Results  Component Value Date  TSH 2.60 06/03/2015   Lab  Results  Component Value Date   HGBA1C 6.2 06/03/2015   Lab Results  Component Value Date   CHOL 171 12/27/2012   HDL 37.80* 12/27/2012   LDLCALC 101* 12/27/2012   TRIG 161.0* 12/27/2012   CHOLHDL 5 12/27/2012    Significant Diagnostic Results in last 30 days:  Dg Cervical Spine 2-3 Views  06/26/2015  CLINICAL DATA:  Imaging for surgical localization for cervical spine surgery. EXAM: CERVICAL SPINE - 2-3 VIEW COMPARISON:  Cervical MRI, 06/24/2015 FINDINGS: Initial image shows placement of needle with its tip posterior to the posterior margin of the C2-C3 disc. Subsequent image shows placement of surgical probe. The tip projects 2 cm posterior to the posterior inferior margin of the C3 vertebral body. IMPRESSION: Surgical localization imaging as described. Electronically Signed   By: Lajean Manes M.D.   On: 06/26/2015 16:59   Dg Cervical Spine 2-3 Views  06/16/2015  CLINICAL DATA:  Numbness in upper extremities.  No recent trauma EXAM: CERVICAL SPINE - 2-3 VIEW COMPARISON:  None. FINDINGS: Frontal, lateral, and open-mouth odontoid images obtained. There is no demonstrable fracture or spondylolisthesis. Prevertebral soft tissues and predental space regions are normal. There is marked disc space narrowing at C6-7 with moderately severe disc space narrowing at C5-6. There are anterior osteophytes at C4, C5, C6, and C7. No erosive change. Bones are osteoporotic. There is extensive calcification in each carotid artery. IMPRESSION: Multilevel arthropathy. No fracture or spondylolisthesis. Bones osteoporotic. Carotid artery calcification bilaterally. Electronically Signed   By: Lowella Grip III M.D.   On: 06/16/2015 11:30   Mr Cervical Spine Wo Contrast  06/24/2015  CLINICAL DATA:  Patient fell 1 year ago. Neck pain with upper and lower extremity weakness. Weak grip. EXAM: MRI CERVICAL SPINE WITHOUT CONTRAST TECHNIQUE: Multiplanar, multisequence MR imaging of the cervical spine was performed. No  intravenous contrast was administered. COMPARISON:  Cervical spine radiographs 06/16/2015. No prior cross-sectional imaging is available. FINDINGS: Alignment: Reversal of the normal cervical lordotic curve. Trace anterolisthesis C3-C4. Vertebrae: No worrisome osseous lesion. Cord: Severe cord compression at C3-4. Canal diameter 5 mm. Abnormal cord signal is present, indicating either cord edema, gliosis, or myelomalacia. Query sequelae of central cord syndrome. No blood products are evident. Severe facet arthropathy bilaterally, greater on the LEFT, with joint effusion and bone marrow edema. Posterior Fossa: No tonsillar herniation. Vertebral Arteries: BILATERAL patent.  RIGHT dominant. Paraspinal tissues: No visible masses.  Suspected thyromegaly. Disc levels: The individual disc spaces were examined as follows: C2-3:  Mild bulge.  No impingement. C3-4: Central disc extrusion. Posterior element hypertrophy affecting facets and ligamentum flavum. Trace anterolisthesis. Severe spinal stenosis compressing the cord and resulting in abnormal cord signal. Canal diameter 5 mm. BILATERAL C4 nerve root impingement is also noted. C4-5: Trace anterolisthesis. Mild bulge. BILATERAL facet arthropathy, worse on the RIGHT. RIGHT C5 nerve root impingement. C5-6: Central and rightward disc osteophyte complex. Severe right-sided foraminal narrowing, less so on the LEFT. BILATERAL RIGHT greater than LEFT C6 nerve root impingement. Mild stenosis, with slight cord flattening. Canal diameter 7-8 mm. C6-7: Severe disc space narrowing. Central protrusion. Moderate stenosis with ligamentum flavum hypertrophy. Cord flattening. Canal diameter 6-7 mm. BILATERAL C7 nerve root impingement. C7-T1: Central protrusion. Asymmetric ligamentum flavum hypertrophy on the LEFT related to facet disease. LEFT C8 nerve root impingement is possible. No cord compression. IMPRESSION: Severe cord compression at C3-4 related to central disc extrusion and  posterior element hypertrophy. Canal diameter 5 mm. Abnormal cord  signal is noted, which could represent a combination of acute and chronic injury. Given the history of previous fall, the abnormal cord signal could represent sequelae of central cord syndrome. Similar less severe changes at C4-5, C5-6, and C6-7 with varying degrees of spinal stenosis and neural impingement. Electronically Signed   By: Staci Righter M.D.   On: 06/24/2015 14:57    Assessment/Plan There are no diagnoses linked to this encounter.   Family/ staff Communication: ***  Labs/tests ordered:  ***

## 2015-07-03 NOTE — Progress Notes (Signed)
Patient ID: Diana [name], female   DOB: 04/13/1924, 82 y.o.   MRN: [id number]  Location:  [facility] Room Number: 808 Place of Service:  SNF [phone]) Provider:  Blanchie Serve, MD  Eliezer Lofts, MD  Patient Care Team: Jinny Sanders, MD as PCP - General  Extended Emergency Contact Information Primary Emergency Contact: Rider,Bruce Address: [address]          [place], [place] [address] Johnnette Litter of [place] Phone: [phone] Mobile Phone: [phone] Relation: Son  Code Status:  No Code  Goals of care: Advanced Directive information Advanced Directives 07/03/2015  Does patient have an advance directive? No  Would patient like information on creating an advanced directive? No - patient declined information     Chief Complaint  Patient presents with  . Acute Visit    HPI:  Pt is a 82 y.o. female seen today at [facility] for Leukocytosis.She is here for short term rehabilitation post hospital admission from 06/26/15- 06/27/15 with cervical spondylosis with myelopathy and C3-4 stenosis. .She underwent C3C4 laminectomy and decompression. She is seen in her room today. She states no acute issues just tired after working with physical Therapy.She states neck pain under control with current regimen. She denies any fever, chills, cough or sore throat. Facility Nurse states she tends to be confused at times.   Past Medical History  Diagnosis Date  . Other abnormal glucose   . Unspecified vitamin D deficiency   . Urinary tract infection, site not specified   . Persistent disorder of initiating or maintaining sleep   . Pain in joint, shoulder region   . Palpitations   . Bacterial pneumonia, unspecified   . Candidiasis of mouth   . Unspecified viral infection, in conditions classified elsewhere and of unspecified site   . Lumbago   . Unspecified essential hypertension   . Hyposmolality and/or hyponatremia   . Unspecified  hypothyroidism   . Cancer , breast   . Encephalitis 1961  . Chronic airway obstruction, not elsewhere classified     son not aware of this  . Obstructive chronic bronchitis with exacerbation ([place])   . Restless leg syndrome   . Arthritis   . Family history of adverse reaction to anesthesia     son has nausea  . Heart murmur     has had it for years; very mild AS 05/2015 echo   Past Surgical History  Procedure Laterality Date  . Abi  04/19/05    Negative  . Venous doppler  04/19/05    LE normal  . Back mri  2005    mild bulging disks  . Mastectomy, radical    . Eye surgery Bilateral     Cataract surgery   . Hemorrhoid surgery    . Posterior cervical laminectomy N/A 06/26/2015    Procedure: Cervical three-four POSTERIOR CERVICAL LAMINECTOMY for decompression ;  Surgeon: Kevan Ny Ditty, MD;  Location: [facility];  Service: Neurosurgery;  Laterality: N/A;  C34 laminectomy    Allergies  Allergen Reactions  . Meperidine Hcl Nausea And Vomiting      Medication List       This list is accurate as of: 07/03/15 12:39 PM.  Always use your most recent med list.               amLODipine 10 MG tablet  Commonly known as:  NORVASC  TAKE 1 TABLET BY MOUTH DAILY  bisacodyl 5 MG EC tablet  Commonly known as:  DULCOLAX  Take 1 tablet (5 mg total) by mouth daily as needed for moderate constipation.     cholecalciferol 1000 units tablet  Commonly known as:  VITAMIN D  Take 1,000 Units by mouth daily.     docusate sodium 100 MG capsule  Commonly known as:  COLACE  Take 1 capsule (100 mg total) by mouth 2 (two) times daily.     ergocalciferol 50000 units capsule  Commonly known as:  VITAMIN D2  Take 50,000 Units by mouth once a week.     furosemide 40 MG tablet  Commonly known as:  LASIX  TAKE 1 TABLET BY MOUTH DAILY     HYDROcodone-acetaminophen 5-325 MG tablet  Commonly known as:  NORCO/VICODIN  Take 1-2 tablets by mouth every 4 (four) hours as needed (mild pain).      hydrocortisone 2.5 % lotion  Apply 1 application topically daily as needed (for itching).     levothyroxine 100 MCG tablet  Commonly known as:  SYNTHROID, LEVOTHROID  TAKE 1 TABLET BY MOUTH DAILY     losartan 100 MG tablet  Commonly known as:  COZAAR  Take 100 mg by mouth daily.     meloxicam 15 MG tablet  Commonly known as:  MOBIC  Take 15 mg by mouth daily.     methocarbamol 500 MG tablet  Commonly known as:  ROBAXIN  Take 1 tablet (500 mg total) by mouth every 6 (six) hours as needed for muscle spasms.     metoprolol succinate 50 MG 24 hr tablet  Commonly known as:  TOPROL-XL  TAKE 1 TABLET BY MOUTH DAILY        Review of Systems  Constitutional: Negative for fever, chills, activity change and appetite change.  HENT: Negative for congestion, sinus pressure, sneezing and sore throat.   Eyes: Negative for discharge, redness and visual disturbance.  Respiratory: Negative for cough, chest tightness, shortness of breath and wheezing.   Cardiovascular: Negative for chest pain, palpitations and leg swelling.  Gastrointestinal: Negative.   Genitourinary: Negative for dysuria, urgency, frequency, hematuria, flank pain and difficulty urinating.  Musculoskeletal:       Neck pain surgical incision drsg  Skin: Negative.   Neurological: Negative for dizziness, seizures, light-headedness and headaches.  Hematological: Negative.   Psychiatric/Behavioral: Negative for suicidal ideas, hallucinations, confusion, sleep disturbance, decreased concentration and agitation. The patient is not nervous/anxious.     Immunization History  Administered Date(s) Administered  . Influenza Whole 01/18/2007, 02/15/2008, 01/28/2009, 01/27/2010, 01/27/2011  . Influenza,inj,Quad PF,36+ Mos 12/29/2012, 01/10/2014, 01/28/2015  . PPD Test 06/27/2015  . Pneumococcal Conjugate-13 01/10/2014  . Pneumococcal Polysaccharide-23 01/18/2007  . Td 02/22/2014  . Zoster 10/17/2009   Pertinent  Health  Maintenance Due  Topic Date Due  . DEXA SCAN  12/05/1990  . INFLUENZA VACCINE  11/18/2015  . PNA vac Low Risk Adult  Completed   Fall Risk  01/10/2014 12/29/2012 04/05/2012  Falls in the past year? Yes No No  Number falls in past yr: 1 - -  Injury with Fall? Yes - -  Risk for fall due to : Impaired mobility Impaired mobility -  Risk for fall due to (comments): Uses Cane with walking Use walking cane -   Functional Status Survey:    Filed Vitals:   07/03/15 1017  BP: 143/86  Pulse: 58  Temp: 98.6 F (37 C)  TempSrc: Oral  Resp: 17  Height: 5\' 4"  (1.626 m)  Weight: 175 lb (79.379 kg)  SpO2: 98%   Body mass index is 30.02 kg/(m^2). Physical Exam  Constitutional: She appears well-developed and well-nourished. No distress.  HENT:  Head: Normocephalic.  Mouth/Throat: Oropharynx is clear and moist.  Eyes: Conjunctivae and EOM are normal. Pupils are equal, round, and reactive to light. Right eye exhibits no discharge. Left eye exhibits no discharge. No scleral icterus.  Purple bruise periorbital.   Neck: Normal range of motion. No JVD present. No thyromegaly present.  Cardiovascular: Normal rate, regular rhythm, normal heart sounds and intact distal pulses.  Exam reveals no gallop and no friction rub.   No murmur heard. Pulmonary/Chest: Effort normal. No respiratory distress. She has no wheezes. She has no rales.  Bilateral diminished breath sounds to Auscultation.   Abdominal: Soft. Bowel sounds are normal. She exhibits no distension and no mass. There is no tenderness. There is no rebound and no guarding.  Musculoskeletal: She exhibits no edema or tenderness.  Normal ROM except neck limited due to pain.   Lymphadenopathy:    She has no cervical adenopathy.  Neurological: She is alert.  Skin: Skin is warm and dry. No rash noted. No erythema. No pallor.  Psychiatric: She has a normal mood and affect.    Labs reviewed:  Recent Labs  06/03/15 1418 06/26/15 1252 06/27/15  06/27/15 0530  NA 135 137 134* 134*  K 4.0 4.0  --  4.2  CL 98 101  --  98*  CO2 30 26  --  23  GLUCOSE 116* 101*  --  131*  BUN 15 15 11 11   CREATININE 0.92 0.86 0.8 0.76  CALCIUM 9.6 9.5  --  8.9    Recent Labs  08/27/14 1523 06/03/15 1418  AST 19 19  ALT 16 17  ALKPHOS 83 80  BILITOT 0.3 0.2  PROT 8.2 8.0  ALBUMIN 4.1 4.3    Recent Labs  08/27/14 1523 06/03/15 1418 06/26/15 1252 06/27/15 06/27/15 0530 07/02/15  WBC 8.6 10.9* 9.2 10.8 10.8* 11.2  NEUTROABS 4.6 7.2  --   --   --   --   HGB 13.8 13.4 14.0  --  13.3 13.4  HCT 40.5 39.8 42.6  --  39.1 40  MCV 91.5 93.4 91.8  --  90.1  --   PLT 234.0 231.0 227  --  214 261   Lab Results  Component Value Date   TSH 2.60 06/03/2015   Lab Results  Component Value Date   HGBA1C 6.2 06/03/2015   Lab Results  Component Value Date   CHOL 171 12/27/2012   HDL 37.80* 12/27/2012   LDLCALC 101* 12/27/2012   TRIG 161.0* 12/27/2012   CHOLHDL 5 12/27/2012    Significant Diagnostic Results in last 30 days:  Dg Cervical Spine 2-3 Views  06/26/2015  CLINICAL DATA:  Imaging for surgical localization for cervical spine surgery. EXAM: CERVICAL SPINE - 2-3 VIEW COMPARISON:  Cervical MRI, 06/24/2015 FINDINGS: Initial image shows placement of needle with its tip posterior to the posterior margin of the C2-C3 disc. Subsequent image shows placement of surgical probe. The tip projects 2 cm posterior to the posterior inferior margin of the C3 vertebral body. IMPRESSION: Surgical localization imaging as described. Electronically Signed   By: Lajean Manes M.D.   On: 06/26/2015 16:59   Dg Cervical Spine 2-3 Views  06/16/2015  CLINICAL DATA:  Numbness in upper extremities.  No recent trauma EXAM: CERVICAL SPINE - 2-3 VIEW COMPARISON:  None. FINDINGS: Frontal, lateral,  and open-mouth odontoid images obtained. There is no demonstrable fracture or spondylolisthesis. Prevertebral soft tissues and predental space regions are normal. There is  marked disc space narrowing at C6-7 with moderately severe disc space narrowing at C5-6. There are anterior osteophytes at C4, C5, C6, and C7. No erosive change. Bones are osteoporotic. There is extensive calcification in each carotid artery. IMPRESSION: Multilevel arthropathy. No fracture or spondylolisthesis. Bones osteoporotic. Carotid artery calcification bilaterally. Electronically Signed   By: Lowella Grip III M.D.   On: 06/16/2015 11:30   Mr Cervical Spine Wo Contrast  06/24/2015  CLINICAL DATA:  Patient fell 1 year ago. Neck pain with upper and lower extremity weakness. Weak grip. EXAM: MRI CERVICAL SPINE WITHOUT CONTRAST TECHNIQUE: Multiplanar, multisequence MR imaging of the cervical spine was performed. No intravenous contrast was administered. COMPARISON:  Cervical spine radiographs 06/16/2015. No prior cross-sectional imaging is available. FINDINGS: Alignment: Reversal of the normal cervical lordotic curve. Trace anterolisthesis C3-C4. Vertebrae: No worrisome osseous lesion. Cord: Severe cord compression at C3-4. Canal diameter 5 mm. Abnormal cord signal is present, indicating either cord edema, gliosis, or myelomalacia. Query sequelae of central cord syndrome. No blood products are evident. Severe facet arthropathy bilaterally, greater on the LEFT, with joint effusion and bone marrow edema. Posterior Fossa: No tonsillar herniation. Vertebral Arteries: BILATERAL patent.  RIGHT dominant. Paraspinal tissues: No visible masses.  Suspected thyromegaly. Disc levels: The individual disc spaces were examined as follows: C2-3:  Mild bulge.  No impingement. C3-4: Central disc extrusion. Posterior element hypertrophy affecting facets and ligamentum flavum. Trace anterolisthesis. Severe spinal stenosis compressing the cord and resulting in abnormal cord signal. Canal diameter 5 mm. BILATERAL C4 nerve root impingement is also noted. C4-5: Trace anterolisthesis. Mild bulge. BILATERAL facet arthropathy, worse  on the RIGHT. RIGHT C5 nerve root impingement. C5-6: Central and rightward disc osteophyte complex. Severe right-sided foraminal narrowing, less so on the LEFT. BILATERAL RIGHT greater than LEFT C6 nerve root impingement. Mild stenosis, with slight cord flattening. Canal diameter 7-8 mm. C6-7: Severe disc space narrowing. Central protrusion. Moderate stenosis with ligamentum flavum hypertrophy. Cord flattening. Canal diameter 6-7 mm. BILATERAL C7 nerve root impingement. C7-T1: Central protrusion. Asymmetric ligamentum flavum hypertrophy on the LEFT related to facet disease. LEFT C8 nerve root impingement is possible. No cord compression. IMPRESSION: Severe cord compression at C3-4 related to central disc extrusion and posterior element hypertrophy. Canal diameter 5 mm. Abnormal cord signal is noted, which could represent a combination of acute and chronic injury. Given the history of previous fall, the abnormal cord signal could represent sequelae of central cord syndrome. Similar less severe changes at C4-5, C5-6, and C6-7 with varying degrees of spinal stenosis and neural impingement. Electronically Signed   By: Staci Righter M.D.   On: 06/24/2015 14:57    Assessment/Plan Leukocytosis  Afebrile.Recent WBC 11.2 (07/02/2015) previous 10.8. Exam findings negative except bilateral lung diminished bases on auscultation. Ordering portable CXR Pa/Lat and Urine specimen for U/A and C/s. CBC/diff 07/07/2015  S/P C3C4 laminectomy and decompression. Afebrile.Incision site dry ,clean and intact. Surrounding tissues with any redness, tenderness or swelling.Continue dry DRSG QOD  Family/ staff Communication: Reviewed plan of care with patient and Facility Nurse.   Labs/tests ordered:  portable CXR Pa/Lat and Urine specimen for U/A and C/s. CBC/diff 07/07/2015

## 2015-07-07 LAB — CBC AND DIFFERENTIAL
HCT: 44 % (ref 36–46)
Hemoglobin: 14.5 g/dL (ref 12.0–16.0)
Platelets: 288 10*3/uL (ref 150–399)
WBC: 11.1 10*3/mL

## 2015-07-08 ENCOUNTER — Other Ambulatory Visit: Payer: Medicare Other

## 2015-07-08 ENCOUNTER — Ambulatory Visit: Payer: Medicare Other | Admitting: Cardiology

## 2015-07-09 ENCOUNTER — Encounter: Payer: Self-pay | Admitting: Family

## 2015-07-09 ENCOUNTER — Non-Acute Institutional Stay (SKILLED_NURSING_FACILITY): Payer: Medicare Other | Admitting: Family

## 2015-07-09 DIAGNOSIS — Z9889 Other specified postprocedural states: Secondary | ICD-10-CM | POA: Diagnosis not present

## 2015-07-09 DIAGNOSIS — D72829 Elevated white blood cell count, unspecified: Secondary | ICD-10-CM

## 2015-07-09 NOTE — Progress Notes (Signed)
Location:  [facility] Room Number: 808 Place of Service:  SNF ([phone]) Provider:Mahima Bubba Camp, MD   Eliezer Lofts, MD  Patient Care Team: Jinny Sanders, MD as PCP - General  Extended Emergency Contact Information Primary Emergency Contact: Kingsberry,Bruce Address: [address]          [place], [place] [address] Johnnette Litter of [place] Phone: [phone] Mobile Phone: [phone] Relation: Son  Code Status:  DNR  Goals of care: Advanced Directive information Advanced Directives 07/03/2015  Does patient have an advance directive? No  Would patient like information on creating an advanced directive? No - patient declined information     Chief Complaint  Patient presents with  . Acute Visit    Leukocytosis     HPI:  Pt is a 82 y.o. female seen today [facility]  for acute visit for leukocytosis. She has a medical history of HTN, COPD, Hypothyroidism among others. She is S/p [facility] admission 06/26/2015-06/27/2015  C3-C4 laminectomy for decompression.She is seen in her room today. She has worked well with PT/OT.She denies any fever, chills, cough or shortness of breath. She reports having frequent voiding. Facility staff reports no new concerns.    Past Medical History  Diagnosis Date  . Other abnormal glucose   . Unspecified vitamin D deficiency   . Urinary tract infection, site not specified   . Persistent disorder of initiating or maintaining sleep   . Pain in joint, shoulder region   . Palpitations   . Bacterial pneumonia, unspecified   . Candidiasis of mouth   . Unspecified viral infection, in conditions classified elsewhere and of unspecified site   . Lumbago   . Unspecified essential hypertension   . Hyposmolality and/or hyponatremia   . Unspecified hypothyroidism   . Cancer , breast   . Encephalitis 1961  . Chronic airway obstruction, not elsewhere classified     son not aware of this  . Obstructive chronic  bronchitis with exacerbation ([place])   . Restless leg syndrome   . Arthritis   . Family history of adverse reaction to anesthesia     son has nausea  . Heart murmur     has had it for years; very mild AS 05/2015 echo   Past Surgical History  Procedure Laterality Date  . Abi  04/19/05    Negative  . Venous doppler  04/19/05    LE normal  . Back mri  2005    mild bulging disks  . Mastectomy, radical    . Eye surgery Bilateral     Cataract surgery   . Hemorrhoid surgery    . Posterior cervical laminectomy N/A 06/26/2015    Procedure: Cervical three-four POSTERIOR CERVICAL LAMINECTOMY for decompression ;  Surgeon: Kevan Ny Ditty, MD;  Location: [facility];  Service: Neurosurgery;  Laterality: N/A;  C34 laminectomy    Allergies  Allergen Reactions  . Meperidine Hcl Nausea And Vomiting      Medication List       This list is accurate as of: 07/09/15 12:30 PM.  Always use your most recent med list.               amLODipine 10 MG tablet  Commonly known as:  NORVASC  TAKE 1 TABLET BY MOUTH DAILY     bisacodyl 5 MG EC tablet  Commonly known as:  DULCOLAX  Take 1 tablet (5 mg total) by mouth daily as needed for moderate  constipation.     cholecalciferol 1000 units tablet  Commonly known as:  VITAMIN D  Take 1,000 Units by mouth daily.     docusate sodium 100 MG capsule  Commonly known as:  COLACE  Take 1 capsule (100 mg total) by mouth 2 (two) times daily.     ergocalciferol 50000 units capsule  Commonly known as:  VITAMIN D2  Take 50,000 Units by mouth once a week.     furosemide 40 MG tablet  Commonly known as:  LASIX  TAKE 1 TABLET BY MOUTH DAILY     HYDROcodone-acetaminophen 5-325 MG tablet  Commonly known as:  NORCO/VICODIN  Take 1-2 tablets by mouth every 4 (four) hours as needed (mild pain).     hydrocortisone 2.5 % lotion  Apply 1 application topically daily as needed (for itching).     levothyroxine 100 MCG tablet  Commonly known as:  SYNTHROID,  LEVOTHROID  TAKE 1 TABLET BY MOUTH DAILY     losartan 100 MG tablet  Commonly known as:  COZAAR  Take 100 mg by mouth daily.     meloxicam 15 MG tablet  Commonly known as:  MOBIC  Take 15 mg by mouth daily.     methocarbamol 500 MG tablet  Commonly known as:  ROBAXIN  Take 1 tablet (500 mg total) by mouth every 6 (six) hours as needed for muscle spasms.     metoprolol succinate 50 MG 24 hr tablet  Commonly known as:  TOPROL-XL  TAKE 1 TABLET BY MOUTH DAILY        Review of Systems  Constitutional: Negative for fever, chills, activity change, appetite change and fatigue.  HENT: Negative.   Eyes: Negative.   Respiratory: Negative for cough, chest tightness, shortness of breath and wheezing.   Cardiovascular: Negative.   Gastrointestinal: Negative.   Genitourinary: Positive for urgency and frequency. Negative for dysuria, hematuria, flank pain, decreased urine volume and difficulty urinating.  Skin: Negative.   Hematological: Negative.   Psychiatric/Behavioral: Negative for hallucinations, confusion and agitation.    Immunization History  Administered Date(s) Administered  . Influenza Whole 01/18/2007, 02/15/2008, 01/28/2009, 01/27/2010, 01/27/2011  . Influenza,inj,Quad PF,36+ Mos 12/29/2012, 01/10/2014, 01/28/2015  . PPD Test 06/27/2015  . Pneumococcal Conjugate-13 01/10/2014  . Pneumococcal Polysaccharide-23 01/18/2007  . Td 02/22/2014  . Zoster 10/17/2009   Pertinent  Health Maintenance Due  Topic Date Due  . DEXA SCAN  12/05/1990  . INFLUENZA VACCINE  11/18/2015  . PNA vac Low Risk Adult  Completed   Fall Risk  01/10/2014 12/29/2012 04/05/2012  Falls in the past year? Yes No No  Number falls in past yr: 1 - -  Injury with Fall? Yes - -  Risk for fall due to : Impaired mobility Impaired mobility -  Risk for fall due to (comments): Uses Cane with walking Use walking cane -   Functional Status Survey:    Filed Vitals:   07/09/15 1016  BP: 144/68  Pulse: 56    Temp: 98 F (36.7 C)  TempSrc: Oral  Resp: 18  Height: 5\' 4"  (1.626 m)  Weight: 175 lb (79.379 kg)  SpO2: 95%   Body mass index is 30.02 kg/(m^2). Physical Exam  Constitutional: She is oriented to person, place, and time. She appears well-developed and well-nourished. No distress.  HENT:  Head: Normocephalic.  Right Ear: External ear normal.  Left Ear: External ear normal.  Mouth/Throat: Oropharynx is clear and moist. No oropharyngeal exudate.  Eyes: Conjunctivae and EOM are normal.  Pupils are equal, round, and reactive to light. Right eye exhibits no discharge. Left eye exhibits no discharge. No scleral icterus.  Neck: Normal range of motion. No JVD present.  Cardiovascular: Normal rate, regular rhythm, normal heart sounds and intact distal pulses.  Exam reveals no gallop and no friction rub.   No murmur heard. Pulmonary/Chest: Effort normal and breath sounds normal. No respiratory distress. She has no wheezes. She has no rales.  Abdominal: Soft. Bowel sounds are normal. She exhibits no distension and no mass. There is no tenderness. There is no rebound and no guarding.  Musculoskeletal: She exhibits no edema or tenderness.  Neck surgical incision drsg dry and clean. Surrounding skin without any redness, swelling or drainage.   Lymphadenopathy:    She has no cervical adenopathy.  Neurological: She is oriented to person, place, and time.  Skin: Skin is warm and dry. No rash noted. No erythema. No pallor.  Psychiatric: She has a normal mood and affect.    Labs reviewed:  Recent Labs  06/03/15 1418 06/26/15 1252 06/27/15 06/27/15 0530 07/02/15  NA 135 137 134* 134* 136*  K 4.0 4.0  --  4.2 3.8  CL 98 101  --  98*  --   CO2 30 26  --  23  --   GLUCOSE 116* 101*  --  131*  --   BUN 15 15 11 11 16   CREATININE 0.92 0.86 0.8 0.76 0.9  CALCIUM 9.6 9.5  --  8.9  --     Recent Labs  08/27/14 1523 06/03/15 1418  AST 19 19  ALT 16 17  ALKPHOS 83 80  BILITOT 0.3 0.2  PROT  8.2 8.0  ALBUMIN 4.1 4.3    Recent Labs  08/27/14 1523 06/03/15 1418 06/26/15 1252  06/27/15 0530 07/02/15 07/07/15  WBC 8.6 10.9* 9.2  < > 10.8* 11.2 11.1  NEUTROABS 4.6 7.2  --   --   --   --   --   HGB 13.8 13.4 14.0  --  13.3 13.4 14.5  HCT 40.5 39.8 42.6  --  39.1 40 44  MCV 91.5 93.4 91.8  --  90.1  --   --   PLT 234.0 231.0 227  --  214 261 288  < > = values in this interval not displayed. Lab Results  Component Value Date   TSH 2.60 06/03/2015   Lab Results  Component Value Date   HGBA1C 6.2 06/03/2015   Lab Results  Component Value Date   CHOL 171 12/27/2012   HDL 37.80* 12/27/2012   LDLCALC 101* 12/27/2012   TRIG 161.0* 12/27/2012   CHOLHDL 5 12/27/2012    Significant Diagnostic Results in last 30 days:  Dg Cervical Spine 2-3 Views  06/26/2015  CLINICAL DATA:  Imaging for surgical localization for cervical spine surgery. EXAM: CERVICAL SPINE - 2-3 VIEW COMPARISON:  Cervical MRI, 06/24/2015 FINDINGS: Initial image shows placement of needle with its tip posterior to the posterior margin of the C2-C3 disc. Subsequent image shows placement of surgical probe. The tip projects 2 cm posterior to the posterior inferior margin of the C3 vertebral body. IMPRESSION: Surgical localization imaging as described. Electronically Signed   By: Lajean Manes M.D.   On: 06/26/2015 16:59   Dg Cervical Spine 2-3 Views  06/16/2015  CLINICAL DATA:  Numbness in upper extremities.  No recent trauma EXAM: CERVICAL SPINE - 2-3 VIEW COMPARISON:  None. FINDINGS: Frontal, lateral, and open-mouth odontoid images obtained. There is no  demonstrable fracture or spondylolisthesis. Prevertebral soft tissues and predental space regions are normal. There is marked disc space narrowing at C6-7 with moderately severe disc space narrowing at C5-6. There are anterior osteophytes at C4, C5, C6, and C7. No erosive change. Bones are osteoporotic. There is extensive calcification in each carotid artery. IMPRESSION:  Multilevel arthropathy. No fracture or spondylolisthesis. Bones osteoporotic. Carotid artery calcification bilaterally. Electronically Signed   By: Lowella Grip III M.D.   On: 06/16/2015 11:30   Mr Cervical Spine Wo Contrast  06/24/2015  CLINICAL DATA:  Patient fell 1 year ago. Neck pain with upper and lower extremity weakness. Weak grip. EXAM: MRI CERVICAL SPINE WITHOUT CONTRAST TECHNIQUE: Multiplanar, multisequence MR imaging of the cervical spine was performed. No intravenous contrast was administered. COMPARISON:  Cervical spine radiographs 06/16/2015. No prior cross-sectional imaging is available. FINDINGS: Alignment: Reversal of the normal cervical lordotic curve. Trace anterolisthesis C3-C4. Vertebrae: No worrisome osseous lesion. Cord: Severe cord compression at C3-4. Canal diameter 5 mm. Abnormal cord signal is present, indicating either cord edema, gliosis, or myelomalacia. Query sequelae of central cord syndrome. No blood products are evident. Severe facet arthropathy bilaterally, greater on the LEFT, with joint effusion and bone marrow edema. Posterior Fossa: No tonsillar herniation. Vertebral Arteries: BILATERAL patent.  RIGHT dominant. Paraspinal tissues: No visible masses.  Suspected thyromegaly. Disc levels: The individual disc spaces were examined as follows: C2-3:  Mild bulge.  No impingement. C3-4: Central disc extrusion. Posterior element hypertrophy affecting facets and ligamentum flavum. Trace anterolisthesis. Severe spinal stenosis compressing the cord and resulting in abnormal cord signal. Canal diameter 5 mm. BILATERAL C4 nerve root impingement is also noted. C4-5: Trace anterolisthesis. Mild bulge. BILATERAL facet arthropathy, worse on the RIGHT. RIGHT C5 nerve root impingement. C5-6: Central and rightward disc osteophyte complex. Severe right-sided foraminal narrowing, less so on the LEFT. BILATERAL RIGHT greater than LEFT C6 nerve root impingement. Mild stenosis, with slight cord  flattening. Canal diameter 7-8 mm. C6-7: Severe disc space narrowing. Central protrusion. Moderate stenosis with ligamentum flavum hypertrophy. Cord flattening. Canal diameter 6-7 mm. BILATERAL C7 nerve root impingement. C7-T1: Central protrusion. Asymmetric ligamentum flavum hypertrophy on the LEFT related to facet disease. LEFT C8 nerve root impingement is possible. No cord compression. IMPRESSION: Severe cord compression at C3-4 related to central disc extrusion and posterior element hypertrophy. Canal diameter 5 mm. Abnormal cord signal is noted, which could represent a combination of acute and chronic injury. Given the history of previous fall, the abnormal cord signal could represent sequelae of central cord syndrome. Similar less severe changes at C4-5, C5-6, and C6-7 with varying degrees of spinal stenosis and neural impingement. Electronically Signed   By: Staci Righter M.D.   On: 06/24/2015 14:57    Assessment/Plan Leukocytosis  Recent WBC 11.1 ( 07/07/2015). Afebrile. Neck incision site exam findings negative. Bilateral Lungs CTA. Reports urine frequency and urgency. Will obtain Urine specimen for U/A and C/S.   S/p C3-C4 laminectomy decompression Incision site dry clean and intact.    Family/ staff Communication: Reviewed plan with patient and Patient's Nurse.   Labs/tests ordered:  Urine specimen for U/A and C/S

## 2015-07-09 NOTE — Progress Notes (Deleted)
Location:  [facility] Room Number: 808 Place of Service:  SNF [phone]) Provider:  ***  Eliezer Lofts, MD  Patient Care Team: Jinny Sanders, MD as PCP - General  Extended Emergency Contact Information Primary Emergency Contact: Alewine,Bruce Address: [address]          [place], [place] [address] Johnnette Litter of [place] Phone: [phone] Mobile Phone: [phone] Relation: Son  Code Status:  *** Goals of care: Advanced Directive information Advanced Directives 07/03/2015  Does patient have an advance directive? No  Would patient like information on creating an advanced directive? No - patient declined information     Chief Complaint  Patient presents with  . Acute Visit    HPI:  Pt is a 82 y.o. female seen today for an acute visit for    Past Medical History  Diagnosis Date  . Other abnormal glucose   . Unspecified vitamin D deficiency   . Urinary tract infection, site not specified   . Persistent disorder of initiating or maintaining sleep   . Pain in joint, shoulder region   . Palpitations   . Bacterial pneumonia, unspecified   . Candidiasis of mouth   . Unspecified viral infection, in conditions classified elsewhere and of unspecified site   . Lumbago   . Unspecified essential hypertension   . Hyposmolality and/or hyponatremia   . Unspecified hypothyroidism   . Cancer , breast   . Encephalitis 1961  . Chronic airway obstruction, not elsewhere classified     son not aware of this  . Obstructive chronic bronchitis with exacerbation ([place])   . Restless leg syndrome   . Arthritis   . Family history of adverse reaction to anesthesia     son has nausea  . Heart murmur     has had it for years; very mild AS 05/2015 echo   Past Surgical History  Procedure Laterality Date  . Abi  04/19/05    Negative  . Venous doppler  04/19/05    LE normal  . Back mri  2005    mild bulging disks  . Mastectomy, radical    . Eye surgery  Bilateral     Cataract surgery   . Hemorrhoid surgery    . Posterior cervical laminectomy N/A 06/26/2015    Procedure: Cervical three-four POSTERIOR CERVICAL LAMINECTOMY for decompression ;  Surgeon: Kevan Ny Ditty, MD;  Location: [facility];  Service: Neurosurgery;  Laterality: N/A;  C34 laminectomy    Allergies  Allergen Reactions  . Meperidine Hcl Nausea And Vomiting      Medication List       This list is accurate as of: 07/09/15 10:34 AM.  Always use your most recent med list.               amLODipine 10 MG tablet  Commonly known as:  NORVASC  TAKE 1 TABLET BY MOUTH DAILY     bisacodyl 5 MG EC tablet  Commonly known as:  DULCOLAX  Take 1 tablet (5 mg total) by mouth daily as needed for moderate constipation.     cholecalciferol 1000 units tablet  Commonly known as:  VITAMIN D  Take 1,000 Units by mouth daily.     docusate sodium 100 MG capsule  Commonly known as:  COLACE  Take 1 capsule (100 mg total) by mouth 2 (two) times daily.     ergocalciferol 50000 units capsule  Commonly known as:  VITAMIN D2  Take 50,000 Units by mouth once a week.     furosemide 40 MG tablet  Commonly known as:  LASIX  TAKE 1 TABLET BY MOUTH DAILY     HYDROcodone-acetaminophen 5-325 MG tablet  Commonly known as:  NORCO/VICODIN  Take 1-2 tablets by mouth every 4 (four) hours as needed (mild pain).     hydrocortisone 2.5 % lotion  Apply 1 application topically daily as needed (for itching).     levothyroxine 100 MCG tablet  Commonly known as:  SYNTHROID, LEVOTHROID  TAKE 1 TABLET BY MOUTH DAILY     losartan 100 MG tablet  Commonly known as:  COZAAR  Take 100 mg by mouth daily.     meloxicam 15 MG tablet  Commonly known as:  MOBIC  Take 15 mg by mouth daily.     methocarbamol 500 MG tablet  Commonly known as:  ROBAXIN  Take 1 tablet (500 mg total) by mouth every 6 (six) hours as needed for muscle spasms.     metoprolol succinate 50 MG 24 hr tablet  Commonly known  as:  TOPROL-XL  TAKE 1 TABLET BY MOUTH DAILY        Review of Systems  Immunization History  Administered Date(s) Administered  . Influenza Whole 01/18/2007, 02/15/2008, 01/28/2009, 01/27/2010, 01/27/2011  . Influenza,inj,Quad PF,36+ Mos 12/29/2012, 01/10/2014, 01/28/2015  . PPD Test 06/27/2015  . Pneumococcal Conjugate-13 01/10/2014  . Pneumococcal Polysaccharide-23 01/18/2007  . Td 02/22/2014  . Zoster 10/17/2009   Pertinent  Health Maintenance Due  Topic Date Due  . DEXA SCAN  12/05/1990  . INFLUENZA VACCINE  11/18/2015  . PNA vac Low Risk Adult  Completed   Fall Risk  01/10/2014 12/29/2012 04/05/2012  Falls in the past year? Yes No No  Number falls in past yr: 1 - -  Injury with Fall? Yes - -  Risk for fall due to : Impaired mobility Impaired mobility -  Risk for fall due to (comments): Uses Cane with walking Use walking cane -   Functional Status Survey:    Filed Vitals:   07/09/15 1016  BP: 144/68  Pulse: 56  Temp: 98 F (36.7 C)  TempSrc: Oral  Resp: 18  Height: 5\' 4"  (1.626 m)  Weight: 175 lb (79.379 kg)  SpO2: 95%   Body mass index is 30.02 kg/(m^2). Physical Exam  Labs reviewed:  Recent Labs  06/03/15 1418 06/26/15 1252 06/27/15 06/27/15 0530 07/02/15  NA 135 137 134* 134* 136*  K 4.0 4.0  --  4.2 3.8  CL 98 101  --  98*  --   CO2 30 26  --  23  --   GLUCOSE 116* 101*  --  131*  --   BUN 15 15 11 11 16   CREATININE 0.92 0.86 0.8 0.76 0.9  CALCIUM 9.6 9.5  --  8.9  --     Recent Labs  08/27/14 1523 06/03/15 1418  AST 19 19  ALT 16 17  ALKPHOS 83 80  BILITOT 0.3 0.2  PROT 8.2 8.0  ALBUMIN 4.1 4.3    Recent Labs  08/27/14 1523 06/03/15 1418 06/26/15 1252 06/27/15 06/27/15 0530 07/02/15  WBC 8.6 10.9* 9.2 10.8 10.8* 11.2  NEUTROABS 4.6 7.2  --   --   --   --   HGB 13.8 13.4 14.0  --  13.3 13.4  HCT 40.5 39.8 42.6  --  39.1 40  MCV 91.5 93.4 91.8  --  90.1  --   PLT 234.0  231.0 227  --  214 261   Lab Results  Component  Value Date   TSH 2.60 06/03/2015   Lab Results  Component Value Date   HGBA1C 6.2 06/03/2015   Lab Results  Component Value Date   CHOL 171 12/27/2012   HDL 37.80* 12/27/2012   LDLCALC 101* 12/27/2012   TRIG 161.0* 12/27/2012   CHOLHDL 5 12/27/2012    Significant Diagnostic Results in last 30 days:  Dg Cervical Spine 2-3 Views  06/26/2015  CLINICAL DATA:  Imaging for surgical localization for cervical spine surgery. EXAM: CERVICAL SPINE - 2-3 VIEW COMPARISON:  Cervical MRI, 06/24/2015 FINDINGS: Initial image shows placement of needle with its tip posterior to the posterior margin of the C2-C3 disc. Subsequent image shows placement of surgical probe. The tip projects 2 cm posterior to the posterior inferior margin of the C3 vertebral body. IMPRESSION: Surgical localization imaging as described. Electronically Signed   By: Lajean Manes M.D.   On: 06/26/2015 16:59   Dg Cervical Spine 2-3 Views  06/16/2015  CLINICAL DATA:  Numbness in upper extremities.  No recent trauma EXAM: CERVICAL SPINE - 2-3 VIEW COMPARISON:  None. FINDINGS: Frontal, lateral, and open-mouth odontoid images obtained. There is no demonstrable fracture or spondylolisthesis. Prevertebral soft tissues and predental space regions are normal. There is marked disc space narrowing at C6-7 with moderately severe disc space narrowing at C5-6. There are anterior osteophytes at C4, C5, C6, and C7. No erosive change. Bones are osteoporotic. There is extensive calcification in each carotid artery. IMPRESSION: Multilevel arthropathy. No fracture or spondylolisthesis. Bones osteoporotic. Carotid artery calcification bilaterally. Electronically Signed   By: Lowella Grip III M.D.   On: 06/16/2015 11:30   Mr Cervical Spine Wo Contrast  06/24/2015  CLINICAL DATA:  Patient fell 1 year ago. Neck pain with upper and lower extremity weakness. Weak grip. EXAM: MRI CERVICAL SPINE WITHOUT CONTRAST TECHNIQUE: Multiplanar, multisequence MR imaging  of the cervical spine was performed. No intravenous contrast was administered. COMPARISON:  Cervical spine radiographs 06/16/2015. No prior cross-sectional imaging is available. FINDINGS: Alignment: Reversal of the normal cervical lordotic curve. Trace anterolisthesis C3-C4. Vertebrae: No worrisome osseous lesion. Cord: Severe cord compression at C3-4. Canal diameter 5 mm. Abnormal cord signal is present, indicating either cord edema, gliosis, or myelomalacia. Query sequelae of central cord syndrome. No blood products are evident. Severe facet arthropathy bilaterally, greater on the LEFT, with joint effusion and bone marrow edema. Posterior Fossa: No tonsillar herniation. Vertebral Arteries: BILATERAL patent.  RIGHT dominant. Paraspinal tissues: No visible masses.  Suspected thyromegaly. Disc levels: The individual disc spaces were examined as follows: C2-3:  Mild bulge.  No impingement. C3-4: Central disc extrusion. Posterior element hypertrophy affecting facets and ligamentum flavum. Trace anterolisthesis. Severe spinal stenosis compressing the cord and resulting in abnormal cord signal. Canal diameter 5 mm. BILATERAL C4 nerve root impingement is also noted. C4-5: Trace anterolisthesis. Mild bulge. BILATERAL facet arthropathy, worse on the RIGHT. RIGHT C5 nerve root impingement. C5-6: Central and rightward disc osteophyte complex. Severe right-sided foraminal narrowing, less so on the LEFT. BILATERAL RIGHT greater than LEFT C6 nerve root impingement. Mild stenosis, with slight cord flattening. Canal diameter 7-8 mm. C6-7: Severe disc space narrowing. Central protrusion. Moderate stenosis with ligamentum flavum hypertrophy. Cord flattening. Canal diameter 6-7 mm. BILATERAL C7 nerve root impingement. C7-T1: Central protrusion. Asymmetric ligamentum flavum hypertrophy on the LEFT related to facet disease. LEFT C8 nerve root impingement is possible. No cord compression. IMPRESSION: Severe cord compression at  C3-4  related to central disc extrusion and posterior element hypertrophy. Canal diameter 5 mm. Abnormal cord signal is noted, which could represent a combination of acute and chronic injury. Given the history of previous fall, the abnormal cord signal could represent sequelae of central cord syndrome. Similar less severe changes at C4-5, C5-6, and C6-7 with varying degrees of spinal stenosis and neural impingement. Electronically Signed   By: Staci Righter M.D.   On: 06/24/2015 14:57    Assessment/Plan There are no diagnoses linked to this encounter.   Family/ staff Communication: ***  Labs/tests ordered:  ***

## 2015-07-14 ENCOUNTER — Non-Acute Institutional Stay (SKILLED_NURSING_FACILITY): Payer: Medicare Other | Admitting: Family

## 2015-07-14 DIAGNOSIS — R058 Other specified cough: Secondary | ICD-10-CM

## 2015-07-14 DIAGNOSIS — R05 Cough: Secondary | ICD-10-CM

## 2015-07-14 DIAGNOSIS — N3001 Acute cystitis with hematuria: Secondary | ICD-10-CM

## 2015-07-14 MED ORDER — SACCHAROMYCES BOULARDII 250 MG PO CAPS: 250.0000 mg | ORAL_CAPSULE | Freq: Two times a day (BID) | ORAL | Status: DC

## 2015-07-14 MED ORDER — NITROFURANTOIN MONOHYD MACRO 100 MG PO CAPS: 100.0000 mg | ORAL_CAPSULE | Freq: Two times a day (BID) | ORAL | Status: DC

## 2015-07-14 NOTE — Progress Notes (Signed)
Patient ID: Diana [name], female   DOB: [birth year], 82 y.o.   MRN: [id number]  Location:  [facility] Room Number: 808 Place of Service:  SNF [phone]) Provider:  Blanchie Serve, MD  Eliezer Lofts, MD  Patient Care Team: Jinny Sanders, MD as PCP - General  Extended Emergency Contact Information Primary Emergency Contact: Vines,Bruce Address: [address]          [place], [place] [address] Johnnette Litter of [place] Phone: [phone] Mobile Phone: [phone] Relation: Son  Code Status:  DNR  Goals of care: Advanced Directive information Advanced Directives 07/14/2015  Does patient have an advance directive? Yes  Type of Advance Directive (No Data)  Would patient like information on creating an advanced directive? -     Chief Complaint  Patient presents with  . Acute Visit    Cold Symptoms    HPI:  Pt is a 82 y.o. female seen today  At [facility] for an acute visit for cold symptoms and non-productive cough. She is seen in her room today per facility Nurse who reports patient complaining of cold symptoms with intermittent non-productive dry cough.She was treated with Robitussin DM 10 Mls  every 6 hrs for two days over the weekend.She states relief especially at night. She denies fever, chills wheezing or shortness of breath. She states no change in appetite. Recent urine specimen 07/10/2015 positive large leukocytes, blood and urine culture showed  citrobcter freundii and Morganella morganii.    Past Medical History  Diagnosis Date  . Other abnormal glucose   . Unspecified vitamin D deficiency   . Urinary tract infection, site not specified   . Persistent disorder of initiating or maintaining sleep   . Pain in joint, shoulder region   . Palpitations   . Bacterial pneumonia, unspecified   . Candidiasis of mouth   . Unspecified viral infection, in conditions classified elsewhere and of unspecified site   . Lumbago   .  Unspecified essential hypertension   . Hyposmolality and/or hyponatremia   . Unspecified hypothyroidism   . Cancer , breast   . Encephalitis 1961  . Chronic airway obstruction, not elsewhere classified     son not aware of this  . Obstructive chronic bronchitis with exacerbation ([place])   . Restless leg syndrome   . Arthritis   . Family history of adverse reaction to anesthesia     son has nausea  . Heart murmur     has had it for years; very mild AS 05/2015 echo   Past Surgical History  Procedure Laterality Date  . Abi  04/19/05    Negative  . Venous doppler  04/19/05    LE normal  . Back mri  2005    mild bulging disks  . Mastectomy, radical    . Eye surgery Bilateral     Cataract surgery   . Hemorrhoid surgery    . Posterior cervical laminectomy N/A 06/26/2015    Procedure: Cervical three-four POSTERIOR CERVICAL LAMINECTOMY for decompression ;  Surgeon: Kevan Ny Ditty, MD;  Location: [facility];  Service: Neurosurgery;  Laterality: N/A;  C34 laminectomy    Allergies  Allergen Reactions  . Meperidine Hcl Nausea And Vomiting      Medication List       This list is accurate as of: 07/14/15  2:59 PM.  Always use your most recent med list.  amLODipine 10 MG tablet  Commonly known as:  NORVASC  TAKE 1 TABLET BY MOUTH DAILY     bisacodyl 5 MG EC tablet  Commonly known as:  DULCOLAX  Take 1 tablet (5 mg total) by mouth daily as needed for moderate constipation.     cholecalciferol 1000 units tablet  Commonly known as:  VITAMIN D  Take 1,000 Units by mouth daily.     docusate sodium 100 MG capsule  Commonly known as:  COLACE  Take 1 capsule (100 mg total) by mouth 2 (two) times daily.     ergocalciferol 50000 units capsule  Commonly known as:  VITAMIN D2  Take 50,000 Units by mouth once a week.     furosemide 40 MG tablet  Commonly known as:  LASIX  TAKE 1 TABLET BY MOUTH DAILY     HYDROcodone-acetaminophen 5-325 MG tablet  Commonly known  as:  NORCO/VICODIN  Take 1-2 tablets by mouth every 4 (four) hours as needed (mild pain).     hydrocortisone 2.5 % lotion  Apply 1 application topically daily as needed (for itching).     levothyroxine 100 MCG tablet  Commonly known as:  SYNTHROID, LEVOTHROID  TAKE 1 TABLET BY MOUTH DAILY     losartan 100 MG tablet  Commonly known as:  COZAAR  Take 100 mg by mouth daily.     meloxicam 15 MG tablet  Commonly known as:  MOBIC  Take 15 mg by mouth daily.     methocarbamol 500 MG tablet  Commonly known as:  ROBAXIN  Take 1 tablet (500 mg total) by mouth every 6 (six) hours as needed for muscle spasms.     metoprolol succinate 50 MG 24 hr tablet  Commonly known as:  TOPROL-XL  TAKE 1 TABLET BY MOUTH DAILY     ROBITUSSIN DM PO  Take 28ml by mouth every 6 hours as needed for cough        Review of Systems  Constitutional: Negative for fever, chills, activity change and fatigue.  HENT: Negative for congestion, rhinorrhea, sinus pressure and sore throat.   Eyes: Negative.   Respiratory: Positive for cough. Negative for chest tightness, shortness of breath and wheezing.   Cardiovascular: Negative for chest pain, palpitations and leg swelling.  Gastrointestinal: Negative for nausea, vomiting, abdominal pain, diarrhea, constipation and abdominal distention.  Genitourinary: Positive for frequency. Negative for dysuria and flank pain.  Skin: Negative.   Neurological: Negative.   Psychiatric/Behavioral: Negative.     Immunization History  Administered Date(s) Administered  . Influenza Whole 01/18/2007, 02/15/2008, 01/28/2009, 01/27/2010, 01/27/2011  . Influenza,inj,Quad PF,36+ Mos 12/29/2012, 01/10/2014, 01/28/2015  . PPD Test 06/27/2015  . Pneumococcal Conjugate-13 01/10/2014  . Pneumococcal Polysaccharide-23 01/18/2007  . Td 02/22/2014  . Zoster 10/17/2009   Pertinent  Health Maintenance Due  Topic Date Due  . DEXA SCAN  12/05/1990  . INFLUENZA VACCINE  11/18/2015  . PNA  vac Low Risk Adult  Completed   Fall Risk  01/10/2014 12/29/2012 04/05/2012  Falls in the past year? Yes No No  Number falls in past yr: 1 - -  Injury with Fall? Yes - -  Risk for fall due to : Impaired mobility Impaired mobility -  Risk for fall due to (comments): Uses Cane with walking Use walking cane -   Functional Status Survey:    Filed Vitals:   07/14/15 1139  BP: 125/69  Pulse: 67  Temp: 98.4 F (36.9 C)  TempSrc: Oral  Resp: 18  Height: 5\' 4"  (1.626 m)  Weight: 175 lb (79.379 kg)  SpO2: 97%   Body mass index is 30.02 kg/(m^2). Physical Exam  Constitutional: She is oriented to person, place, and time. She appears well-developed and well-nourished. No distress.  HENT:  Head: Normocephalic.  Right Ear: External ear normal.  Left Ear: External ear normal.  Mouth/Throat: Oropharynx is clear and moist. No oropharyngeal exudate.  Eyes: Conjunctivae and EOM are normal. Pupils are equal, round, and reactive to light. Right eye exhibits no discharge. Left eye exhibits no discharge. No scleral icterus.  Neck: Normal range of motion. No JVD present.  Cardiovascular: Normal rate, regular rhythm, normal heart sounds and intact distal pulses.  Exam reveals no gallop and no friction rub.   No murmur heard. Pulmonary/Chest: Effort normal. No respiratory distress. She has no wheezes. She has no rales.  Right upper/Mid  lobe rales.   Abdominal: Soft. Bowel sounds are normal. She exhibits no distension and no mass. There is no tenderness. There is no rebound and no guarding.  Lymphadenopathy:    She has no cervical adenopathy.  Neurological: She is oriented to person, place, and time.  Skin: Skin is warm and dry. No rash noted. No erythema. No pallor.  Psychiatric: She has a normal mood and affect.    Labs reviewed:  Recent Labs  06/03/15 1418 06/26/15 1252 06/27/15 06/27/15 0530 07/02/15  NA 135 137 134* 134* 136*  K 4.0 4.0  --  4.2 3.8  CL 98 101  --  98*  --   CO2 30 26   --  23  --   GLUCOSE 116* 101*  --  131*  --   BUN 15 15 11 11 16   CREATININE 0.92 0.86 0.8 0.76 0.9  CALCIUM 9.6 9.5  --  8.9  --     Recent Labs  08/27/14 1523 06/03/15 1418  AST 19 19  ALT 16 17  ALKPHOS 83 80  BILITOT 0.3 0.2  PROT 8.2 8.0  ALBUMIN 4.1 4.3    Recent Labs  08/27/14 1523 06/03/15 1418 06/26/15 1252  06/27/15 0530 07/02/15 07/07/15  WBC 8.6 10.9* 9.2  < > 10.8* 11.2 11.1  NEUTROABS 4.6 7.2  --   --   --   --   --   HGB 13.8 13.4 14.0  --  13.3 13.4 14.5  HCT 40.5 39.8 42.6  --  39.1 40 44  MCV 91.5 93.4 91.8  --  90.1  --   --   PLT 234.0 231.0 227  --  214 261 288  < > = values in this interval not displayed. Lab Results  Component Value Date   TSH 2.60 06/03/2015   Lab Results  Component Value Date   HGBA1C 6.2 06/03/2015   Lab Results  Component Value Date   CHOL 171 12/27/2012   HDL 37.80* 12/27/2012   LDLCALC 101* 12/27/2012   TRIG 161.0* 12/27/2012   CHOLHDL 5 12/27/2012    Significant Diagnostic Results in last 30 days:  Dg Cervical Spine 2-3 Views  06/26/2015  CLINICAL DATA:  Imaging for surgical localization for cervical spine surgery. EXAM: CERVICAL SPINE - 2-3 VIEW COMPARISON:  Cervical MRI, 06/24/2015 FINDINGS: Initial image shows placement of needle with its tip posterior to the posterior margin of the C2-C3 disc. Subsequent image shows placement of surgical probe. The tip projects 2 cm posterior to the posterior inferior margin of the C3 vertebral body. IMPRESSION: Surgical localization imaging as described. Electronically Signed  By: Lajean Manes M.D.   On: 06/26/2015 16:59   Dg Cervical Spine 2-3 Views  06/16/2015  CLINICAL DATA:  Numbness in upper extremities.  No recent trauma EXAM: CERVICAL SPINE - 2-3 VIEW COMPARISON:  None. FINDINGS: Frontal, lateral, and open-mouth odontoid images obtained. There is no demonstrable fracture or spondylolisthesis. Prevertebral soft tissues and predental space regions are normal. There is  marked disc space narrowing at C6-7 with moderately severe disc space narrowing at C5-6. There are anterior osteophytes at C4, C5, C6, and C7. No erosive change. Bones are osteoporotic. There is extensive calcification in each carotid artery. IMPRESSION: Multilevel arthropathy. No fracture or spondylolisthesis. Bones osteoporotic. Carotid artery calcification bilaterally. Electronically Signed   By: Lowella Grip III M.D.   On: 06/16/2015 11:30   Mr Cervical Spine Wo Contrast  06/24/2015  CLINICAL DATA:  Patient fell 1 year ago. Neck pain with upper and lower extremity weakness. Weak grip. EXAM: MRI CERVICAL SPINE WITHOUT CONTRAST TECHNIQUE: Multiplanar, multisequence MR imaging of the cervical spine was performed. No intravenous contrast was administered. COMPARISON:  Cervical spine radiographs 06/16/2015. No prior cross-sectional imaging is available. FINDINGS: Alignment: Reversal of the normal cervical lordotic curve. Trace anterolisthesis C3-C4. Vertebrae: No worrisome osseous lesion. Cord: Severe cord compression at C3-4. Canal diameter 5 mm. Abnormal cord signal is present, indicating either cord edema, gliosis, or myelomalacia. Query sequelae of central cord syndrome. No blood products are evident. Severe facet arthropathy bilaterally, greater on the LEFT, with joint effusion and bone marrow edema. Posterior Fossa: No tonsillar herniation. Vertebral Arteries: BILATERAL patent.  RIGHT dominant. Paraspinal tissues: No visible masses.  Suspected thyromegaly. Disc levels: The individual disc spaces were examined as follows: C2-3:  Mild bulge.  No impingement. C3-4: Central disc extrusion. Posterior element hypertrophy affecting facets and ligamentum flavum. Trace anterolisthesis. Severe spinal stenosis compressing the cord and resulting in abnormal cord signal. Canal diameter 5 mm. BILATERAL C4 nerve root impingement is also noted. C4-5: Trace anterolisthesis. Mild bulge. BILATERAL facet arthropathy, worse  on the RIGHT. RIGHT C5 nerve root impingement. C5-6: Central and rightward disc osteophyte complex. Severe right-sided foraminal narrowing, less so on the LEFT. BILATERAL RIGHT greater than LEFT C6 nerve root impingement. Mild stenosis, with slight cord flattening. Canal diameter 7-8 mm. C6-7: Severe disc space narrowing. Central protrusion. Moderate stenosis with ligamentum flavum hypertrophy. Cord flattening. Canal diameter 6-7 mm. BILATERAL C7 nerve root impingement. C7-T1: Central protrusion. Asymmetric ligamentum flavum hypertrophy on the LEFT related to facet disease. LEFT C8 nerve root impingement is possible. No cord compression. IMPRESSION: Severe cord compression at C3-4 related to central disc extrusion and posterior element hypertrophy. Canal diameter 5 mm. Abnormal cord signal is noted, which could represent a combination of acute and chronic injury. Given the history of previous fall, the abnormal cord signal could represent sequelae of central cord syndrome. Similar less severe changes at C4-5, C5-6, and C6-7 with varying degrees of spinal stenosis and neural impingement. Electronically Signed   By: Staci Righter M.D.   On: 06/24/2015 14:57    Assessment/Plan Acute cystitis with Hematuria  Recent U/A 07/10/2015 positive for  large leukocytes, blood and urine culture showed  citrobcter freundii and Morganella morganii. She continues to reports urine frequency and some burning. Afebrile. Will treated with Nitrofurantoin 100 mg Tablet twice daily X 7 days. Along  With Florastor 250 mg Capsule to prevent antibiotic associated diarrhea.  Non-productive cough: Afebrile. Continue with Robitussin DM. Ordering portable CXR PA/Lat    Family/ staff Communication: Reviewed  plan with patient and facility Nurse supervisor   Labs/tests ordered: Portable CXR Pa/Lat

## 2015-07-15 ENCOUNTER — Non-Acute Institutional Stay (SKILLED_NURSING_FACILITY): Payer: Medicare Other | Admitting: Family

## 2015-07-15 ENCOUNTER — Encounter: Payer: Medicare Other | Admitting: Family Medicine

## 2015-07-15 DIAGNOSIS — E039 Hypothyroidism, unspecified: Secondary | ICD-10-CM | POA: Diagnosis not present

## 2015-07-15 DIAGNOSIS — R269 Unspecified abnormalities of gait and mobility: Secondary | ICD-10-CM

## 2015-07-15 DIAGNOSIS — J449 Chronic obstructive pulmonary disease, unspecified: Secondary | ICD-10-CM | POA: Diagnosis not present

## 2015-07-15 DIAGNOSIS — I10 Essential (primary) hypertension: Secondary | ICD-10-CM | POA: Diagnosis not present

## 2015-07-15 DIAGNOSIS — M4712 Other spondylosis with myelopathy, cervical region: Secondary | ICD-10-CM

## 2015-07-15 DIAGNOSIS — N3000 Acute cystitis without hematuria: Secondary | ICD-10-CM

## 2015-07-15 NOTE — Progress Notes (Signed)
Patient ID: Diana [name], female   DOB: [birth year], 82 y.o.   MRN: [id number]  Location:  [facility] of Service:  SNF (31)  Provider:Mahiam Bubba Camp, MD   PCP: Eliezer Lofts, MD Patient Care Team: Jinny Sanders, MD as PCP - General  Extended Emergency Contact Information Primary Emergency Contact: Robello,Bruce Address: [address]          [place], [place] [address] Johnnette Litter of [place] Phone: [phone] Mobile Phone: [phone] Relation: Son  Code Status:DNR  Goals of care:  Advanced Directive information Advanced Directives 07/14/2015  Does patient have an advance directive? Yes  Type of Advance Directive (No Data)  Would patient like information on creating an advanced directive? -     Allergies  Allergen Reactions  . Meperidine Hcl Nausea And Vomiting    Chief Complaint  Patient presents with  . Discharge Note    Home     HPI:  82 y.o. female  Seen today at [facility] for discharge home.She has been here for short term rehabilitation post hospital admission from 06/26/15- 06/27/15 with cervical spondylosis with myelopathy and C3-4 stenosis.She underwent C3C4 laminectomy and decompression. She is seen in her room today.She states cough getting better. Her portable CXR done 07/14/2015 was negative for pneumonia. She denies any fever, chills, shortness of breath or wheezing.She is currently on Nitrofurantoin 100 mg Capsule twice daily X 7 days started 07/14/2015 for UTI.  Her neck pain is under control with current pain regimen. She has worked with PT/OT now stable for discharge home to continue with [facility] PT/OT for ROM, exercise, gait stability and muscle strengthening. She does not require any DME.Facility  Social worker will arrange for home health services prior to discharge.      Past Medical History  Diagnosis Date  . Other abnormal glucose   . Unspecified vitamin D deficiency   . Urinary tract infection, site not  specified   . Persistent disorder of initiating or maintaining sleep   . Pain in joint, shoulder region   . Palpitations   . Bacterial pneumonia, unspecified   . Candidiasis of mouth   . Unspecified viral infection, in conditions classified elsewhere and of unspecified site   . Lumbago   . Unspecified essential hypertension   . Hyposmolality and/or hyponatremia   . Unspecified hypothyroidism   . Cancer , breast   . Encephalitis 1961  . Chronic airway obstruction, not elsewhere classified     son not aware of this  . Obstructive chronic bronchitis with exacerbation ([place])   . Restless leg syndrome   . Arthritis   . Family history of adverse reaction to anesthesia     son has nausea  . Heart murmur     has had it for years; very mild AS 05/2015 echo    Past Surgical History  Procedure Laterality Date  . Abi  04/19/05    Negative  . Venous doppler  04/19/05    LE normal  . Back mri  2005    mild bulging disks  . Mastectomy, radical    . Eye surgery Bilateral     Cataract surgery   . Hemorrhoid surgery    . Posterior cervical laminectomy N/A 06/26/2015    Procedure: Cervical three-four POSTERIOR CERVICAL LAMINECTOMY for decompression ;  Surgeon: Kevan Ny Ditty, MD;  Location: [place] [facility];  Service: Neurosurgery;  Laterality: N/A;  C34 laminectomy  reports that she has quit smoking. Her smoking use included Cigarettes. She has a 21 pack-year smoking history. She has never used smokeless tobacco. She reports that she does not drink alcohol or use illicit drugs. Social History   Social History  . Marital Status: Widowed    Spouse Name: N/A  . Number of Children: N/A  . Years of Education: N/A   Occupational History  . Not on file.   Social History Main Topics  . Smoking status: Former Smoker -- 0.50 packs/day for 42 years    Types: Cigarettes  . Smokeless tobacco: Never Used  . Alcohol Use: No  . Drug Use: No  . Sexual Activity: Not on file   Other Topics  Concern  . Not on file   Social History Narrative   Married x60 years      No regular exercise-uses cane to ambulate, unsteady on feet      Diet: Moderately healthy, a lot of desserts       Living will, HCPOA: son: Diana [name]   Functional Status Survey:    Allergies  Allergen Reactions  . Meperidine Hcl Nausea And Vomiting    Pertinent  Health Maintenance Due  Topic Date Due  . DEXA SCAN  12/05/1990  . INFLUENZA VACCINE  11/18/2015  . PNA vac Low Risk Adult  Completed    Medications:   Medication List       This list is accurate as of: 07/15/15  5:27 PM.  Always use your most recent med list.               amLODipine 10 MG tablet  Commonly known as:  NORVASC  TAKE 1 TABLET BY MOUTH DAILY     bisacodyl 5 MG EC tablet  Commonly known as:  DULCOLAX  Take 1 tablet (5 mg total) by mouth daily as needed for moderate constipation.     cholecalciferol 1000 units tablet  Commonly known as:  VITAMIN D  Take 1,000 Units by mouth daily.     docusate sodium 100 MG capsule  Commonly known as:  COLACE  Take 1 capsule (100 mg total) by mouth 2 (two) times daily.     ergocalciferol 50000 units capsule  Commonly known as:  VITAMIN D2  Take 50,000 Units by mouth once a week.     furosemide 40 MG tablet  Commonly known as:  LASIX  TAKE 1 TABLET BY MOUTH DAILY     HYDROcodone-acetaminophen 5-325 MG tablet  Commonly known as:  NORCO/VICODIN  Take 1-2 tablets by mouth every 4 (four) hours as needed (mild pain).     hydrocortisone 2.5 % lotion  Apply 1 application topically daily as needed (for itching).     levothyroxine 100 MCG tablet  Commonly known as:  SYNTHROID, LEVOTHROID  TAKE 1 TABLET BY MOUTH DAILY     losartan 100 MG tablet  Commonly known as:  COZAAR  Take 100 mg by mouth daily.     meloxicam 15 MG tablet  Commonly known as:  MOBIC  Take 15 mg by mouth daily.     methocarbamol 500 MG tablet  Commonly known as:  ROBAXIN  Take 1 tablet (500 mg  total) by mouth every 6 (six) hours as needed for muscle spasms.     metoprolol succinate 50 MG 24 hr tablet  Commonly known as:  TOPROL-XL  TAKE 1 TABLET BY MOUTH DAILY     nitrofurantoin (macrocrystal-monohydrate) 100 MG capsule  Commonly known as:  MACROBID  Take 1 capsule (100 mg total) by mouth 2 (two) times daily.     ROBITUSSIN DM PO  Take 54ml by mouth every 6 hours as needed for cough     saccharomyces boulardii 250 MG capsule  Commonly known as:  FLORASTOR  Take 1 capsule (250 mg total) by mouth 2 (two) times daily.        Review of Systems  Constitutional: Negative for fever, chills, activity change and fatigue.  HENT: Negative for congestion, rhinorrhea, sinus pressure and sore throat.   Eyes: Negative.   Respiratory: Positive for cough. Negative for chest tightness, shortness of breath and wheezing.   Cardiovascular: Negative for chest pain, palpitations and leg swelling.  Gastrointestinal: Negative for nausea, vomiting, abdominal pain, diarrhea, constipation and abdominal distention.  Genitourinary: Negative for dysuria, frequency and flank pain.  Skin: Negative.   Neurological: Negative.   Psychiatric/Behavioral: Negative.     Filed Vitals:   07/15/15 1720  BP: 142/70  Pulse: 61  Temp: 97 F (36.1 C)  Resp: 18  SpO2: 98%   There is no weight on file to calculate BMI. Physical Exam  Constitutional: She is oriented to person, place, and time. She appears well-developed and well-nourished. No distress.  HENT:  Head: Normocephalic.  Right Ear: External ear normal.  Left Ear: External ear normal.  Mouth/Throat: Oropharynx is clear and moist.  Eyes: Conjunctivae and EOM are normal. Pupils are equal, round, and reactive to light. Right eye exhibits no discharge. Left eye exhibits no discharge. No scleral icterus.  Neck: Normal range of motion. No JVD present. No thyromegaly present.  Cardiovascular: Normal rate, regular rhythm, normal heart sounds and  intact distal pulses.  Exam reveals no gallop and no friction rub.   No murmur heard. Pulmonary/Chest: Effort normal and breath sounds normal. No respiratory distress. She has no wheezes. She has no rales.  Abdominal: Soft. Bowel sounds are normal. She exhibits no distension and no mass. There is no tenderness. There is no rebound and no guarding.  Musculoskeletal: Normal range of motion. She exhibits no edema or tenderness.  Normal ROM except neck due surgical incision.   Lymphadenopathy:    She has no cervical adenopathy.  Neurological: She is oriented to person, place, and time.  Skin: Skin is warm and dry. No rash noted. No erythema. No pallor.  Posterior neck incision dry, clean and intact without any signs of swelling, redness or drainage noted.   Psychiatric: She has a normal mood and affect.    Labs reviewed: Basic Metabolic Panel:  Recent Labs  06/03/15 1418 06/26/15 1252 06/27/15 06/27/15 0530 07/02/15  NA 135 137 134* 134* 136*  K 4.0 4.0  --  4.2 3.8  CL 98 101  --  98*  --   CO2 30 26  --  23  --   GLUCOSE 116* 101*  --  131*  --   BUN 15 15 11 11 16   CREATININE 0.92 0.86 0.8 0.76 0.9  CALCIUM 9.6 9.5  --  8.9  --    Liver Function Tests:  Recent Labs  08/27/14 1523 06/03/15 1418  AST 19 19  ALT 16 17  ALKPHOS 83 80  BILITOT 0.3 0.2  PROT 8.2 8.0  ALBUMIN 4.1 4.3   No results for input(s): LIPASE, AMYLASE in the last 8760 hours. No results for input(s): AMMONIA in the last 8760 hours. CBC:  Recent Labs  08/27/14 1523 06/03/15 1418 06/26/15 1252  06/27/15 0530 07/02/15 07/07/15  WBC 8.6 10.9* 9.2  < > 10.8* 11.2 11.1  NEUTROABS 4.6 7.2  --   --   --   --   --   HGB 13.8 13.4 14.0  --  13.3 13.4 14.5  HCT 40.5 39.8 42.6  --  39.1 40 44  MCV 91.5 93.4 91.8  --  90.1  --   --   PLT 234.0 231.0 227  --  214 261 288  < > = values in this interval not displayed. Cardiac Enzymes: No results for input(s): CKTOTAL, CKMB, CKMBINDEX, TROPONINI in the  last 8760 hours. BNP: Invalid input(s): POCBNP CBG: No results for input(s): GLUCAP in the last 8760 hours.  Procedures and Imaging Studies During Stay: Dg Cervical Spine 2-3 Views  06/26/2015  CLINICAL DATA:  Imaging for surgical localization for cervical spine surgery. EXAM: CERVICAL SPINE - 2-3 VIEW COMPARISON:  Cervical MRI, 06/24/2015 FINDINGS: Initial image shows placement of needle with its tip posterior to the posterior margin of the C2-C3 disc. Subsequent image shows placement of surgical probe. The tip projects 2 cm posterior to the posterior inferior margin of the C3 vertebral body. IMPRESSION: Surgical localization imaging as described. Electronically Signed   By: Lajean Manes M.D.   On: 06/26/2015 16:59   Dg Cervical Spine 2-3 Views  06/16/2015  CLINICAL DATA:  Numbness in upper extremities.  No recent trauma EXAM: CERVICAL SPINE - 2-3 VIEW COMPARISON:  None. FINDINGS: Frontal, lateral, and open-mouth odontoid images obtained. There is no demonstrable fracture or spondylolisthesis. Prevertebral soft tissues and predental space regions are normal. There is marked disc space narrowing at C6-7 with moderately severe disc space narrowing at C5-6. There are anterior osteophytes at C4, C5, C6, and C7. No erosive change. Bones are osteoporotic. There is extensive calcification in each carotid artery. IMPRESSION: Multilevel arthropathy. No fracture or spondylolisthesis. Bones osteoporotic. Carotid artery calcification bilaterally. Electronically Signed   By: Lowella Grip III M.D.   On: 06/16/2015 11:30   Mr Cervical Spine Wo Contrast  06/24/2015  CLINICAL DATA:  Patient fell 1 year ago. Neck pain with upper and lower extremity weakness. Weak grip. EXAM: MRI CERVICAL SPINE WITHOUT CONTRAST TECHNIQUE: Multiplanar, multisequence MR imaging of the cervical spine was performed. No intravenous contrast was administered. COMPARISON:  Cervical spine radiographs 06/16/2015. No prior cross-sectional  imaging is available. FINDINGS: Alignment: Reversal of the normal cervical lordotic curve. Trace anterolisthesis C3-C4. Vertebrae: No worrisome osseous lesion. Cord: Severe cord compression at C3-4. Canal diameter 5 mm. Abnormal cord signal is present, indicating either cord edema, gliosis, or myelomalacia. Query sequelae of central cord syndrome. No blood products are evident. Severe facet arthropathy bilaterally, greater on the LEFT, with joint effusion and bone marrow edema. Posterior Fossa: No tonsillar herniation. Vertebral Arteries: BILATERAL patent.  RIGHT dominant. Paraspinal tissues: No visible masses.  Suspected thyromegaly. Disc levels: The individual disc spaces were examined as follows: C2-3:  Mild bulge.  No impingement. C3-4: Central disc extrusion. Posterior element hypertrophy affecting facets and ligamentum flavum. Trace anterolisthesis. Severe spinal stenosis compressing the cord and resulting in abnormal cord signal. Canal diameter 5 mm. BILATERAL C4 nerve root impingement is also noted. C4-5: Trace anterolisthesis. Mild bulge. BILATERAL facet arthropathy, worse on the RIGHT. RIGHT C5 nerve root impingement. C5-6: Central and rightward disc osteophyte complex. Severe right-sided foraminal narrowing, less so on the LEFT. BILATERAL RIGHT greater than LEFT C6 nerve root impingement. Mild stenosis, with slight cord flattening. Canal diameter 7-8 mm. C6-7: Severe disc space narrowing. Central  protrusion. Moderate stenosis with ligamentum flavum hypertrophy. Cord flattening. Canal diameter 6-7 mm. BILATERAL C7 nerve root impingement. C7-T1: Central protrusion. Asymmetric ligamentum flavum hypertrophy on the LEFT related to facet disease. LEFT C8 nerve root impingement is possible. No cord compression. IMPRESSION: Severe cord compression at C3-4 related to central disc extrusion and posterior element hypertrophy. Canal diameter 5 mm. Abnormal cord signal is noted, which could represent a combination of  acute and chronic injury. Given the history of previous fall, the abnormal cord signal could represent sequelae of central cord syndrome. Similar less severe changes at C4-5, C5-6, and C6-7 with varying degrees of spinal stenosis and neural impingement. Electronically Signed   By: Staci Righter M.D.   On: 06/24/2015 14:57    Assessment/Plan:   1. Essential hypertension B/p stable. Continue Amlodipine, Cozaar, lasix and Metoprolol. PCP to monitor BMP  2. Chronic obstructive pulmonary disease, unspecified COPD type (HCC) Cough has improved. Recent CXR negative for Pneumonia showed chronic lung disease. Exam findings negative for shortness of breath, wheezing or rales. Continue on Robitussin PRN.   3. Hypothyroidism, unspecified hypothyroidism type Continue on Levothyroxine 100 mcg Tablet. PCP to monitor TSH level.   4. Cervical spondylosis with myelopathy She is S/p Hosp admission 06/26/2015-06/27/2015 C3-C4 laminectomy for decompression. Incision site without any signs of infections Progressive healing. Follow up with Ortho.  5. Abnormality of gait Has improved with PT/OT will discharge home with  PT/OT for ROM, exercise, gait stability and muscle strengthening. She does not require any DME.Facility  Social worker will arrange for home health services prior to discharge. Fall and safety precaution.     6. UTI Afebrile. currently on Nitrofurantoin 100 mg Capsule twice daily X 7 days started 07/14/2015 for UTI along with Florastor 250 mg Capsule X 10 days for prevention of antibiotics associated diarrhea.    Patient is being discharged with the following home health services:     PT/OT for ROM, exercise, gait stability and muscle strengthening.     Patient is being discharged with the following durable medical equipment:   She does not require any DME.  Rx written x 1 month   Patient has been advised to f/u with their PCP in 1-2 weeks to bring them up to date on their rehab stay.  Social  services at facility was responsible for arranging this appointment.  Pt was provided with a 30 day supply of prescriptions for medications and refills must be obtained from their PCP.  For controlled substances, a more limited supply may be provided adequate until PCP appointment only.  Future labs/tests needed: CBC, BMP, TSH level with PCP

## 2015-07-17 DIAGNOSIS — I10 Essential (primary) hypertension: Secondary | ICD-10-CM | POA: Diagnosis not present

## 2015-07-17 DIAGNOSIS — J449 Chronic obstructive pulmonary disease, unspecified: Secondary | ICD-10-CM | POA: Diagnosis not present

## 2015-07-17 DIAGNOSIS — M4712 Other spondylosis with myelopathy, cervical region: Secondary | ICD-10-CM | POA: Diagnosis not present

## 2015-07-17 DIAGNOSIS — M4802 Spinal stenosis, cervical region: Secondary | ICD-10-CM | POA: Diagnosis not present

## 2015-07-17 DIAGNOSIS — B964 Proteus (mirabilis) (morganii) as the cause of diseases classified elsewhere: Secondary | ICD-10-CM | POA: Diagnosis not present

## 2015-07-17 DIAGNOSIS — N3000 Acute cystitis without hematuria: Secondary | ICD-10-CM | POA: Diagnosis not present

## 2015-07-17 DIAGNOSIS — R7303 Prediabetes: Secondary | ICD-10-CM | POA: Diagnosis not present

## 2015-07-17 DIAGNOSIS — M1990 Unspecified osteoarthritis, unspecified site: Secondary | ICD-10-CM | POA: Diagnosis not present

## 2015-07-17 DIAGNOSIS — Z4789 Encounter for other orthopedic aftercare: Secondary | ICD-10-CM | POA: Diagnosis not present

## 2015-07-17 DIAGNOSIS — Z9013 Acquired absence of bilateral breasts and nipples: Secondary | ICD-10-CM | POA: Diagnosis not present

## 2015-07-17 DIAGNOSIS — Z853 Personal history of malignant neoplasm of breast: Secondary | ICD-10-CM | POA: Diagnosis not present

## 2015-07-21 DIAGNOSIS — J449 Chronic obstructive pulmonary disease, unspecified: Secondary | ICD-10-CM | POA: Diagnosis not present

## 2015-07-21 DIAGNOSIS — M4712 Other spondylosis with myelopathy, cervical region: Secondary | ICD-10-CM | POA: Diagnosis not present

## 2015-07-21 DIAGNOSIS — Z4789 Encounter for other orthopedic aftercare: Secondary | ICD-10-CM | POA: Diagnosis not present

## 2015-07-21 DIAGNOSIS — N3000 Acute cystitis without hematuria: Secondary | ICD-10-CM | POA: Diagnosis not present

## 2015-07-21 DIAGNOSIS — Z853 Personal history of malignant neoplasm of breast: Secondary | ICD-10-CM | POA: Diagnosis not present

## 2015-07-21 DIAGNOSIS — Z9013 Acquired absence of bilateral breasts and nipples: Secondary | ICD-10-CM | POA: Diagnosis not present

## 2015-07-21 DIAGNOSIS — R7303 Prediabetes: Secondary | ICD-10-CM | POA: Diagnosis not present

## 2015-07-21 DIAGNOSIS — I10 Essential (primary) hypertension: Secondary | ICD-10-CM | POA: Diagnosis not present

## 2015-07-21 DIAGNOSIS — B964 Proteus (mirabilis) (morganii) as the cause of diseases classified elsewhere: Secondary | ICD-10-CM | POA: Diagnosis not present

## 2015-07-21 DIAGNOSIS — M4802 Spinal stenosis, cervical region: Secondary | ICD-10-CM | POA: Diagnosis not present

## 2015-07-21 DIAGNOSIS — M1990 Unspecified osteoarthritis, unspecified site: Secondary | ICD-10-CM | POA: Diagnosis not present

## 2015-07-23 ENCOUNTER — Ambulatory Visit: Payer: Medicare Other | Admitting: Family Medicine

## 2015-07-23 DIAGNOSIS — M4712 Other spondylosis with myelopathy, cervical region: Secondary | ICD-10-CM | POA: Diagnosis not present

## 2015-07-23 DIAGNOSIS — Z9013 Acquired absence of bilateral breasts and nipples: Secondary | ICD-10-CM | POA: Diagnosis not present

## 2015-07-23 DIAGNOSIS — R7303 Prediabetes: Secondary | ICD-10-CM | POA: Diagnosis not present

## 2015-07-23 DIAGNOSIS — M4802 Spinal stenosis, cervical region: Secondary | ICD-10-CM | POA: Diagnosis not present

## 2015-07-23 DIAGNOSIS — Z853 Personal history of malignant neoplasm of breast: Secondary | ICD-10-CM | POA: Diagnosis not present

## 2015-07-23 DIAGNOSIS — B964 Proteus (mirabilis) (morganii) as the cause of diseases classified elsewhere: Secondary | ICD-10-CM | POA: Diagnosis not present

## 2015-07-23 DIAGNOSIS — Z4789 Encounter for other orthopedic aftercare: Secondary | ICD-10-CM | POA: Diagnosis not present

## 2015-07-23 DIAGNOSIS — I10 Essential (primary) hypertension: Secondary | ICD-10-CM | POA: Diagnosis not present

## 2015-07-23 DIAGNOSIS — M1990 Unspecified osteoarthritis, unspecified site: Secondary | ICD-10-CM | POA: Diagnosis not present

## 2015-07-23 DIAGNOSIS — J449 Chronic obstructive pulmonary disease, unspecified: Secondary | ICD-10-CM | POA: Diagnosis not present

## 2015-07-23 DIAGNOSIS — N3000 Acute cystitis without hematuria: Secondary | ICD-10-CM | POA: Diagnosis not present

## 2015-07-25 DIAGNOSIS — Z4789 Encounter for other orthopedic aftercare: Secondary | ICD-10-CM | POA: Diagnosis not present

## 2015-07-25 DIAGNOSIS — Z853 Personal history of malignant neoplasm of breast: Secondary | ICD-10-CM | POA: Diagnosis not present

## 2015-07-25 DIAGNOSIS — M1990 Unspecified osteoarthritis, unspecified site: Secondary | ICD-10-CM | POA: Diagnosis not present

## 2015-07-25 DIAGNOSIS — N3000 Acute cystitis without hematuria: Secondary | ICD-10-CM | POA: Diagnosis not present

## 2015-07-25 DIAGNOSIS — Z9013 Acquired absence of bilateral breasts and nipples: Secondary | ICD-10-CM | POA: Diagnosis not present

## 2015-07-25 DIAGNOSIS — J449 Chronic obstructive pulmonary disease, unspecified: Secondary | ICD-10-CM | POA: Diagnosis not present

## 2015-07-25 DIAGNOSIS — M4712 Other spondylosis with myelopathy, cervical region: Secondary | ICD-10-CM | POA: Diagnosis not present

## 2015-07-25 DIAGNOSIS — M4802 Spinal stenosis, cervical region: Secondary | ICD-10-CM | POA: Diagnosis not present

## 2015-07-25 DIAGNOSIS — I10 Essential (primary) hypertension: Secondary | ICD-10-CM | POA: Diagnosis not present

## 2015-07-25 DIAGNOSIS — B964 Proteus (mirabilis) (morganii) as the cause of diseases classified elsewhere: Secondary | ICD-10-CM | POA: Diagnosis not present

## 2015-07-25 DIAGNOSIS — R7303 Prediabetes: Secondary | ICD-10-CM | POA: Diagnosis not present

## 2015-07-28 ENCOUNTER — Telehealth: Payer: Self-pay | Admitting: Family Medicine

## 2015-07-28 DIAGNOSIS — N3000 Acute cystitis without hematuria: Secondary | ICD-10-CM | POA: Diagnosis not present

## 2015-07-28 DIAGNOSIS — Z9013 Acquired absence of bilateral breasts and nipples: Secondary | ICD-10-CM | POA: Diagnosis not present

## 2015-07-28 DIAGNOSIS — Z853 Personal history of malignant neoplasm of breast: Secondary | ICD-10-CM | POA: Diagnosis not present

## 2015-07-28 DIAGNOSIS — M1990 Unspecified osteoarthritis, unspecified site: Secondary | ICD-10-CM | POA: Diagnosis not present

## 2015-07-28 DIAGNOSIS — I10 Essential (primary) hypertension: Secondary | ICD-10-CM | POA: Diagnosis not present

## 2015-07-28 DIAGNOSIS — J449 Chronic obstructive pulmonary disease, unspecified: Secondary | ICD-10-CM | POA: Diagnosis not present

## 2015-07-28 DIAGNOSIS — M4712 Other spondylosis with myelopathy, cervical region: Secondary | ICD-10-CM | POA: Diagnosis not present

## 2015-07-28 DIAGNOSIS — R7303 Prediabetes: Secondary | ICD-10-CM | POA: Diagnosis not present

## 2015-07-28 DIAGNOSIS — Z4789 Encounter for other orthopedic aftercare: Secondary | ICD-10-CM | POA: Diagnosis not present

## 2015-07-28 DIAGNOSIS — M4802 Spinal stenosis, cervical region: Secondary | ICD-10-CM | POA: Diagnosis not present

## 2015-07-28 DIAGNOSIS — B964 Proteus (mirabilis) (morganii) as the cause of diseases classified elsewhere: Secondary | ICD-10-CM | POA: Diagnosis not present

## 2015-07-28 NOTE — Telephone Encounter (Signed)
Connie @ [facility] called Verbal order for OT for 2 x times week for 4 weeks

## 2015-07-28 NOTE — Telephone Encounter (Signed)
Left message for Diana [name] with [facility] with verbal okay for OT 2 x a week for 4 weeks.

## 2015-07-29 ENCOUNTER — Other Ambulatory Visit: Payer: Self-pay | Admitting: Family Medicine

## 2015-07-30 DIAGNOSIS — N3000 Acute cystitis without hematuria: Secondary | ICD-10-CM | POA: Diagnosis not present

## 2015-07-30 DIAGNOSIS — R7303 Prediabetes: Secondary | ICD-10-CM | POA: Diagnosis not present

## 2015-07-30 DIAGNOSIS — J449 Chronic obstructive pulmonary disease, unspecified: Secondary | ICD-10-CM | POA: Diagnosis not present

## 2015-07-30 DIAGNOSIS — M1990 Unspecified osteoarthritis, unspecified site: Secondary | ICD-10-CM | POA: Diagnosis not present

## 2015-07-30 DIAGNOSIS — M4712 Other spondylosis with myelopathy, cervical region: Secondary | ICD-10-CM | POA: Diagnosis not present

## 2015-07-30 DIAGNOSIS — B964 Proteus (mirabilis) (morganii) as the cause of diseases classified elsewhere: Secondary | ICD-10-CM | POA: Diagnosis not present

## 2015-07-30 DIAGNOSIS — M4802 Spinal stenosis, cervical region: Secondary | ICD-10-CM | POA: Diagnosis not present

## 2015-07-30 DIAGNOSIS — Z9013 Acquired absence of bilateral breasts and nipples: Secondary | ICD-10-CM | POA: Diagnosis not present

## 2015-07-30 DIAGNOSIS — Z4789 Encounter for other orthopedic aftercare: Secondary | ICD-10-CM | POA: Diagnosis not present

## 2015-07-30 DIAGNOSIS — I10 Essential (primary) hypertension: Secondary | ICD-10-CM | POA: Diagnosis not present

## 2015-07-30 DIAGNOSIS — Z853 Personal history of malignant neoplasm of breast: Secondary | ICD-10-CM | POA: Diagnosis not present

## 2015-07-31 DIAGNOSIS — J449 Chronic obstructive pulmonary disease, unspecified: Secondary | ICD-10-CM | POA: Diagnosis not present

## 2015-07-31 DIAGNOSIS — B964 Proteus (mirabilis) (morganii) as the cause of diseases classified elsewhere: Secondary | ICD-10-CM | POA: Diagnosis not present

## 2015-07-31 DIAGNOSIS — Z4789 Encounter for other orthopedic aftercare: Secondary | ICD-10-CM | POA: Diagnosis not present

## 2015-07-31 DIAGNOSIS — R7303 Prediabetes: Secondary | ICD-10-CM | POA: Diagnosis not present

## 2015-07-31 DIAGNOSIS — I10 Essential (primary) hypertension: Secondary | ICD-10-CM | POA: Diagnosis not present

## 2015-07-31 DIAGNOSIS — M4712 Other spondylosis with myelopathy, cervical region: Secondary | ICD-10-CM | POA: Diagnosis not present

## 2015-07-31 DIAGNOSIS — N3000 Acute cystitis without hematuria: Secondary | ICD-10-CM | POA: Diagnosis not present

## 2015-07-31 DIAGNOSIS — M4802 Spinal stenosis, cervical region: Secondary | ICD-10-CM | POA: Diagnosis not present

## 2015-07-31 DIAGNOSIS — Z9013 Acquired absence of bilateral breasts and nipples: Secondary | ICD-10-CM | POA: Diagnosis not present

## 2015-07-31 DIAGNOSIS — M1990 Unspecified osteoarthritis, unspecified site: Secondary | ICD-10-CM | POA: Diagnosis not present

## 2015-07-31 DIAGNOSIS — Z853 Personal history of malignant neoplasm of breast: Secondary | ICD-10-CM | POA: Diagnosis not present

## 2015-08-04 DIAGNOSIS — Z4789 Encounter for other orthopedic aftercare: Secondary | ICD-10-CM | POA: Diagnosis not present

## 2015-08-04 DIAGNOSIS — M4712 Other spondylosis with myelopathy, cervical region: Secondary | ICD-10-CM | POA: Diagnosis not present

## 2015-08-04 DIAGNOSIS — N3000 Acute cystitis without hematuria: Secondary | ICD-10-CM | POA: Diagnosis not present

## 2015-08-04 DIAGNOSIS — B964 Proteus (mirabilis) (morganii) as the cause of diseases classified elsewhere: Secondary | ICD-10-CM | POA: Diagnosis not present

## 2015-08-04 DIAGNOSIS — I10 Essential (primary) hypertension: Secondary | ICD-10-CM | POA: Diagnosis not present

## 2015-08-04 DIAGNOSIS — J449 Chronic obstructive pulmonary disease, unspecified: Secondary | ICD-10-CM | POA: Diagnosis not present

## 2015-08-04 DIAGNOSIS — R7303 Prediabetes: Secondary | ICD-10-CM | POA: Diagnosis not present

## 2015-08-04 DIAGNOSIS — Z853 Personal history of malignant neoplasm of breast: Secondary | ICD-10-CM | POA: Diagnosis not present

## 2015-08-04 DIAGNOSIS — M4802 Spinal stenosis, cervical region: Secondary | ICD-10-CM | POA: Diagnosis not present

## 2015-08-04 DIAGNOSIS — Z9013 Acquired absence of bilateral breasts and nipples: Secondary | ICD-10-CM | POA: Diagnosis not present

## 2015-08-04 DIAGNOSIS — M1990 Unspecified osteoarthritis, unspecified site: Secondary | ICD-10-CM | POA: Diagnosis not present

## 2015-08-05 DIAGNOSIS — J449 Chronic obstructive pulmonary disease, unspecified: Secondary | ICD-10-CM | POA: Diagnosis not present

## 2015-08-05 DIAGNOSIS — Z4789 Encounter for other orthopedic aftercare: Secondary | ICD-10-CM | POA: Diagnosis not present

## 2015-08-05 DIAGNOSIS — I10 Essential (primary) hypertension: Secondary | ICD-10-CM | POA: Diagnosis not present

## 2015-08-05 DIAGNOSIS — N3000 Acute cystitis without hematuria: Secondary | ICD-10-CM | POA: Diagnosis not present

## 2015-08-05 DIAGNOSIS — Z853 Personal history of malignant neoplasm of breast: Secondary | ICD-10-CM | POA: Diagnosis not present

## 2015-08-05 DIAGNOSIS — B964 Proteus (mirabilis) (morganii) as the cause of diseases classified elsewhere: Secondary | ICD-10-CM | POA: Diagnosis not present

## 2015-08-05 DIAGNOSIS — M4802 Spinal stenosis, cervical region: Secondary | ICD-10-CM | POA: Diagnosis not present

## 2015-08-05 DIAGNOSIS — M1990 Unspecified osteoarthritis, unspecified site: Secondary | ICD-10-CM | POA: Diagnosis not present

## 2015-08-05 DIAGNOSIS — R7303 Prediabetes: Secondary | ICD-10-CM | POA: Diagnosis not present

## 2015-08-05 DIAGNOSIS — Z9013 Acquired absence of bilateral breasts and nipples: Secondary | ICD-10-CM | POA: Diagnosis not present

## 2015-08-05 DIAGNOSIS — M4712 Other spondylosis with myelopathy, cervical region: Secondary | ICD-10-CM | POA: Diagnosis not present

## 2015-08-06 ENCOUNTER — Other Ambulatory Visit: Payer: Self-pay | Admitting: Family Medicine

## 2015-08-06 DIAGNOSIS — N3000 Acute cystitis without hematuria: Secondary | ICD-10-CM | POA: Diagnosis not present

## 2015-08-06 DIAGNOSIS — M4802 Spinal stenosis, cervical region: Secondary | ICD-10-CM | POA: Diagnosis not present

## 2015-08-06 DIAGNOSIS — M1990 Unspecified osteoarthritis, unspecified site: Secondary | ICD-10-CM | POA: Diagnosis not present

## 2015-08-06 DIAGNOSIS — R7303 Prediabetes: Secondary | ICD-10-CM | POA: Diagnosis not present

## 2015-08-06 DIAGNOSIS — Z4789 Encounter for other orthopedic aftercare: Secondary | ICD-10-CM | POA: Diagnosis not present

## 2015-08-06 DIAGNOSIS — B964 Proteus (mirabilis) (morganii) as the cause of diseases classified elsewhere: Secondary | ICD-10-CM | POA: Diagnosis not present

## 2015-08-06 DIAGNOSIS — M4712 Other spondylosis with myelopathy, cervical region: Secondary | ICD-10-CM | POA: Diagnosis not present

## 2015-08-06 DIAGNOSIS — Z9013 Acquired absence of bilateral breasts and nipples: Secondary | ICD-10-CM | POA: Diagnosis not present

## 2015-08-06 DIAGNOSIS — I10 Essential (primary) hypertension: Secondary | ICD-10-CM | POA: Diagnosis not present

## 2015-08-06 DIAGNOSIS — Z853 Personal history of malignant neoplasm of breast: Secondary | ICD-10-CM | POA: Diagnosis not present

## 2015-08-06 DIAGNOSIS — J449 Chronic obstructive pulmonary disease, unspecified: Secondary | ICD-10-CM | POA: Diagnosis not present

## 2015-08-07 ENCOUNTER — Ambulatory Visit (INDEPENDENT_AMBULATORY_CARE_PROVIDER_SITE_OTHER): Payer: Medicare Other | Admitting: Family Medicine

## 2015-08-07 ENCOUNTER — Encounter: Payer: Self-pay | Admitting: Family Medicine

## 2015-08-07 VITALS — BP 120/68 | HR 61 | Temp 98.5°F | Ht 62.0 in | Wt 153.1 lb

## 2015-08-07 DIAGNOSIS — Z4789 Encounter for other orthopedic aftercare: Secondary | ICD-10-CM | POA: Diagnosis not present

## 2015-08-07 DIAGNOSIS — N3000 Acute cystitis without hematuria: Secondary | ICD-10-CM

## 2015-08-07 DIAGNOSIS — I10 Essential (primary) hypertension: Secondary | ICD-10-CM

## 2015-08-07 DIAGNOSIS — Z853 Personal history of malignant neoplasm of breast: Secondary | ICD-10-CM | POA: Diagnosis not present

## 2015-08-07 DIAGNOSIS — M1990 Unspecified osteoarthritis, unspecified site: Secondary | ICD-10-CM | POA: Diagnosis not present

## 2015-08-07 DIAGNOSIS — R531 Weakness: Secondary | ICD-10-CM | POA: Diagnosis not present

## 2015-08-07 DIAGNOSIS — M4802 Spinal stenosis, cervical region: Secondary | ICD-10-CM

## 2015-08-07 DIAGNOSIS — B964 Proteus (mirabilis) (morganii) as the cause of diseases classified elsewhere: Secondary | ICD-10-CM | POA: Diagnosis not present

## 2015-08-07 DIAGNOSIS — M4712 Other spondylosis with myelopathy, cervical region: Secondary | ICD-10-CM | POA: Diagnosis not present

## 2015-08-07 DIAGNOSIS — N390 Urinary tract infection, site not specified: Secondary | ICD-10-CM | POA: Insufficient documentation

## 2015-08-07 DIAGNOSIS — Z9013 Acquired absence of bilateral breasts and nipples: Secondary | ICD-10-CM | POA: Diagnosis not present

## 2015-08-07 DIAGNOSIS — J449 Chronic obstructive pulmonary disease, unspecified: Secondary | ICD-10-CM | POA: Diagnosis not present

## 2015-08-07 DIAGNOSIS — R7303 Prediabetes: Secondary | ICD-10-CM | POA: Diagnosis not present

## 2015-08-07 NOTE — Assessment & Plan Note (Signed)
Well controlled. Continue current medication.  

## 2015-08-07 NOTE — Assessment & Plan Note (Signed)
S/P decompression 3/9

## 2015-08-07 NOTE — Progress Notes (Signed)
Subjective:    Patient ID: Diana [name], female    DOB: 06-Oct-1923, 82 y.o.   MRN: [id number]  HPI  83 year old female presents for follow up D/C from [facility] on  3/28  At [facility] for discharge home.She has been here for short term rehabilitation post hospital admission from 06/26/15- 06/27/15 with cervical spondylosis with myelopathy and C3-4 stenosis.She underwent C3C4 laminectomy and decompression. Had cough: portable CXR done 07/14/2015 was negative for pneumonia. She denies any fever, chills, shortness of breath or wheezing. Was on Nitrofurantoin 100 mg Capsule twice daily X 7 days started 07/14/2015 for UTI.  Her neck pain is under control with current pain regimen.  Has HH PT/OT for ROM, exercise, gait stability and muscle strengthening.   Since the decompression hear overall strength has improved. Now able to walk a walker short distance. Arms strength is improved some. Able to feed herself now, toileting, washes own dishes. She is feeling safe at home when son at work now.  She has friend that will take her to MD offices.  Cough is improving.No dysuria or UTI symptoms.  BP Readings from Last 3 Encounters:  08/07/15 120/68  07/15/15 142/70  07/14/15 125/69  HTN well controlled  At home on toprol XL, losartan, lasix daily, amlodipine.    Wt Readings from Last 3 Encounters:  08/07/15 153 lb 1.9 oz (69.455 kg)  07/14/15 175 lb (79.379 kg)  07/09/15 175 lb (79.379 kg)    Review of Systems  Constitutional: Negative for fever and fatigue.  HENT: Negative for ear pain.   Eyes: Negative for pain.  Respiratory: Negative for chest tightness and shortness of breath.   Cardiovascular: Negative for chest pain, palpitations and leg swelling.  Gastrointestinal: Negative for abdominal pain.  Genitourinary: Negative for dysuria.       Objective:   Physical Exam  Constitutional: Vital signs are normal. She appears well-developed and well-nourished. She  is cooperative.  Non-toxic appearance. She does not appear ill. No distress.  HENT:  Head: Normocephalic.  Right Ear: Hearing, tympanic membrane, external ear and ear canal normal. Tympanic membrane is not erythematous, not retracted and not bulging.  Left Ear: Hearing, tympanic membrane, external ear and ear canal normal. Tympanic membrane is not erythematous, not retracted and not bulging.  Nose: No mucosal edema or rhinorrhea. Right sinus exhibits no maxillary sinus tenderness and no frontal sinus tenderness. Left sinus exhibits no maxillary sinus tenderness and no frontal sinus tenderness.  Mouth/Throat: Uvula is midline, oropharynx is clear and moist and mucous membranes are normal.  Eyes: Conjunctivae, EOM and lids are normal. Pupils are equal, round, and reactive to light. Lids are everted and swept, no foreign bodies found.  Neck: Trachea normal and normal range of motion. Neck supple. Carotid bruit is not present. No thyroid mass and no thyromegaly present.  Cardiovascular: Normal rate, regular rhythm, S1 normal, S2 normal, normal heart sounds, intact distal pulses and normal pulses.  Exam reveals no gallop and no friction rub.   No murmur heard. Pulmonary/Chest: Effort normal and breath sounds normal. No tachypnea. No respiratory distress. She has no decreased breath sounds. She has no wheezes. She has no rhonchi. She has no rales.  Abdominal: Soft. Normal appearance and bowel sounds are normal. There is no tenderness.  Musculoskeletal:     Neurological: She is alert.  Now 4-5 strength throughout upper and lower ext. Healing surgical scar on neck.  Skin: Skin is warm, dry and intact.  No rash noted.  Psychiatric: Her speech is normal and behavior is normal. Judgment and thought content normal. Her mood appears not anxious. Cognition and memory are normal. She does not exhibit a depressed mood.          Assessment & Plan:

## 2015-08-07 NOTE — Assessment & Plan Note (Signed)
Improving S/P decompression with home PT, OT.  Now able to live on own.

## 2015-08-07 NOTE — Assessment & Plan Note (Signed)
Resolved s/p nitrofurantoin.

## 2015-08-08 DIAGNOSIS — R7303 Prediabetes: Secondary | ICD-10-CM | POA: Diagnosis not present

## 2015-08-08 DIAGNOSIS — M4712 Other spondylosis with myelopathy, cervical region: Secondary | ICD-10-CM | POA: Diagnosis not present

## 2015-08-08 DIAGNOSIS — J449 Chronic obstructive pulmonary disease, unspecified: Secondary | ICD-10-CM | POA: Diagnosis not present

## 2015-08-08 DIAGNOSIS — Z9013 Acquired absence of bilateral breasts and nipples: Secondary | ICD-10-CM | POA: Diagnosis not present

## 2015-08-08 DIAGNOSIS — M1990 Unspecified osteoarthritis, unspecified site: Secondary | ICD-10-CM | POA: Diagnosis not present

## 2015-08-08 DIAGNOSIS — Z853 Personal history of malignant neoplasm of breast: Secondary | ICD-10-CM | POA: Diagnosis not present

## 2015-08-08 DIAGNOSIS — N3000 Acute cystitis without hematuria: Secondary | ICD-10-CM | POA: Diagnosis not present

## 2015-08-08 DIAGNOSIS — M4802 Spinal stenosis, cervical region: Secondary | ICD-10-CM | POA: Diagnosis not present

## 2015-08-08 DIAGNOSIS — I10 Essential (primary) hypertension: Secondary | ICD-10-CM | POA: Diagnosis not present

## 2015-08-08 DIAGNOSIS — B964 Proteus (mirabilis) (morganii) as the cause of diseases classified elsewhere: Secondary | ICD-10-CM | POA: Diagnosis not present

## 2015-08-08 DIAGNOSIS — Z4789 Encounter for other orthopedic aftercare: Secondary | ICD-10-CM | POA: Diagnosis not present

## 2015-08-11 DIAGNOSIS — Z853 Personal history of malignant neoplasm of breast: Secondary | ICD-10-CM | POA: Diagnosis not present

## 2015-08-11 DIAGNOSIS — J449 Chronic obstructive pulmonary disease, unspecified: Secondary | ICD-10-CM | POA: Diagnosis not present

## 2015-08-11 DIAGNOSIS — M4712 Other spondylosis with myelopathy, cervical region: Secondary | ICD-10-CM | POA: Diagnosis not present

## 2015-08-11 DIAGNOSIS — N3000 Acute cystitis without hematuria: Secondary | ICD-10-CM | POA: Diagnosis not present

## 2015-08-11 DIAGNOSIS — M1990 Unspecified osteoarthritis, unspecified site: Secondary | ICD-10-CM | POA: Diagnosis not present

## 2015-08-11 DIAGNOSIS — B964 Proteus (mirabilis) (morganii) as the cause of diseases classified elsewhere: Secondary | ICD-10-CM | POA: Diagnosis not present

## 2015-08-11 DIAGNOSIS — Z4789 Encounter for other orthopedic aftercare: Secondary | ICD-10-CM | POA: Diagnosis not present

## 2015-08-11 DIAGNOSIS — M4802 Spinal stenosis, cervical region: Secondary | ICD-10-CM | POA: Diagnosis not present

## 2015-08-11 DIAGNOSIS — I10 Essential (primary) hypertension: Secondary | ICD-10-CM | POA: Diagnosis not present

## 2015-08-11 DIAGNOSIS — Z9013 Acquired absence of bilateral breasts and nipples: Secondary | ICD-10-CM | POA: Diagnosis not present

## 2015-08-11 DIAGNOSIS — R7303 Prediabetes: Secondary | ICD-10-CM | POA: Diagnosis not present

## 2015-08-12 NOTE — Progress Notes (Signed)
Order(s) created erroneously. Erroneous order ID: [id number]  Order moved by: DORR, SUSAN Y  Order move date/time: 08/12/2015 11:20 AM  Source Patient: [id number]  Source Contact: 06/24/2015  Destination Patient: [id number]  Destination Contact: 03/03/2015 

## 2015-08-13 DIAGNOSIS — B964 Proteus (mirabilis) (morganii) as the cause of diseases classified elsewhere: Secondary | ICD-10-CM | POA: Diagnosis not present

## 2015-08-13 DIAGNOSIS — M1990 Unspecified osteoarthritis, unspecified site: Secondary | ICD-10-CM | POA: Diagnosis not present

## 2015-08-13 DIAGNOSIS — M4712 Other spondylosis with myelopathy, cervical region: Secondary | ICD-10-CM | POA: Diagnosis not present

## 2015-08-13 DIAGNOSIS — I10 Essential (primary) hypertension: Secondary | ICD-10-CM | POA: Diagnosis not present

## 2015-08-13 DIAGNOSIS — Z4789 Encounter for other orthopedic aftercare: Secondary | ICD-10-CM | POA: Diagnosis not present

## 2015-08-13 DIAGNOSIS — R7303 Prediabetes: Secondary | ICD-10-CM | POA: Diagnosis not present

## 2015-08-13 DIAGNOSIS — Z853 Personal history of malignant neoplasm of breast: Secondary | ICD-10-CM | POA: Diagnosis not present

## 2015-08-13 DIAGNOSIS — M4802 Spinal stenosis, cervical region: Secondary | ICD-10-CM | POA: Diagnosis not present

## 2015-08-13 DIAGNOSIS — Z9013 Acquired absence of bilateral breasts and nipples: Secondary | ICD-10-CM | POA: Diagnosis not present

## 2015-08-13 DIAGNOSIS — J449 Chronic obstructive pulmonary disease, unspecified: Secondary | ICD-10-CM | POA: Diagnosis not present

## 2015-08-13 DIAGNOSIS — N3000 Acute cystitis without hematuria: Secondary | ICD-10-CM | POA: Diagnosis not present

## 2015-08-14 DIAGNOSIS — M4802 Spinal stenosis, cervical region: Secondary | ICD-10-CM | POA: Diagnosis not present

## 2015-08-14 DIAGNOSIS — I10 Essential (primary) hypertension: Secondary | ICD-10-CM | POA: Diagnosis not present

## 2015-08-14 DIAGNOSIS — Z853 Personal history of malignant neoplasm of breast: Secondary | ICD-10-CM | POA: Diagnosis not present

## 2015-08-14 DIAGNOSIS — Z4789 Encounter for other orthopedic aftercare: Secondary | ICD-10-CM | POA: Diagnosis not present

## 2015-08-14 DIAGNOSIS — Z9013 Acquired absence of bilateral breasts and nipples: Secondary | ICD-10-CM | POA: Diagnosis not present

## 2015-08-14 DIAGNOSIS — M4712 Other spondylosis with myelopathy, cervical region: Secondary | ICD-10-CM | POA: Diagnosis not present

## 2015-08-14 DIAGNOSIS — R7303 Prediabetes: Secondary | ICD-10-CM | POA: Diagnosis not present

## 2015-08-14 DIAGNOSIS — N3000 Acute cystitis without hematuria: Secondary | ICD-10-CM | POA: Diagnosis not present

## 2015-08-14 DIAGNOSIS — M1990 Unspecified osteoarthritis, unspecified site: Secondary | ICD-10-CM | POA: Diagnosis not present

## 2015-08-14 DIAGNOSIS — B964 Proteus (mirabilis) (morganii) as the cause of diseases classified elsewhere: Secondary | ICD-10-CM | POA: Diagnosis not present

## 2015-08-14 DIAGNOSIS — J449 Chronic obstructive pulmonary disease, unspecified: Secondary | ICD-10-CM | POA: Diagnosis not present

## 2015-08-15 DIAGNOSIS — M4712 Other spondylosis with myelopathy, cervical region: Secondary | ICD-10-CM | POA: Diagnosis not present

## 2015-08-15 DIAGNOSIS — Z853 Personal history of malignant neoplasm of breast: Secondary | ICD-10-CM | POA: Diagnosis not present

## 2015-08-15 DIAGNOSIS — Z9013 Acquired absence of bilateral breasts and nipples: Secondary | ICD-10-CM | POA: Diagnosis not present

## 2015-08-15 DIAGNOSIS — R7303 Prediabetes: Secondary | ICD-10-CM | POA: Diagnosis not present

## 2015-08-15 DIAGNOSIS — Z4789 Encounter for other orthopedic aftercare: Secondary | ICD-10-CM | POA: Diagnosis not present

## 2015-08-15 DIAGNOSIS — M4802 Spinal stenosis, cervical region: Secondary | ICD-10-CM | POA: Diagnosis not present

## 2015-08-15 DIAGNOSIS — B964 Proteus (mirabilis) (morganii) as the cause of diseases classified elsewhere: Secondary | ICD-10-CM | POA: Diagnosis not present

## 2015-08-15 DIAGNOSIS — N3000 Acute cystitis without hematuria: Secondary | ICD-10-CM | POA: Diagnosis not present

## 2015-08-15 DIAGNOSIS — I10 Essential (primary) hypertension: Secondary | ICD-10-CM | POA: Diagnosis not present

## 2015-08-15 DIAGNOSIS — J449 Chronic obstructive pulmonary disease, unspecified: Secondary | ICD-10-CM | POA: Diagnosis not present

## 2015-08-15 DIAGNOSIS — M1990 Unspecified osteoarthritis, unspecified site: Secondary | ICD-10-CM | POA: Diagnosis not present

## 2015-08-18 DIAGNOSIS — Z4789 Encounter for other orthopedic aftercare: Secondary | ICD-10-CM | POA: Diagnosis not present

## 2015-08-18 DIAGNOSIS — M1990 Unspecified osteoarthritis, unspecified site: Secondary | ICD-10-CM | POA: Diagnosis not present

## 2015-08-18 DIAGNOSIS — M4802 Spinal stenosis, cervical region: Secondary | ICD-10-CM | POA: Diagnosis not present

## 2015-08-18 DIAGNOSIS — N3000 Acute cystitis without hematuria: Secondary | ICD-10-CM | POA: Diagnosis not present

## 2015-08-18 DIAGNOSIS — J449 Chronic obstructive pulmonary disease, unspecified: Secondary | ICD-10-CM | POA: Diagnosis not present

## 2015-08-18 DIAGNOSIS — I10 Essential (primary) hypertension: Secondary | ICD-10-CM | POA: Diagnosis not present

## 2015-08-18 DIAGNOSIS — Z9013 Acquired absence of bilateral breasts and nipples: Secondary | ICD-10-CM | POA: Diagnosis not present

## 2015-08-18 DIAGNOSIS — B964 Proteus (mirabilis) (morganii) as the cause of diseases classified elsewhere: Secondary | ICD-10-CM | POA: Diagnosis not present

## 2015-08-18 DIAGNOSIS — M4712 Other spondylosis with myelopathy, cervical region: Secondary | ICD-10-CM | POA: Diagnosis not present

## 2015-08-18 DIAGNOSIS — R7303 Prediabetes: Secondary | ICD-10-CM | POA: Diagnosis not present

## 2015-08-18 DIAGNOSIS — Z853 Personal history of malignant neoplasm of breast: Secondary | ICD-10-CM | POA: Diagnosis not present

## 2015-08-20 DIAGNOSIS — J449 Chronic obstructive pulmonary disease, unspecified: Secondary | ICD-10-CM | POA: Diagnosis not present

## 2015-08-20 DIAGNOSIS — M1990 Unspecified osteoarthritis, unspecified site: Secondary | ICD-10-CM | POA: Diagnosis not present

## 2015-08-20 DIAGNOSIS — M4802 Spinal stenosis, cervical region: Secondary | ICD-10-CM | POA: Diagnosis not present

## 2015-08-20 DIAGNOSIS — Z9013 Acquired absence of bilateral breasts and nipples: Secondary | ICD-10-CM | POA: Diagnosis not present

## 2015-08-20 DIAGNOSIS — R7303 Prediabetes: Secondary | ICD-10-CM | POA: Diagnosis not present

## 2015-08-20 DIAGNOSIS — N3000 Acute cystitis without hematuria: Secondary | ICD-10-CM | POA: Diagnosis not present

## 2015-08-20 DIAGNOSIS — I10 Essential (primary) hypertension: Secondary | ICD-10-CM | POA: Diagnosis not present

## 2015-08-20 DIAGNOSIS — B964 Proteus (mirabilis) (morganii) as the cause of diseases classified elsewhere: Secondary | ICD-10-CM | POA: Diagnosis not present

## 2015-08-20 DIAGNOSIS — Z4789 Encounter for other orthopedic aftercare: Secondary | ICD-10-CM | POA: Diagnosis not present

## 2015-08-20 DIAGNOSIS — Z853 Personal history of malignant neoplasm of breast: Secondary | ICD-10-CM | POA: Diagnosis not present

## 2015-08-20 DIAGNOSIS — M4712 Other spondylosis with myelopathy, cervical region: Secondary | ICD-10-CM | POA: Diagnosis not present

## 2015-08-21 DIAGNOSIS — N3000 Acute cystitis without hematuria: Secondary | ICD-10-CM | POA: Diagnosis not present

## 2015-08-21 DIAGNOSIS — B964 Proteus (mirabilis) (morganii) as the cause of diseases classified elsewhere: Secondary | ICD-10-CM | POA: Diagnosis not present

## 2015-08-21 DIAGNOSIS — R7303 Prediabetes: Secondary | ICD-10-CM | POA: Diagnosis not present

## 2015-08-21 DIAGNOSIS — Z9013 Acquired absence of bilateral breasts and nipples: Secondary | ICD-10-CM | POA: Diagnosis not present

## 2015-08-21 DIAGNOSIS — J449 Chronic obstructive pulmonary disease, unspecified: Secondary | ICD-10-CM | POA: Diagnosis not present

## 2015-08-21 DIAGNOSIS — I10 Essential (primary) hypertension: Secondary | ICD-10-CM | POA: Diagnosis not present

## 2015-08-21 DIAGNOSIS — M1990 Unspecified osteoarthritis, unspecified site: Secondary | ICD-10-CM | POA: Diagnosis not present

## 2015-08-21 DIAGNOSIS — M4802 Spinal stenosis, cervical region: Secondary | ICD-10-CM | POA: Diagnosis not present

## 2015-08-21 DIAGNOSIS — Z4789 Encounter for other orthopedic aftercare: Secondary | ICD-10-CM | POA: Diagnosis not present

## 2015-08-21 DIAGNOSIS — M4712 Other spondylosis with myelopathy, cervical region: Secondary | ICD-10-CM | POA: Diagnosis not present

## 2015-08-21 DIAGNOSIS — Z853 Personal history of malignant neoplasm of breast: Secondary | ICD-10-CM | POA: Diagnosis not present

## 2015-08-22 ENCOUNTER — Encounter: Payer: Self-pay | Admitting: Family Medicine

## 2015-08-22 ENCOUNTER — Ambulatory Visit (INDEPENDENT_AMBULATORY_CARE_PROVIDER_SITE_OTHER): Payer: Medicare Other | Admitting: Family Medicine

## 2015-08-22 VITALS — BP 120/76 | HR 70 | Temp 98.2°F | Ht 62.0 in | Wt 168.2 lb

## 2015-08-22 DIAGNOSIS — M545 Low back pain, unspecified: Secondary | ICD-10-CM

## 2015-08-22 DIAGNOSIS — N390 Urinary tract infection, site not specified: Secondary | ICD-10-CM | POA: Diagnosis not present

## 2015-08-22 LAB — POC URINALSYSI DIPSTICK (AUTOMATED)
Bilirubin, UA: NEGATIVE
Blood, UA: NEGATIVE
GLUCOSE UA: NEGATIVE
Ketones, UA: NEGATIVE
NITRITE UA: NEGATIVE
PROTEIN UA: NEGATIVE
SPEC GRAV UA: 1.025
UROBILINOGEN UA: 0.2
pH, UA: 6

## 2015-08-22 LAB — POCT UA - MICROSCOPIC ONLY

## 2015-08-22 NOTE — Patient Instructions (Addendum)
Push water. We will call with urine culture results.   Use heat on low back, start gentle stretching with PT of low back.

## 2015-08-22 NOTE — Progress Notes (Signed)
   Subjective:    Patient ID: Diana [name], female    DOB: 1923-10-10, 82 y.o.   MRN: [id number]  HPI  82 year old female with history of lumbar back pain, recent cervical decompression for cervical stenosis ( 06/26/2015) presents with new onset back pain.   She reports right mid back x 1 5 days. She has PT. Slight abd pressure lower, no frequency, no urgency, no dysuria, no blood in hematuria. No fever. No N/V. No confusion.  She was treated after surgery for UTI with nitrofurantoin in rehab.  Social History /Family History/Past Medical History reviewed and updated if needed.  Review of Systems  Constitutional: Negative for fever and fatigue.  HENT: Negative for ear pain.   Eyes: Negative for pain.  Respiratory: Negative for chest tightness and shortness of breath.   Cardiovascular: Negative for chest pain, palpitations and leg swelling.  Gastrointestinal: Negative for abdominal pain.  Genitourinary: Negative for dysuria.       Objective:   Physical Exam  Constitutional: Vital signs are normal. She appears well-developed and well-nourished. She is cooperative.  Non-toxic appearance. She does not appear ill. No distress.  HENT:  Head: Normocephalic.  Right Ear: Hearing, tympanic membrane, external ear and ear canal normal. Tympanic membrane is not erythematous, not retracted and not bulging.  Left Ear: Hearing, tympanic membrane, external ear and ear canal normal. Tympanic membrane is not erythematous, not retracted and not bulging.  Nose: No mucosal edema or rhinorrhea. Right sinus exhibits no maxillary sinus tenderness and no frontal sinus tenderness. Left sinus exhibits no maxillary sinus tenderness and no frontal sinus tenderness.  Mouth/Throat: Uvula is midline, oropharynx is clear and moist and mucous membranes are normal.  Eyes: Conjunctivae, EOM and lids are normal. Pupils are equal, round, and reactive to light. Lids are everted and swept, no foreign bodies found.  Neck:  Trachea normal and normal range of motion. Neck supple. Carotid bruit is not present. No thyroid mass and no thyromegaly present.  Cardiovascular: Normal rate, regular rhythm, S1 normal, S2 normal, normal heart sounds, intact distal pulses and normal pulses.  Exam reveals no gallop and no friction rub.   No murmur heard. Pulmonary/Chest: Effort normal and breath sounds normal. No tachypnea. No respiratory distress. She has no decreased breath sounds. She has no wheezes. She has no rhonchi. She has no rales.  Abdominal: Soft. Normal appearance and bowel sounds are normal. There is no hepatosplenomegaly. There is no tenderness. There is no CVA tenderness. No hernia.  Musculoskeletal:       Lumbar back: She exhibits decreased range of motion and tenderness. She exhibits no bony tenderness.       Back:  Neurological: She is alert.  Skin: Skin is warm, dry and intact. No rash noted.  Psychiatric: Her speech is normal and behavior is normal. Judgment and thought content normal. Her mood appears not anxious. Cognition and memory are normal. She does not exhibit a depressed mood.          Assessment & Plan:

## 2015-08-22 NOTE — Addendum Note (Signed)
Addended by: Carter Kitten on: 08/22/2015 04:02 PM   Modules accepted: Orders

## 2015-08-22 NOTE — Progress Notes (Signed)
Pre visit review using our clinic review tool, if applicable. No additional management support is needed unless otherwise documented below in the visit note. 

## 2015-08-22 NOTE — Assessment & Plan Note (Signed)
Contaminated UA, pt high risk for infection. Will send for culture.  Push fluids.  Most likely MSK strain.. Start gentle stretching of low back and tylenol prn pain.

## 2015-08-23 DIAGNOSIS — M4712 Other spondylosis with myelopathy, cervical region: Secondary | ICD-10-CM | POA: Diagnosis not present

## 2015-08-23 DIAGNOSIS — B964 Proteus (mirabilis) (morganii) as the cause of diseases classified elsewhere: Secondary | ICD-10-CM | POA: Diagnosis not present

## 2015-08-23 DIAGNOSIS — Z9013 Acquired absence of bilateral breasts and nipples: Secondary | ICD-10-CM | POA: Diagnosis not present

## 2015-08-23 DIAGNOSIS — M4802 Spinal stenosis, cervical region: Secondary | ICD-10-CM | POA: Diagnosis not present

## 2015-08-23 DIAGNOSIS — R7303 Prediabetes: Secondary | ICD-10-CM | POA: Diagnosis not present

## 2015-08-23 DIAGNOSIS — I10 Essential (primary) hypertension: Secondary | ICD-10-CM | POA: Diagnosis not present

## 2015-08-23 DIAGNOSIS — M1990 Unspecified osteoarthritis, unspecified site: Secondary | ICD-10-CM | POA: Diagnosis not present

## 2015-08-23 DIAGNOSIS — J449 Chronic obstructive pulmonary disease, unspecified: Secondary | ICD-10-CM | POA: Diagnosis not present

## 2015-08-23 DIAGNOSIS — Z4789 Encounter for other orthopedic aftercare: Secondary | ICD-10-CM | POA: Diagnosis not present

## 2015-08-23 DIAGNOSIS — N3000 Acute cystitis without hematuria: Secondary | ICD-10-CM | POA: Diagnosis not present

## 2015-08-23 DIAGNOSIS — Z853 Personal history of malignant neoplasm of breast: Secondary | ICD-10-CM | POA: Diagnosis not present

## 2015-08-23 LAB — URINE CULTURE

## 2015-08-26 DIAGNOSIS — M1990 Unspecified osteoarthritis, unspecified site: Secondary | ICD-10-CM | POA: Diagnosis not present

## 2015-08-26 DIAGNOSIS — R7303 Prediabetes: Secondary | ICD-10-CM | POA: Diagnosis not present

## 2015-08-26 DIAGNOSIS — Z9013 Acquired absence of bilateral breasts and nipples: Secondary | ICD-10-CM | POA: Diagnosis not present

## 2015-08-26 DIAGNOSIS — M4712 Other spondylosis with myelopathy, cervical region: Secondary | ICD-10-CM | POA: Diagnosis not present

## 2015-08-26 DIAGNOSIS — N3000 Acute cystitis without hematuria: Secondary | ICD-10-CM | POA: Diagnosis not present

## 2015-08-26 DIAGNOSIS — B964 Proteus (mirabilis) (morganii) as the cause of diseases classified elsewhere: Secondary | ICD-10-CM | POA: Diagnosis not present

## 2015-08-26 DIAGNOSIS — Z853 Personal history of malignant neoplasm of breast: Secondary | ICD-10-CM | POA: Diagnosis not present

## 2015-08-26 DIAGNOSIS — Z4789 Encounter for other orthopedic aftercare: Secondary | ICD-10-CM | POA: Diagnosis not present

## 2015-08-26 DIAGNOSIS — J449 Chronic obstructive pulmonary disease, unspecified: Secondary | ICD-10-CM | POA: Diagnosis not present

## 2015-08-26 DIAGNOSIS — M4802 Spinal stenosis, cervical region: Secondary | ICD-10-CM | POA: Diagnosis not present

## 2015-08-26 DIAGNOSIS — I10 Essential (primary) hypertension: Secondary | ICD-10-CM | POA: Diagnosis not present

## 2015-08-27 DIAGNOSIS — I10 Essential (primary) hypertension: Secondary | ICD-10-CM | POA: Diagnosis not present

## 2015-08-27 DIAGNOSIS — N3000 Acute cystitis without hematuria: Secondary | ICD-10-CM | POA: Diagnosis not present

## 2015-08-27 DIAGNOSIS — J449 Chronic obstructive pulmonary disease, unspecified: Secondary | ICD-10-CM | POA: Diagnosis not present

## 2015-08-27 DIAGNOSIS — R7303 Prediabetes: Secondary | ICD-10-CM | POA: Diagnosis not present

## 2015-08-27 DIAGNOSIS — Z9013 Acquired absence of bilateral breasts and nipples: Secondary | ICD-10-CM | POA: Diagnosis not present

## 2015-08-27 DIAGNOSIS — Z853 Personal history of malignant neoplasm of breast: Secondary | ICD-10-CM | POA: Diagnosis not present

## 2015-08-27 DIAGNOSIS — M1990 Unspecified osteoarthritis, unspecified site: Secondary | ICD-10-CM | POA: Diagnosis not present

## 2015-08-27 DIAGNOSIS — Z4789 Encounter for other orthopedic aftercare: Secondary | ICD-10-CM | POA: Diagnosis not present

## 2015-08-27 DIAGNOSIS — M4802 Spinal stenosis, cervical region: Secondary | ICD-10-CM | POA: Diagnosis not present

## 2015-08-27 DIAGNOSIS — M4712 Other spondylosis with myelopathy, cervical region: Secondary | ICD-10-CM | POA: Diagnosis not present

## 2015-08-27 DIAGNOSIS — B964 Proteus (mirabilis) (morganii) as the cause of diseases classified elsewhere: Secondary | ICD-10-CM | POA: Diagnosis not present

## 2015-08-28 DIAGNOSIS — I10 Essential (primary) hypertension: Secondary | ICD-10-CM | POA: Diagnosis not present

## 2015-08-28 DIAGNOSIS — J449 Chronic obstructive pulmonary disease, unspecified: Secondary | ICD-10-CM | POA: Diagnosis not present

## 2015-08-28 DIAGNOSIS — M4802 Spinal stenosis, cervical region: Secondary | ICD-10-CM | POA: Diagnosis not present

## 2015-08-28 DIAGNOSIS — R7303 Prediabetes: Secondary | ICD-10-CM | POA: Diagnosis not present

## 2015-08-28 DIAGNOSIS — N3000 Acute cystitis without hematuria: Secondary | ICD-10-CM | POA: Diagnosis not present

## 2015-08-28 DIAGNOSIS — M4712 Other spondylosis with myelopathy, cervical region: Secondary | ICD-10-CM | POA: Diagnosis not present

## 2015-08-28 DIAGNOSIS — M1990 Unspecified osteoarthritis, unspecified site: Secondary | ICD-10-CM | POA: Diagnosis not present

## 2015-08-28 DIAGNOSIS — Z853 Personal history of malignant neoplasm of breast: Secondary | ICD-10-CM | POA: Diagnosis not present

## 2015-08-28 DIAGNOSIS — Z9013 Acquired absence of bilateral breasts and nipples: Secondary | ICD-10-CM | POA: Diagnosis not present

## 2015-08-28 DIAGNOSIS — Z4789 Encounter for other orthopedic aftercare: Secondary | ICD-10-CM | POA: Diagnosis not present

## 2015-08-28 DIAGNOSIS — B964 Proteus (mirabilis) (morganii) as the cause of diseases classified elsewhere: Secondary | ICD-10-CM | POA: Diagnosis not present

## 2015-09-02 DIAGNOSIS — J449 Chronic obstructive pulmonary disease, unspecified: Secondary | ICD-10-CM | POA: Diagnosis not present

## 2015-09-02 DIAGNOSIS — M4802 Spinal stenosis, cervical region: Secondary | ICD-10-CM | POA: Diagnosis not present

## 2015-09-02 DIAGNOSIS — Z9013 Acquired absence of bilateral breasts and nipples: Secondary | ICD-10-CM | POA: Diagnosis not present

## 2015-09-02 DIAGNOSIS — N3000 Acute cystitis without hematuria: Secondary | ICD-10-CM | POA: Diagnosis not present

## 2015-09-02 DIAGNOSIS — R7303 Prediabetes: Secondary | ICD-10-CM | POA: Diagnosis not present

## 2015-09-02 DIAGNOSIS — B964 Proteus (mirabilis) (morganii) as the cause of diseases classified elsewhere: Secondary | ICD-10-CM | POA: Diagnosis not present

## 2015-09-02 DIAGNOSIS — M1990 Unspecified osteoarthritis, unspecified site: Secondary | ICD-10-CM | POA: Diagnosis not present

## 2015-09-02 DIAGNOSIS — Z853 Personal history of malignant neoplasm of breast: Secondary | ICD-10-CM | POA: Diagnosis not present

## 2015-09-02 DIAGNOSIS — Z4789 Encounter for other orthopedic aftercare: Secondary | ICD-10-CM | POA: Diagnosis not present

## 2015-09-02 DIAGNOSIS — M4712 Other spondylosis with myelopathy, cervical region: Secondary | ICD-10-CM | POA: Diagnosis not present

## 2015-09-02 DIAGNOSIS — I10 Essential (primary) hypertension: Secondary | ICD-10-CM | POA: Diagnosis not present

## 2015-09-04 DIAGNOSIS — Z9013 Acquired absence of bilateral breasts and nipples: Secondary | ICD-10-CM | POA: Diagnosis not present

## 2015-09-04 DIAGNOSIS — N3000 Acute cystitis without hematuria: Secondary | ICD-10-CM | POA: Diagnosis not present

## 2015-09-04 DIAGNOSIS — R7303 Prediabetes: Secondary | ICD-10-CM | POA: Diagnosis not present

## 2015-09-04 DIAGNOSIS — M4802 Spinal stenosis, cervical region: Secondary | ICD-10-CM | POA: Diagnosis not present

## 2015-09-04 DIAGNOSIS — B964 Proteus (mirabilis) (morganii) as the cause of diseases classified elsewhere: Secondary | ICD-10-CM | POA: Diagnosis not present

## 2015-09-04 DIAGNOSIS — M1990 Unspecified osteoarthritis, unspecified site: Secondary | ICD-10-CM | POA: Diagnosis not present

## 2015-09-04 DIAGNOSIS — J449 Chronic obstructive pulmonary disease, unspecified: Secondary | ICD-10-CM | POA: Diagnosis not present

## 2015-09-04 DIAGNOSIS — Z853 Personal history of malignant neoplasm of breast: Secondary | ICD-10-CM | POA: Diagnosis not present

## 2015-09-04 DIAGNOSIS — Z4789 Encounter for other orthopedic aftercare: Secondary | ICD-10-CM | POA: Diagnosis not present

## 2015-09-04 DIAGNOSIS — I10 Essential (primary) hypertension: Secondary | ICD-10-CM | POA: Diagnosis not present

## 2015-09-04 DIAGNOSIS — M4712 Other spondylosis with myelopathy, cervical region: Secondary | ICD-10-CM | POA: Diagnosis not present

## 2015-09-08 DIAGNOSIS — J449 Chronic obstructive pulmonary disease, unspecified: Secondary | ICD-10-CM | POA: Diagnosis not present

## 2015-09-08 DIAGNOSIS — Z4789 Encounter for other orthopedic aftercare: Secondary | ICD-10-CM | POA: Diagnosis not present

## 2015-09-08 DIAGNOSIS — M1990 Unspecified osteoarthritis, unspecified site: Secondary | ICD-10-CM | POA: Diagnosis not present

## 2015-09-08 DIAGNOSIS — Z9013 Acquired absence of bilateral breasts and nipples: Secondary | ICD-10-CM | POA: Diagnosis not present

## 2015-09-08 DIAGNOSIS — R7303 Prediabetes: Secondary | ICD-10-CM | POA: Diagnosis not present

## 2015-09-08 DIAGNOSIS — M4712 Other spondylosis with myelopathy, cervical region: Secondary | ICD-10-CM | POA: Diagnosis not present

## 2015-09-08 DIAGNOSIS — Z853 Personal history of malignant neoplasm of breast: Secondary | ICD-10-CM | POA: Diagnosis not present

## 2015-09-08 DIAGNOSIS — B964 Proteus (mirabilis) (morganii) as the cause of diseases classified elsewhere: Secondary | ICD-10-CM | POA: Diagnosis not present

## 2015-09-08 DIAGNOSIS — I10 Essential (primary) hypertension: Secondary | ICD-10-CM | POA: Diagnosis not present

## 2015-09-08 DIAGNOSIS — N3000 Acute cystitis without hematuria: Secondary | ICD-10-CM | POA: Diagnosis not present

## 2015-09-08 DIAGNOSIS — M4802 Spinal stenosis, cervical region: Secondary | ICD-10-CM | POA: Diagnosis not present

## 2015-09-11 ENCOUNTER — Other Ambulatory Visit: Payer: Self-pay | Admitting: Family Medicine

## 2015-09-11 NOTE — Telephone Encounter (Signed)
Last office visit 08/22/2015.  Last Vit D level 06/03/2015.  Refill?

## 2015-09-12 DIAGNOSIS — N3000 Acute cystitis without hematuria: Secondary | ICD-10-CM | POA: Diagnosis not present

## 2015-09-12 DIAGNOSIS — R7303 Prediabetes: Secondary | ICD-10-CM | POA: Diagnosis not present

## 2015-09-12 DIAGNOSIS — J449 Chronic obstructive pulmonary disease, unspecified: Secondary | ICD-10-CM | POA: Diagnosis not present

## 2015-09-12 DIAGNOSIS — M1990 Unspecified osteoarthritis, unspecified site: Secondary | ICD-10-CM | POA: Diagnosis not present

## 2015-09-12 DIAGNOSIS — Z4789 Encounter for other orthopedic aftercare: Secondary | ICD-10-CM | POA: Diagnosis not present

## 2015-09-12 DIAGNOSIS — B964 Proteus (mirabilis) (morganii) as the cause of diseases classified elsewhere: Secondary | ICD-10-CM | POA: Diagnosis not present

## 2015-09-12 DIAGNOSIS — M4802 Spinal stenosis, cervical region: Secondary | ICD-10-CM | POA: Diagnosis not present

## 2015-09-12 DIAGNOSIS — I10 Essential (primary) hypertension: Secondary | ICD-10-CM | POA: Diagnosis not present

## 2015-09-12 DIAGNOSIS — Z9013 Acquired absence of bilateral breasts and nipples: Secondary | ICD-10-CM | POA: Diagnosis not present

## 2015-09-12 DIAGNOSIS — Z853 Personal history of malignant neoplasm of breast: Secondary | ICD-10-CM | POA: Diagnosis not present

## 2015-09-12 DIAGNOSIS — M4712 Other spondylosis with myelopathy, cervical region: Secondary | ICD-10-CM | POA: Diagnosis not present

## 2015-10-08 ENCOUNTER — Other Ambulatory Visit: Payer: Self-pay | Admitting: Family Medicine

## 2015-10-10 DIAGNOSIS — H26493 Other secondary cataract, bilateral: Secondary | ICD-10-CM | POA: Diagnosis not present

## 2015-10-10 DIAGNOSIS — H43813 Vitreous degeneration, bilateral: Secondary | ICD-10-CM | POA: Diagnosis not present

## 2015-10-10 DIAGNOSIS — H5211 Myopia, right eye: Secondary | ICD-10-CM | POA: Diagnosis not present

## 2015-10-10 DIAGNOSIS — H04123 Dry eye syndrome of bilateral lacrimal glands: Secondary | ICD-10-CM | POA: Diagnosis not present

## 2015-11-07 ENCOUNTER — Ambulatory Visit (INDEPENDENT_AMBULATORY_CARE_PROVIDER_SITE_OTHER): Payer: Medicare Other | Admitting: Family Medicine

## 2015-11-07 ENCOUNTER — Encounter: Payer: Self-pay | Admitting: Family Medicine

## 2015-11-07 VITALS — BP 106/70 | HR 72 | Temp 98.1°F | Ht 62.0 in | Wt 168.8 lb

## 2015-11-07 DIAGNOSIS — N3000 Acute cystitis without hematuria: Secondary | ICD-10-CM | POA: Diagnosis not present

## 2015-11-07 DIAGNOSIS — R300 Dysuria: Secondary | ICD-10-CM

## 2015-11-07 LAB — POC URINALSYSI DIPSTICK (AUTOMATED)
Bilirubin, UA: NEGATIVE
Glucose, UA: NEGATIVE
KETONES UA: NEGATIVE
Nitrite, UA: NEGATIVE
PH UA: 6
SPEC GRAV UA: 1.02
Urobilinogen, UA: 0.2

## 2015-11-07 MED ORDER — NITROFURANTOIN MONOHYD MACRO 100 MG PO CAPS: 100.0000 mg | ORAL_CAPSULE | Freq: Two times a day (BID) | ORAL | Status: DC

## 2015-11-07 NOTE — Progress Notes (Signed)
Dr. Frederico Hamman T. Tadao Emig, MD, [facility] [place], [address] Phone: [phone] Fax: [phone]  11/07/2015  Patient: Diana [name], MRN: [id number], DOB: [birth year], 82 y.o.  Primary Physician:  Eliezer Lofts, MD   Chief Complaint  Patient presents with  . Dysuria  . Urinary Frequency  . Back Pain   Subjective:   This 82 y.o. female patient presents with burning, urgency. No vaginal discharge or external irritation.  No STD exposure. No abd pain, no flank pain.  The PMH, PSH, Social History, Family History, Medications, and allergies have been reviewed in [facility], and have been updated if relevant.  Patient Active Problem List   Diagnosis Date Noted  . Right-sided low back pain without sciatica 08/22/2015  . Spinal stenosis in cervical region 08/07/2015  . UTI (urinary tract infection) 08/07/2015  . Abnormality of gait 07/15/2015  . Cervical spondylosis with myelopathy 06/26/2015  . Systolic murmur [id number]  . Weakness generalized 06/03/2015  . BPV (benign positional vertigo) 01/29/2015  . Poor balance 10/07/2011  . Palpitations 04/28/2011  . Breast cancer, right breast ([place]) 03/29/2011  . Vitamin D deficiency 10/17/2009  . Prediabetes 10/17/2009  . COPD (chronic obstructive pulmonary disease) ([place]) 12/19/2007  . INSOMNIA, CHRONIC 10/25/2007  . BACK PAIN, LUMBAR 03/01/2007  . Hypothyroidism 01/18/2007  . HYPONATREMIA, MILD 01/18/2007  . Essential hypertension 01/18/2007    Past Medical History  Diagnosis Date  . Other abnormal glucose   . Unspecified vitamin D deficiency   . Urinary tract infection, site not specified   . Persistent disorder of initiating or maintaining sleep   . Pain in joint, shoulder region   . Palpitations   . Bacterial pneumonia, unspecified   . Candidiasis of mouth   . Unspecified viral infection, in conditions classified elsewhere and of unspecified site   . Lumbago   .  Unspecified essential hypertension   . Hyposmolality and/or hyponatremia   . Unspecified hypothyroidism   . Cancer , breast   . Encephalitis 1961  . Chronic airway obstruction, not elsewhere classified     son not aware of this  . Obstructive chronic bronchitis with exacerbation ([place])   . Restless leg syndrome   . Arthritis   . Family history of adverse reaction to anesthesia     son has nausea  . Heart murmur     has had it for years; very mild AS 05/2015 echo    Past Surgical History  Procedure Laterality Date  . Abi  04/19/05    Negative  . Venous doppler  04/19/05    LE normal  . Back mri  2005    mild bulging disks  . Mastectomy, radical    . Eye surgery Bilateral     Cataract surgery   . Hemorrhoid surgery    . Posterior cervical laminectomy N/A 06/26/2015    Procedure: Cervical three-four POSTERIOR CERVICAL LAMINECTOMY for decompression ;  Surgeon: Kevan Ny Ditty, MD;  Location: [facility];  Service: Neurosurgery;  Laterality: N/A;  C34 laminectomy    Social History   Social History  . Marital Status: Widowed    Spouse Name: N/A  . Number of Children: N/A  . Years of Education: N/A   Occupational History  . Not on file.   Social History Main Topics  . Smoking status: Former Smoker -- 0.50 packs/day for 42 years    Types: Cigarettes  . Smokeless tobacco: Never Used  .  Alcohol Use: No  . Drug Use: No  . Sexual Activity: Not on file   Other Topics Concern  . Not on file   Social History Narrative   Married x60 years      No regular exercise-uses cane to ambulate, unsteady on feet      Diet: Moderately healthy, a lot of desserts       Living will, HCPOA: son: FLORABELL VIOLETT    Family History  Problem Relation Age of Onset  . Rheum arthritis Father   . Stroke Mother   . Diabetes Mother   . Healthy Sister   . Healthy Sister   . Liver cancer      Aunt  . Stomach cancer      Aunt    Allergies  Allergen Reactions  . Meperidine Hcl Nausea  And Vomiting    Medication list reviewed and updated in full in [place].  GEN:  no fevers, chills. GI: No n/v/d, eating normally Otherwise, ROS is as per the HPI.  Objective:   Blood pressure 106/70, pulse 72, temperature 98.1 F (36.7 C), temperature source Oral, height 5\' 2"  (1.575 m), weight 168 lb 12 oz (76.544 kg).  GEN: WDWN, A&Ox4,NAD. Non-toxic HEENT: Atraumatc, normocephalic. CV: RRR, No M/G/R PULM: CTA B, No wheezes, crackles, or rhonchi ABD: S, NT, ND, +BS, no rebound. No CVAT. No suprapubic tenderness. EXT: No c/c/e  Objective Data: Results for orders placed or performed in visit on 11/07/15  POCT Urinalysis Dipstick (Automated)  Result Value Ref Range   Color, UA yellow    Clarity, UA hazy    Glucose, UA negative    Bilirubin, UA negative    Ketones, UA negative    Spec Grav, UA 1.020    Blood, UA large    pH, UA 6.0    Protein, UA trace    Urobilinogen, UA 0.2    Nitrite, UA negative    Leukocytes, UA large (3+) (A) Negative    Assessment and Plan:   Acute cystitis without hematuria  Dysuria - Plan: POCT Urinalysis Dipstick (Automated), Urine culture  Rx with ABX as below. Drink plenty of fluids and supportive care.  Follow-up: No Follow-up on file.  New Prescriptions   NITROFURANTOIN, MACROCRYSTAL-MONOHYDRATE, (MACROBID) 100 MG CAPSULE    Take 1 capsule (100 mg total) by mouth 2 (two) times daily.   Modified Medications   No medications on file   Orders Placed This Encounter  Procedures  . Urine culture  . POCT Urinalysis Dipstick (Automated)    Signed,  Darlette Dubow T. Timmy Bubeck, MD   Patient's Medications  New Prescriptions   NITROFURANTOIN, MACROCRYSTAL-MONOHYDRATE, (MACROBID) 100 MG CAPSULE    Take 1 capsule (100 mg total) by mouth 2 (two) times daily.  Previous Medications   AMLODIPINE (NORVASC) 10 MG TABLET    TAKE 1 TABLET BY MOUTH DAILY   DOCUSATE SODIUM (COLACE) 100 MG CAPSULE    Take 1 capsule (100 mg total) by mouth 2  (two) times daily.   ERGOCALCIFEROL (VITAMIN D2) 50000 UNITS CAPSULE    Take 50,000 Units by mouth once a week.   FUROSEMIDE (LASIX) 40 MG TABLET    TAKE 1 TABLET BY MOUTH DAILY   LEVOTHYROXINE (SYNTHROID, LEVOTHROID) 100 MCG TABLET    TAKE 1 TABLET BY MOUTH DAILY   LOSARTAN (COZAAR) 100 MG TABLET    Take 100 mg by mouth daily.   METOPROLOL SUCCINATE (TOPROL-XL) 50 MG 24 HR TABLET    TAKE 1  TABLET BY MOUTH DAILY   VALSARTAN (DIOVAN) 160 MG TABLET    TAKE 1 TABLET BY MOUTH DAILY   VITAMIN D, ERGOCALCIFEROL, (DRISDOL) 50000 UNITS CAPS CAPSULE    TAKE 1 CAPSULE BY MOUTH ON THE SAME DAY EVERY WEEK AS DIRECTED.  Modified Medications   No medications on file  Discontinued Medications   No medications on file

## 2015-11-07 NOTE — Progress Notes (Signed)
Pre visit review using our clinic review tool, if applicable. No additional management support is needed unless otherwise documented below in the visit note. 

## 2015-11-10 LAB — URINE CULTURE: Colony Count: >=100000

## 2015-12-03 ENCOUNTER — Other Ambulatory Visit: Payer: Self-pay | Admitting: Family Medicine

## 2015-12-03 NOTE — Telephone Encounter (Signed)
Last office visit 11/07/15  LR  09/12/15 #12 Is it okay to refill medication?

## 2015-12-27 DIAGNOSIS — M5136 Other intervertebral disc degeneration, lumbar region: Secondary | ICD-10-CM | POA: Diagnosis not present

## 2015-12-27 DIAGNOSIS — M545 Low back pain: Secondary | ICD-10-CM | POA: Diagnosis not present

## 2016-01-01 DIAGNOSIS — M4806 Spinal stenosis, lumbar region: Secondary | ICD-10-CM | POA: Diagnosis not present

## 2016-01-07 ENCOUNTER — Other Ambulatory Visit: Payer: Self-pay | Admitting: *Deleted

## 2016-01-07 NOTE — Telephone Encounter (Signed)
Last office visit 11/07/2015 with Dr. Lorelei Pont.  Not on medication list.  Refill?

## 2016-01-08 NOTE — Telephone Encounter (Signed)
Diana [name] notified as instructed by telephone.  She states Dr. Lorelei Pont had prescribed that for her.  She states she can't sleep, that "her heart flies" probably due to worrying about things.  Wants to know if there is anything else Dr. Diona Browner could recommend to calm her down.

## 2016-01-08 NOTE — Telephone Encounter (Signed)
Can try trazodone for sleep and to relax at night. Has she tried this in past?

## 2016-01-08 NOTE — Addendum Note (Signed)
Addended by: Carter Kitten on: 01/08/2016 05:34 PM   Modules accepted: Orders

## 2016-01-08 NOTE — Telephone Encounter (Signed)
Clorazepate refill..Let pt know that this medication can cause sedation and falls in the elderly.  I know she has taken it in the past, But I believe it is unsafe for her to do so at age 82. No refills authorized.  If pt with concerns.. Have her make appt to discuss.

## 2016-01-09 ENCOUNTER — Other Ambulatory Visit: Payer: Self-pay | Admitting: Neurological Surgery

## 2016-01-09 DIAGNOSIS — G9519 Other vascular myelopathies: Secondary | ICD-10-CM

## 2016-01-09 DIAGNOSIS — M48062 Spinal stenosis, lumbar region with neurogenic claudication: Secondary | ICD-10-CM

## 2016-01-09 MED ORDER — TRAZODONE HCL 50 MG PO TABS: 25.0000 mg | ORAL_TABLET | Freq: Every evening | ORAL | 1 refills | Status: DC | PRN

## 2016-01-09 NOTE — Addendum Note (Signed)
Addended byEliezer Lofts E on: 01/09/2016 01:01 PM   Modules accepted: Orders

## 2016-01-09 NOTE — Telephone Encounter (Signed)
Spoke to pt. She has never tried trazodone that she can remember. She would like a rx for it.

## 2016-01-21 ENCOUNTER — Ambulatory Visit
Admission: RE | Admit: 2016-01-21 | Discharge: 2016-01-21 | Disposition: A | Discharge status: Home / Self Care | Payer: Medicare Other | Source: Ambulatory Visit | Attending: Neurological Surgery | Admitting: Neurological Surgery

## 2016-01-21 DIAGNOSIS — M47896 Other spondylosis, lumbar region: Secondary | ICD-10-CM | POA: Diagnosis not present

## 2016-01-21 DIAGNOSIS — G9519 Other vascular myelopathies: Secondary | ICD-10-CM

## 2016-01-21 DIAGNOSIS — M48062 Spinal stenosis, lumbar region with neurogenic claudication: Secondary | ICD-10-CM | POA: Insufficient documentation

## 2016-01-21 DIAGNOSIS — M1288 Other specific arthropathies, not elsewhere classified, other specified site: Secondary | ICD-10-CM | POA: Insufficient documentation

## 2016-01-21 DIAGNOSIS — M4826 Kissing spine, lumbar region: Secondary | ICD-10-CM | POA: Insufficient documentation

## 2016-01-21 DIAGNOSIS — M48061 Spinal stenosis, lumbar region without neurogenic claudication: Secondary | ICD-10-CM | POA: Diagnosis not present

## 2016-01-28 DIAGNOSIS — M48062 Spinal stenosis, lumbar region with neurogenic claudication: Secondary | ICD-10-CM | POA: Diagnosis not present

## 2016-01-28 DIAGNOSIS — I10 Essential (primary) hypertension: Secondary | ICD-10-CM | POA: Diagnosis not present

## 2016-02-06 ENCOUNTER — Other Ambulatory Visit: Payer: Self-pay | Admitting: Family Medicine

## 2016-02-24 ENCOUNTER — Other Ambulatory Visit: Payer: Self-pay | Admitting: Family Medicine

## 2016-02-24 NOTE — Telephone Encounter (Signed)
Diana [name],  Can you call and schedule Medicare Wellness visit with you and CPE with Dr. Diona Browner.

## 2016-02-24 NOTE — Telephone Encounter (Signed)
Have pt scheduled with leisa for AMW if she has not had and with me for follow up CPX.

## 2016-02-24 NOTE — Telephone Encounter (Signed)
Last office visit 11/07/2015 with Dr. Lorelei Pont.  Last CPE 12/29/2012.  Last refilled 08/06/2015 for #30 with 5 refills.  Ok to refill?

## 2016-03-06 ENCOUNTER — Other Ambulatory Visit: Payer: Self-pay | Admitting: Family Medicine

## 2016-04-23 ENCOUNTER — Ambulatory Visit (INDEPENDENT_AMBULATORY_CARE_PROVIDER_SITE_OTHER): Payer: Medicare Other | Admitting: Family Medicine

## 2016-04-23 ENCOUNTER — Encounter: Payer: Self-pay | Admitting: Family Medicine

## 2016-04-23 VITALS — BP 171/77 | HR 61 | Temp 97.6°F | Ht 63.0 in | Wt 173.5 lb

## 2016-04-23 DIAGNOSIS — E039 Hypothyroidism, unspecified: Secondary | ICD-10-CM

## 2016-04-23 DIAGNOSIS — J449 Chronic obstructive pulmonary disease, unspecified: Secondary | ICD-10-CM | POA: Diagnosis not present

## 2016-04-23 DIAGNOSIS — Z0000 Encounter for general adult medical examination without abnormal findings: Secondary | ICD-10-CM | POA: Diagnosis not present

## 2016-04-23 DIAGNOSIS — M4712 Other spondylosis with myelopathy, cervical region: Secondary | ICD-10-CM

## 2016-04-23 DIAGNOSIS — I359 Nonrheumatic aortic valve disorder, unspecified: Secondary | ICD-10-CM

## 2016-04-23 DIAGNOSIS — R7303 Prediabetes: Secondary | ICD-10-CM | POA: Diagnosis not present

## 2016-04-23 DIAGNOSIS — E559 Vitamin D deficiency, unspecified: Secondary | ICD-10-CM | POA: Diagnosis not present

## 2016-04-23 DIAGNOSIS — I10 Essential (primary) hypertension: Secondary | ICD-10-CM | POA: Diagnosis not present

## 2016-04-23 DIAGNOSIS — M5442 Lumbago with sciatica, left side: Secondary | ICD-10-CM | POA: Diagnosis not present

## 2016-04-23 DIAGNOSIS — M5441 Lumbago with sciatica, right side: Secondary | ICD-10-CM

## 2016-04-23 DIAGNOSIS — G8929 Other chronic pain: Secondary | ICD-10-CM

## 2016-04-23 LAB — T3, FREE: T3 FREE: 2.5 pg/mL (ref 2.3–4.2)

## 2016-04-23 LAB — COMPREHENSIVE METABOLIC PANEL
ALBUMIN: 4.4 g/dL (ref 3.6–5.1)
ALT: 19 U/L (ref 6–29)
AST: 19 U/L (ref 10–35)
Alkaline Phosphatase: 74 U/L (ref 33–130)
BILIRUBIN TOTAL: 0.4 mg/dL (ref 0.2–1.2)
BUN: 16 mg/dL (ref 7–25)
CALCIUM: 9.4 mg/dL (ref 8.6–10.4)
CHLORIDE: 95 mmol/L — AB (ref 98–110)
CO2: 26 mmol/L (ref 20–31)
Creat: 0.97 mg/dL — ABNORMAL HIGH (ref 0.60–0.88)
Glucose, Bld: 94 mg/dL (ref 65–99)
Potassium: 4.4 mmol/L (ref 3.5–5.3)
Sodium: 131 mmol/L — ABNORMAL LOW (ref 135–146)
Total Protein: 7.7 g/dL (ref 6.1–8.1)

## 2016-04-23 LAB — HEMOGLOBIN A1C
Hgb A1c MFr Bld: 5.5 % (ref <5.7)
Mean Plasma Glucose: 111 mg/dL

## 2016-04-23 LAB — T4, FREE: Free T4: 1.4 ng/dL (ref 0.8–1.8)

## 2016-04-23 LAB — LIPID PANEL
Cholesterol: 197 mg/dL (ref <200)
HDL: 46 mg/dL — AB (ref >50)
LDL CALC: 123 mg/dL — AB (ref <100)
TRIGLYCERIDES: 138 mg/dL (ref <150)
Total CHOL/HDL Ratio: 4.3 Ratio (ref <5.0)
VLDL: 28 mg/dL (ref <30)

## 2016-04-23 LAB — TSH: TSH: 4.25 m[IU]/L

## 2016-04-23 NOTE — Assessment & Plan Note (Addendum)
ECHO 2017: mild changes in aortic valve, calcification   never saw cardiologist. Asymptomatic at this time. Not interested in cardlogisti visit.

## 2016-04-23 NOTE — Patient Instructions (Addendum)
Take a look at medications.. Call to review over the phone with Butch Penny.  Are you taking valsatan or losartan.  Call if interested in setting up bone density.  We will call you for a referral to audiologist.  Stop at lab on way out.

## 2016-04-23 NOTE — Assessment & Plan Note (Signed)
Remains weak after surgery, but stregnth significantly improved in last year.

## 2016-04-23 NOTE — Progress Notes (Signed)
Subjective:    Patient ID: Diana [name], female    DOB: [birth year], 83 y.o.   MRN: [id number]  HPI  83 year old female presents for AMW  I have personally reviewed the Medicare Annual Wellness questionnaire and have noted 1. The patient's medical and social history 2. Their use of alcohol, tobacco or illicit drugs 3. Their current medications and supplements 4. The patient's functional ability including ADL's, fall risks, home safety risks and hearing or visual             impairment. 5. Diet and physical activities 6. Evidence for depression or mood disorders 7.         Updated provider list Cognitive evaluation was performed and recorded on pt medicare questionnaire form. The patients weight, height, BMI and visual acuity have been recorded in the chart  I have made referrals, counseling and provided education to the patient based review of the above and I have provided the pt with a written personalized care plan for preventive services.   12/2015 OV at [facility] in clinic for 2 weeks of low back pain radiating to both legs. Lumbar X-ray showed DJD, scoliosis and osteoporosis. Treated with  diclofenac 75 mg BID for paoin.  Followed up with ORTHO, Dr. Cyndy Freeze  01/21/2016 MRI lumbar spine:IMPRESSION: Extensive lumbar spondylosis with prominent facet arthropathy and Baastrup's disease. Multilevel canal stenosis moderate at L2-3 and L4-5, severe at L3-4. Addition, there is foraminal stone greatest at the right L3 through S1 levels where it is severe.  Hypertension:   Poor control today in office  on metoprolol xl 50 mg daily,amlodipine 10  Mg,  Cozaar 100, valsartan 160 mg daily   She is upset about waiting in office. BP with nurse visit well control. BP Readings from Last 3 Encounters:  04/23/16 (!) 171/77  11/07/15 106/70  08/22/15 120/76   Using medication without problems or lightheadedness:  None Chest pain with exertion: None Edema:None Short of breath:   None Average home BPs: 132/62 Other issues:   COPD: stable, no daily cough, no wheeze  Hypothyroid : well controlled last check. Lab Results  Component Value Date   TSH 2.60 06/03/2015   Prediabetes:  Lab Results  Component Value Date   HGBA1C 6.2 06/03/2015   Left ear decreased hearing.. Tinnitus. No heart beat sound.  Interested in referral to ENT.  Social History /Family History/Past Medical History reviewed and updated if needed. Patient Care Team: Jinny Sanders, MD as PCP - General Dr. Cyndy Freeze Ortho  Review of Systems  Constitutional: Negative for fatigue and fever.  HENT: Negative for congestion.   Eyes: Negative for pain.  Respiratory: Negative for cough and shortness of breath.   Cardiovascular: Negative for chest pain, palpitations and leg swelling.  Gastrointestinal: Negative for abdominal pain.  Genitourinary: Negative for dysuria and vaginal bleeding.  Musculoskeletal: Positive for back pain.  Neurological: Negative for syncope, light-headedness and headaches.  Psychiatric/Behavioral: Negative for dysphoric mood.       Objective:   Physical Exam  Constitutional: Vital signs are normal. She appears well-developed and well-nourished. She is cooperative.  Non-toxic appearance. She does not appear ill. No distress.  HENT:  Head: Normocephalic.  Right Ear: Hearing, tympanic membrane, external ear and ear canal normal.  Left Ear: Hearing, tympanic membrane, external ear and ear canal normal.  Nose: Nose normal.  Eyes: Conjunctivae, EOM and lids are normal. Pupils are equal, round, and reactive to light. Lids are everted and swept, no foreign  bodies found.  Neck: Trachea normal and normal range of motion. Neck supple. Carotid bruit is not present. No thyroid mass and no thyromegaly present.  Cardiovascular: Normal rate, regular rhythm, S1 normal, S2 normal and intact distal pulses.  Exam reveals no gallop.   Murmur heard.  Systolic murmur is present with a grade  of 3/6  Pulmonary/Chest: Effort normal and breath sounds normal. No respiratory distress. She has no wheezes. She has no rhonchi. She has no rales.  Abdominal: Soft. Normal appearance and bowel sounds are normal. She exhibits no distension, no fluid wave, no abdominal bruit and no mass. There is no hepatosplenomegaly. There is no tenderness. There is no rebound, no guarding and no CVA tenderness. No hernia.  Lymphadenopathy:    She has no cervical adenopathy.    She has no axillary adenopathy.  Neurological: She is alert. She displays atrophy. No cranial nerve deficit or sensory deficit. She exhibits abnormal muscle tone.   4/5 strength in right hip flexor and right knee extention and flexion  All other lower ext 5/5  Skin: Skin is warm, dry and intact. No rash noted.  Psychiatric: Her speech is normal and behavior is normal. Judgment normal. Her mood appears not anxious. Cognition and memory are normal. She does not exhibit a depressed mood.          Assessment & Plan:  The patient's preventative maintenance and recommended screening tests for an annual wellness exam were reviewed in full today. Brought up to date unless services declined.  Counselled on the importance of diet, exercise, and its role in overall health and mortality. The patient's FH and SH was reviewed, including their home life, tobacco status, and drug and alcohol status.   Vaccines: Uptodate with flu, shingles and pneumonia vaccines. Refuses Td.  Mammo: Nml 01/2011.Marland Kitchen Hx of breast cancer 1986.Marland Kitchen  She is not interested in further mammograms and breast exams given she would not do aggressive treatment. Colon: last 2003, no further indicated, no family history  DVE/pap: not indicated mk DXA: sister nml, no family history... Has never had. She refuses this at this time.   Decreased hearing, tinnitus.

## 2016-04-23 NOTE — Assessment & Plan Note (Addendum)
Due for re-eval. 

## 2016-04-23 NOTE — Assessment & Plan Note (Signed)
Elevated today but pt upset about wait. Yesterday BP controlled with RN visit from [facility].

## 2016-04-23 NOTE — Progress Notes (Signed)
Pre visit review using our clinic review tool, if applicable. No additional management support is needed unless otherwise documented below in the visit note. 

## 2016-04-23 NOTE — Assessment & Plan Note (Signed)
Stable mild issue on no controller.

## 2016-04-23 NOTE — Assessment & Plan Note (Addendum)
well controlled last check. Due for re-eval.

## 2016-04-24 LAB — VITAMIN D 25 HYDROXY (VIT D DEFICIENCY, FRACTURES): Vit D, 25-Hydroxy: 62 ng/mL (ref 30–100)

## 2016-04-26 ENCOUNTER — Telehealth: Payer: Self-pay | Admitting: *Deleted

## 2016-04-26 NOTE — Telephone Encounter (Signed)
Patient called stating that she was in to see Dr. Diona Browner Friday and there was some confusion regarding her medication list. Patient called to update medication list. Medication list reviewed and patient stated that these are the changes Losartan no longer takes Diclofenac 75 mg ,takes  one every 12 hours Has finished Macrobid Other medication list is correct per patient

## 2016-04-26 NOTE — Telephone Encounter (Signed)
Medication list updated.

## 2016-04-27 ENCOUNTER — Encounter: Payer: Self-pay | Admitting: *Deleted

## 2016-04-30 ENCOUNTER — Other Ambulatory Visit: Payer: Self-pay | Admitting: Family Medicine

## 2016-04-30 NOTE — Telephone Encounter (Signed)
Please review updated medication list and sign/close encounter when reviewed.

## 2016-04-30 NOTE — Telephone Encounter (Signed)
Reviewed. Okay to close.

## 2016-05-08 ENCOUNTER — Emergency Department: Payer: Medicare Other

## 2016-05-08 ENCOUNTER — Emergency Department
Admission: EM | Admit: 2016-05-08 | Discharge: 2016-05-08 | Disposition: A | Discharge status: Home / Self Care | Payer: Medicare Other | Attending: Emergency Medicine | Admitting: Emergency Medicine

## 2016-05-08 DIAGNOSIS — Z853 Personal history of malignant neoplasm of breast: Secondary | ICD-10-CM | POA: Insufficient documentation

## 2016-05-08 DIAGNOSIS — E039 Hypothyroidism, unspecified: Secondary | ICD-10-CM | POA: Insufficient documentation

## 2016-05-08 DIAGNOSIS — R21 Rash and other nonspecific skin eruption: Secondary | ICD-10-CM | POA: Diagnosis not present

## 2016-05-08 DIAGNOSIS — Z87891 Personal history of nicotine dependence: Secondary | ICD-10-CM | POA: Insufficient documentation

## 2016-05-08 DIAGNOSIS — M79661 Pain in right lower leg: Secondary | ICD-10-CM | POA: Diagnosis not present

## 2016-05-08 DIAGNOSIS — M79669 Pain in unspecified lower leg: Secondary | ICD-10-CM

## 2016-05-08 DIAGNOSIS — I10 Essential (primary) hypertension: Secondary | ICD-10-CM | POA: Diagnosis not present

## 2016-05-08 DIAGNOSIS — Z79899 Other long term (current) drug therapy: Secondary | ICD-10-CM | POA: Diagnosis not present

## 2016-05-08 DIAGNOSIS — J449 Chronic obstructive pulmonary disease, unspecified: Secondary | ICD-10-CM | POA: Insufficient documentation

## 2016-05-08 DIAGNOSIS — M79604 Pain in right leg: Secondary | ICD-10-CM | POA: Diagnosis not present

## 2016-05-08 DIAGNOSIS — M25561 Pain in right knee: Secondary | ICD-10-CM | POA: Diagnosis not present

## 2016-05-08 MED ORDER — CEPHALEXIN 500 MG PO CAPS: 500.0000 mg | ORAL_CAPSULE | Freq: Two times a day (BID) | ORAL | 0 refills | Status: DC

## 2016-05-08 MED ORDER — CLINDAMYCIN HCL 300 MG PO CAPS: 300.0000 mg | ORAL_CAPSULE | Freq: Three times a day (TID) | ORAL | 0 refills | Status: AC

## 2016-05-08 NOTE — ED Triage Notes (Signed)
Pt arrives to ER via POV c/o right lower leg and knee pain with lower right leg rash to shin. No other areas of redness besides to shin, no break in skin integrity. No recent injury but hx of arthritis to right knee.

## 2016-05-08 NOTE — ED Provider Notes (Signed)
[facility] Emergency Department Provider Note  ____________________________________________  Time seen: Approximately 4:22 PM  I have reviewed the triage vital signs and the nursing notes.   HISTORY  Chief Complaint Leg Pain and Rash    HPI Diana [name] is a 83 y.o. female resents emergency department with right knee pain and rash over front to shin with pain. Patient states that she has arthritis in every joint and has had knee pain for years. Patient states that rash and lower leg pain began this morning. Patient has an appointment with ortho on Thursday. Patient denies fever, swelling, cough, shortness of breath, nausea, vomiting.No recent surgeries. Patient does not smoke. Patient is not on any blood thinners.   Past Medical History:  Diagnosis Date  . Arthritis   . Bacterial pneumonia, unspecified   . Cancer , breast   . Candidiasis of mouth   . Chronic airway obstruction, not elsewhere classified    son not aware of this  . Encephalitis 1961  . Family history of adverse reaction to anesthesia    son has nausea  . Heart murmur    has had it for years; very mild AS 05/2015 echo  . Hyposmolality and/or hyponatremia   . Lumbago   . Obstructive chronic bronchitis with exacerbation ([place])   . Other abnormal glucose   . Pain in joint, shoulder region   . Palpitations   . Persistent disorder of initiating or maintaining sleep   . Restless leg syndrome   . Unspecified essential hypertension   . Unspecified hypothyroidism   . Unspecified viral infection, in conditions classified elsewhere and of unspecified site   . Unspecified vitamin D deficiency   . Urinary tract infection, site not specified     Patient Active Problem List   Diagnosis Date Noted  . Right-sided low back pain without sciatica 08/22/2015  . Spinal stenosis in cervical region 08/07/2015  . UTI (urinary tract infection) 08/07/2015  . Abnormality of gait 07/15/2015  . Cervical  spondylosis with myelopathy 06/26/2015  . Aortic valve calcification 06/03/2015  . BPV (benign positional vertigo) 01/29/2015  . Poor balance 10/07/2011  . Palpitations 04/28/2011  . Breast cancer, right breast ([place]) 03/29/2011  . Vitamin D deficiency 10/17/2009  . Prediabetes 10/17/2009  . COPD (chronic obstructive pulmonary disease) ([place]) 12/19/2007  . INSOMNIA, CHRONIC 10/25/2007  . BACK PAIN, LUMBAR 03/01/2007  . Hypothyroidism 01/18/2007  . HYPONATREMIA, MILD 01/18/2007  . Essential hypertension 01/18/2007    Past Surgical History:  Procedure Laterality Date  . ABI  04/19/05   Negative  . Back MRI  2005   mild bulging disks  . EYE SURGERY Bilateral    Cataract surgery   . HEMORRHOID SURGERY    . MASTECTOMY, RADICAL    . POSTERIOR CERVICAL LAMINECTOMY N/A 06/26/2015   Procedure: Cervical three-four POSTERIOR CERVICAL LAMINECTOMY for decompression ;  Surgeon: Kevan Ny Ditty, MD;  Location: [facility];  Service: Neurosurgery;  Laterality: N/A;  C34 laminectomy  . Venous doppler  04/19/05   LE normal    Prior to Admission medications   Medication Sig Start Date End Date Taking? Authorizing Provider  amLODipine (NORVASC) 10 MG tablet TAKE 1 TABLET BY MOUTH DAILY 04/30/16   Amy Cletis Athens, MD  clindamycin (CLEOCIN) 300 MG capsule Take 1 capsule (300 mg total) by mouth 3 (three) times daily. 05/08/16 05/18/16  Laban Emperor, PA-C  diclofenac (VOLTAREN) 75 MG EC tablet Take 75 mg by mouth 2 (two) times daily.  04/06/16   Historical Provider, MD  docusate sodium (COLACE) 100 MG capsule Take 1 capsule (100 mg total) by mouth 2 (two) times daily. 06/27/15   Kevan Ny Ditty, MD  ergocalciferol (VITAMIN D2) 50000 units capsule Take 50,000 Units by mouth once a week.    Historical Provider, MD  furosemide (LASIX) 40 MG tablet TAKE 1 TABLET BY MOUTH DAILY 02/24/16   Amy E Diona Browner, MD  levothyroxine (SYNTHROID, LEVOTHROID) 100 MCG tablet TAKE 1 TABLET BY MOUTH DAILY 03/07/16   Amy Cletis Athens, MD  metoprolol succinate (TOPROL-XL) 50 MG 24 hr tablet TAKE 1 TABLET BY MOUTH DAILY 02/06/16   Amy Cletis Athens, MD  traZODone (DESYREL) 50 MG tablet Take 0.5-1 tablets (25-50 mg total) by mouth at bedtime as needed for sleep. 01/09/16   Amy E Diona Browner, MD  valsartan (DIOVAN) 160 MG tablet TAKE 1 TABLET BY MOUTH DAILY 03/07/16   Amy E Diona Browner, MD  Vitamin D, Ergocalciferol, (DRISDOL) 50000 units CAPS capsule TAKE 1 CAPSULE BY MOUTH ON THE SAME DAY EVERY WEEK AS DIRECTED. 12/04/15   Jinny Sanders, MD    Allergies Meperidine hcl  Family History  Problem Relation Age of Onset  . Rheum arthritis Father   . Stroke Mother   . Diabetes Mother   . Healthy Sister   . Healthy Sister   . Liver cancer      Aunt  . Stomach cancer      Aunt    Social History Social History  Substance Use Topics  . Smoking status: Former Smoker    Packs/day: 0.50    Years: 42.00    Types: Cigarettes  . Smokeless tobacco: Never Used  . Alcohol use No     Review of Systems  Constitutional: No fever/chills ENT: No upper respiratory complaints. Cardiovascular: No chest pain. Respiratory: No cough. No SOB. Gastrointestinal: No abdominal pain.  No nausea, no vomiting.  Genitourinary: Negative for dysuria. Skin: Negative for abrasions, lacerations, ecchymosis. Neurological: Negative for headaches, numbness or tingling   ____________________________________________   PHYSICAL EXAM:  VITAL SIGNS: ED Triage Vitals  Enc Vitals Group     BP 05/08/16 1211 107/68     Pulse Rate 05/08/16 1211 64     Resp 05/08/16 1211 18     Temp 05/08/16 1211 98 F (36.7 C)     Temp Source 05/08/16 1211 Oral     SpO2 05/08/16 1211 98 %     Weight 05/08/16 1211 175 lb (79.4 kg)     Height 05/08/16 1211 5\' 3"  (1.6 m)     Head Circumference --      Peak Flow --      Pain Score 05/08/16 1555 0     Pain Loc --      Pain Edu? --      Excl. in [place]? --      Constitutional: Alert and oriented. Well appearing and  in no acute distress. Eyes: Conjunctivae are normal. PERRL. EOMI. Head: Atraumatic. ENT:      Ears:      Nose: No congestion/rhinnorhea.      Mouth/Throat: Mucous membranes are moist.  Neck: No stridor.   Cardiovascular: Normal rate, regular rhythm.  Good peripheral circulation.2+ dorsalis pedis and posterior tibialis pulses. Respiratory: Normal respiratory effort without tachypnea or retractions. Lungs CTAB. Good air entry to the bases with no decreased or absent breath sounds. Musculoskeletal: Full range of motion to all extremities. No gross deformities appreciated. Tenderness to palpation over short right  shin and calf. No tenderness to palpation over knee. No swelling noted. Neurologic:  Normal speech and language. No gross focal neurologic deficits are appreciated. Incision of toes intact. Skin:  Skin is warm, dry and intact. Petechial 4 by 1 inch rash over right shin with warmth and erythema. Psychiatric: Mood and affect are normal. Speech and behavior are normal. Patient exhibits appropriate insight and judgement.   ____________________________________________   LABS (all labs ordered are listed, but only abnormal results are displayed)  Labs Reviewed - No data to display ____________________________________________  EKG   ____________________________________________  RADIOLOGY  US Venous Img Lower Unilateral Right  Result Date: 05/08/2016 CLINICAL DATA:  Right-sided calf pain and erythema. EXAM: RIGHT LOWER EXTREMITY VENOUS DOPPLER ULTRASOUND TECHNIQUE: Gray-scale sonography with graded compression, as well as color Doppler and duplex ultrasound were performed to evaluate the lower extremity deep venous systems from the level of the common femoral vein and including the common femoral, femoral, profunda femoral, popliteal and calf veins including the posterior tibial, peroneal and gastrocnemius veins when visible. The superficial great saphenous vein was also interrogated.  Spectral Doppler was utilized to evaluate flow at rest and with distal augmentation maneuvers in the common femoral, femoral and popliteal veins. COMPARISON:  None. FINDINGS: Contralateral Common Femoral Vein: Respiratory phasicity is normal and symmetric with the symptomatic side. No evidence of thrombus. Normal compressibility. Common Femoral Vein: No evidence of thrombus. Normal compressibility, respiratory phasicity and response to augmentation. Saphenofemoral Junction: No evidence of thrombus. Normal compressibility and flow on color Doppler imaging. Profunda Femoral Vein: No evidence of thrombus. Normal compressibility and flow on color Doppler imaging. Femoral Vein: No evidence of thrombus. Normal compressibility, respiratory phasicity and response to augmentation. Popliteal Vein: No evidence of thrombus. Normal compressibility, respiratory phasicity and response to augmentation. Calf Veins: No evidence of thrombus. Normal compressibility and flow on color Doppler imaging. Superficial Great Saphenous Vein: No evidence of thrombus. Normal compressibility and flow on color Doppler imaging. Venous Reflux:  None. Other Findings: No evidence of superficial thrombophlebitis or abnormal fluid collection. IMPRESSION: No evidence of right lower extremity deep venous thrombosis. Electronically Signed   By: Aletta Edouard M.D.   On: 05/08/2016 14:19    ____________________________________________    PROCEDURES  Procedure(s) performed:    Procedures    Medications - No data to display   ____________________________________________   INITIAL IMPRESSION / ASSESSMENT AND PLAN / ED COURSE  Pertinent labs & imaging results that were available during my care of the patient were reviewed by me and considered in my medical decision making (see chart for details).  Review of the [place] CSRS was performed in accordance of the [place] prior to dispensing any controlled drugs.   Patient is a 83 year old female  that  presented to the emergency department with one day of rash and lower right leg pain. Exam and vital signs are reassuring. No DVT seen on ultrasound. Patient will be discharged home with prescriptions for Clindamycin. Patient has an appointment with ortho on Thursday. Patient is to follow up with PCP as directed. Patient is given ED precautions to return to the ED for any worsening or new symptoms.     ____________________________________________  FINAL CLINICAL IMPRESSION(S) / ED DIAGNOSES  Final diagnoses:  Calf pain  Rash and nonspecific skin eruption      NEW MEDICATIONS STARTED DURING THIS VISIT:  Discharge Medication List as of 05/08/2016  3:20 PM    START taking these medications   Details  clindamycin (  CLEOCIN) 300 MG capsule Take 1 capsule (300 mg total) by mouth 3 (three) times daily., Starting Sat 05/08/2016, Until Tue 05/18/2016, Print            This chart was dictated using voice recognition software/Dragon. Despite best efforts to proofread, errors can occur which can change the meaning. Any change was purely unintentional.    Laban Emperor, PA-C 05/09/16 [place], MD 05/11/16 [phone]

## 2016-05-13 DIAGNOSIS — I10 Essential (primary) hypertension: Secondary | ICD-10-CM | POA: Diagnosis not present

## 2016-05-13 DIAGNOSIS — M25561 Pain in right knee: Secondary | ICD-10-CM | POA: Diagnosis not present

## 2016-05-13 DIAGNOSIS — M48062 Spinal stenosis, lumbar region with neurogenic claudication: Secondary | ICD-10-CM | POA: Diagnosis not present

## 2016-05-20 DIAGNOSIS — M1711 Unilateral primary osteoarthritis, right knee: Secondary | ICD-10-CM | POA: Diagnosis not present

## 2016-05-25 ENCOUNTER — Other Ambulatory Visit: Payer: Self-pay | Admitting: Family Medicine

## 2016-05-25 NOTE — Telephone Encounter (Signed)
Last office visit 04/23/2016.  Last Vit D normal on 04/23/2016.  Last refilled 12/04/2015 for #12 with no refills.  Refill?

## 2016-06-08 ENCOUNTER — Other Ambulatory Visit: Payer: Self-pay | Admitting: Family Medicine

## 2016-06-23 ENCOUNTER — Telehealth: Payer: Self-pay

## 2016-06-23 NOTE — Telephone Encounter (Signed)
Pt left v/m; pt is scheduled for dental work next week and pt request at least 1 valium to Diana [name]. Pt is having bottom teeth pulled and has appt next week to have 3 teeth pulled and get a plate. Pt request cb when med sent to pharmacy.annual exam on 04/23/16.

## 2016-06-24 MED ORDER — ALPRAZOLAM 0.5 MG PO TBDP: ORAL_TABLET | ORAL | 0 refills | Status: DC

## 2016-06-24 MED ORDER — ALPRAZOLAM 0.5 MG PO TABS: ORAL_TABLET | ORAL | 0 refills | Status: DC

## 2016-06-24 NOTE — Telephone Encounter (Signed)
Chose to send in alprazolam as not as long acting as valium. Should last 4 hours, but not 8 like valium.

## 2016-06-24 NOTE — Telephone Encounter (Signed)
Alprazolam called into [facility].  Ms. Murtaugh notified as instructed by telephone.

## 2016-07-09 ENCOUNTER — Telehealth: Payer: Self-pay | Admitting: Family Medicine

## 2016-07-09 NOTE — Telephone Encounter (Signed)
Bruce called  About fmla   fmla will be intermittant   To take back and forth to dr appointment.

## 2016-07-20 NOTE — Telephone Encounter (Signed)
fmla paperwork in Dr Diona Browner IN BOX For review and signature

## 2016-07-20 NOTE — Telephone Encounter (Signed)
Completed.

## 2016-07-21 NOTE — Telephone Encounter (Signed)
Bruce aware paperwork ready Mailed per bruce request to [employer]   Copy for file Copy for scan Copy for pt

## 2016-07-26 ENCOUNTER — Other Ambulatory Visit: Payer: Self-pay | Admitting: Family Medicine

## 2016-08-05 DIAGNOSIS — M5416 Radiculopathy, lumbar region: Secondary | ICD-10-CM | POA: Diagnosis not present

## 2016-08-05 DIAGNOSIS — M48062 Spinal stenosis, lumbar region with neurogenic claudication: Secondary | ICD-10-CM | POA: Diagnosis not present

## 2016-08-11 DIAGNOSIS — M48062 Spinal stenosis, lumbar region with neurogenic claudication: Secondary | ICD-10-CM | POA: Diagnosis not present

## 2016-08-12 ENCOUNTER — Other Ambulatory Visit: Payer: Self-pay | Admitting: Family Medicine

## 2016-08-12 NOTE — Telephone Encounter (Signed)
Last office visit 01.05.2018.  Last Vitamin D level 04/23/2016 which was normal at 62 ng/ml.  Refill?

## 2016-09-16 ENCOUNTER — Other Ambulatory Visit: Payer: Self-pay | Admitting: Family Medicine

## 2016-10-26 ENCOUNTER — Ambulatory Visit (INDEPENDENT_AMBULATORY_CARE_PROVIDER_SITE_OTHER): Payer: Medicare Other | Admitting: Family Medicine

## 2016-10-26 ENCOUNTER — Encounter: Payer: Self-pay | Admitting: Family Medicine

## 2016-10-26 VITALS — BP 140/64 | HR 64 | Temp 98.3°F | Ht 63.0 in | Wt 174.2 lb

## 2016-10-26 DIAGNOSIS — N898 Other specified noninflammatory disorders of vagina: Secondary | ICD-10-CM | POA: Diagnosis not present

## 2016-10-26 LAB — POC URINALSYSI DIPSTICK (AUTOMATED)
BILIRUBIN UA: NEGATIVE
Glucose, UA: NEGATIVE
Ketones, UA: NEGATIVE
Leukocytes, UA: NEGATIVE
NITRITE UA: NEGATIVE
PH UA: 6 (ref 5.0–8.0)
RBC UA: NEGATIVE
UROBILINOGEN UA: 0.2 U/dL

## 2016-10-26 NOTE — Patient Instructions (Signed)
Stop OTC cream.  Start a trial of cortisone 10 cream daily  X 1-2 weeks.  Call if not improving.

## 2016-10-26 NOTE — Assessment & Plan Note (Signed)
UA clear.  Most likely estrogen deficiency or contact irritation causing symptoms.  No other symptoms of yeast infection.  trial of  Low dosetopical steroid.. If pt not improving .Marland Kitchen Check wet prep, examine.

## 2016-10-26 NOTE — Progress Notes (Signed)
   Subjective:    Patient ID: Diana [name], female    DOB: [birth year], 83 y.o.   MRN: [id number]  HPI   83 year old female present with new onset soreness/irritation of labia started in last  3-4 months, off and on.   She feels it has been ongoing since started diclofenac 06/2016. Feels it may have  Improved when she had to stop the diclofenac.  No dysuria. No vaginal discharge.  no incontinence.  She has slight redness at vaginal labia.  She has noted some improvement with topical antifungal cream.. Using it off and on.  No new exposures.. No new detergent or soap. Uses hypoallergenic soap.   UA in office today is clear.   Review of Systems  Constitutional: Negative for fatigue and fever.  HENT: Negative for ear pain.   Eyes: Negative for pain.  Respiratory: Negative for chest tightness and shortness of breath.   Cardiovascular: Negative for chest pain, palpitations and leg swelling.  Gastrointestinal: Negative for abdominal pain.  Genitourinary: Negative for dysuria.       Objective:   Physical Exam  Constitutional: She appears well-developed.  HENT:  Head: Normocephalic.  Cardiovascular: Normal rate and regular rhythm.   Pulmonary/Chest: Effort normal and breath sounds normal.  Abdominal: Soft. Bowel sounds are normal. There is no tenderness.  Genitourinary:  Genitourinary Comments: Pt refused GYN exam.          Assessment & Plan:

## 2016-10-28 ENCOUNTER — Other Ambulatory Visit: Payer: Self-pay | Admitting: Family Medicine

## 2016-11-02 ENCOUNTER — Telehealth: Payer: Self-pay

## 2016-11-02 NOTE — Telephone Encounter (Signed)
Pt left v/m; pt received call from [facility] that valsartan had been recalled and request pt to contact PCP for substitute med to [place]. Pt request cb when done.Please advise.

## 2016-11-03 NOTE — Telephone Encounter (Signed)
Spoke with Diana [name] at [facility].  She recommends changing her to an ARB like Losartan.

## 2016-11-03 NOTE — Telephone Encounter (Signed)
[facility] call [facility].. What  Direct substitutions are availble? Or fdo I need to send in a different ARB such as losartan?

## 2016-11-04 ENCOUNTER — Other Ambulatory Visit: Payer: Self-pay | Admitting: Family Medicine

## 2016-11-04 MED ORDER — LOSARTAN POTASSIUM 100 MG PO TABS: 100.0000 mg | ORAL_TABLET | Freq: Every day | ORAL | 3 refills | Status: DC

## 2016-11-04 NOTE — Progress Notes (Signed)
Diana [name] notified as instructed by telephone.

## 2016-11-04 NOTE — Progress Notes (Signed)
Start losartan 100 mg daily instead of valsartan 160... Follow BP. Call if < 90/60 or > 140/90.

## 2016-11-17 ENCOUNTER — Ambulatory Visit (INDEPENDENT_AMBULATORY_CARE_PROVIDER_SITE_OTHER): Payer: Medicare Other | Admitting: Family Medicine

## 2016-11-17 ENCOUNTER — Encounter: Payer: Self-pay | Admitting: Family Medicine

## 2016-11-17 VITALS — BP 156/84 | HR 63 | Temp 97.9°F | Ht 63.0 in | Wt 173.0 lb

## 2016-11-17 DIAGNOSIS — I890 Lymphedema, not elsewhere classified: Secondary | ICD-10-CM | POA: Diagnosis not present

## 2016-11-17 NOTE — Progress Notes (Addendum)
BP (!) 156/84 (BP Location: Left Arm, Patient Position: Sitting, Cuff Size: Large)   Pulse 63   Temp 97.9 F (36.6 C) (Oral)   Ht 5\' 3"  (1.6 m)   Wt 173 lb (78.5 kg)   SpO2 97%   BMI 30.65 kg/m    CC: R arm and leg swelling Subjective:    Patient ID: Diana [name], female    DOB: 03-26-1924, 83 y.o.   MRN: [id number]  HPI: Diana [name] is a 83 y.o. female presenting on 11/17/2016 for Edema (R arm x5 days and R leg x2 days, intermittently, thinks swelling started after valsartan switched to losartan)   Here with son today.   2d h/o R arm and several day h/o R leg swelling. No redness, warmth or pain.   Recent switch to losartan with valsartan recall. This was changed 2d ago. They really think this caused the swelling.   Brings log of bps last few days - 139-158/62-65  H/o R mastectomy years ago. Arm would swell up frequently, but not recently.   Relevant past medical, surgical, family and social history reviewed and updated as indicated. Interim medical history since our last visit reviewed. Allergies and medications reviewed and updated. Outpatient Medications Prior to Visit  Medication Sig Dispense Refill  . ALPRAZolam (XANAX) 0.5 MG tablet Take one tablet 15-30 minutes prior to procedure.  Do Not Take and Drive. May repeat x 1 if not adequate. 2 tablet 0  . amLODipine (NORVASC) 10 MG tablet TAKE 1 TABLET BY MOUTH DAILY 90 tablet 1  . diclofenac (VOLTAREN) 75 MG EC tablet Take 75 mg by mouth 2 (two) times daily.     Marland Kitchen docusate sodium (COLACE) 100 MG capsule Take 1 capsule (100 mg total) by mouth 2 (two) times daily. 10 capsule 0  . ergocalciferol (VITAMIN D2) 50000 units capsule Take 50,000 Units by mouth once a week.    . furosemide (LASIX) 40 MG tablet TAKE 1 TABLET BY MOUTH DAILY 90 tablet 1  . levothyroxine (SYNTHROID, LEVOTHROID) 100 MCG tablet TAKE 1 TABLET BY MOUTH DAILY 90 tablet 3  . losartan (COZAAR) 100 MG tablet Take 1 tablet (100 mg total) by mouth daily.  30 tablet 3  . metoprolol succinate (TOPROL-XL) 50 MG 24 hr tablet TAKE 1 TABLET BY MOUTH DAILY 90 tablet 1  . traZODone (DESYREL) 50 MG tablet TAKE ONE-HALF TO ONE TABLET BY MOUTH AT BEDTIME AS NEEDED FOR SLEEP 30 tablet 5  . Vitamin D, Ergocalciferol, (DRISDOL) 50000 units CAPS capsule TAKE 1 CAPSULE BY MOUTH ON THE SAME DAY EVERY WEEK AS DIRECTED 12 capsule 1   No facility-administered medications prior to visit.      Per HPI unless specifically indicated in ROS section below Review of Systems     Objective:    BP (!) 156/84 (BP Location: Left Arm, Patient Position: Sitting, Cuff Size: Large)   Pulse 63   Temp 97.9 F (36.6 C) (Oral)   Ht 5\' 3"  (1.6 m)   Wt 173 lb (78.5 kg)   SpO2 97%   BMI 30.65 kg/m   Wt Readings from Last 3 Encounters:  11/17/16 173 lb (78.5 kg)  10/26/16 174 lb 4 oz (79 kg)  05/08/16 175 lb (79.4 kg)    Physical Exam  Constitutional: She appears well-developed and well-nourished. No distress.  HENT:  Head: Normocephalic and atraumatic.  Mouth/Throat: Oropharynx is clear and moist. No oropharyngeal exudate.  Very hard of hearing  Cardiovascular: Normal rate, regular  rhythm and intact distal pulses.   Murmur (3/6 systolic apex) heard. Pulmonary/Chest: Effort normal and breath sounds normal. No respiratory distress. She has no wheezes. She has no rales.  Musculoskeletal: Normal range of motion. She exhibits edema.  Pitting edema R upper extremity from hand to shoulder, mild tenderness but no erythema or warmth No significant pedal edema BLE  Nursing note and vitals reviewed.  Results for orders placed or performed in visit on 10/26/16  POCT Urinalysis Dipstick (Automated)  Result Value Ref Range   Color, UA yellow    Clarity, UA clear    Glucose, UA negative    Bilirubin, UA negative    Ketones, UA negative    Spec Grav, UA >=1.030 (A) 1.010 - 1.025   Blood, UA negative    pH, UA 6.0 5.0 - 8.0   Protein, UA trace    Urobilinogen, UA 0.2 0.2  or 1.0 E.U./dL   Nitrite, UA negative    Leukocytes, UA Negative Negative   Lab Results  Component Value Date   CREATININE 0.97 (H) 04/23/2016    Lab Results  Component Value Date   TSH 4.25 04/23/2016       Assessment & Plan:   Problem List Items Addressed This Visit    Lymphedema of right arm - Primary    Anticipate flare of RLE lymphedema after mastectomy, brought on by worsening blood pressure control with switch in ARB. No signs of infection, not consistent with DVT. rec supportive care - elevation of arm above heart, continue to avoid salt, push fluids. I also suggested trial lasix 40mg  bid x 2-3 days. If no better, low threshold to return for further labs.  Pt and son agree with plan.  No significant pedal edema appreciated today.           Follow up plan: No Follow-up on file.  Ria Bush, MD

## 2016-11-17 NOTE — Patient Instructions (Addendum)
I think the arm swelling is coming from flare of lymphedema, keep arm and leg elevated as much as able.  Continue to avoid salt, drink plenty of water (increase water).  Consider taking lasix 40mg  twice daily (morning and early afternoon) for 2-3 days to get some fluid off.  Watch for spreading redness, fevers or worsening pain.

## 2016-11-17 NOTE — Assessment & Plan Note (Signed)
Anticipate flare of RLE lymphedema after mastectomy, brought on by worsening blood pressure control with switch in ARB. No signs of infection, not consistent with DVT. rec supportive care - elevation of arm above heart, continue to avoid salt, push fluids. I also suggested trial lasix 40mg  bid x 2-3 days. If no better, low threshold to return for further labs.  Pt and son agree with plan.  No significant pedal edema appreciated today.

## 2016-11-26 ENCOUNTER — Ambulatory Visit (INDEPENDENT_AMBULATORY_CARE_PROVIDER_SITE_OTHER): Payer: Medicare Other | Admitting: Family Medicine

## 2016-11-26 ENCOUNTER — Encounter: Payer: Self-pay | Admitting: Family Medicine

## 2016-11-26 DIAGNOSIS — I10 Essential (primary) hypertension: Secondary | ICD-10-CM | POA: Diagnosis not present

## 2016-11-26 DIAGNOSIS — I890 Lymphedema, not elsewhere classified: Secondary | ICD-10-CM

## 2016-11-26 MED ORDER — AMLODIPINE BESYLATE-VALSARTAN 10-160 MG PO TABS: 1.0000 | ORAL_TABLET | Freq: Every day | ORAL | 11 refills | Status: DC

## 2016-11-26 NOTE — Progress Notes (Signed)
   Subjective:    Patient ID: Diana [name], female    DOB: 01-31-1924, 83 y.o.   MRN: [id number]  HPI    83 year old female presents following right arm lymphedema following BP elevation after change to losartan.   BP Readings from Last 3 Encounters:  11/26/16 (!) 136/56  11/17/16 (!) 156/84  10/26/16 140/64   Dr. [name] recommended elevation of arm above heart, avoiding salt, start on lasix 40 mg BID x 2-3 days.  She increased urination. Blood pressure per son has been 160/70.   She had been dizzy and nausea..  Stopped  Losartan and restarted valsartan. BP, nausea and dizziness improved. Still with swelling in right arm, no pain. No redness, no heat.  No fever.  H/X of right mastectomy.  Review of Systems  Constitutional: Positive for fatigue. Negative for fever.  HENT: Negative for ear pain.   Eyes: Negative for pain.  Respiratory: Negative for chest tightness and shortness of breath.   Cardiovascular: Negative for chest pain, palpitations and leg swelling.  Gastrointestinal: Negative for abdominal pain.  Genitourinary: Negative for dysuria.       Objective:   Physical Exam  Constitutional: She appears well-developed and well-nourished. No distress.  HENT:  Head: Normocephalic and atraumatic.  Mouth/Throat: Oropharynx is clear and moist. No oropharyngeal exudate.  Very hard of hearing  Cardiovascular: Normal rate, regular rhythm and intact distal pulses.   Murmur (3/6 systolic apex) heard. Pulmonary/Chest: Effort normal and breath sounds normal. No respiratory distress. She has no wheezes. She has no rales.  Musculoskeletal: Normal range of motion. She exhibits edema.  Pitting edema R upper extremity from hand to shoulder, NO tenderness but no erythema or warmth No significant pedal edema BLE  Nursing note and vitals reviewed.         Assessment & Plan:

## 2016-11-26 NOTE — Assessment & Plan Note (Signed)
Will take time to resolve once BP stays low. Pt unable to elevate given past mastectomy and shoulder decreased ROM.  No worse, may be some better.  No sign of infection, no pain.

## 2016-11-26 NOTE — Assessment & Plan Note (Signed)
Pt feel overall much better off losartan and back on valsartan.  Son will eval if her valsartan lot was the one recalled.. If not continue.  If it was truly recalled...change to valsartan amlodipine combo.

## 2016-11-26 NOTE — Patient Instructions (Signed)
Stop losartan.  Check lot number to see if the valsartan is recalled.. If it is.. Stop and stop amlodipine.  Start combination amlodipine Jasmine Awe instead.  If not recall continue meds as you always do. Elevate arm as able.. Will take time for swelling to resolve.

## 2016-12-22 ENCOUNTER — Telehealth: Payer: Self-pay

## 2016-12-22 MED ORDER — AMLODIPINE BESYLATE-VALSARTAN 10-160 MG PO TABS: 1.0000 | ORAL_TABLET | Freq: Every day | ORAL | 11 refills | Status: DC

## 2016-12-22 NOTE — Telephone Encounter (Signed)
Bruce called and valsartan pt is taking is not on recall but Bruce cannot find refill # and request valsartan sent to [facility]; I spoke with Hoyle Sauer at [facility] and manufacturer cannot get the valsartan pt has been taking. Bruce only wants to go to [facility]; so pt will stop valsartan and amlodipine as instructed at last visit and amlodipine valsartan sent to [facility](Bruce threw away rx given at last visit). Bruce will pick up new rx of amlodipine valsartan.

## 2016-12-27 DIAGNOSIS — M25511 Pain in right shoulder: Secondary | ICD-10-CM | POA: Diagnosis not present

## 2016-12-27 DIAGNOSIS — M25562 Pain in left knee: Secondary | ICD-10-CM | POA: Diagnosis not present

## 2017-01-07 ENCOUNTER — Encounter: Payer: Self-pay | Admitting: Family Medicine

## 2017-01-07 ENCOUNTER — Ambulatory Visit (INDEPENDENT_AMBULATORY_CARE_PROVIDER_SITE_OTHER): Payer: Medicare Other | Admitting: Family Medicine

## 2017-01-07 VITALS — BP 118/70 | HR 52 | Temp 98.0°F | Wt 174.0 lb

## 2017-01-07 DIAGNOSIS — I890 Lymphedema, not elsewhere classified: Secondary | ICD-10-CM | POA: Diagnosis not present

## 2017-01-07 DIAGNOSIS — Z23 Encounter for immunization: Secondary | ICD-10-CM

## 2017-01-07 DIAGNOSIS — R252 Cramp and spasm: Secondary | ICD-10-CM

## 2017-01-07 MED ORDER — TRAMADOL HCL 50 MG PO TABS: 50.0000 mg | ORAL_TABLET | Freq: Two times a day (BID) | ORAL | 0 refills | Status: DC | PRN

## 2017-01-07 NOTE — Patient Instructions (Addendum)
Please stop at the front desk to set up referral. Please stop at the lab to have labs drawn. Can try tramadol for pain..  Try low dose 1/2 tab for significant pain.. Try to limit.

## 2017-01-07 NOTE — Progress Notes (Signed)
Subjective:    Patient ID: Diana [name], female    DOB: 1924-03-04, 83 y.o.   MRN: [id number]  HPI    83 year old female  With history of chronic lumbar pain, poor balance cervical spinal stenosis s/p surgical intervention in 2017 presents with  contnue right arm swelling and bilateral leg cramps.  Cramping in legs for months.. Some better with walking. Occur most at night.    She had steroid injection with Dr. Lorenz Coaster.. In right shoulder ,  Right knee pain.Marland Kitchen Helped a little. She continues to have swelling in right arm ... History of lymphedema from past , mastectomy.  Cannot elevate it given shoulder issues.   Using  diclofenac for back pain.. But now feels bad, nausea. Not helping with back pain.  Review of Systems  Constitutional: Negative for fatigue and fever.  HENT: Negative for ear pain.   Eyes: Negative for pain.  Respiratory: Negative for chest tightness and shortness of breath.   Cardiovascular: Negative for chest pain, palpitations and leg swelling.  Gastrointestinal: Negative for abdominal pain.  Genitourinary: Negative for dysuria.       Objective:   Physical Exam  Constitutional: She is oriented to person, place, and time. Vital signs are normal. She appears well-developed and well-nourished. She is cooperative.  Non-toxic appearance. She does not appear ill. No distress.  obese  HENT:  Head: Normocephalic and atraumatic.  Right Ear: Hearing, tympanic membrane, external ear and ear canal normal. Tympanic membrane is not erythematous, not retracted and not bulging.  Left Ear: Hearing, tympanic membrane, external ear and ear canal normal. Tympanic membrane is not erythematous, not retracted and not bulging.  Nose: No mucosal edema or rhinorrhea. Right sinus exhibits no maxillary sinus tenderness and no frontal sinus tenderness. Left sinus exhibits no maxillary sinus tenderness and no frontal sinus tenderness.  Mouth/Throat: Uvula is midline, oropharynx is clear  and moist and mucous membranes are normal. No oropharyngeal exudate.  Very hard of hearing  Eyes: Pupils are equal, round, and reactive to light. Conjunctivae, EOM and lids are normal. Lids are everted and swept, no foreign bodies found.  Neck: Trachea normal and normal range of motion. Neck supple. Carotid bruit is not present. No thyroid mass and no thyromegaly present.  Cardiovascular: Normal rate, regular rhythm, S1 normal, S2 normal, normal heart sounds, intact distal pulses and normal pulses.  Exam reveals no gallop and no friction rub.   No murmur heard. Pulmonary/Chest: Effort normal and breath sounds normal. No tachypnea. No respiratory distress. She has no decreased breath sounds. She has no wheezes. She has no rhonchi. She has no rales.  Abdominal: Soft. Normal appearance and bowel sounds are normal. There is no tenderness.  Musculoskeletal: She exhibits edema.       Lumbar back: She exhibits decreased range of motion.  Pitting edema R upper extremity from hand to shoulder, NO tenderness but no erythema or warmth No significant pedal edema BLE  Neurological: She is alert and oriented to person, place, and time. She has normal strength. She displays atrophy. A sensory deficit is present. She exhibits normal muscle tone. Gait abnormal.  Strength normal but very limited low body endurance  Skin: Skin is warm, dry and intact. No rash noted.  Psychiatric: Her speech is normal and behavior is normal. Judgment and thought content normal. Her mood appears not anxious. Cognition and memory are normal. She does not exhibit a depressed mood.  Nursing note and vitals reviewed.  Assessment & Plan:

## 2017-01-08 LAB — CBC WITH DIFFERENTIAL/PLATELET
BASOS ABS: 29 {cells}/uL (ref 0–200)
Basophils Relative: 0.3 %
EOS PCT: 2.9 %
Eosinophils Absolute: 276 cells/uL (ref 15–500)
HCT: 23.7 % — ABNORMAL LOW (ref 35.0–45.0)
Hemoglobin: 7.9 g/dL — ABNORMAL LOW (ref 11.7–15.5)
Lymphs Abs: 2223 cells/uL (ref 850–3900)
MCH: 29.9 pg (ref 27.0–33.0)
MCHC: 33.3 g/dL (ref 32.0–36.0)
MCV: 89.8 fL (ref 80.0–100.0)
MONOS PCT: 9.2 %
MPV: 9.7 fL (ref 7.5–12.5)
NEUTROS ABS: 6099 {cells}/uL (ref 1500–7800)
NEUTROS PCT: 64.2 %
PLATELETS: 390 10*3/uL (ref 140–400)
RBC: 2.64 10*6/uL — ABNORMAL LOW (ref 3.80–5.10)
RDW: 13.2 % (ref 11.0–15.0)
TOTAL LYMPHOCYTE: 23.4 %
WBC mixed population: 874 cells/uL (ref 200–950)
WBC: 9.5 10*3/uL (ref 3.8–10.8)

## 2017-01-08 LAB — COMPREHENSIVE METABOLIC PANEL
AG Ratio: 1.2 (calc) (ref 1.0–2.5)
ALBUMIN MSPROF: 3.4 g/dL — AB (ref 3.6–5.1)
ALKALINE PHOSPHATASE (APISO): 81 U/L (ref 33–130)
ALT: 10 U/L (ref 6–29)
AST: 9 U/L — ABNORMAL LOW (ref 10–35)
BILIRUBIN TOTAL: 0.2 mg/dL (ref 0.2–1.2)
BUN/Creatinine Ratio: 15 (calc) (ref 6–22)
BUN: 18 mg/dL (ref 7–25)
CALCIUM: 8.3 mg/dL — AB (ref 8.6–10.4)
CHLORIDE: 95 mmol/L — AB (ref 98–110)
CO2: 27 mmol/L (ref 20–32)
Creat: 1.2 mg/dL — ABNORMAL HIGH (ref 0.60–0.88)
GLOBULIN: 2.8 g/dL (ref 1.9–3.7)
Glucose, Bld: 104 mg/dL — ABNORMAL HIGH (ref 65–99)
POTASSIUM: 4.6 mmol/L (ref 3.5–5.3)
Sodium: 129 mmol/L — ABNORMAL LOW (ref 135–146)
Total Protein: 6.2 g/dL (ref 6.1–8.1)

## 2017-01-08 LAB — TSH: TSH: 3.42 m[IU]/L (ref 0.40–4.50)

## 2017-01-08 LAB — T3, FREE: T3, Free: 2.1 pg/mL — ABNORMAL LOW (ref 2.3–4.2)

## 2017-01-08 LAB — MAGNESIUM: MAGNESIUM: 2.1 mg/dL (ref 1.5–2.5)

## 2017-01-08 LAB — T4, FREE: FREE T4: 1.1 ng/dL (ref 0.8–1.8)

## 2017-01-10 ENCOUNTER — Emergency Department
Admission: EM | Admit: 2017-01-10 | Discharge: 2017-01-10 | Disposition: A | Discharge status: Home / Self Care | Payer: Medicare Other | Attending: Emergency Medicine | Admitting: Emergency Medicine

## 2017-01-10 ENCOUNTER — Encounter: Payer: Self-pay | Admitting: Emergency Medicine

## 2017-01-10 ENCOUNTER — Emergency Department: Payer: Medicare Other

## 2017-01-10 DIAGNOSIS — E039 Hypothyroidism, unspecified: Secondary | ICD-10-CM | POA: Diagnosis not present

## 2017-01-10 DIAGNOSIS — I890 Lymphedema, not elsewhere classified: Secondary | ICD-10-CM | POA: Diagnosis not present

## 2017-01-10 DIAGNOSIS — J449 Chronic obstructive pulmonary disease, unspecified: Secondary | ICD-10-CM | POA: Insufficient documentation

## 2017-01-10 DIAGNOSIS — M7989 Other specified soft tissue disorders: Secondary | ICD-10-CM | POA: Diagnosis not present

## 2017-01-10 DIAGNOSIS — Z853 Personal history of malignant neoplasm of breast: Secondary | ICD-10-CM | POA: Diagnosis not present

## 2017-01-10 DIAGNOSIS — R609 Edema, unspecified: Secondary | ICD-10-CM | POA: Diagnosis not present

## 2017-01-10 DIAGNOSIS — R600 Localized edema: Secondary | ICD-10-CM | POA: Diagnosis not present

## 2017-01-10 DIAGNOSIS — I10 Essential (primary) hypertension: Secondary | ICD-10-CM | POA: Diagnosis not present

## 2017-01-10 DIAGNOSIS — Z9011 Acquired absence of right breast and nipple: Secondary | ICD-10-CM | POA: Diagnosis not present

## 2017-01-10 DIAGNOSIS — R799 Abnormal finding of blood chemistry, unspecified: Secondary | ICD-10-CM | POA: Diagnosis present

## 2017-01-10 LAB — TYPE AND SCREEN
ABO/RH(D): A POS
ANTIBODY SCREEN: NEGATIVE

## 2017-01-10 LAB — CBC
HCT: 25.8 % — ABNORMAL LOW (ref 35.0–47.0)
HEMOGLOBIN: 8.7 g/dL — AB (ref 12.0–16.0)
MCH: 30.1 pg (ref 26.0–34.0)
MCHC: 33.9 g/dL (ref 32.0–36.0)
MCV: 88.9 fL (ref 80.0–100.0)
PLATELETS: 378 10*3/uL (ref 150–440)
RBC: 2.9 MIL/uL — AB (ref 3.80–5.20)
RDW: 15 % — ABNORMAL HIGH (ref 11.5–14.5)
WBC: 10.2 10*3/uL (ref 3.6–11.0)

## 2017-01-10 LAB — URINALYSIS, ROUTINE W REFLEX MICROSCOPIC
BILIRUBIN URINE: NEGATIVE
Glucose, UA: NEGATIVE mg/dL
HGB URINE DIPSTICK: NEGATIVE
KETONES UR: NEGATIVE mg/dL
Leukocytes, UA: NEGATIVE
NITRITE: NEGATIVE
PH: 7 (ref 5.0–8.0)
Protein, ur: NEGATIVE mg/dL
Specific Gravity, Urine: 1.005 (ref 1.005–1.030)

## 2017-01-10 LAB — COMPREHENSIVE METABOLIC PANEL
ALK PHOS: 75 U/L (ref 38–126)
ALT: 12 U/L — ABNORMAL LOW (ref 14–54)
ANION GAP: 11 (ref 5–15)
AST: 17 U/L (ref 15–41)
Albumin: 3.4 g/dL — ABNORMAL LOW (ref 3.5–5.0)
BILIRUBIN TOTAL: 0.2 mg/dL — AB (ref 0.3–1.2)
BUN: 16 mg/dL (ref 6–20)
CALCIUM: 8.8 mg/dL — AB (ref 8.9–10.3)
CO2: 27 mmol/L (ref 22–32)
Chloride: 92 mmol/L — ABNORMAL LOW (ref 101–111)
Creatinine, Ser: 0.85 mg/dL (ref 0.44–1.00)
GFR, EST NON AFRICAN AMERICAN: 58 mL/min — AB (ref >60)
Glucose, Bld: 144 mg/dL — ABNORMAL HIGH (ref 65–99)
Potassium: 3.8 mmol/L (ref 3.5–5.1)
Sodium: 130 mmol/L — ABNORMAL LOW (ref 135–145)
TOTAL PROTEIN: 6.9 g/dL (ref 6.5–8.1)

## 2017-01-10 LAB — TROPONIN I: Troponin I: <0.03 ng/mL (ref <0.03)

## 2017-01-10 MED ORDER — IRON 325 (65 FE) MG PO TABS: 1.0000 | ORAL_TABLET | ORAL | 1 refills | Status: DC

## 2017-01-10 MED ORDER — POLYETHYLENE GLYCOL 3350 17 G PO PACK: 17.0000 g | PACK | Freq: Every day | ORAL | 0 refills | Status: DC

## 2017-01-10 NOTE — ED Provider Notes (Addendum)
[facility] Emergency Department Provider Note  Time seen: 2:09 PM  I have reviewed the triage vital signs and the nursing notes.   HISTORY  Chief Complaint Abnormal Lab    HPI Diana [name] is a 83 y.o. female presents to the emergency department for an abnormal lab. According to the patient she saw her primary care doctor on Friday for continued right upper shoulder swelling. Patient has a history of a mastectomy on the right side with intermittent right upper extremity swelling. However for the past 2-3 weeks and has been swollen persistently which is abnormal per patient. She had blood work performed at a primary care doctor on Friday showing she is anemic with a hemoglobin of 7.4 per patient. She was called today and told to go to the emergency department. Patient states she has been feeling generalized fatigue/weakness but states has been ongoing for several months. Denies any acute exacerbation of weakness. Patient denies any history of DVT in the past. Denies any chest pain or shortness of breath.  Past Medical History:  Diagnosis Date  . Arthritis   . Bacterial pneumonia, unspecified   . Cancer , breast   . Candidiasis of mouth   . Chronic airway obstruction, not elsewhere classified    son not aware of this  . Encephalitis 1961  . Family history of adverse reaction to anesthesia    son has nausea  . Heart murmur    has had it for years; very mild AS 05/2015 echo  . Hyposmolality and/or hyponatremia   . Lumbago   . Obstructive chronic bronchitis with exacerbation ([place])   . Other abnormal glucose   . Pain in joint, shoulder region   . Palpitations   . Persistent disorder of initiating or maintaining sleep   . Restless leg syndrome   . Unspecified essential hypertension   . Unspecified hypothyroidism   . Unspecified viral infection, in conditions classified elsewhere and of unspecified site   . Unspecified vitamin D deficiency   . Urinary  tract infection, site not specified     Patient Active Problem List   Diagnosis Date Noted  . Lymphedema of right arm 11/17/2016  . Vaginal irritation 10/26/2016  . Right-sided low back pain without sciatica 08/22/2015  . Spinal stenosis in cervical region 08/07/2015  . UTI (urinary tract infection) 08/07/2015  . Abnormality of gait 07/15/2015  . Cervical spondylosis with myelopathy 06/26/2015  . Aortic valve calcification 06/03/2015  . BPV (benign positional vertigo) 01/29/2015  . Poor balance 10/07/2011  . Palpitations 04/28/2011  . Breast cancer, right breast ([place]) 03/29/2011  . Vitamin D deficiency 10/17/2009  . Prediabetes 10/17/2009  . COPD (chronic obstructive pulmonary disease) ([place]) 12/19/2007  . INSOMNIA, CHRONIC 10/25/2007  . BACK PAIN, LUMBAR 03/01/2007  . Hypothyroidism 01/18/2007  . HYPONATREMIA, MILD 01/18/2007  . Essential hypertension 01/18/2007    Past Surgical History:  Procedure Laterality Date  . ABI  04/19/05   Negative  . Back MRI  2005   mild bulging disks  . EYE SURGERY Bilateral    Cataract surgery   . HEMORRHOID SURGERY    . MASTECTOMY, RADICAL    . POSTERIOR CERVICAL LAMINECTOMY N/A 06/26/2015   Procedure: Cervical three-four POSTERIOR CERVICAL LAMINECTOMY for decompression ;  Surgeon: Kevan Ny Ditty, MD;  Location: [facility];  Service: Neurosurgery;  Laterality: N/A;  C34 laminectomy  . Venous doppler  04/19/05   LE normal    Prior to Admission medications   Medication  Sig Start Date End Date Taking? Authorizing Provider  ALPRAZolam Duanne Moron) 0.5 MG tablet Take one tablet 15-30 minutes prior to procedure.  Do Not Take and Drive. May repeat x 1 if not adequate. 06/24/16   Bedsole, Amy E, MD  amLODipine-valsartan (EXFORGE) 10-160 MG tablet Take 1 tablet by mouth daily. 12/22/16   Bedsole, Amy E, MD  diclofenac (VOLTAREN) 75 MG EC tablet Take 75 mg by mouth 2 (two) times daily.  04/06/16   [provider]  docusate sodium (COLACE) 100 MG  capsule Take 1 capsule (100 mg total) by mouth 2 (two) times daily. 06/27/15   Ditty, Kevan Ny, MD  ergocalciferol (VITAMIN D2) 50000 units capsule Take 50,000 Units by mouth once a week.    [provider]  furosemide (LASIX) 40 MG tablet TAKE 1 TABLET BY MOUTH DAILY 07/26/16   Bedsole, Amy E, MD  levothyroxine (SYNTHROID, LEVOTHROID) 100 MCG tablet TAKE 1 TABLET BY MOUTH DAILY 06/08/16   Bedsole, Amy E, MD  metoprolol succinate (TOPROL-XL) 50 MG 24 hr tablet TAKE 1 TABLET BY MOUTH DAILY 07/26/16   Bedsole, Amy E, MD  traMADol (ULTRAM) 50 MG tablet Take 1 tablet (50 mg total) by mouth every 12 (twelve) hours as needed for moderate pain or severe pain. 01/07/17   Bedsole, Amy E, MD  traZODone (DESYREL) 50 MG tablet TAKE ONE-HALF TO ONE TABLET BY MOUTH AT BEDTIME AS NEEDED FOR SLEEP 09/16/16   Bedsole, Amy E, MD  Vitamin D, Ergocalciferol, (DRISDOL) 50000 units CAPS capsule TAKE 1 CAPSULE BY MOUTH ON THE SAME DAY EVERY WEEK AS DIRECTED 08/13/16   Jinny Sanders, MD    Allergies  Allergen Reactions  . Meperidine Hcl Nausea And Vomiting    Family History  Problem Relation Age of Onset  . Rheum arthritis Father   . Stroke Mother   . Diabetes Mother   . Healthy Sister   . Healthy Sister   . Liver cancer Unknown        Aunt  . Stomach cancer Unknown        Aunt    Social History Social History  Substance Use Topics  . Smoking status: Former Smoker    Packs/day: 0.50    Years: 42.00    Types: Cigarettes  . Smokeless tobacco: Never Used  . Alcohol use No    Review of Systems Constitutional: Negative for fever. Cardiovascular: Negative for chest pain. Respiratory: Negative for shortness of breath. Gastrointestinal: Negative for abdominal pain Genitourinary: Negative for dysuria. Musculoskeletal: Right upper extremity swelling Neurological: Negative for headaches, focal weakness or numbness. Positive for generalized fatigue/weakness times several months. All other ROS  negative  ____________________________________________   PHYSICAL EXAM:  VITAL SIGNS: ED Triage Vitals  Enc Vitals Group     BP 01/10/17 1251 (!) 128/41     Pulse Rate 01/10/17 1249 63     Resp 01/10/17 1249 17     Temp --      Temp src --      SpO2 01/10/17 1249 98 %     Weight 01/10/17 1250 174 lb (78.9 kg)     Height 01/10/17 1250 5\' 4"  (1.626 m)     Head Circumference --      Peak Flow --      Pain Score --      Pain Loc --      Pain Edu? --      Excl. in [place]? --     Constitutional: Alert and oriented.  Well appearing and in no distress. Eyes: Normal exam ENT   Head: Normocephalic and atraumatic   Mouth/Throat: Mucous membranes are moist. Cardiovascular: Normal rate, regular rhythm. No murmur Respiratory: Normal respiratory effort without tachypnea nor retractions. Breath sounds are clear and equal bilaterally. No wheezes/rales/rhonchi. Gastrointestinal: Soft and nontender. No distention.   Musculoskeletal: Nontender with normal range of motion in all extremities. Patient does have moderate edema to the right upper extremity without erythema or tenderness. 2+ radial pulse. Neurologic:  Normal speech and language. No gross focal neurologic deficits are appreciated. Skin:  Skin is warm, dry and intact.  Psychiatric: Mood and affect are normal. ____________________________________________   RADIOLOGY  Ultrasound pending  EKG reviewed and interpreted by myself shows sinus rhythm at 57 bpm, narrow QRS, normal axis, normal intervals, nonspecific but no concerning ST changes. ____________________________________________   INITIAL IMPRESSION / ASSESSMENT AND PLAN / ED COURSE  Pertinent labs & imaging results that were available during my care of the patient were reviewed by me and considered in my medical decision making (see chart for details).  Patient was sent to the emergency department for a low hemoglobin level reported at 7.4 to the patient. Recheck in the  emergency department shows a hemoglobin of 8.7. Patient's rectal exam is nontender with hemorrhoids and very slightly guaiac positive. Patient states she was very constipated this morning and use several suppositories which may account for the slight guaiac positive result. Stool itself is brown in color. Patient does have right upper extremity edema which has been constant for 2-3 weeks per patient. We'll obtain a right upper extremity ultrasound to rule out DVT. Overall the patient appears very well, no distress. Labs are largely within normal limits/baseline for the patient. If the ultrasound is normal patient will be discharged home with PCP follow-up. Patient agreeable plan.  Differential this time include DVT, versus peripheral edema due to mastectomy, less likely cellulitis given normal white blood cell count and no erythema or tenderness.  Patient care signed out on coming physician ultrasound and troponin pending.  ____________________________________________   FINAL CLINICAL IMPRESSION(S) / ED DIAGNOSES  Peripheral edema    Harvest Dark, MD 01/10/17 1511    Harvest Dark, MD 01/10/17 [phone]

## 2017-01-10 NOTE — ED Triage Notes (Signed)
Patient presents to ED via POV from home. Patient was called from her pcp office due to low hemoglobin, 7.7. Patient denies dark, tarry stool or frank blood in urine.

## 2017-01-10 NOTE — ED Provider Notes (Signed)
Patient received in sign-out from Dr. Kerman Passey.  Workup and evaluation pending follow-up with upper extremity ultrasound as well as troponin. Both are reassuring and unremarkable. Patient remains human dynamically stable. Patient will follow-up with PCP. Discussed strict return precautions.  Have discussed with the patient and available family all diagnostics and treatments performed thus far and all questions were answered to the best of my ability. The patient demonstrates understanding and agreement with plan. Merlyn Lot, MD 01/10/17 [phone]

## 2017-01-13 ENCOUNTER — Ambulatory Visit: Payer: Medicare Other | Admitting: Physical Therapy

## 2017-01-16 DIAGNOSIS — R252 Cramp and spasm: Secondary | ICD-10-CM | POA: Insufficient documentation

## 2017-01-16 NOTE — Assessment & Plan Note (Signed)
Eval with labs.  May be related to low back issues and past surgery on back.  Please stop at the lab to have labs drawn. Can try tramadol for pain..  Try low dose 1/2 tab for significant pain.. Try to limit.

## 2017-01-16 NOTE — Assessment & Plan Note (Signed)
Past breast surgery and lymph node dissection.  No improvement. No ongoing infection.  Refer to lymphedema clinic for treatment.

## 2017-01-19 ENCOUNTER — Other Ambulatory Visit: Payer: Self-pay | Admitting: Family Medicine

## 2017-01-19 ENCOUNTER — Ambulatory Visit: Payer: Medicare Other | Admitting: Physical Therapy

## 2017-01-19 NOTE — Telephone Encounter (Signed)
Last office visit 01/07/2017.  Last refilled 08/13/2016 for #12 with 1 refill.  Last Vit D level 04/23/2016 with was normal at 62 ng/ml. Refill?

## 2017-01-27 ENCOUNTER — Ambulatory Visit: Payer: Medicare Other | Admitting: Physical Therapy

## 2017-01-27 ENCOUNTER — Other Ambulatory Visit: Payer: Self-pay | Admitting: Family Medicine

## 2017-02-02 ENCOUNTER — Ambulatory Visit: Payer: Medicare Other | Attending: Family Medicine | Admitting: Physical Therapy

## 2017-02-02 DIAGNOSIS — M25611 Stiffness of right shoulder, not elsewhere classified: Secondary | ICD-10-CM | POA: Insufficient documentation

## 2017-02-02 DIAGNOSIS — I890 Lymphedema, not elsewhere classified: Secondary | ICD-10-CM | POA: Insufficient documentation

## 2017-02-02 NOTE — Therapy (Addendum)
[place], [place], [address] Phone: [phone]   Fax:  [phone]  Physical Therapy Evaluation  Patient Details  Name: Diana [name] MRN: [id number] Date of Birth: Nov 10, 1923 Referring Provider: Dr. Eliezer Lofts  Encounter Date: 02/02/2017      PT End of Session - 02/02/17 1508    Visit Number 1   Number of Visits 3   Date for PT Re-Evaluation 03/18/17   PT Start Time 6945   PT Stop Time 1502   PT Time Calculation (min) 57 min   Activity Tolerance Patient tolerated treatment well   Behavior During Therapy [facility] for tasks assessed/performed      Past Medical History:  Diagnosis Date  . Arthritis   . Bacterial pneumonia, unspecified   . Cancer , breast   . Candidiasis of mouth   . Chronic airway obstruction, not elsewhere classified    son not aware of this  . Encephalitis 1961  . Family history of adverse reaction to anesthesia    son has nausea  . Heart murmur    has had it for years; very mild AS 05/2015 echo  . Hyposmolality and/or hyponatremia   . Lumbago   . Obstructive chronic bronchitis with exacerbation ([place])   . Other abnormal glucose   . Pain in joint, shoulder region   . Palpitations   . Persistent disorder of initiating or maintaining sleep   . Restless leg syndrome   . Unspecified essential hypertension   . Unspecified hypothyroidism   . Unspecified viral infection, in conditions classified elsewhere and of unspecified site   . Unspecified vitamin D deficiency   . Urinary tract infection, site not specified     Past Surgical History:  Procedure Laterality Date  . ABI  04/19/05   Negative  . Back MRI  2005   mild bulging disks  . EYE SURGERY Bilateral    Cataract surgery   . HEMORRHOID SURGERY    . MASTECTOMY, RADICAL    . POSTERIOR CERVICAL LAMINECTOMY N/A 06/26/2015   Procedure: Cervical three-four POSTERIOR CERVICAL LAMINECTOMY for decompression ;  Surgeon:  Kevan Ny Ditty, MD;  Location: [facility];  Service: Neurosurgery;  Laterality: N/A;  C34 laminectomy  . Venous doppler  04/19/05   LE normal    There were no vitals filed for this visit.       Subjective Assessment - 02/02/17 1409    Subjective "I have arthritis of the spine to start with.  My doctor operated on my neck because it got to where I couldn't use my arms and hands at all." also c/o leg pain.  Has had injections in her back and knee. This arm has swelled up and I don't know why.  Right arm started swelling almost 2 months ago.  She had a mastectomy in 1986.  She had a lot of trouble with lymphedema and infection at that time, but hasn't had problems since.  The arm swelled up after they changed her blood pressure medicine. Because of the arthritis in her shoulder she can't elevate it. All the lymph nodes were removed. Prior to this, whenever it would swell, it would get an infection right away, but this time it hasn't had an infection. (Son provides much of this information.)   Patient is accompained by: Family member  son   Pertinent History Right mastectomy 1986 with ALND with no other treatment. Neck surgery about 2 years ago. HTN controlled with meds; hypothyroid;  heart murmur. No joint replacements but has had arthroscopy in right knee; arthritis affects a lot of joints.   Patient Stated Goals To get my arm to go down (swelling out of it).   Currently in Pain? Yes   Pain Score --  not rated   Pain Location Shoulder  and elbow   Pain Orientation Right   Pain Descriptors / Indicators Aching   Aggravating Factors  arm up on armrest of wheelchair   Pain Relieving Factors moving the arm, changing positions            [facility] PT Assessment - 02/02/17 0001      Assessment   Medical Diagnosis right arm lymphedema   Referring Provider Dr. Eliezer Lofts   Onset Date/Surgical Date --  1986 mastectomy and ALND   Hand Dominance Right   Prior Therapy none     Precautions    Precautions Other (comment)   Precaution Comments cancer precautions     Restrictions   Weight Bearing Restrictions No     Balance Screen   Has the patient fallen in the past 6 months No   Has the patient had a decrease in activity level because of a fear of falling?  Yes   Is the patient reluctant to leave their home because of a fear of falling?  Yes     [place] Private residence   Living Arrangements Children   Type of [place] to enter   Entrance Stairs-Number of Steps 1  gets help to go up the step   Home Layout One level     Prior Function   Level of Independence Independent with basic ADLs;Independent with household mobility with device;Needs assistance with homemaking   Meal Prep --  does do some cooking   Leisure just daily activities     Cognition   Overall Cognitive Status Within Functional Limits for tasks assessed     Observation/Other Assessments   Skin Integrity skin intact; slightly pink at forearm on right, but also on left arm   Other Surveys  --  lymph life impact scale score of 35 + 58% impairment (15/18)     Posture/Postural Control   Posture/Postural Control Postural limitations   Postural Limitations Rounded Shoulders;Forward head;Increased thoracic kyphosis     ROM / Strength   AROM / PROM / Strength AROM     AROM   AROM Assessment Site Shoulder   Right/Left Shoulder Right;Left   Right Shoulder Flexion 68 Degrees  in sitting: painful   Right Shoulder ABduction 80 Degrees  in sitting: painful, and leans trunk to left   Left Shoulder Flexion 128 Degrees  moves toward scaption   Left Shoulder ABduction 148 Degrees  tends toward scaption     Ambulation/Gait   Ambulation/Gait --  not viewed today--didn't bring walker   Assistive device 4-wheeled walker   Gait Comments in wheelchair today; says she uses walker for balance and for LE weakness           LYMPHEDEMA/ONCOLOGY  QUESTIONNAIRE - 02/02/17 1432      Type   Cancer Type right breast     Surgeries   Mastectomy Date --  1986   Axillary Lymph Node Dissection Date --  1986   Number Lymph Nodes Removed --  all     Treatment   Past Chemotherapy Treatment No   Past Radiation Treatment No     What other symptoms do  you have   Are you Having Heaviness or Tightness Yes   Are you having Pain Yes   Are you having pitting edema Yes   Do you have infections No  did have in the past   [place] Sign No     Lymphedema Assessments   Lymphedema Assessments Upper extremities     Right Upper Extremity Lymphedema   15 cm Proximal to Olecranon Process 33.6 cm   10 cm Proximal to Olecranon Process 31 cm   Olecranon Process 31 cm   15 cm Proximal to Ulnar Styloid Process 29.8 cm   10 cm Proximal to Ulnar Styloid Process 26.5 cm   Just Proximal to Ulnar Styloid Process 21.3 cm   Across Hand at [employer] 18.8 cm   At [place] of 2nd Digit 6.1 cm     Left Upper Extremity Lymphedema   15 cm Proximal to Olecranon Process 31.9 cm   10 cm Proximal to Olecranon Process 29.4 cm   Olecranon Process 24.3 cm   15 cm Proximal to Ulnar Styloid Process 22.2 cm   10 cm Proximal to Ulnar Styloid Process 19.1 cm   Just Proximal to Ulnar Styloid Process 16.3 cm   Across Hand at [employer] 17.8 cm   At [place] of 2nd Digit 5.9 cm         Objective measurements completed on examination: See above findings.                  PT Education - 02/02/17 1506    Education provided Yes   Education Details about treatment options; about Circaid reduction kit, showing them a sample and explaining how it works and how to obtain one; lymphedema risk reduction info, pointing out infection risk reduction; [facility] lymphedema pamphlet   Person(s) Educated Patient;Child(ren)   Methods Explanation   Comprehension Verbalized understanding                Long Term Clinic Goals - 02/02/17 1708      CC Long  Term Goal  #1   Title Patient will have obtained a Circaid reduction kit for her right arm and hand, and have used it at home.   Time 4   Period Weeks   Status New     CC Long Term Goal  #2   Title Pt.'s right arm circumferences will have reduced by at least 1 cm. at 10 and 15 cm. superior to right ulnar styloid.   Baseline 26.5 cm. at 10 cm. superior to ulnar styloid and 29.8 cm. at 15 cm. superior to ulnar styloid on right arm at eval   Time 4   Period Weeks   Status New     CC Long Term Goal  #3   Title Pt. and her son will be knowledgeable about how and where to obtain a compression sleeve and glove or gauntlet if they choose to pursue this.   Time 4   Period Weeks   Status New             Plan - 02/02/17 1511    Clinical Impression Statement This is a pleasant woman looking  younger than her 83 year-old age and accompanied by her helpful son, who has lived with her since her husband passed 3 years ago.  She had mastectomy with ALND for right breast cancer in 1986 and had multiple infection episodes, though not recently. Arm swelling developed 2 months ago after a change in blood pressure  medication.  Her swelling is significant, especially at forearm, where right is 7 cm. larger than left in circumference.  She has not used compression in the past, as she reports only having had infection, not swelling. She also has limited right arm A/ROM and function, which she says is a result of the swelling.   History and Personal Factors relevant to plan of care: pt. needs assist with some of her care; mobility is limited; h/o multiple infections in right arm   Clinical Presentation Evolving   Clinical Presentation due to: relatively recent onset of swelling that is not yet controlled   Clinical Decision Making Moderate   Rehab Potential Good   Clinical Impairments Affecting Rehab Potential limited mobility (walker and wheelchair); needs rides given by her son who works   PT Frequency --   1-2 more visits as needed   PT Duration 4 weeks   PT Treatment/Interventions ADLs/Self Care Home Management;DME Instruction;Therapeutic exercise;Patient/family education;Orthotic Fit/Training;Manual techniques;Manual lymph drainage;Compression bandaging;Passive range of motion   PT Next Visit Plan Pt. and her son plan for her to be measured for a Circaid reduction kit at [facility] and to use that for 2-3 weeks, then return here for recheck of her circumference measurements. At that time, discuss whether the reduction kit is working and whether/when she might want to get a compression sleeve and glove or gauntlet.   Recommended Other Services fitting at [facility] of Circaid reduction kit   Consulted and Agree with Plan of Care Patient;Family member/caregiver      Patient will benefit from skilled therapeutic intervention in order to improve the following deficits and impairments:  Increased edema, Impaired UE functional use, Decreased knowledge of precautions, Decreased knowledge of use of DME  Visit Diagnosis: Lymphedema, not elsewhere classified - Plan: PT plan of care cert/re-cert  Stiffness of right shoulder, not elsewhere classified - Plan: PT plan of care cert/re-cert      G-Codes - 10/12/92 1508    Functional Assessment Tool Used (Outpatient Only) lymphedema life impact scale, with 15 of 18 questions answered   Functional Limitation Self care  for lymphedema   Self Care Current Status (W5462) At least 40 percent but less than 60 percent impaired, limited or restricted   Self Care Goal Status (V0350) At least 20 percent but less than 40 percent impaired, limited or restricted       Problem List Patient Active Problem List   Diagnosis Date Noted  . Leg cramps 01/16/2017  . Lymphedema of right arm 11/17/2016  . Vaginal irritation 10/26/2016  . Right-sided low back pain without sciatica 08/22/2015  . Spinal stenosis in cervical region 08/07/2015  . UTI (urinary  tract infection) 08/07/2015  . Abnormality of gait 07/15/2015  . Cervical spondylosis with myelopathy 06/26/2015  . Aortic valve calcification 06/03/2015  . BPV (benign positional vertigo) 01/29/2015  . Poor balance 10/07/2011  . Palpitations 04/28/2011  . Breast cancer, right breast ([place]) 03/29/2011  . Vitamin D deficiency 10/17/2009  . Prediabetes 10/17/2009  . COPD (chronic obstructive pulmonary disease) ([place]) 12/19/2007  . INSOMNIA, CHRONIC 10/25/2007  . BACK PAIN, LUMBAR 03/01/2007  . Hypothyroidism 01/18/2007  . HYPONATREMIA, MILD 01/18/2007  . Essential hypertension 01/18/2007    Hien Cunliffe 02/02/2017, 5:14 PM  [place], [place], [address] Phone: [phone]   Fax:  [phone]  Name: Diana [name] MRN: [id number] Date of Birth: [birth year]  Serafina Royals, PT 02/02/17 5:14 PM  PHYSICAL  THERAPY DISCHARGE SUMMARY  Visits from Start of Care: 1  Current functional level related to goals / functional outcomes: Unable to assess, as patient did not return nor contact us after the initial evaluation.   Remaining deficits: Unknown   Education / Equipment: About the availability of Circaid reduction kit for decreasing lymphedema swelling, and about suggestion for longterm compression garment. Plan: Patient agrees to discharge.  Patient goals were not met. Patient is being discharged due to not returning since the last visit.  ?????    Serafina Royals, PT 08/04/17 2:34 PM

## 2017-02-07 ENCOUNTER — Ambulatory Visit: Payer: Self-pay

## 2017-02-07 DIAGNOSIS — I890 Lymphedema, not elsewhere classified: Secondary | ICD-10-CM

## 2017-02-07 NOTE — Telephone Encounter (Signed)
Spoke with pt is requesting in home "therapy" for right arm edema  Spoke with son Diana [name] who stated Mother was seen at lymphedema clinic last week. He stated it was recommended that his mother have therapy 3 x/week. Stated it was too hard for her to go out for therapy and is requesting home therapy.

## 2017-02-07 NOTE — Telephone Encounter (Signed)
Please find out how this should be ordered? Physical therapy or other?

## 2017-02-08 NOTE — Addendum Note (Signed)
Addended by: Eliezer Lofts E on: 02/08/2017 10:04 AM   Modules accepted: Orders

## 2017-02-10 ENCOUNTER — Telehealth: Payer: Self-pay

## 2017-02-10 NOTE — Telephone Encounter (Signed)
Copied from [place] #[id number]. Topic: Quick Communication - See Telephone Encounter >> Feb 10, 2017  9:12 AM Burnis Medin, NT wrote: CRM for notification. See Telephone encounter for:  02/10/17. Pt.'s son called and said he was waiting for a callback regarding his mother getting Physical Therapy at home. Pt mother is not able to got out to a facility to complete theraphy.

## 2017-02-10 NOTE — Telephone Encounter (Signed)
Referral has been ordered.  Message routed to [facility] for follow up.

## 2017-02-10 NOTE — Telephone Encounter (Signed)
Called patients son and told him I would call Home Health agency to see if they could do Lymphedema Therapy at home. I will call him back when [facility] calls me. Dr Diona Browner will then have to place a [facility] Referral if they can do it at home.

## 2017-02-11 NOTE — Telephone Encounter (Signed)
Received a call back from [place], Melissa Stenson, unfortunately [facility] does not do Lymphedema Therapy. I called patients son to let him know. He will talk to his mother and see if she wants to continue the therapy at [facility] as they didn't make a followup appointment. He will call back if we need a New referral back to [facility].

## 2017-03-23 ENCOUNTER — Other Ambulatory Visit: Payer: Self-pay | Admitting: Family Medicine

## 2017-04-13 ENCOUNTER — Telehealth: Payer: Self-pay | Admitting: Family Medicine

## 2017-04-13 NOTE — Telephone Encounter (Signed)
Handicap Placard Application placed in Dr. Rometta Emery in box for signature.  She is not back in the office until 04/22/17.

## 2017-04-13 NOTE — Telephone Encounter (Signed)
Patient's son dropped off a disability parking placard form for patient.  When form is ready for pick up, please call patient's son [name] at [phone]. Form placed in RX tower.

## 2017-04-18 DIAGNOSIS — M25561 Pain in right knee: Secondary | ICD-10-CM | POA: Diagnosis not present

## 2017-04-18 DIAGNOSIS — M25562 Pain in left knee: Secondary | ICD-10-CM | POA: Diagnosis not present

## 2017-04-25 NOTE — Telephone Encounter (Signed)
I spoke to Bruce and let him know form is ready for pick up.

## 2017-06-01 ENCOUNTER — Telehealth: Payer: Self-pay | Admitting: Family Medicine

## 2017-06-01 MED ORDER — LEVOTHYROXINE SODIUM 100 MCG PO TABS: 100.0000 ug | ORAL_TABLET | Freq: Every day | ORAL | 1 refills | Status: DC

## 2017-06-01 NOTE — Telephone Encounter (Signed)
Pt and son, [name], stopped in to make sure pharmacy is updated. Since [facility] closed he wants to use [facility]- do not send to [facility].

## 2017-06-01 NOTE — Telephone Encounter (Signed)
PT is requesting a refill of levothyroxine 158mcg. Pt has 4 pills left but shows no refills.

## 2017-06-01 NOTE — Addendum Note (Signed)
Addended by: Carter Kitten on: 06/01/2017 05:54 PM   Modules accepted: Orders

## 2017-06-01 NOTE — Telephone Encounter (Signed)
Pharmacy updated in [place].  Levothyroxine refill sent to [facility] as requested.

## 2017-06-07 ENCOUNTER — Ambulatory Visit: Payer: Self-pay | Admitting: *Deleted

## 2017-06-07 NOTE — Telephone Encounter (Signed)
Pt reports mild intermittent "lightheadedness" for 1- 1/2  years. Reports "comes and goes."  B/P checked last week by her son; States "It was ok, can't remember what it was." Unable to check during call as she needs assist to do so. Pt states "I don't always take all my medications."  Denies any other symptoms, no CP, headache, nausea, weakness, speech difficulties. States is staying hydrated. States "always has pain in legs and back from arthritis."  Pt states can not make appt until next week. Appt made with Dr. Diona Browner for next Thursday, date per pts request. Care advice given, instructed to call back if symptoms worsen.  Reason for Disposition . Dizziness is a chronic symptom (recurrent or ongoing AND present > 4 weeks)  Answer Assessment - Initial Assessment Questions 1. DESCRIPTION: "Describe your dizziness."     Lightheadedness, "off and on for a year to a  year and a half."" 2. LIGHTHEADED: "Do you feel lightheaded?" (e.g., somewhat faint, woozy, weak upon standing)    Positional "mostly" 3. VERTIGO: "Do you feel like either you or the room is spinning or tilting?" (i.e. vertigo)     No 4. SEVERITY: "How bad is it?"  "Do you feel like you are going to faint?" "Can you stand and walk?"   - MILD - walking normally   - MODERATE - interferes with normal activities (e.g., work, school)    - SEVERE - unable to stand, requires support to walk, feels like passing out now.      Unable to stand with arthritis pain. Dizziness mild. 5. ONSET:  "When did the dizziness begin?"     1  To 1.5 years ago 6. AGGRAVATING FACTORS: "Does anything make it worse?" (e.g., standing, change in head position)     Standing 7. HEART RATE: "Can you tell me your heart rate?" "How many beats in 15 seconds?"  (Note: not all patients can do this)       No 8. CAUSE: "What do you think is causing the dizziness?"     Need medications reviewed; states she doesn't always take them all. 9. RECURRENT SYMPTOM: "Have you had  dizziness before?" If so, ask: "When was the last time?" "What happened that time?"     Off and on 10. OTHER SYMPTOMS: "Do you have any other symptoms?" (e.g., fever, chest pain, vomiting, diarrhea, bleeding)       No  Protocols used: DIZZINESS Heidi Dach

## 2017-06-16 ENCOUNTER — Other Ambulatory Visit: Payer: Self-pay

## 2017-06-16 ENCOUNTER — Ambulatory Visit (INDEPENDENT_AMBULATORY_CARE_PROVIDER_SITE_OTHER): Payer: Medicare Other | Admitting: Family Medicine

## 2017-06-16 ENCOUNTER — Ambulatory Visit: Payer: Medicare Other | Admitting: Family Medicine

## 2017-06-16 ENCOUNTER — Encounter: Payer: Self-pay | Admitting: Family Medicine

## 2017-06-16 VITALS — BP 120/80 | HR 63 | Temp 98.5°F | Ht 63.0 in | Wt 171.0 lb

## 2017-06-16 DIAGNOSIS — I10 Essential (primary) hypertension: Secondary | ICD-10-CM | POA: Diagnosis not present

## 2017-06-16 DIAGNOSIS — R42 Dizziness and giddiness: Secondary | ICD-10-CM

## 2017-06-16 DIAGNOSIS — D500 Iron deficiency anemia secondary to blood loss (chronic): Secondary | ICD-10-CM | POA: Insufficient documentation

## 2017-06-16 LAB — COMPREHENSIVE METABOLIC PANEL
ALBUMIN: 3.9 g/dL (ref 3.5–5.2)
ALT: 11 U/L (ref 0–35)
AST: 14 U/L (ref 0–37)
Alkaline Phosphatase: 72 U/L (ref 39–117)
BUN: 18 mg/dL (ref 6–23)
CALCIUM: 9.7 mg/dL (ref 8.4–10.5)
CHLORIDE: 97 meq/L (ref 96–112)
CO2: 31 meq/L (ref 19–32)
CREATININE: 0.89 mg/dL (ref 0.40–1.20)
GFR: 63.11 mL/min (ref >60.00)
Glucose, Bld: 90 mg/dL (ref 70–99)
POTASSIUM: 4 meq/L (ref 3.5–5.1)
Sodium: 134 mEq/L — ABNORMAL LOW (ref 135–145)
Total Bilirubin: 0.3 mg/dL (ref 0.2–1.2)
Total Protein: 7.6 g/dL (ref 6.0–8.3)

## 2017-06-16 LAB — CBC WITH DIFFERENTIAL/PLATELET
BASOS ABS: 0.1 10*3/uL (ref 0.0–0.1)
Basophils Relative: 0.6 % (ref 0.0–3.0)
EOS ABS: 0.1 10*3/uL (ref 0.0–0.7)
Eosinophils Relative: 1.1 % (ref 0.0–5.0)
HEMATOCRIT: 37.2 % (ref 36.0–46.0)
Hemoglobin: 12.4 g/dL (ref 12.0–15.0)
LYMPHS PCT: 25.3 % (ref 12.0–46.0)
Lymphs Abs: 2.1 10*3/uL (ref 0.7–4.0)
MCHC: 33.3 g/dL (ref 30.0–36.0)
MCV: 92.3 fl (ref 78.0–100.0)
MONO ABS: 0.7 10*3/uL (ref 0.1–1.0)
Monocytes Relative: 7.9 % (ref 3.0–12.0)
NEUTROS ABS: 5.3 10*3/uL (ref 1.4–7.7)
Neutrophils Relative %: 65.1 % (ref 43.0–77.0)
PLATELETS: 241 10*3/uL (ref 150.0–400.0)
RBC: 4.03 Mil/uL (ref 3.87–5.11)
RDW: 15.9 % — AB (ref 11.5–15.5)
WBC: 8.2 10*3/uL (ref 4.0–10.5)

## 2017-06-16 LAB — IBC PANEL
IRON: 99 ug/dL (ref 42–145)
SATURATION RATIOS: 31.4 % (ref 20.0–50.0)
TRANSFERRIN: 225 mg/dL (ref 212.0–360.0)

## 2017-06-16 LAB — FERRITIN: Ferritin: 49 ng/mL (ref 10.0–291.0)

## 2017-06-16 MED ORDER — LOSARTAN POTASSIUM-HCTZ 100-25 MG PO TABS: 1.0000 | ORAL_TABLET | Freq: Every day | ORAL | 3 refills | Status: DC

## 2017-06-16 MED ORDER — AMLODIPINE BESYLATE 10 MG PO TABS: 10.0000 mg | ORAL_TABLET | Freq: Every day | ORAL | 3 refills | Status: DC

## 2017-06-16 NOTE — Progress Notes (Signed)
Subjective:    Patient ID: Diana [name], female    DOB: 04-17-1924, 84 y.o.   MRN: [id number]  HPI   84 year old female with history for hypothyroid, COPD, hx of hyponatremia presents with chronic lightheadedness. She reports she has noted this for > 1 year.   She did have ER visit 01/10/2018  For Hemoglobin that was 7.9.. Found to be anemic, ractal exam positive for micro blood.  She has stopped her iron, tramadol.. She feels " swimmyheaded feeling got better"  Tylenol helps wit body pain  She still has balance issues.  BP Readings from Last 3 Encounters:  06/16/17 120/80  01/10/17 131/65  01/07/17 118/70   Changed to amlodipine and valsartan when losartan was on recall. Now very costly with  Different pharmacy.  Blood pressure 120/80, pulse 63, temperature 98.5 F (36.9 C), temperature source Oral, height 5\' 3"  (1.6 m), weight 171 lb (77.6 kg).  Review of Systems  Constitutional: Negative for fatigue and fever.  HENT: Negative for congestion and ear pain.   Eyes: Negative for pain.  Respiratory: Negative for cough, chest tightness and shortness of breath.   Cardiovascular: Negative for chest pain, palpitations and leg swelling.  Gastrointestinal: Negative for abdominal pain.  Genitourinary: Negative for dysuria and vaginal bleeding.  Musculoskeletal: Negative for back pain.  Neurological: Negative for syncope, light-headedness and headaches.  Psychiatric/Behavioral: Negative for dysphoric mood.       Objective:   Physical Exam  Constitutional: She is oriented to person, place, and time. Vital signs are normal. She appears well-developed and well-nourished. She is cooperative.  Non-toxic appearance. She does not appear ill. No distress.  obese  HENT:  Head: Normocephalic and atraumatic.  Right Ear: Hearing, tympanic membrane, external ear and ear canal normal. Tympanic membrane is not erythematous, not retracted and not bulging.  Left Ear: Hearing, tympanic  membrane, external ear and ear canal normal. Tympanic membrane is not erythematous, not retracted and not bulging.  Nose: No mucosal edema or rhinorrhea. Right sinus exhibits no maxillary sinus tenderness and no frontal sinus tenderness. Left sinus exhibits no maxillary sinus tenderness and no frontal sinus tenderness.  Mouth/Throat: Uvula is midline, oropharynx is clear and moist and mucous membranes are normal. No oropharyngeal exudate.  Very hard of hearing  Eyes: Pupils are equal, round, and reactive to light. Conjunctivae, EOM and lids are normal. Lids are everted and swept, no foreign bodies found.  Neck: Trachea normal and normal range of motion. Neck supple. Carotid bruit is not present. No thyroid mass and no thyromegaly present.  Cardiovascular: Normal rate, regular rhythm, S1 normal, S2 normal, normal heart sounds, intact distal pulses and normal pulses. Exam reveals no gallop and no friction rub.  No murmur heard. Pulmonary/Chest: Effort normal and breath sounds normal. No tachypnea. No respiratory distress. She has no decreased breath sounds. She has no wheezes. She has no rhonchi. She has no rales.  Abdominal: Soft. Normal appearance and bowel sounds are normal. There is no tenderness.  Musculoskeletal: She exhibits edema.       Lumbar back: She exhibits decreased range of motion.  Pitting edema R upper extremity from hand to shoulder, NO tenderness but no erythema or warmth No significant pedal edema BLE  Neurological: She is alert and oriented to person, place, and time. She has normal strength. She displays atrophy. A sensory deficit is present. She exhibits normal muscle tone. Gait abnormal.  Strength normal but very limited low body endurance  Skin: Skin  is warm, dry and intact. No rash noted.  Psychiatric: Her speech is normal and behavior is normal. Judgment and thought content normal. Her mood appears not anxious. Cognition and memory are normal. She does not exhibit a  depressed mood.  Nursing note and vitals reviewed.         Assessment & Plan:

## 2017-06-16 NOTE — Patient Instructions (Signed)
Please stop at the lab to have labs drawn.  Stop combo blood pressure pill.  Start back losartan and amlodipine.  Stay off tramadol.  Restart iron.

## 2017-07-06 ENCOUNTER — Other Ambulatory Visit: Payer: Self-pay | Admitting: *Deleted

## 2017-07-06 NOTE — Telephone Encounter (Signed)
Last office visit 06/16/2017.  Last Vit D level 04/23/2016 normal at 62 ng/ml.  Ok to refill?

## 2017-07-07 MED ORDER — ERGOCALCIFEROL 1.25 MG (50000 UT) PO CAPS: 50000.0000 [IU] | ORAL_CAPSULE | ORAL | 1 refills | Status: DC

## 2017-07-19 ENCOUNTER — Other Ambulatory Visit: Payer: Self-pay

## 2017-07-19 MED ORDER — IRON 325 (65 FE) MG PO TABS: 1.0000 | ORAL_TABLET | ORAL | 3 refills | Status: DC

## 2017-07-19 MED ORDER — METOPROLOL SUCCINATE ER 50 MG PO TB24: 50.0000 mg | ORAL_TABLET | Freq: Every day | ORAL | 1 refills | Status: DC

## 2017-07-19 NOTE — Addendum Note (Signed)
Addended by: Carter Kitten on: 07/19/2017 09:38 AM   Modules accepted: Orders

## 2017-07-19 NOTE — Telephone Encounter (Signed)
Diana [name] with [facility] called for refill metoprolol. Refilled per protocol.

## 2017-07-28 DIAGNOSIS — L720 Epidermal cyst: Secondary | ICD-10-CM | POA: Diagnosis not present

## 2017-07-28 DIAGNOSIS — D2239 Melanocytic nevi of other parts of face: Secondary | ICD-10-CM | POA: Diagnosis not present

## 2017-08-12 NOTE — Assessment & Plan Note (Signed)
Return to previous more affordable medicaitons for BP.

## 2017-08-12 NOTE — Assessment & Plan Note (Signed)
Likely multifactorial.. eval with labs and stop posibel meds contributing.

## 2017-08-12 NOTE — Assessment & Plan Note (Signed)
Due for re-eval.. Was on  Iron, now stopped.

## 2017-08-26 ENCOUNTER — Other Ambulatory Visit: Payer: Self-pay | Admitting: Family Medicine

## 2017-09-15 ENCOUNTER — Ambulatory Visit: Payer: Medicare Other | Admitting: Family Medicine

## 2017-09-16 ENCOUNTER — Encounter: Payer: Self-pay | Admitting: Family Medicine

## 2017-09-16 ENCOUNTER — Ambulatory Visit (INDEPENDENT_AMBULATORY_CARE_PROVIDER_SITE_OTHER): Payer: Medicare Other | Admitting: Family Medicine

## 2017-09-16 DIAGNOSIS — G8929 Other chronic pain: Secondary | ICD-10-CM

## 2017-09-16 DIAGNOSIS — M5442 Lumbago with sciatica, left side: Secondary | ICD-10-CM | POA: Diagnosis not present

## 2017-09-16 DIAGNOSIS — I10 Essential (primary) hypertension: Secondary | ICD-10-CM

## 2017-09-16 DIAGNOSIS — G4700 Insomnia, unspecified: Secondary | ICD-10-CM

## 2017-09-16 DIAGNOSIS — M5441 Lumbago with sciatica, right side: Secondary | ICD-10-CM | POA: Diagnosis not present

## 2017-09-16 NOTE — Patient Instructions (Addendum)
Use tylenol arthritis  650 mg 1-2 capsule every 8 hours for pain.  Can use tylenol at bedtime along with trazodone. Can also try trazodone 1 full tablet at bedtime for sleep.

## 2017-09-16 NOTE — Assessment & Plan Note (Signed)
Increase tyelnol to 2 cap three times daily for pain. Tramadol gave SE. Pt requested vicodin, but discussed issues associated with this high risk med.

## 2017-09-16 NOTE — Progress Notes (Signed)
Subjective:    Patient ID: Diana [name], female    DOB: 10-06-1923, 84 y.o.   MRN: [id number]  HPI   84 year old female presents for 3 month follow up.  She is having trouble getting around given PA in back and legs. Using walker. Trouble sleeping at night, wakes up frequently.. Able to sleep better if taking 1/2 tab of trazodone  Using tylenol as needed for pain.   Tramadol was making her feel swimmyheaded.  Hypertension:  Good control on losartan/HCTZ, metoprolol and amlodipine Using medication without problems or lightheadedness:  none Chest pain with exertion: none Edema:none Short of breath: none Average home BPs: Other issues:  Blood pressure 140/70, pulse 60, temperature (!) 97.3 F (36.3 C), temperature source Oral, height 5\' 3"  (1.6 m), weight 171 lb 8 oz (77.8 kg).   Review of Systems  Constitutional: Negative for fatigue and fever.  HENT: Negative for congestion.   Eyes: Negative for pain.  Respiratory: Negative for cough and shortness of breath.   Cardiovascular: Negative for chest pain, palpitations and leg swelling.  Gastrointestinal: Negative for abdominal pain.  Genitourinary: Negative for dysuria and vaginal bleeding.  Musculoskeletal: Positive for arthralgias and back pain.  Neurological: Negative for syncope, light-headedness and headaches.  Psychiatric/Behavioral: Negative for dysphoric mood.       Objective:   Physical Exam  Constitutional: She is oriented to person, place, and time. Vital signs are normal. She appears well-developed and well-nourished. She is cooperative.  Non-toxic appearance. She does not appear ill. No distress.  obese  HENT:  Head: Normocephalic and atraumatic.  Right Ear: Hearing, tympanic membrane, external ear and ear canal normal. Tympanic membrane is not erythematous, not retracted and not bulging.  Left Ear: Hearing, tympanic membrane, external ear and ear canal normal. Tympanic membrane is not erythematous, not  retracted and not bulging.  Nose: No mucosal edema or rhinorrhea. Right sinus exhibits no maxillary sinus tenderness and no frontal sinus tenderness. Left sinus exhibits no maxillary sinus tenderness and no frontal sinus tenderness.  Mouth/Throat: Uvula is midline, oropharynx is clear and moist and mucous membranes are normal. No oropharyngeal exudate.  Very hard of hearing  Eyes: Pupils are equal, round, and reactive to light. Conjunctivae, EOM and lids are normal. Lids are everted and swept, no foreign bodies found.  Neck: Trachea normal and normal range of motion. Neck supple. Carotid bruit is not present. No thyroid mass and no thyromegaly present.  Cardiovascular: Normal rate, regular rhythm, S1 normal, S2 normal, normal heart sounds, intact distal pulses and normal pulses. Exam reveals no gallop and no friction rub.  No murmur heard. Pulmonary/Chest: Effort normal and breath sounds normal. No tachypnea. No respiratory distress. She has no decreased breath sounds. She has no wheezes. She has no rhonchi. She has no rales.  Abdominal: Soft. Normal appearance and bowel sounds are normal. There is no tenderness.  Musculoskeletal:       Lumbar back: She exhibits decreased range of motion.  Pitting edema R upper extremity from hand to shoulder, NO tenderness but no erythema or warmth No significant pedal edema BLE  Neurological: She is alert and oriented to person, place, and time. She has normal strength. She displays atrophy. A sensory deficit is present. She exhibits normal muscle tone. Gait abnormal.  Strength normal but very limited low body endurance  Skin: Skin is warm, dry and intact. No rash noted.  Psychiatric: Her speech is normal and behavior is normal. Judgment and thought content normal.  Her mood appears not anxious. Cognition and memory are normal. She does not exhibit a depressed mood.  Nursing note and vitals reviewed.         Assessment & Plan:

## 2017-09-16 NOTE — Assessment & Plan Note (Signed)
Take tylenol at bed for pain and can increase trazodone to 1 full tab at bedtime.

## 2017-09-16 NOTE — Assessment & Plan Note (Signed)
Well controlled. Continue current medication.  

## 2017-10-17 ENCOUNTER — Other Ambulatory Visit: Payer: Self-pay | Admitting: Family Medicine

## 2017-10-31 ENCOUNTER — Other Ambulatory Visit: Payer: Self-pay | Admitting: Family Medicine

## 2017-10-31 NOTE — Telephone Encounter (Signed)
Electronic refill request Last refill 09/16/16 #30/5 Last office visit 09/16/17

## 2017-11-30 ENCOUNTER — Other Ambulatory Visit: Payer: Self-pay | Admitting: Family Medicine

## 2017-11-30 ENCOUNTER — Telehealth: Payer: Self-pay | Admitting: Family Medicine

## 2017-11-30 NOTE — Telephone Encounter (Signed)
Pt son, [name], came in to speak with someone about pt's medications. He said pt is taking Valsartan combination that was changed back in the spring. It is not on the med list and Gerald Stabs at [facility] called [place] today and was told she takes Losartan which is not correct. Pt tried Losartan but pt couldn't tolerate it and meds were changed by Dr Diona Browner, per [name].   He is requesting a call back to discuss and get a refill- pt has about 3 pills left.

## 2017-12-01 MED ORDER — AMLODIPINE BESYLATE-VALSARTAN 10-160 MG PO TABS: 1.0000 | ORAL_TABLET | Freq: Every day | ORAL | 1 refills | Status: DC

## 2017-12-01 NOTE — Telephone Encounter (Signed)
Okay to change to valsartan HCTZ.. Continue dose son says she has been taking. Follow BPs at home.

## 2017-12-01 NOTE — Telephone Encounter (Signed)
Spoke with Bruce.  He states this medication is what his mom has been taking.  She couldn't tolerate the Losartan.  Refill sent to [facility] as instructed by Dr. Diona Browner.

## 2017-12-01 NOTE — Telephone Encounter (Signed)
Per last office visit on 09/16/2017 note states patient is well controlled on Losartan-HCTZ.  I do not see any note about patient being switched back to Valsartan-HCTZ.  Ok to change to [employer].  Please advise.

## 2017-12-27 ENCOUNTER — Ambulatory Visit (INDEPENDENT_AMBULATORY_CARE_PROVIDER_SITE_OTHER): Payer: Medicare Other | Admitting: Family Medicine

## 2017-12-27 ENCOUNTER — Encounter: Payer: Self-pay | Admitting: Family Medicine

## 2017-12-27 DIAGNOSIS — J029 Acute pharyngitis, unspecified: Secondary | ICD-10-CM | POA: Diagnosis not present

## 2017-12-27 NOTE — Patient Instructions (Signed)
Start zyrtec at bedtime and can try flonase 2 sprays per nostril daily. Both are over the counter.  Can use tylenol for sore throat pain as well. Call if new shortness of breath or fever.

## 2017-12-27 NOTE — Assessment & Plan Note (Signed)
Likely due to allergies. No clear sign of infection. Start trial of non-drowsy antihistamine.

## 2017-12-27 NOTE — Progress Notes (Signed)
   Subjective:    Patient ID: Diana [name], female    DOB: 1923-05-25, 84 y.o.   MRN: [id number]  Sore Throat   This is a new problem. The current episode started in the past 7 days. The problem has been gradually worsening. Neither side of throat is experiencing more pain than the other. There has been no fever. The pain is at a severity of 5/10. Associated symptoms include congestion. Pertinent negatives include no coughing, ear discharge, ear pain, headaches, shortness of breath, swollen glands, trouble swallowing or vomiting. Associated symptoms comments: Sneezing  no heart burn. She has had no exposure to strep or mono. Treatments tried: coridcidin, cough drop. The treatment provided mild relief.    Blood pressure 140/60, pulse 66, temperature 98 F (36.7 C), temperature source Oral, height 5\' 3"  (1.6 m), weight 172 lb (78 kg).   Review of Systems  HENT: Positive for congestion. Negative for ear discharge, ear pain and trouble swallowing.   Respiratory: Negative for cough and shortness of breath.   Gastrointestinal: Negative for vomiting.  Neurological: Negative for headaches.       Objective:   Physical Exam  Constitutional: Vital signs are normal. She appears well-developed and well-nourished. She is cooperative.  Non-toxic appearance. She does not appear ill. No distress.  HENT:  Head: Normocephalic.  Right Ear: Hearing, tympanic membrane, external ear and ear canal normal. Tympanic membrane is not erythematous, not retracted and not bulging.  Left Ear: Hearing, tympanic membrane, external ear and ear canal normal. Tympanic membrane is not erythematous, not retracted and not bulging.  Nose: Mucosal edema present. No rhinorrhea. Right sinus exhibits no maxillary sinus tenderness and no frontal sinus tenderness. Left sinus exhibits no maxillary sinus tenderness and no frontal sinus tenderness.  Mouth/Throat: Uvula is midline and mucous membranes are normal. Posterior  oropharyngeal erythema present.  Eyes: Pupils are equal, round, and reactive to light. Conjunctivae, EOM and lids are normal. Lids are everted and swept, no foreign bodies found.  Neck: Trachea normal and normal range of motion. Neck supple. Carotid bruit is not present. No thyroid mass and no thyromegaly present.  Cardiovascular: Normal rate, regular rhythm, S1 normal, S2 normal, normal heart sounds, intact distal pulses and normal pulses. Exam reveals no gallop and no friction rub.  No murmur heard. Pulmonary/Chest: Effort normal and breath sounds normal. No tachypnea. No respiratory distress. She has no decreased breath sounds. She has no wheezes. She has no rhonchi. She has no rales.  Neurological: She is alert.  Skin: Skin is warm, dry and intact. No rash noted.  Psychiatric: Her speech is normal and behavior is normal. Judgment normal. Her mood appears not anxious. Cognition and memory are normal. She does not exhibit a depressed mood.          Assessment & Plan:

## 2018-01-03 ENCOUNTER — Ambulatory Visit (INDEPENDENT_AMBULATORY_CARE_PROVIDER_SITE_OTHER): Payer: Medicare Other | Admitting: Family Medicine

## 2018-01-03 ENCOUNTER — Ambulatory Visit: Payer: Medicare Other | Admitting: Family Medicine

## 2018-01-03 VITALS — BP 100/60 | HR 66 | Temp 98.4°F | Ht 63.0 in | Wt 172.0 lb

## 2018-01-03 DIAGNOSIS — I10 Essential (primary) hypertension: Secondary | ICD-10-CM

## 2018-01-03 DIAGNOSIS — Z23 Encounter for immunization: Secondary | ICD-10-CM | POA: Diagnosis not present

## 2018-01-03 DIAGNOSIS — Z0000 Encounter for general adult medical examination without abnormal findings: Secondary | ICD-10-CM | POA: Diagnosis not present

## 2018-01-03 DIAGNOSIS — E559 Vitamin D deficiency, unspecified: Secondary | ICD-10-CM

## 2018-01-03 DIAGNOSIS — R7303 Prediabetes: Secondary | ICD-10-CM | POA: Diagnosis not present

## 2018-01-03 DIAGNOSIS — E039 Hypothyroidism, unspecified: Secondary | ICD-10-CM | POA: Diagnosis not present

## 2018-01-03 DIAGNOSIS — D500 Iron deficiency anemia secondary to blood loss (chronic): Secondary | ICD-10-CM | POA: Diagnosis not present

## 2018-01-03 DIAGNOSIS — J449 Chronic obstructive pulmonary disease, unspecified: Secondary | ICD-10-CM

## 2018-01-03 NOTE — Patient Instructions (Addendum)
Can use 1 full tablet trazodone for sleep at night.  If labs look good.Faythe Ghee to hold iron.

## 2018-01-03 NOTE — Progress Notes (Signed)
Subjective:    Patient ID: Diana [name], female    DOB: 1923/09/22, 84 y.o.   MRN: [id number]  HPI   The patient presents for annual medicare wellness, complete physical and review of chronic health problems. He/She also has the following acute concerns today:   I have personally reviewed the Medicare Annual Wellness questionnaire and have noted 1. The patient's medical and social history 2. Their use of alcohol, tobacco or illicit drugs 3. Their current medications and supplements 4. The patient's functional ability including ADL's, fall risks, home safety risks and hearing or visual             impairment. 5. Diet and physical activities 6. Evidence for depression or mood disorders 7.         Updated provider list Cognitive evaluation was performed and recorded on pt medicare questionnaire form. The patients weight, height, BMI and visual acuity have been recorded in the chart  I have made referrals, counseling and provided education to the patient based review of the above and I have provided the pt with a written personalized care plan for preventive services.   Documentation of this information was scanned into the electronic record under the media tab.   Sore throat: better with allergy treatment.  No fever, still some mild cough.   Chronic insomnia mainly due to leg pain.. Using 1/2 tab Hypertension:   Good control today on metoprolol xl 50 mg daily,amlodipine 10  Mg,  Cozaar 100, valsartan 160 mg daily  COPD: stable, no dailycough, no wheeze  Hypothyroid : Due for  Recheck.  Fall Risk  01/03/2018 04/23/2016 01/10/2014 12/29/2012 04/05/2012  Falls in the past year? No No Yes No No  Number falls in past yr: - - 1 - -  Injury with Fall? - - Yes - -  Risk for fall due to : - - Impaired mobility Impaired mobility -  Risk for fall due to: Comment - - Uses Cane with walking Use walking cane -  Vision Screening Comments: Wears Glasses   Decreased hearing.   Depression  screen [facility] 2/9 01/03/2018 04/23/2016 01/10/2014  Decreased Interest 1 0 0  Down, Depressed, Hopeless 0 0 0  PHQ - 2 Score 1 0 0  Altered sleeping 3 - -  Tired, decreased energy 0 - -  Change in appetite 0 - -  Feeling bad or failure about yourself  0 - -  Trouble concentrating 0 - -  Moving slowly or fidgety/restless 0 - -  Suicidal thoughts 0 - -  PHQ-9 Score 4 - -  Difficult doing work/chores Not difficult at all - -    Advance directives and end of life planning reviewed in detail with patient and documented in EMR. Patient given handout on advance care directives if needed. HCPOA and living will updated if needed. Social History /Family History/Past Medical History reviewed in detail and updated in EMR if needed. Blood pressure 100/60, pulse 66, temperature 98.4 F (36.9 C), temperature source Oral, height 5\' 3"  (1.6 m), weight 172 lb (78 kg).   Review of Systems  Constitutional: Positive for fatigue. Negative for fever.  HENT: Negative for congestion.   Eyes: Negative for pain.  Respiratory: Negative for cough and shortness of breath.   Cardiovascular: Negative for chest pain, palpitations and leg swelling.  Gastrointestinal: Negative for abdominal pain.  Genitourinary: Negative for dysuria and vaginal bleeding.  Musculoskeletal: Positive for arthralgias and myalgias. Negative for back pain.  Neurological: Negative for syncope,  light-headedness and headaches.  Psychiatric/Behavioral: Negative for dysphoric mood.       Objective:   Physical Exam  Constitutional: She is oriented to person, place, and time. Vital signs are normal. She appears well-developed and well-nourished. She is cooperative.  Non-toxic appearance. She does not appear ill. No distress.  obese  HENT:  Head: Normocephalic and atraumatic.  Right Ear: Hearing, tympanic membrane, external ear and ear canal normal. Tympanic membrane is not erythematous, not retracted and not bulging.  Left Ear: Hearing, tympanic  membrane, external ear and ear canal normal. Tympanic membrane is not erythematous, not retracted and not bulging.  Nose: No mucosal edema or rhinorrhea. Right sinus exhibits no maxillary sinus tenderness and no frontal sinus tenderness. Left sinus exhibits no maxillary sinus tenderness and no frontal sinus tenderness.  Mouth/Throat: Uvula is midline, oropharynx is clear and moist and mucous membranes are normal. No oropharyngeal exudate.  Very hard of hearing  Eyes: Pupils are equal, round, and reactive to light. Conjunctivae, EOM and lids are normal. Lids are everted and swept, no foreign bodies found.  Neck: Trachea normal and normal range of motion. Neck supple. Carotid bruit is not present. No thyroid mass and no thyromegaly present.  Cardiovascular: Normal rate, regular rhythm, S1 normal, S2 normal, normal heart sounds, intact distal pulses and normal pulses. Exam reveals no gallop and no friction rub.  No murmur heard. Pulmonary/Chest: Effort normal and breath sounds normal. No tachypnea. No respiratory distress. She has no decreased breath sounds. She has no wheezes. She has no rhonchi. She has no rales.  Abdominal: Soft. Normal appearance and bowel sounds are normal. There is no tenderness.  Musculoskeletal:       Lumbar back: She exhibits decreased range of motion.  Pitting edema R upper extremity from hand to shoulder, NO tenderness but no erythema or warmth No significant pedal edema BLE  Neurological: She is alert and oriented to person, place, and time. She has normal strength. She displays atrophy. A sensory deficit is present. She exhibits normal muscle tone. Gait abnormal.  Strength normal but very limited low body endurance  Skin: Skin is warm, dry and intact. No rash noted.  Psychiatric: Her speech is normal and behavior is normal. Judgment and thought content normal. Her mood appears not anxious. Cognition and memory are normal. She does not exhibit a depressed mood.  Nursing  note and vitals reviewed.         Assessment & Plan:  The patient's preventative maintenance and recommended screening tests for an annual wellness exam were reviewed in full today. Brought up to date unless services declined.  Counselled on the importance of diet, exercise, and its role in overall health and mortality. The patient's FH and SH was reviewed, including their home life, tobacco status, and drug and alcohol status.   Vaccines: Uptodate with flu, shingles and pneumonia vaccines. Refuses Td.  Mammo: Nml 01/2011.Marland Kitchen Hx of breast cancer 1986.Marland Kitchen She is not interested in further mammograms and breast exams given she would not do aggressive treatment. Colon: last 2003, no further indicated, no family history  DVE/pap: not indicated DXA: sister nml, no family history... Has never had. She refuses this at this time.   Decreased hearing, tinnitus.

## 2018-01-04 ENCOUNTER — Encounter: Payer: Self-pay | Admitting: Family Medicine

## 2018-01-04 LAB — CBC WITH DIFFERENTIAL/PLATELET
BASOS PCT: 1.5 % (ref 0.0–3.0)
Basophils Absolute: 0.1 10*3/uL (ref 0.0–0.1)
EOS ABS: 0.2 10*3/uL (ref 0.0–0.7)
Eosinophils Relative: 1.9 % (ref 0.0–5.0)
HCT: 39.5 % (ref 36.0–46.0)
HEMOGLOBIN: 13.4 g/dL (ref 12.0–15.0)
Lymphocytes Relative: 27.6 % (ref 12.0–46.0)
Lymphs Abs: 2.4 10*3/uL (ref 0.7–4.0)
MCHC: 33.9 g/dL (ref 30.0–36.0)
MCV: 94.7 fl (ref 78.0–100.0)
Monocytes Absolute: 0.8 10*3/uL (ref 0.1–1.0)
Monocytes Relative: 9.1 % (ref 3.0–12.0)
NEUTROS ABS: 5.2 10*3/uL (ref 1.4–7.7)
Neutrophils Relative %: 59.9 % (ref 43.0–77.0)
Platelets: 249 10*3/uL (ref 150.0–400.0)
RBC: 4.17 Mil/uL (ref 3.87–5.11)
RDW: 13 % (ref 11.5–15.5)
WBC: 8.7 10*3/uL (ref 4.0–10.5)

## 2018-01-04 LAB — IBC PANEL
IRON: 55 ug/dL (ref 42–145)
Saturation Ratios: 17.9 % — ABNORMAL LOW (ref 20.0–50.0)
TRANSFERRIN: 219 mg/dL (ref 212.0–360.0)

## 2018-01-04 LAB — TSH: TSH: 1.7 u[IU]/mL (ref 0.35–4.50)

## 2018-01-04 LAB — HEMOGLOBIN A1C: HEMOGLOBIN A1C: 6.2 % (ref 4.6–6.5)

## 2018-01-04 LAB — T4, FREE: Free T4: 1.06 ng/dL (ref 0.60–1.60)

## 2018-01-04 LAB — T3, FREE: T3, Free: 2.9 pg/mL (ref 2.3–4.2)

## 2018-01-04 LAB — VITAMIN D 25 HYDROXY (VIT D DEFICIENCY, FRACTURES): VITD: 73.62 ng/mL (ref 30.00–100.00)

## 2018-01-04 LAB — FERRITIN: Ferritin: 60.2 ng/mL (ref 10.0–291.0)

## 2018-01-04 NOTE — Assessment & Plan Note (Signed)
Well controlled. Continue current medication.  

## 2018-01-04 NOTE — Assessment & Plan Note (Signed)
Re-eval cbc and iron levels. Pt has SE to iron and wishes to stop if able.

## 2018-01-04 NOTE — Assessment & Plan Note (Signed)
Stable control on current regimen. 

## 2018-01-04 NOTE — Assessment & Plan Note (Signed)
Due for re-eval. 

## 2018-01-05 ENCOUNTER — Encounter: Payer: Self-pay | Admitting: *Deleted

## 2018-01-17 ENCOUNTER — Other Ambulatory Visit: Payer: Self-pay | Admitting: Family Medicine

## 2018-02-02 ENCOUNTER — Telehealth: Payer: Self-pay | Admitting: Family Medicine

## 2018-02-02 NOTE — Telephone Encounter (Signed)
Copied from [place] [id number]. Topic: General - Other >> Feb 02, 2018  8:31 AM Keene Breath wrote: Reason for CRM: Patient called to inform the doctor that she thinks she has a possible UTI.  Patient would like to come in to give a urine sample.  She is 78 and has limited transportation.  Please advise and call patient back to let her know what she should do.  CB# [phone].

## 2018-02-02 NOTE — Telephone Encounter (Signed)
Spoke to pt and advised OV required. Offered 1045 with Dr Diona Browner, but pt states she would not have transportation. She is going to contact her caretaker to confirm her availability and contact office back to schedule OV tomorrow.

## 2018-02-03 ENCOUNTER — Encounter: Payer: Self-pay | Admitting: Family Medicine

## 2018-02-03 ENCOUNTER — Ambulatory Visit (INDEPENDENT_AMBULATORY_CARE_PROVIDER_SITE_OTHER): Payer: Medicare Other | Admitting: Family Medicine

## 2018-02-03 VITALS — BP 138/70 | HR 63 | Temp 98.5°F | Ht 63.0 in | Wt 172.5 lb

## 2018-02-03 DIAGNOSIS — R300 Dysuria: Secondary | ICD-10-CM | POA: Diagnosis not present

## 2018-02-03 DIAGNOSIS — R21 Rash and other nonspecific skin eruption: Secondary | ICD-10-CM

## 2018-02-03 LAB — POC URINALSYSI DIPSTICK (AUTOMATED)
Bilirubin, UA: NEGATIVE
Glucose, UA: NEGATIVE
Ketones, UA: NEGATIVE
Nitrite, UA: NEGATIVE
PROTEIN UA: POSITIVE — AB
SPEC GRAV UA: 1.015 (ref 1.010–1.025)
UROBILINOGEN UA: 0.2 U/dL
pH, UA: 6 (ref 5.0–8.0)

## 2018-02-03 MED ORDER — CEPHALEXIN 500 MG PO CAPS: 500.0000 mg | ORAL_CAPSULE | Freq: Three times a day (TID) | ORAL | 0 refills | Status: DC

## 2018-02-03 MED ORDER — TRIAMCINOLONE ACETONIDE 0.5 % EX CREA: 1.0000 "application " | TOPICAL_CREAM | Freq: Two times a day (BID) | CUTANEOUS | 0 refills | Status: DC

## 2018-02-03 NOTE — Patient Instructions (Signed)
Bring in sterile urine sample before end of day for UA, micro and culture.  Start topical steroid cream on legs Twice daily x 2 weeks. Call if not improving for referral to [facility].

## 2018-02-03 NOTE — Progress Notes (Signed)
   Subjective:    Patient ID: Diana [name], female    DOB: 23-Jul-1923, 84 y.o.   MRN: [id number]  HPI    84 year old female presents with new onset burning with urination, low back pain in last 2 days.  Increase in frequency and urgency.  No fever, no abd pain No blood in urine.   She has been trying to increase water intake.  no recent UTI. No Antibiotics in last 30 days.    Also new rash on bilateral lower leg in last week.  Itching and burning.  no new lotions, no new exposure. She has been using cortisone 10.. No help.  no rash in mouth , hands or elswhere.  Review of Systems  Constitutional: Negative for fatigue and fever.  HENT: Negative for congestion.   Eyes: Negative for pain.  Respiratory: Negative for cough and shortness of breath.   Cardiovascular: Negative for chest pain, palpitations and leg swelling.  Gastrointestinal: Negative for abdominal pain.  Genitourinary: Negative for dysuria and vaginal bleeding.  Musculoskeletal: Positive for back pain.  Skin: Positive for rash.  Neurological: Negative for syncope, light-headedness and headaches.  Psychiatric/Behavioral: Negative for dysphoric mood.       Objective:   Physical Exam  Constitutional: Vital signs are normal. She appears well-developed and well-nourished. She is cooperative.  Non-toxic appearance. She does not appear ill. No distress.  Elderly female  Overweight in NAD  HENT:  Head: Normocephalic.  Right Ear: Hearing, tympanic membrane, external ear and ear canal normal. Tympanic membrane is not erythematous, not retracted and not bulging.  Left Ear: Hearing, tympanic membrane, external ear and ear canal normal. Tympanic membrane is not erythematous, not retracted and not bulging.  Nose: No mucosal edema or rhinorrhea. Right sinus exhibits no maxillary sinus tenderness and no frontal sinus tenderness. Left sinus exhibits no maxillary sinus tenderness and no frontal sinus tenderness.  Mouth/Throat:  Uvula is midline, oropharynx is clear and moist and mucous membranes are normal.  Eyes: Pupils are equal, round, and reactive to light. Conjunctivae, EOM and lids are normal. Lids are everted and swept, no foreign bodies found.  Neck: Trachea normal and normal range of motion. Neck supple. Carotid bruit is not present. No thyroid mass and no thyromegaly present.  Cardiovascular: Normal rate, regular rhythm, S1 normal, S2 normal, normal heart sounds, intact distal pulses and normal pulses. Exam reveals no gallop and no friction rub.  No murmur heard. Pulmonary/Chest: Effort normal and breath sounds normal. No tachypnea. No respiratory distress. She has no decreased breath sounds. She has no wheezes. She has no rhonchi. She has no rales.  Abdominal: Soft. Normal appearance and bowel sounds are normal. There is no tenderness.  Neurological: She is alert.  Skin: Skin is warm, dry and intact. No rash noted.   Erythematous nonblanching macules and on BIlateral lower legs, minimal peripheral edema  Psychiatric: Her speech is normal and behavior is normal. Judgment and thought content normal. Her mood appears not anxious. Cognition and memory are normal. She does not exhibit a depressed mood.          Assessment & Plan:

## 2018-02-05 LAB — URINE CULTURE
MICRO NUMBER:: 91255795
SPECIMEN QUALITY:: ADEQUATE

## 2018-02-10 ENCOUNTER — Other Ambulatory Visit: Payer: Self-pay

## 2018-02-10 NOTE — Patient Outreach (Signed)
[facility]) Care Management  02/10/2018  BAO COREAS 1923-12-23 [id number]   Medication Adherence call to Mrs. [name] spoke with patient sh eis no longer taking Losartan /HCTZ 100/25 because of side effects patient is now taking Amlodipine/Valsartan 10/160 mg. Mrs. Liberati is showing past due under [place].   [place] Management Direct Dial [phone]  Fax [phone] [email or web address]

## 2018-03-01 ENCOUNTER — Other Ambulatory Visit: Payer: Self-pay | Admitting: Family Medicine

## 2018-03-01 DIAGNOSIS — R300 Dysuria: Secondary | ICD-10-CM | POA: Insufficient documentation

## 2018-03-01 NOTE — Assessment & Plan Note (Addendum)
Unable to give Urine sample in office.. Will return with one for UA, micro and culture.

## 2018-03-01 NOTE — Assessment & Plan Note (Signed)
Unclear cause. Will start with topical steroid cream BID.. May need referral to Derm for further eval.

## 2018-03-13 ENCOUNTER — Ambulatory Visit (INDEPENDENT_AMBULATORY_CARE_PROVIDER_SITE_OTHER): Payer: Medicare Other | Admitting: Family Medicine

## 2018-03-13 ENCOUNTER — Encounter: Payer: Self-pay | Admitting: Family Medicine

## 2018-03-13 VITALS — BP 150/82 | HR 66 | Temp 97.6°F | Ht 63.0 in | Wt 170.0 lb

## 2018-03-13 DIAGNOSIS — L989 Disorder of the skin and subcutaneous tissue, unspecified: Secondary | ICD-10-CM | POA: Diagnosis not present

## 2018-03-13 NOTE — Patient Instructions (Signed)
Good to see you today  Dr. Juel Burrow office should be able to get you in with in the next couple of weeks. Tell them you are willing to see anyone in the office.   Try to put a cushioned pad (like a blister pad) on the affected area. Leave it on until it falls off.

## 2018-03-13 NOTE — Progress Notes (Signed)
Subjective:    Patient ID: Diana [name], female    DOB: [birth year], 84 y.o.   MRN: [id number]  HPI This is a 84 yo female who presents today with a spot on her nose x 2 weeks. She is accompanied by her daughter in law. She has noticed a raised, red area on the left side of nose. Painful when she wears her glasses. Gets dizzy if she doesn't wear her glasses and then she can't see. Has been seen at Dr. Juel Burrow office but was many years ago. Has applied antibacterial ointment as well as otc hydrocortisone cream without improvement.     Past Medical History:  Diagnosis Date  . Arthritis   . Bacterial pneumonia, unspecified   . Cancer , breast   . Candidiasis of mouth   . Chronic airway obstruction, not elsewhere classified    son not aware of this  . Encephalitis 1961  . Family history of adverse reaction to anesthesia    son has nausea  . Heart murmur    has had it for years; very mild AS 05/2015 echo  . Hyposmolality and/or hyponatremia   . Lumbago   . Obstructive chronic bronchitis with exacerbation ([place])   . Other abnormal glucose   . Pain in joint, shoulder region   . Palpitations   . Persistent disorder of initiating or maintaining sleep   . Restless leg syndrome   . Unspecified essential hypertension   . Unspecified hypothyroidism   . Unspecified viral infection, in conditions classified elsewhere and of unspecified site   . Unspecified vitamin D deficiency   . Urinary tract infection, site not specified    Past Surgical History:  Procedure Laterality Date  . ABI  04/19/05   Negative  . Back MRI  2005   mild bulging disks  . EYE SURGERY Bilateral    Cataract surgery   . HEMORRHOID SURGERY    . MASTECTOMY, RADICAL    . POSTERIOR CERVICAL LAMINECTOMY N/A 06/26/2015   Procedure: Cervical three-four POSTERIOR CERVICAL LAMINECTOMY for decompression ;  Surgeon: Kevan Ny Ditty, MD;  Location: [facility];  Service: Neurosurgery;  Laterality: N/A;  C34 laminectomy  .  Venous doppler  04/19/05   LE normal   Family History  Problem Relation Age of Onset  . Rheum arthritis Father   . Stroke Mother   . Diabetes Mother   . Healthy Sister   . Healthy Sister   . Liver cancer Unknown        Aunt  . Stomach cancer Unknown        Aunt   Social History   Tobacco Use  . Smoking status: Former Smoker    Packs/day: 0.50    Years: 42.00    Pack years: 21.00    Types: Cigarettes  . Smokeless tobacco: Never Used  Substance Use Topics  . Alcohol use: No  . Drug use: No      Review of Systems Per HPI    Objective:   Physical Exam  Constitutional: She is oriented to person, place, and time. She appears well-developed and well-nourished. No distress.  HENT:  Head: Normocephalic and atraumatic.    Eyes: Conjunctivae are normal.  Cardiovascular: Normal rate.  Pulmonary/Chest: Effort normal.  Neurological: She is alert and oriented to person, place, and time.  Skin: Skin is warm and dry. She is not diaphoretic.  Psychiatric: She has a normal mood and affect. Her behavior is normal. Judgment and thought content normal.  Vitals reviewed.     BP (!) 150/82 (BP Location: Left Arm, Patient Position: Sitting, Cuff Size: Normal)   Pulse 66   Temp 97.6 F (36.4 C) (Oral)   Ht 5\' 3"  (1.6 m)   Wt 170 lb (77.1 kg)   SpO2 97%   BMI 30.11 kg/m  Wt Readings from Last 3 Encounters:  03/13/18 170 lb (77.1 kg)  02/03/18 172 lb 8 oz (78.2 kg)  01/03/18 172 lb (78 kg)   BP Readings from Last 3 Encounters:  03/13/18 (!) 150/82  02/03/18 138/70  01/03/18 100/60       Assessment & Plan:  1. Lesion of skin of nose - discussed applying a padded bandage or blister pad to give her some cushioning to be able to wear her glasses - Ambulatory referral to Dermatology   Clarene Reamer, FNP-BC  [facility]  03/13/2018 3:32 PM

## 2018-03-21 ENCOUNTER — Other Ambulatory Visit: Payer: Self-pay | Admitting: Family Medicine

## 2018-03-21 NOTE — Telephone Encounter (Signed)
Last office visit 03/13/2018 with Diana [name] for Skin Lesion on nose.  Last refilled 06/28/2017 for #12 with 1 refill.  Last Vit D level normal at 73.62 ng/ml on 01/03/2018.  Refill?

## 2018-03-22 DIAGNOSIS — D485 Neoplasm of uncertain behavior of skin: Secondary | ICD-10-CM | POA: Diagnosis not present

## 2018-03-22 DIAGNOSIS — L0390 Cellulitis, unspecified: Secondary | ICD-10-CM | POA: Diagnosis not present

## 2018-03-22 DIAGNOSIS — D2239 Melanocytic nevi of other parts of face: Secondary | ICD-10-CM | POA: Diagnosis not present

## 2018-04-22 ENCOUNTER — Other Ambulatory Visit: Payer: Self-pay | Admitting: Family Medicine

## 2018-05-31 ENCOUNTER — Other Ambulatory Visit: Payer: Self-pay | Admitting: Family Medicine

## 2018-07-14 ENCOUNTER — Other Ambulatory Visit: Payer: Self-pay | Admitting: Family Medicine

## 2018-08-07 ENCOUNTER — Other Ambulatory Visit: Payer: Self-pay | Admitting: Family Medicine

## 2018-09-18 ENCOUNTER — Other Ambulatory Visit: Payer: Self-pay | Admitting: Family Medicine

## 2018-09-18 ENCOUNTER — Other Ambulatory Visit: Payer: Self-pay

## 2018-09-18 ENCOUNTER — Encounter: Payer: Self-pay | Admitting: Family Medicine

## 2018-09-18 ENCOUNTER — Ambulatory Visit (INDEPENDENT_AMBULATORY_CARE_PROVIDER_SITE_OTHER): Payer: Medicare Other | Admitting: Family Medicine

## 2018-09-18 VITALS — BP 90/70 | HR 97 | Temp 98.4°F | Ht 63.0 in | Wt 177.5 lb

## 2018-09-18 DIAGNOSIS — R600 Localized edema: Secondary | ICD-10-CM | POA: Diagnosis not present

## 2018-09-18 DIAGNOSIS — M1711 Unilateral primary osteoarthritis, right knee: Secondary | ICD-10-CM | POA: Diagnosis not present

## 2018-09-18 MED ORDER — METHYLPREDNISOLONE ACETATE 40 MG/ML IJ SUSP
80.0000 mg | Freq: Once | INTRAMUSCULAR | Status: AC
  Administered 2018-09-18: 14:00:00 80 mg via INTRA_ARTICULAR

## 2018-09-18 NOTE — Patient Instructions (Signed)
Athletic compression socks

## 2018-09-18 NOTE — Progress Notes (Signed)
Diana Kerins T. [name], MD Primary Care and [place] at [facility] [name], [address] Phone: [phone]   FAX: [place] - 85 y.o. female   MRN [id number]   Date of Birth: Feb 11, 1924  Visit Date: 09/18/2018   PCP: Jinny Sanders, MD   Referred by: Jinny Sanders, MD  Chief Complaint  Patient presents with   Knee Pain    Right   Leg Swelling   Subjective:   Diana [name] is a 85 y.o. very pleasant female patient who presents with the following:  R knee OA: The patient is an almost 85 year old with known bilateral knee osteoarthritis who presents with a walker.  She is also here with a caregiver.  She has intermittent bilateral knee pain, and right now she is having some significant right-sided knee pain.  She is not clear if this had any kind of exacerbating symptoms, but she is not had any trauma.  She has had prior corticosteroid injections done and these have had a good response.  I think based on what she is telling me that these have been done at [facility] orthopedics.  She also has bilateral lower extremity swelling, and she asked me about this and ways that this could be effectively managed or minimized.  LE swelling socks  Past Medical History, Surgical History, Social History, Family History, Problem List, Medications, and Allergies have been reviewed and updated if relevant.  Patient Active Problem List   Diagnosis Date Noted   Burning with urination 03/01/2018   Sore throat 12/27/2017   Iron deficiency anemia due to chronic blood loss 06/16/2017   Lymphedema of right arm 11/17/2016   Vaginal irritation 10/26/2016   Right-sided low back pain without sciatica 08/22/2015   Spinal stenosis in cervical region 08/07/2015   Cervical spondylosis with myelopathy 06/26/2015   Aortic valve calcification 06/03/2015   BPV (benign positional vertigo) 01/29/2015   Dizziness  08/27/2014   Rash 01/03/2013   Poor balance 10/07/2011   History of right breast cancer 03/29/2011   Vitamin D deficiency 10/17/2009   Prediabetes 10/17/2009   COPD (chronic obstructive pulmonary disease) ([place]) 12/19/2007   INSOMNIA, CHRONIC 10/25/2007   BACK PAIN, LUMBAR 03/01/2007   Hypothyroidism 01/18/2007   HYPONATREMIA, MILD 01/18/2007   Essential hypertension 01/18/2007    Past Medical History:  Diagnosis Date   Arthritis    Bacterial pneumonia, unspecified    Cancer , breast    Candidiasis of mouth    Chronic airway obstruction, not elsewhere classified    son not aware of this   Encephalitis 1961   Family history of adverse reaction to anesthesia    son has nausea   Heart murmur    has had it for years; very mild AS 05/2015 echo   Hyposmolality and/or hyponatremia    Lumbago    Obstructive chronic bronchitis with exacerbation ([place])    Other abnormal glucose    Pain in joint, shoulder region    Palpitations    Persistent disorder of initiating or maintaining sleep    Restless leg syndrome    Unspecified essential hypertension    Unspecified hypothyroidism    Unspecified viral infection, in conditions classified elsewhere and of unspecified site    Unspecified vitamin D deficiency    Urinary tract infection, site not specified     Past Surgical History:  Procedure Laterality Date   ABI  04/19/05   Negative  Back MRI  2005   mild bulging disks   EYE SURGERY Bilateral    Cataract surgery    HEMORRHOID SURGERY     MASTECTOMY, RADICAL     POSTERIOR CERVICAL LAMINECTOMY N/A 06/26/2015   Procedure: Cervical three-four POSTERIOR CERVICAL LAMINECTOMY for decompression ;  Surgeon: Kevan Ny Ditty, MD;  Location: [facility];  Service: Neurosurgery;  Laterality: N/A;  C34 laminectomy   Venous doppler  04/19/05   LE normal    Social History   Socioeconomic History   Marital status: Widowed    Spouse name: Not on file     Number of children: Not on file   Years of education: Not on file   Highest education level: Not on file  Occupational History   Not on file  Social Needs   Financial resource strain: Not on file   Food insecurity:    Worry: Not on file    Inability: Not on file   Transportation needs:    Medical: Not on file    Non-medical: Not on file  Tobacco Use   Smoking status: Former Smoker    Packs/day: 0.50    Years: 42.00    Pack years: 21.00    Types: Cigarettes   Smokeless tobacco: Never Used  Substance and Sexual Activity   Alcohol use: No   Drug use: No   Sexual activity: Not on file  Lifestyle   Physical activity:    Days per week: Not on file    Minutes per session: Not on file   Stress: Not on file  Relationships   Social connections:    Talks on phone: Not on file    Gets together: Not on file    Attends religious service: Not on file    Active member of club or organization: Not on file    Attends meetings of clubs or organizations: Not on file    Relationship status: Not on file   Intimate partner violence:    Fear of current or ex partner: Not on file    Emotionally abused: Not on file    Physically abused: Not on file    Forced sexual activity: Not on file  Other Topics Concern   Not on file  Social History Narrative   Married x60 years      No regular exercise-uses cane to ambulate, unsteady on feet      Diet: Moderately healthy, a lot of desserts       Living will, HCPOA: son: Diana [name]    Family History  Problem Relation Age of Onset   Rheum arthritis Father    Stroke Mother    Diabetes Mother    Healthy Sister    Healthy Sister    Liver cancer Unknown        Aunt   Stomach cancer Unknown        Aunt    Allergies  Allergen Reactions   Meperidine Hcl Nausea And Vomiting    Medication list reviewed and updated in full in [place].  GEN: No fevers, chills. Nontoxic. Primarily MSK c/o today. MSK:  Detailed in the HPI GI: tolerating PO intake without difficulty Neuro: No numbness, parasthesias, or tingling associated. Otherwise the pertinent positives of the ROS are noted above.   Objective:   BP 90/70    Pulse 97    Temp 98.4 F (36.9 C) (Oral)    Ht 5\' 3"  (1.6 m)    Wt 177 lb 8  oz (80.5 kg)    BMI 31.44 kg/m    GEN: WDWN, NAD, Non-toxic, Alert & Oriented x 3 HEENT: Atraumatic, Normocephalic.  Ears and Nose: No external deformity. EXTR: No clubbing/cyanosis/1+ B LE edema NEURO: Normal gait. Walker PSYCH: Normally interactive. Conversant. Not depressed or anxious appearing.  Calm demeanor.    The patient was examined in a chair, given her limited mobility.  She does have a mild effusion bilaterally.  Quite notable crepitus bilaterally.  She has significant bilateral medial joint line tenderness as well as lateral joint line tenderness.  No dramatic tenderness at the tibia or well as the proximal fibula.  Patellar tendon and quadriceps tendon are intact.  Stable to varus and valgus stress.  Radiology: No results found.  Assessment and Plan:   Primary localized osteoarthritis of right knee - Plan: methylPREDNISolone acetate (DEPO-MEDROL) injection 80 mg  Bilateral lower extremity edema  With her ongoing knee pain and decreased functionality, it is certainly reasonable to do a intra-articular corticosteroid injection in this 85 year old patient to help with any symptoms as much as possible.  With regards to her longstanding lower extremity edema, I recommended that she get some athletic compression socks, which would likely help and be much more comfortable compared to traditional medical compression socks.  Her caregiver is going to communicate this to 1 of her children.  Aspiration/Injection Procedure Note JANETT KAMATH 1923-10-14 Date of procedure: 09/18/2018  Procedure: Large Joint Aspiration / Injection of Knee, R Indications: Pain  Procedure Details Patient  verbally consented to procedure. Risks (including potential rare risk of infection), benefits, and alternatives explained. Sterilely prepped with Chloraprep. Ethyl cholride used for anesthesia. 8 cc Lidocaine 1% mixed with 2 mL Depo-Medrol 40 mg injected using the anteromedial approach without difficulty. No complications with procedure and tolerated well. Patient had decreased pain post-injection. Medication: 2 mL of Depo-Medrol 40 mg, equaling Depo-Medrol 80 mg total   Follow-up: Call or return to clinic prn if these symptoms worsen or fail to improve as anticipated.   Meds ordered this encounter  Medications   methylPREDNISolone acetate (DEPO-MEDROL) injection 80 mg   Signed,  Alane Hanssen T. Shawnya Mayor, MD   Outpatient Encounter Medications as of 09/18/2018  Medication Sig   acetaminophen (TYLENOL) 650 MG CR tablet Take 650 mg by mouth daily.   furosemide (LASIX) 40 MG tablet TAKE 1 TABLET BY MOUTH ONCE DAILY   levothyroxine (SYNTHROID, LEVOTHROID) 100 MCG tablet TAKE 1 TABLET BY MOUTH ONCE A DAY   metoprolol succinate (TOPROL-XL) 50 MG 24 hr tablet TAKE 1 TABLET BY MOUTH ONCE A DAY WITH OR IMMEDIATELY FOLLOWING A MEAL   [DISCONTINUED] amLODipine-valsartan (EXFORGE) 10-160 MG tablet TAKE 1 TABLET BY MOUTH ONCE A DAY   [DISCONTINUED] traZODone (DESYREL) 50 MG tablet TAKE ONE-HALF TO ONE TABLET BY MOUTH AT BEDTIME AS NEEDED FOR SLEEP.   [DISCONTINUED] ergocalciferol (VITAMIN D2) 50000 units capsule Take 1 capsule (50,000 Units total) by mouth once a week.   [DISCONTINUED] Ferrous Sulfate (IRON) 325 (65 Fe) MG TABS Take 1 tablet (325 mg total) by mouth every other day.   [DISCONTINUED] triamcinolone cream (KENALOG) 0.5 % Apply 1 application topically 2 (two) times daily.   [EXPIRED] methylPREDNISolone acetate (DEPO-MEDROL) injection 80 mg    No facility-administered encounter medications on file as of 09/18/2018.

## 2018-09-23 IMAGING — MR MR LUMBAR SPINE W/O CM
4 of 5 series · 25 of 48 positions shown · non-contrast
Comparison: None.

CLINICAL DATA: Lower back pain with bilateral leg pain radiating to
the feet.

EXAM:
MRI LUMBAR SPINE WITHOUT CONTRAST
TECHNIQUE: Multiplanar, multisequence MR imaging of the lumbar spine was
performed. No intravenous contrast was administered.

[Series 2: T2 · sagittal · 4.0mm · 0.81mm/px · 8 of 17 slices shown (1 of 2)]
[im 1/17]
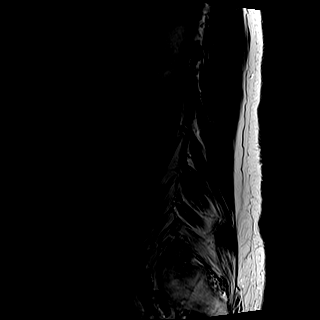
[im 3/17]
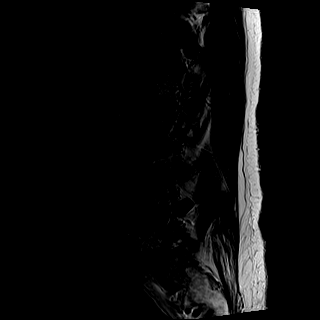
[im 5/17]
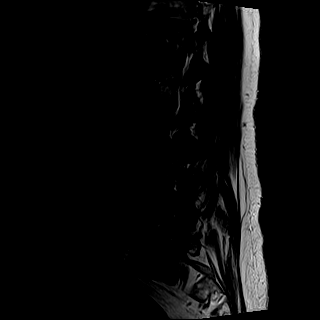
[im 7/17]
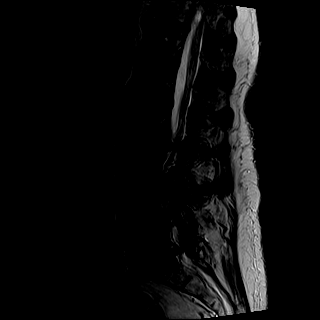
[im 10/17]
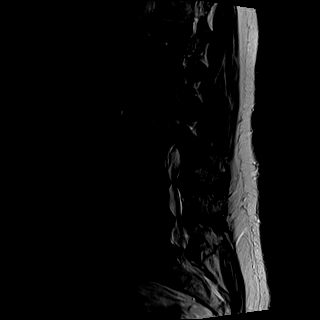
[im 12/17]
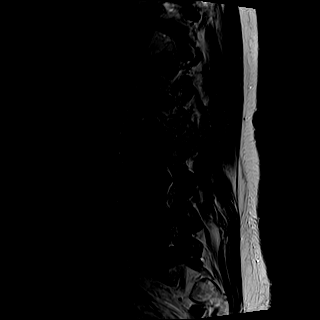
[im 14/17]
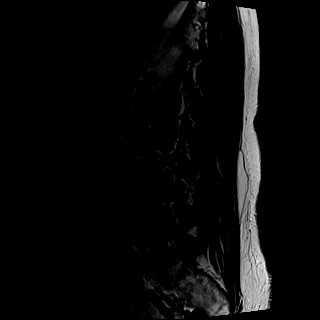
[im 17/17]
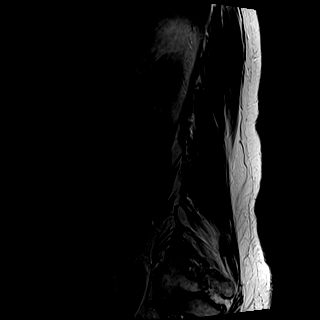

[Series 3: T1 · sagittal · 4.0mm · 0.41mm/px · 5 of 17 slices shown (1 of 2)]
[im 1/17]
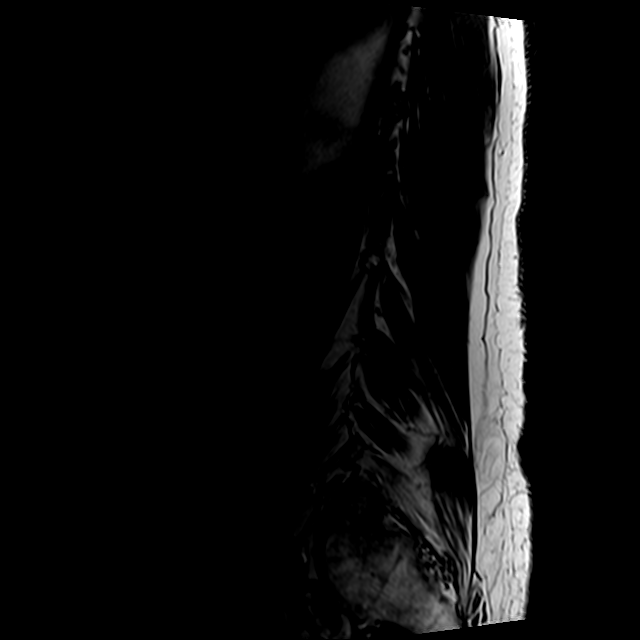
[im 3/17]
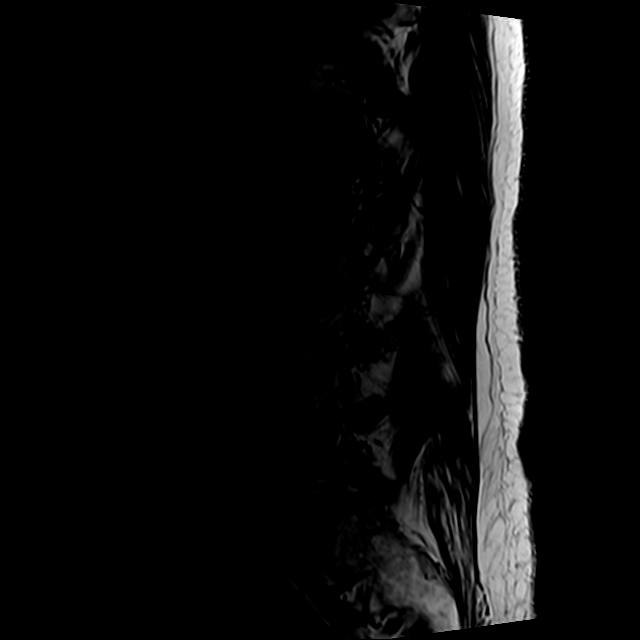
[im 6/17]
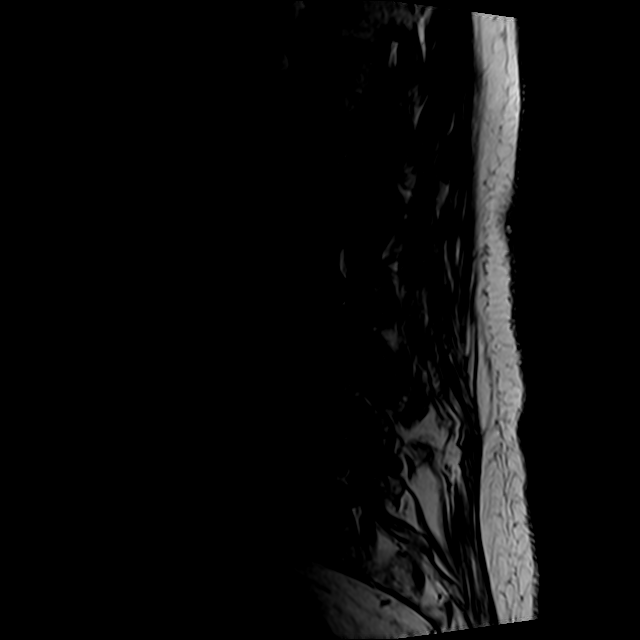
[im 9/17]
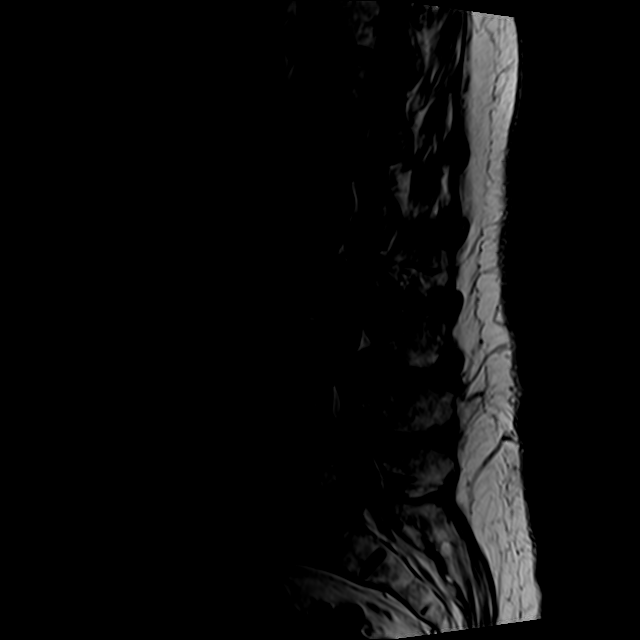
[im 14/17]
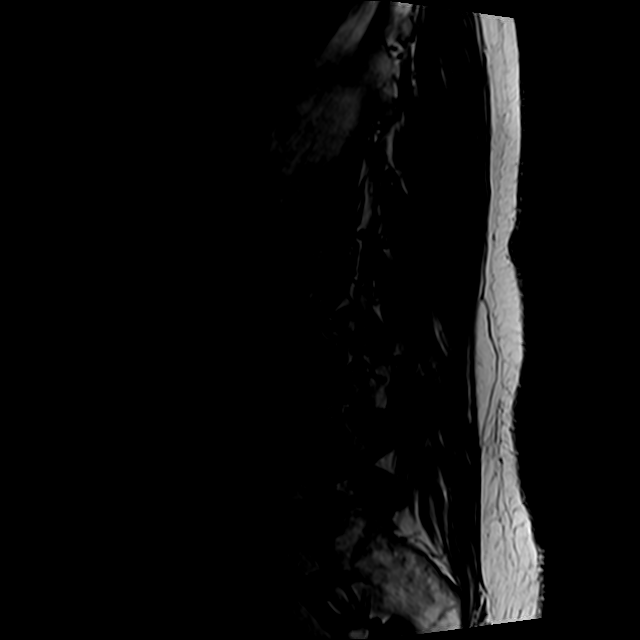

[Series 5: T2 · axial · 4.0mm · 0.78mm/px · z∈[-100,+83]mm · 9 of 32 slices shown (2 of 2)]
[im 1/32]
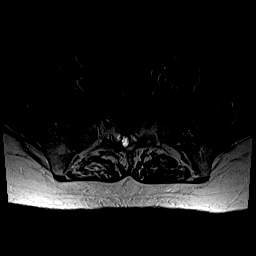
[im 6/32]
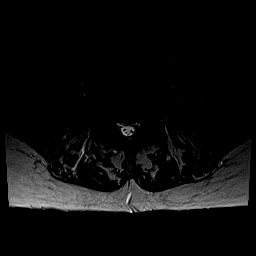
[im 11/32]
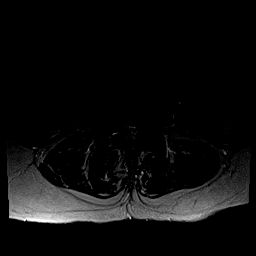
[im 13/32]
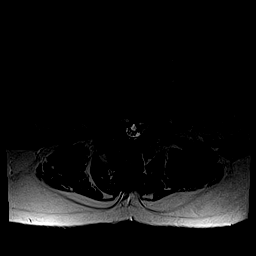
[im 16/32]
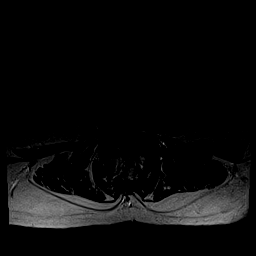
[im 19/32]
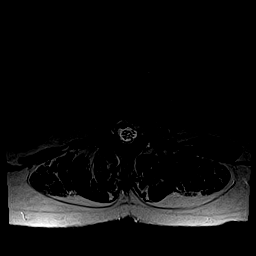
[im 21/32]
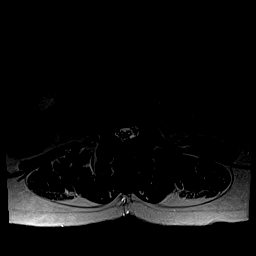
[im 26/32]
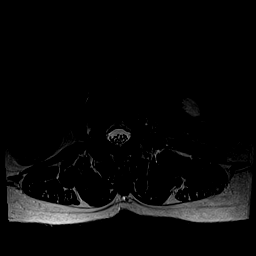
[im 32/32]
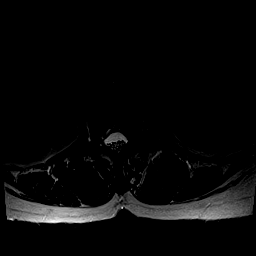

[Series 6: T1 · axial · 4.0mm · 0.31mm/px · z∈[-76,+53]mm · 3 of 32 slices shown (2 of 2)]
[im 6/32]
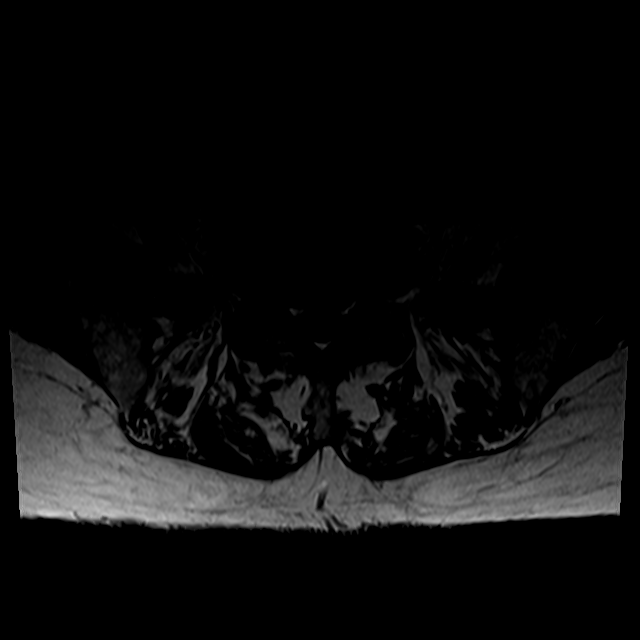
[im 16/32]
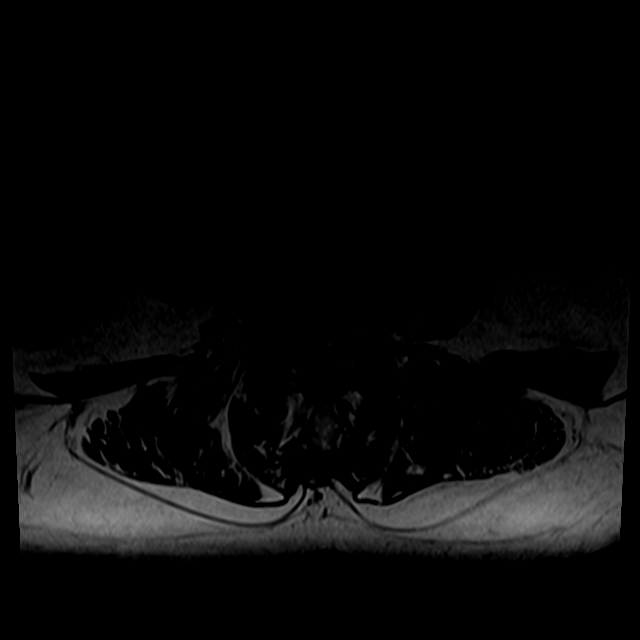
[im 26/32]
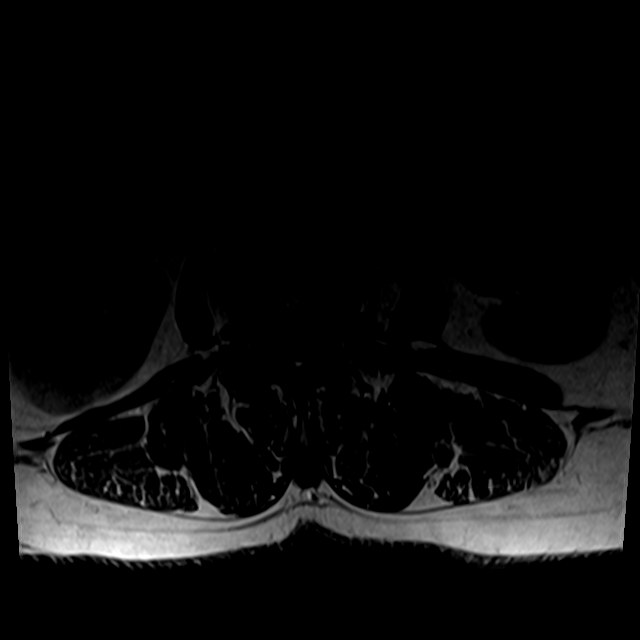

[25 of 48 positions shown; findings below may reference images not displayed]

FINDINGS: Segmentation:  Standard.

Alignment:  Physiologic.

Vertebrae: T1 and T2 hyperintense focus within the L1 vertebral body
probably represents a hemangioma. There is degenerative edema at the
inferior L2 endplate associated with a Schmorl's node in the
opposing L3-4, L4-5, and L5-S1 endplates. No evidence for discitis,
fracture, or suspicious osseous lesion.

Conus medullaris: Extends to the L1 level and appears normal.

Paraspinal and other soft tissues: Subcentimeter T2 hyperintense
foci within the left kidney with low T1 signal are probably cysts.
Atrophy of the left kidney.

Disc levels:

Multilevel disc desiccation and disc space narrowing greatest from
L3 through S1. Extensive facet hypertrophy and osteoarthrosis of the
interspinous articulations compatible with Baastrup disease.

L1-2: Small disc bulge and left-greater-than-right facet hypertrophy
with moderate left and mild right foraminal narrowing. No
significant canal stenosis.

L2-3: Moderate disc bulge and facet/ ligamentum flavum hypertrophy.
Moderate left and mild right foraminal narrowing. Moderate canal
stenosis and effacement of lateral recesses with clumping of cauda
equina.

L3-4: Large disc bulge and right greater than left facet/ligamentum
flavum hypertrophy. Trace left facet effusion. Severe right and mild
left foraminal narrowing. Severe canal stenosis with effacement of
CSF.

L4-5: Moderate disc bulge eccentric to the right and right greater
than left facet/ligamentum flavum hypertrophy. Severe right and mild
left foraminal narrowing. Moderate to severe canal stenosis.
Effacement of lateral recesses.

L5-S1: Moderate disc bulge with central protrusion and right greater
than left facet hypertrophy. Severe right and mild left foraminal
narrowing. Mild canal stenosis and effacement of the lateral
recesses bilaterally.
IMPRESSION: Extensive lumbar spondylosis with prominent facet arthropathy and
Baastrup's disease. Multilevel canal stenosis moderate at L2-3 and
L4-5, severe at L3-4. Addition, there is foraminal stone greatest at
the right L3 through S1 levels where it is severe.

By: Dangela Angol M.D.

## 2018-10-10 ENCOUNTER — Encounter: Payer: Self-pay | Admitting: Family Medicine

## 2018-10-10 ENCOUNTER — Ambulatory Visit (INDEPENDENT_AMBULATORY_CARE_PROVIDER_SITE_OTHER): Payer: Medicare Other | Admitting: Family Medicine

## 2018-10-10 ENCOUNTER — Telehealth: Payer: Self-pay | Admitting: *Deleted

## 2018-10-10 VITALS — Temp 96.6°F

## 2018-10-10 DIAGNOSIS — N3001 Acute cystitis with hematuria: Secondary | ICD-10-CM | POA: Diagnosis not present

## 2018-10-10 DIAGNOSIS — R300 Dysuria: Secondary | ICD-10-CM

## 2018-10-10 LAB — POCT URINALYSIS DIPSTICK
Bilirubin, UA: NEGATIVE
Blood, UA: POSITIVE
Glucose, UA: NEGATIVE
Ketones, UA: NEGATIVE
Nitrite, UA: POSITIVE
Protein, UA: POSITIVE — AB
Spec Grav, UA: 1.015 (ref 1.010–1.025)
Urobilinogen, UA: 0.2 E.U./dL
pH, UA: 5 (ref 5.0–8.0)

## 2018-10-10 MED ORDER — CEPHALEXIN 250 MG PO CAPS: 250.0000 mg | ORAL_CAPSULE | Freq: Four times a day (QID) | ORAL | 0 refills | Status: AC

## 2018-10-10 NOTE — Telephone Encounter (Signed)
Called patient and confirmed that she is having symptoms. Patient is to check and find out if she can get someone to bring a urine specimen over and will call back.

## 2018-10-10 NOTE — Telephone Encounter (Signed)
Needie NP with [employer] left a voicemail stating that she is out visiting patient. Needie stated that the patient complained of burning with urination for the past 3 days. Needie stated that the patient's urine is real cloudy and the dip stick that she has only shows protein and sugar and those were negative. Needie stated that patient may need to be checked for a UTI.

## 2018-10-10 NOTE — Progress Notes (Signed)
    I connected with [name] on 10/10/18 at  4:00 PM EDT by Telephone and verified that I am speaking with the correct person using two identifiers.   I discussed the limitations, risks, security and privacy concerns of performing an evaluation and management service by video and the availability of in person appointments. I also discussed with the patient that there may be a patient responsible charge related to this service. The patient expressed understanding and agreed to proceed.  Patient location: Home Provider Location: [facility] Participants: Lesleigh Noe and [name]   Subjective:     Diana [name] is a 85 y.o. female presenting for Dysuria (pain and burning with urination. Some days worse then others. )     HPI   #Dysuria - pain and burning with urination - over the last few days - can be better in the afternoon - drinks a lot of water which helps - no blood in the urine - has been several years since last UTI     Review of Systems  Constitutional: Negative for chills and fever.  Gastrointestinal: Negative for abdominal pain, nausea and vomiting.  Musculoskeletal: Positive for back pain (chronic).     Social History   Tobacco Use  Smoking Status Former Smoker  . Packs/day: 0.50  . Years: 42.00  . Pack years: 21.00  . Types: Cigarettes  Smokeless Tobacco Never Used        Objective:   BP Readings from Last 3 Encounters:  09/18/18 90/70  03/13/18 (!) 150/82  02/03/18 138/70   Wt Readings from Last 3 Encounters:  09/18/18 177 lb 8 oz (80.5 kg)  03/13/18 170 lb (77.1 kg)  02/03/18 172 lb 8 oz (78.2 kg)    Temp (!) 96.6 F (35.9 C) Comment: per patient   Exam (telephone) Alert and oriented Speaking in complete sentences NAD, breathing comfortably  Labs UA: +LE, positive       Assessment & Plan:   Problem List Items Addressed This Visit    None    Visit Diagnoses    Acute cystitis with hematuria     -  Primary   Relevant Medications   cephALEXin (KEFLEX) 250 MG capsule   Dysuria       Relevant Orders   POCT urinalysis dipstick (Completed)     Based on hx and UA most likely UTI. Previously treated with Keflex per chart so will do this again. Follow-up culture.     Interactive audio and video telecommunications were attempted between this provider and patient, however failed, due to patient having technical difficulties OR patient did not have access to video capability.  We continued and completed visit with audio only.   Start: 4:05 End: 4:13   No follow-ups on file.  Lesleigh Noe, MD

## 2018-10-10 NOTE — Addendum Note (Signed)
Addended by: Lesleigh Noe on: 10/10/2018 04:33 PM   Modules accepted: Orders

## 2018-10-11 NOTE — Telephone Encounter (Signed)
Patient had a phone visit with Dr. Einar Pheasant on 10/10/2018 and UA issue was addressed

## 2018-10-12 LAB — URINE CULTURE
MICRO NUMBER:: 598343
SPECIMEN QUALITY:: ADEQUATE

## 2018-12-19 ENCOUNTER — Ambulatory Visit: Payer: Medicare Other | Admitting: Family Medicine

## 2018-12-21 ENCOUNTER — Other Ambulatory Visit: Payer: Self-pay

## 2018-12-21 ENCOUNTER — Other Ambulatory Visit (HOSPITAL_COMMUNITY): Payer: Self-pay | Admitting: Orthopedic Surgery

## 2018-12-21 ENCOUNTER — Ambulatory Visit (HOSPITAL_COMMUNITY)
Admission: RE | Admit: 2018-12-21 | Discharge: 2018-12-21 | Disposition: A | Discharge status: Home / Self Care | Payer: Medicare Other | Source: Ambulatory Visit | Attending: Orthopedic Surgery | Admitting: Orthopedic Surgery

## 2018-12-21 DIAGNOSIS — M7989 Other specified soft tissue disorders: Secondary | ICD-10-CM | POA: Diagnosis not present

## 2018-12-21 DIAGNOSIS — M79604 Pain in right leg: Secondary | ICD-10-CM | POA: Insufficient documentation

## 2018-12-21 DIAGNOSIS — M25561 Pain in right knee: Secondary | ICD-10-CM | POA: Diagnosis not present

## 2018-12-26 ENCOUNTER — Other Ambulatory Visit: Payer: Self-pay | Admitting: Family Medicine

## 2019-01-15 ENCOUNTER — Other Ambulatory Visit: Payer: Self-pay | Admitting: Family Medicine

## 2019-01-19 ENCOUNTER — Ambulatory Visit (INDEPENDENT_AMBULATORY_CARE_PROVIDER_SITE_OTHER): Payer: Medicare Other

## 2019-01-19 DIAGNOSIS — Z23 Encounter for immunization: Secondary | ICD-10-CM | POA: Diagnosis not present

## 2019-02-05 ENCOUNTER — Other Ambulatory Visit: Payer: Self-pay | Admitting: Family Medicine

## 2019-02-15 ENCOUNTER — Encounter: Payer: Self-pay | Admitting: Family Medicine

## 2019-02-15 ENCOUNTER — Ambulatory Visit (INDEPENDENT_AMBULATORY_CARE_PROVIDER_SITE_OTHER): Payer: Medicare Other | Admitting: Family Medicine

## 2019-02-15 ENCOUNTER — Other Ambulatory Visit: Payer: Self-pay

## 2019-02-15 VITALS — BP 108/70 | HR 77 | Temp 98.0°F | Ht 61.75 in | Wt 173.8 lb

## 2019-02-15 DIAGNOSIS — E039 Hypothyroidism, unspecified: Secondary | ICD-10-CM

## 2019-02-15 DIAGNOSIS — Z0000 Encounter for general adult medical examination without abnormal findings: Secondary | ICD-10-CM | POA: Diagnosis not present

## 2019-02-15 DIAGNOSIS — E559 Vitamin D deficiency, unspecified: Secondary | ICD-10-CM

## 2019-02-15 DIAGNOSIS — D500 Iron deficiency anemia secondary to blood loss (chronic): Secondary | ICD-10-CM | POA: Diagnosis not present

## 2019-02-15 DIAGNOSIS — R600 Localized edema: Secondary | ICD-10-CM

## 2019-02-15 DIAGNOSIS — R7303 Prediabetes: Secondary | ICD-10-CM

## 2019-02-15 DIAGNOSIS — J449 Chronic obstructive pulmonary disease, unspecified: Secondary | ICD-10-CM

## 2019-02-15 DIAGNOSIS — I10 Essential (primary) hypertension: Secondary | ICD-10-CM

## 2019-02-15 MED ORDER — TRAMADOL HCL 50 MG PO TABS: 50.0000 mg | ORAL_TABLET | Freq: Three times a day (TID) | ORAL | 0 refills | Status: AC | PRN

## 2019-02-15 NOTE — Patient Instructions (Signed)
Please stop at the lab to have labs drawn.  

## 2019-02-15 NOTE — Progress Notes (Signed)
Chief Complaint  Patient presents with  . Medicare Wellness    History of Present Illness: HPI   The patient presents for annual medicare wellness, complete physical and review of chronic health problems. He/She also has the following acute concerns today: none  I have personally reviewed the Medicare Annual Wellness questionnaire and have noted 1. The patient's medical and social history 2. Their use of alcohol, tobacco or illicit drugs 3. Their current medications and supplements 4. The patient's functional ability including ADL's, fall risks, home safety risks and hearing or visual             impairment. 5. Diet and physical activities 6. Evidence for depression or mood disorders 7.         Updated provider list Cognitive evaluation was performed and recorded on pt medicare questionnaire form. The patients weight, height, BMI and visual acuity have been recorded in the chart  I have made referrals, counseling and provided education to the patient based review of the above and I have provided the pt with a written personalized care plan for preventive services.   Documentation of this information was scanned into the electronic record under the media tab.   Advance directives and end of life planning reviewed in detail with patient and documented in EMR. Patient given handout on advance care directives if needed. HCPOA and living will updated if needed.    Office Visit from 02/15/2019 in [facility]  PHQ-2 Total Score  0      Fall Risk  02/15/2019 01/03/2018 04/23/2016 01/10/2014 12/29/2012  Falls in the past year? 0 No No Yes No  Number falls in past yr: - - - 1 -  Injury with Fall? - - - Yes -  Risk for fall due to : - - - Impaired mobility Impaired mobility  Risk for fall due to: Comment - - - Uses Cane with walking Use walking cane     Hearing Screening   Method: Audiometry   125Hz  250Hz  500Hz  1000Hz  2000Hz  3000Hz  4000Hz  6000Hz  8000Hz   Right ear:    0 0 0  0    Left ear:   0 0 0  0    Vision Screening Comments: Wears Glasses-Eye Exam scheduled for 03/21/2019 with Dr. Leonette Nutting.   Chronic insomnia mainly due to leg pain.. Using 1/2 tab  Hypertension:Good control todayon metoprolol xl 50 mg daily,amlodipine 10 Mg, Cozaar 100, valsartan 160 mg daily BP Readings from Last 3 Encounters:  02/15/19 108/70  09/18/18 90/70  03/13/18 (!) 150/82     Hypothyroid  Due for re-eval. Lab Results  Component Value Date   TSH 1.70 01/03/2018     COPD: stable, no dailycough, no wheeze  OA in many joints.Clarnce Flock D.r Caffey for right knee pain...helps some with pain.  Tylenol helps minimally   COVID 19 screen No recent travel or known exposure to [place] The patient denies respiratory symptoms of COVID 19 at this time.  The importance of social distancing was discussed today.   Review of Systems  Constitutional: Negative for chills and fever.  HENT: Negative for congestion and ear pain.   Eyes: Negative for pain and redness.  Respiratory: Negative for cough and shortness of breath.   Cardiovascular: Negative for chest pain, palpitations and leg swelling.  Gastrointestinal: Negative for abdominal pain, blood in stool, constipation, diarrhea, nausea and vomiting.  Genitourinary: Negative for dysuria.  Musculoskeletal: Negative for falls and myalgias.  Skin: Negative for rash.  Neurological: Negative for dizziness.  Psychiatric/Behavioral: Negative for depression. The patient is not nervous/anxious.       Past Medical History:  Diagnosis Date  . Arthritis   . Bacterial pneumonia, unspecified   . Cancer , breast   . Candidiasis of mouth   . Chronic airway obstruction, not elsewhere classified    son not aware of this  . Encephalitis 1961  . Family history of adverse reaction to anesthesia    son has nausea  . Heart murmur    has had it for years; very mild AS 05/2015 echo  . Hyposmolality and/or hyponatremia   .  Lumbago   . Obstructive chronic bronchitis with exacerbation ([place])   . Other abnormal glucose   . Pain in joint, shoulder region   . Palpitations   . Persistent disorder of initiating or maintaining sleep   . Restless leg syndrome   . Unspecified essential hypertension   . Unspecified hypothyroidism   . Unspecified viral infection, in conditions classified elsewhere and of unspecified site   . Unspecified vitamin D deficiency   . Urinary tract infection, site not specified     reports that she has quit smoking. Her smoking use included cigarettes. She has a 21.00 pack-year smoking history. She has never used smokeless tobacco. She reports that she does not drink alcohol or use drugs.   Current Outpatient Medications:  .  acetaminophen (TYLENOL) 650 MG CR tablet, Take 650 mg by mouth daily., Disp: , Rfl:  .  amLODipine-valsartan (EXFORGE) 10-160 MG tablet, TAKE 1 TABLET BY MOUTH ONCE DAILY, Disp: 90 tablet, Rfl: 0 .  furosemide (LASIX) 40 MG tablet, TAKE 1 TABLET BY MOUTH ONCE A DAY, Disp: 90 tablet, Rfl: 0 .  levothyroxine (SYNTHROID, LEVOTHROID) 100 MCG tablet, TAKE 1 TABLET BY MOUTH ONCE A DAY, Disp: 90 tablet, Rfl: 3 .  metoprolol succinate (TOPROL-XL) 50 MG 24 hr tablet, TAKE 1 TABLET BY MOUTH ONCE DAILY WITH OR IMMEDIATELY FOLLOWING A MEAL, Disp: 90 tablet, Rfl: 0 .  traZODone (DESYREL) 50 MG tablet, TAKE 1/2 TO 1 TABLET BY MOUTH AT BEDTIMEAS NEEDED FOR SLEEP, Disp: 90 tablet, Rfl: 0   Observations/Objective: Blood pressure 108/70, pulse 77, temperature 98 F (36.7 C), temperature source Temporal, height 5' 1.75" (1.568 m), weight 173 lb 12 oz (78.8 kg), SpO2 95 %.  Physical Exam Vitals signs and nursing note reviewed.  Constitutional:      General: She is not in acute distress.    Appearance: Normal appearance. She is well-developed. She is not ill-appearing or toxic-appearing.     Comments: obese  HENT:     Head: Normocephalic and atraumatic.     Right Ear: Hearing, tympanic  membrane, ear canal and external ear normal. Tympanic membrane is not erythematous, retracted or bulging.     Left Ear: Hearing, tympanic membrane, ear canal and external ear normal. Tympanic membrane is not erythematous, retracted or bulging.     Nose: No mucosal edema or rhinorrhea.     Right Sinus: No maxillary sinus tenderness or frontal sinus tenderness.     Left Sinus: No maxillary sinus tenderness or frontal sinus tenderness.     Mouth/Throat:     Pharynx: Uvula midline. No oropharyngeal exudate.  Eyes:     General: Lids are normal. Lids are everted, no foreign bodies appreciated.     Conjunctiva/sclera: Conjunctivae normal.     Pupils: Pupils are equal, round, and reactive to light.  Neck:     Musculoskeletal: Normal range  of motion and neck supple.     Thyroid: No thyroid mass or thyromegaly.     Vascular: No carotid bruit.     Trachea: Trachea normal.  Cardiovascular:     Rate and Rhythm: Normal rate and regular rhythm.     Pulses: Normal pulses.     Heart sounds: Normal heart sounds, S1 normal and S2 normal. No murmur. No friction rub. No gallop.   Pulmonary:     Effort: Pulmonary effort is normal. No tachypnea or respiratory distress.     Breath sounds: Normal breath sounds. No decreased breath sounds, wheezing, rhonchi or rales.  Abdominal:     General: Bowel sounds are normal.     Palpations: Abdomen is soft.     Tenderness: There is no abdominal tenderness.  Musculoskeletal:     Lumbar back: She exhibits decreased range of motion.     Comments: Pitting edema R upper extremity from hand to shoulder, NO tenderness but no erythema or warmth No significant pedal edema BLE  Skin:    General: Skin is warm and dry.     Findings: No rash.  Neurological:     Mental Status: She is alert and oriented to person, place, and time.     Sensory: Sensory deficit present.     Motor: Atrophy present. No abnormal muscle tone.     Gait: Gait abnormal.     Comments: Strength normal  but very limited low body endurance  Psychiatric:        Mood and Affect: Mood is not anxious or depressed.        Speech: Speech normal.        Behavior: Behavior normal. Behavior is cooperative.        Thought Content: Thought content normal.        Judgment: Judgment normal.      Assessment and Plan The patient's preventative maintenance and recommended screening tests for an annual wellness exam were reviewed in full today. Brought up to date unless services declined.  Counselled on the importance of diet, exercise, and its role in overall health and mortality. The patient's FH and SH was reviewed, including their home life, tobacco status, and drug and alcohol status.   Vaccines: Uptodate with flu, shingles and pneumonia vaccines, td Mammo:Nml 01/2011.Marland Kitchen Hx of breast cancer 1986.Marland Kitchen She is not interested in further mammograms and breast exams given she would not do aggressive treatment. Colon: last 2003, no further indicated, no family history  DVE/pap: not indicated DXA: sister nml, no family history... Has never had. She refuses this at this time, again refused 2020 Decreased hearing, tinnitus.   Essential hypertension Well controlled. Continue current medication.   Hypothyroidism Due for re-eval.  COPD (chronic obstructive pulmonary disease) (HCC) stable, no dailycough, no wheeze     Eliezer Lofts, MD

## 2019-02-16 LAB — COMPREHENSIVE METABOLIC PANEL
ALT: 11 U/L (ref 0–35)
AST: 14 U/L (ref 0–37)
Albumin: 4.5 g/dL (ref 3.5–5.2)
Alkaline Phosphatase: 79 U/L (ref 39–117)
BUN: 18 mg/dL (ref 6–23)
CO2: 29 mEq/L (ref 19–32)
Calcium: 9.5 mg/dL (ref 8.4–10.5)
Chloride: 97 mEq/L (ref 96–112)
Creatinine, Ser: 1.16 mg/dL (ref 0.40–1.20)
GFR: 43.58 mL/min — ABNORMAL LOW (ref >60.00)
Glucose, Bld: 99 mg/dL (ref 70–99)
Potassium: 4.7 mEq/L (ref 3.5–5.1)
Sodium: 134 mEq/L — ABNORMAL LOW (ref 135–145)
Total Bilirubin: 0.3 mg/dL (ref 0.2–1.2)
Total Protein: 7.3 g/dL (ref 6.0–8.3)

## 2019-02-16 LAB — CBC WITH DIFFERENTIAL/PLATELET
Basophils Absolute: 0.1 10*3/uL (ref 0.0–0.1)
Basophils Relative: 1 % (ref 0.0–3.0)
Eosinophils Absolute: 0.1 10*3/uL (ref 0.0–0.7)
Eosinophils Relative: 1 % (ref 0.0–5.0)
HCT: 39.4 % (ref 36.0–46.0)
Hemoglobin: 13.2 g/dL (ref 12.0–15.0)
Lymphocytes Relative: 26.7 % (ref 12.0–46.0)
Lymphs Abs: 2.1 10*3/uL (ref 0.7–4.0)
MCHC: 33.4 g/dL (ref 30.0–36.0)
MCV: 97.2 fl (ref 78.0–100.0)
Monocytes Absolute: 0.7 10*3/uL (ref 0.1–1.0)
Monocytes Relative: 8.9 % (ref 3.0–12.0)
Neutro Abs: 4.9 10*3/uL (ref 1.4–7.7)
Neutrophils Relative %: 62.4 % (ref 43.0–77.0)
Platelets: 191 10*3/uL (ref 150.0–400.0)
RBC: 4.05 Mil/uL (ref 3.87–5.11)
RDW: 13.2 % (ref 11.5–15.5)
WBC: 7.8 10*3/uL (ref 4.0–10.5)

## 2019-02-16 LAB — T4, FREE: Free T4: 1.06 ng/dL (ref 0.60–1.60)

## 2019-02-16 LAB — TSH: TSH: 1.27 u[IU]/mL (ref 0.35–4.50)

## 2019-02-16 LAB — T3, FREE: T3, Free: 2.7 pg/mL (ref 2.3–4.2)

## 2019-02-16 LAB — HEMOGLOBIN A1C: Hgb A1c MFr Bld: 6 % (ref 4.6–6.5)

## 2019-02-16 LAB — VITAMIN D 25 HYDROXY (VIT D DEFICIENCY, FRACTURES): VITD: 32.91 ng/mL (ref 30.00–100.00)

## 2019-03-01 ENCOUNTER — Other Ambulatory Visit: Payer: Self-pay | Admitting: Family Medicine

## 2019-03-12 NOTE — Assessment & Plan Note (Signed)
Well controlled. Continue current medication.  

## 2019-03-12 NOTE — Assessment & Plan Note (Signed)
stable, no dailycough, no wheeze

## 2019-03-12 NOTE — Assessment & Plan Note (Signed)
Due for re-eval. 

## 2019-03-21 DIAGNOSIS — H26493 Other secondary cataract, bilateral: Secondary | ICD-10-CM | POA: Diagnosis not present

## 2019-03-21 DIAGNOSIS — H52202 Unspecified astigmatism, left eye: Secondary | ICD-10-CM | POA: Diagnosis not present

## 2019-03-21 DIAGNOSIS — H04123 Dry eye syndrome of bilateral lacrimal glands: Secondary | ICD-10-CM | POA: Diagnosis not present

## 2019-03-21 DIAGNOSIS — H43813 Vitreous degeneration, bilateral: Secondary | ICD-10-CM | POA: Diagnosis not present

## 2019-03-28 ENCOUNTER — Other Ambulatory Visit: Payer: Self-pay | Admitting: Family Medicine

## 2019-04-17 ENCOUNTER — Other Ambulatory Visit: Payer: Self-pay | Admitting: Family Medicine

## 2019-05-01 ENCOUNTER — Other Ambulatory Visit: Payer: Self-pay | Admitting: Neurological Surgery

## 2019-05-01 DIAGNOSIS — M4712 Other spondylosis with myelopathy, cervical region: Secondary | ICD-10-CM | POA: Diagnosis not present

## 2019-05-07 ENCOUNTER — Other Ambulatory Visit: Payer: Self-pay | Admitting: Family Medicine

## 2019-05-11 ENCOUNTER — Ambulatory Visit: Payer: Medicare Other

## 2019-05-14 ENCOUNTER — Other Ambulatory Visit: Payer: Self-pay | Admitting: Neurological Surgery

## 2019-05-14 DIAGNOSIS — M4712 Other spondylosis with myelopathy, cervical region: Secondary | ICD-10-CM

## 2019-05-17 ENCOUNTER — Other Ambulatory Visit: Payer: Self-pay | Admitting: Family Medicine

## 2019-05-17 ENCOUNTER — Ambulatory Visit: Payer: Medicare Other

## 2019-06-05 ENCOUNTER — Ambulatory Visit
Admission: RE | Admit: 2019-06-05 | Discharge: 2019-06-05 | Disposition: A | Discharge status: Home / Self Care | Payer: Medicare Other | Source: Ambulatory Visit | Attending: Neurological Surgery | Admitting: Neurological Surgery

## 2019-06-05 ENCOUNTER — Other Ambulatory Visit: Payer: Self-pay

## 2019-06-05 DIAGNOSIS — M4712 Other spondylosis with myelopathy, cervical region: Secondary | ICD-10-CM

## 2019-06-05 DIAGNOSIS — M4802 Spinal stenosis, cervical region: Secondary | ICD-10-CM | POA: Diagnosis not present

## 2019-06-11 ENCOUNTER — Ambulatory Visit: Payer: Medicare Other

## 2019-06-18 ENCOUNTER — Telehealth: Payer: Self-pay | Admitting: Family Medicine

## 2019-06-18 NOTE — Chronic Care Management (AMB) (Signed)
  Chronic Care Management   Note  06/18/2019 Name: [name] MRN: [id number] DOB: 1923-07-25  Diana [name] is a 86 y.o. year old female who is a primary care patient of Bedsole, Amy E, MD. I reached out to Diana [name] by phone today in response to a referral sent by Diana [name]'s PCP, Jinny Sanders, MD.   Diana [name] was given information about Chronic Care Management services today including:  1. CCM service includes personalized support from designated clinical staff supervised by her physician, including individualized plan of care and coordination with other care providers 2. 24/7 contact phone numbers for assistance for urgent and routine care needs. 3. Service will only be billed when office clinical staff spend 20 minutes or more in a month to coordinate care. 4. Only one practitioner may furnish and bill the service in a calendar month. 5. The patient may stop CCM services at any time (effective at the end of the month) by phone call to the office staff. 6. The patient will be responsible for cost sharing (co-pay) of up to 20% of the service fee (after annual deductible is met).  Patient agreed to services and verbal consent obtained.   Follow up plan:   Diana [name] [employer] Scheduler

## 2019-07-04 ENCOUNTER — Ambulatory Visit: Payer: Medicare Other

## 2019-07-04 ENCOUNTER — Other Ambulatory Visit: Payer: Self-pay

## 2019-07-04 ENCOUNTER — Telehealth: Payer: Self-pay

## 2019-07-04 DIAGNOSIS — J449 Chronic obstructive pulmonary disease, unspecified: Secondary | ICD-10-CM

## 2019-07-04 DIAGNOSIS — R7303 Prediabetes: Secondary | ICD-10-CM

## 2019-07-04 DIAGNOSIS — I10 Essential (primary) hypertension: Secondary | ICD-10-CM

## 2019-07-04 DIAGNOSIS — G4700 Insomnia, unspecified: Secondary | ICD-10-CM

## 2019-07-04 DIAGNOSIS — E039 Hypothyroidism, unspecified: Secondary | ICD-10-CM

## 2019-07-04 DIAGNOSIS — G8929 Other chronic pain: Secondary | ICD-10-CM

## 2019-07-04 DIAGNOSIS — M5442 Lumbago with sciatica, left side: Secondary | ICD-10-CM

## 2019-07-04 DIAGNOSIS — E559 Vitamin D deficiency, unspecified: Secondary | ICD-10-CM

## 2019-07-04 NOTE — Chronic Care Management (AMB) (Signed)
Chronic Care Management [name]  Name: Diana [name]  MRN: [id number] DOB: [birth year]  Chief Complaint/ HPI  Diana [name],  86 y.o., female presents for their Initial CCM visit with the clinical pharmacist via telephone. Appointment was completed with Diana [name], patient's sister (RN) along with patient. Sister's phone number for follow up contact: [phone]  PCP : Diana Sanders, MD  Their chronic conditions include: hypertension, COPD, hypothyroidism, vitamin D deficiency, insomnia, back pain, prediabetes  Patient concerns: denies medication concerns  Office Visits:  02/15/19: [facility] - continue current medications  Consult Visit:  09/18/18: Pain, Diana [name] - given steroid injection, recommend athletic compression socks for swelling   Allergies  Allergen Reactions  . Meperidine Hcl Nausea And Vomiting   Medications: Outpatient Encounter Medications as of 07/04/2019  Medication Sig  . acetaminophen (TYLENOL) 650 MG CR tablet Take 650 mg by mouth daily.  Marland Kitchen amLODipine-valsartan (EXFORGE) 10-160 MG tablet TAKE 1 TABLET BY MOUTH ONCE DAILY  . furosemide (LASIX) 40 MG tablet TAKE 1 TABLET BY MOUTH ONCE DAILY  . levothyroxine (SYNTHROID) 100 MCG tablet TAKE 1 TABLET BY MOUTH ONCE A DAY  . metoprolol succinate (TOPROL-XL) 50 MG 24 hr tablet TAKE 1 TABLET BY MOUTH ONCE DAILY WITH OR IMMEDIATELY FOLLOWING A MEAL  . traZODone (DESYREL) 50 MG tablet TAKE 1/2 TO 1 TABLET BY MOUTH AT [facility] NEEDED FOR SLEEP   No facility-administered encounter medications on file as of 07/04/2019.   Current Diagnosis/Assessment:  Lipids (no diagnosis of hyperlipidemia)   Lipid Panel     Component Value Date/Time   CHOL 197 04/23/2016 1716   TRIG 138 04/23/2016 1716   HDL 46 (L) 04/23/2016 1716   CHOLHDL 4.3 04/23/2016 1716   VLDL 28 04/23/2016 1716   LDLCALC 123 (H) 04/23/2016 1716    The ASCVD Risk score (Goff DC Jr., et al., 2013) failed to calculate for the following reasons:    The 2013 ASCVD risk score is only valid for ages 63 to 55   Patient has failed these meds in past: none  Patient is currently uncontrolled on the following medications:   No pharmacotherapy  Plan: Continue control with diet and exercise  Pre-Diabetes   Recent Relevant Labs: Lab Results  Component Value Date/Time   HGBA1C 6.0 02/15/2019 03:29 PM   HGBA1C 6.2 01/03/2018 03:04 PM   Patient is currently controlled on the following medications:   No pharmacotherapy  Plan: Continue control with diet and exercise   Hypertension   Office blood pressures are:   BP Readings from Last 3 Encounters:  02/15/19 108/70  09/18/18 90/70  03/13/18 (!) 150/82   Patient has failed these meds in the past: none  Patient checks BP at home: about once a month  Patient home BP readings are ranging: recently 108/58 mmHg, usually runs around 100 over high 50s, low 60s  Patient is currently controlled on the following medications:   Amlodipine-valsartan 10-360 mg - 1 tablet daily  Furosemide 40 mg - 1 tablet daily   Metoprolol succinate 50 mg - 1 tablet daily with meal  We discussed: potentially reduce blood pressure medications if needed; sister will begin checking blood pressure more frequently and let us know if any hypotension   Plan: Continue current medications; Monitor for hypotension.    COPD / Tobacco   Last spirometry score: none per chart  Eosinophil count:   Lab Results  Component Value Date/Time   EOSPCT 1.0 02/15/2019 03:29 PM  %  Eos (Absolute):  Lab Results  Component Value Date/Time   EOSABS 0.1 02/15/2019 03:29 PM   Tobacco Status: quit > 40 years ago  Social History   Tobacco Use  Smoking Status Former Smoker  . Packs/day: 0.50  . Years: 42.00  . Pack years: 21.00  . Types: Cigarettes  Smokeless Tobacco Never Used   Patient has failed these meds in past: none Patient is currently controlled on the following medications:    No pharmacotherapy  We discussed: denies shortness of breath   Plan: Continue current medications   Hypothyroidism   TSH 02/15/19 1.27 Patient has failed these meds in past:  Patient is currently controlled on the following medications:   Levothyroxine 100 mg - 1 tablet daily  Plan: Continue current medications   Osteoarthritis   Followed by orthopedics (Dr. Ronnald [name]) Multiple joints, frequently in pain, shoulders, pain, back, fingers   Patient has failed these meds in past: tramadol - dizziness, Voltaren gel, lidocaine patches Patient is currently uncontrolled on the following medications:   Tylenol 325 mg - 1-2 tablets every 4-6 hours as needed  We discussed: wears ankle and knee brace, takes Tylenol sparingly but denies much efficacy  Plan: Continue current medication; Follow up with specialist.   Insomnia   Patient has failed these meds in past: none  Patient is currently controlled on the following medications:   Trazodone 50 mg - 1/2 qhs PRN  We discussed: mainly due to leg pain per PCP visit, only taking 1/2 tablet every night   Plan: Continue current medications  Medication Management  OTCs: Tylenol, Senna - PRN   [name]/Benefits: [employer]/Diana [name]  Vaccines: up to date  CCM Follow Up: 02/11/20 at 1:30 PM (telephone)  Diana [name], PharmD Clinical Pharmacist [facility] [phone]

## 2019-07-04 NOTE — Telephone Encounter (Signed)
I would like to request a referral for Diana [name] to chronic care management pharmacy services for the following conditions:   Essential hypertension, benign  [I10]  COPD [J44.9]  Debbora Dus, PharmD Clinical Pharmacist [facility] [phone]

## 2019-07-04 NOTE — Telephone Encounter (Signed)
Please sign referral

## 2019-07-04 NOTE — Patient Instructions (Addendum)
July 04, 2019  Dear [name],  It was a pleasure meeting you during our initial appointment on July 04, 2019. Below is a summary of the goals we discussed and components of chronic care management. Please contact me anytime with questions or concerns.   Visit Information  Goals Addressed            This Visit's Progress   . Pharmacy Care Plan       CARE PLAN ENTRY  Current Barriers:  . Chronic Disease Management support, education, and care coordination needs related to hypertension, COPD, hypothyroidism, vitamin D deficiency, insomnia, back pain, prediabetes  Pharmacist Clinical Goal(s):  . Prevent blood pressure less than 90/60 mmHg on current medications. Begin monitoring blood pressure 1-2 times a week before morning medications. Repeat check within 2-3 minutes if elevated. Call pharmacist if any abnormal readings.  Interventions: . Comprehensive medication review performed.  Patient Self Care Activities:  . Self administers medications as prescribed  Initial goal documentation       Ms. Papillon was given information about Chronic Care Management services today including:  1. CCM service includes personalized support from designated clinical staff supervised by her physician, including individualized plan of care and coordination with other care providers 2. 24/7 contact phone numbers for assistance for urgent and routine care needs. 3. Standard insurance, coinsurance, copays and deductibles apply for chronic care management only during months in which we provide at least 20 minutes of these services. Most insurances cover these services at 100%, however patients may be responsible for any copay, coinsurance and/or deductible if applicable. This service may help you avoid the need for more expensive face-to-face services. 4. Only one practitioner may furnish and bill the service in a calendar month. 5. The patient may stop CCM services at any time (effective at the  end of the month) by phone call to the office staff.  Patient agreed to services and verbal consent obtained.   The patient verbalized understanding of instructions provided today and agreed to receive a mailed copy of patient instruction and/or educational materials.   Telephone follow up appointment with pharmacy team member scheduled for: 02/11/20 at 1:30 PM (with Debbie)  Debbora Dus, PharmD Clinical Pharmacist [facility] [phone]   Osteoarthritis  Osteoarthritis is a type of arthritis that affects tissue that covers the ends of bones in joints (cartilage). Cartilage acts as a cushion between the bones and helps them move smoothly. Osteoarthritis results when cartilage in the joints gets worn down. Osteoarthritis is sometimes called "wear and tear" arthritis. Osteoarthritis is the most common form of arthritis. It often occurs in older people. It is a condition that gets worse over time (a progressive condition). Joints that are most often affected by this condition are in:  Fingers.  Toes.  Hips.  Knees.  Spine, including neck and lower back. What are the causes? This condition is caused by age-related wearing down of cartilage that covers the ends of bones. What increases the risk? The following factors may make you more likely to develop this condition:  Older age.  Being overweight or obese.  Overuse of joints, such as in athletes.  Past injury of a joint.  Past surgery on a joint.  Family history of osteoarthritis. What are the signs or symptoms? The main symptoms of this condition are pain, swelling, and stiffness in the joint. The joint may lose its shape over time. Small pieces of bone or cartilage may break off  and float inside of the joint, which may cause more pain and damage to the joint. Small deposits of bone (osteophytes) may grow on the edges of the joint. Other symptoms may include:  A grating or scraping feeling  inside the joint when you move it.  Popping or creaking sounds when you move. Symptoms may affect one or more joints. Osteoarthritis in a major joint, such as your knee or hip, can make it painful to walk or exercise. If you have osteoarthritis in your hands, you might not be able to grip items, twist your hand, or control small movements of your hands and fingers (fine motor skills). How is this diagnosed? This condition may be diagnosed based on:  Your medical history.  A physical exam.  Your symptoms.  X-rays of the affected joint(s).  Blood tests to rule out other types of arthritis. How is this treated? There is no cure for this condition, but treatment can help to control pain and improve joint function. Treatment plans may include:  A prescribed exercise program that allows for rest and joint relief. You may work with a physical therapist.  A weight control plan.  Pain relief techniques, such as: ? Applying heat and cold to the joint. ? Electric pulses delivered to nerve endings under the skin (transcutaneous electrical nerve stimulation, or TENS). ? Massage. ? Certain nutritional supplements.  NSAIDs or prescription medicines to help relieve pain.  Medicine to help relieve pain and inflammation (corticosteroids). This can be given by mouth (orally) or as an injection.  Assistive devices, such as a brace, wrap, splint, specialized glove, or cane.  Surgery, such as: ? An osteotomy. This is done to reposition the bones and relieve pain or to remove loose pieces of bone and cartilage. ? Joint replacement surgery. You may need this surgery if you have very bad (advanced) osteoarthritis. Follow these instructions at home: Activity  Rest your affected joints as directed by your health care provider.  Do not drive or use heavy machinery while taking prescription pain medicine.  Exercise as directed. Your health care provider or physical therapist may recommend specific  types of exercise, such as: ? Strengthening exercises. These are done to strengthen the muscles that support joints that are affected by arthritis. They can be performed with weights or with exercise bands to add resistance. ? Aerobic activities. These are exercises, such as brisk walking or water aerobics, that get your heart pumping. ? Range-of-motion activities. These keep your joints easy to move. ? Balance and agility exercises. Managing pain, stiffness, and swelling      If directed, apply heat to the affected area as often as told by your health care provider. Use the heat source that your health care provider recommends, such as a moist heat pack or a heating pad. ? If you have a removable assistive device, remove it as told by your health care provider. ? Place a towel between your skin and the heat source. If your health care provider tells you to keep the assistive device on while you apply heat, place a towel between the assistive device and the heat source. ? Leave the heat on for 20-30 minutes. ? Remove the heat if your skin turns bright red. This is especially important if you are unable to feel pain, heat, or cold. You may have a greater risk of getting burned.  If directed, put ice on the affected joint: ? If you have a removable assistive device, remove it as told by  your health care provider. ? Put ice in a plastic bag. ? Place a towel between your skin and the bag. If your health care provider tells you to keep the assistive device on during icing, place a towel between the assistive device and the bag. ? Leave the ice on for 20 minutes, 2-3 times a day. General instructions  Take over-the-counter and prescription medicines only as told by your health care provider.  Maintain a healthy weight. Follow instructions from your health care provider for weight control. These may include dietary restrictions.  Do not use any products that contain nicotine or tobacco, such as  cigarettes and e-cigarettes. These can delay bone healing. If you need help quitting, ask your health care provider.  Use assistive devices as directed by your health care provider.  Keep all follow-up visits as told by your health care provider. This is important. Where to find more information  [employer] of Arthritis and Musculoskeletal and Skin Diseases: www.niams.SouthExposed.es  [employer] on Aging: http://kim-miller.com/  American College of Rheumatology: www.rheumatology.org Contact a health care provider if:  Your skin turns red.  You develop a rash.  You have pain that gets worse.  You have a fever along with joint or muscle aches. Get help right away if:  You lose a lot of weight.  You suddenly lose your appetite.  You have night sweats. Summary  Osteoarthritis is a type of arthritis that affects tissue covering the ends of bones in joints (cartilage).  This condition is caused by age-related wearing down of cartilage that covers the ends of bones.  The main symptom of this condition is pain, swelling, and stiffness in the joint.  There is no cure for this condition, but treatment can help to control pain and improve joint function. This information is not intended to replace advice given to you by your health care provider. Make sure you discuss any questions you have with your health care provider. Document Revised: 03/18/2017 Document Reviewed: 12/08/2015 Elsevier Patient Education  2020 [employer].

## 2019-07-31 DIAGNOSIS — M542 Cervicalgia: Secondary | ICD-10-CM | POA: Diagnosis not present

## 2019-08-06 DIAGNOSIS — M25561 Pain in right knee: Secondary | ICD-10-CM | POA: Diagnosis not present

## 2019-10-09 ENCOUNTER — Other Ambulatory Visit: Payer: Self-pay | Admitting: Family Medicine

## 2019-10-15 ENCOUNTER — Ambulatory Visit: Payer: Medicare Other | Admitting: Dermatology

## 2019-10-16 ENCOUNTER — Other Ambulatory Visit: Payer: Self-pay

## 2019-10-16 ENCOUNTER — Ambulatory Visit: Payer: Medicare Other | Admitting: Dermatology

## 2019-10-16 DIAGNOSIS — L821 Other seborrheic keratosis: Secondary | ICD-10-CM

## 2019-10-16 DIAGNOSIS — D229 Melanocytic nevi, unspecified: Secondary | ICD-10-CM | POA: Diagnosis not present

## 2019-10-16 DIAGNOSIS — D485 Neoplasm of uncertain behavior of skin: Secondary | ICD-10-CM

## 2019-10-16 DIAGNOSIS — L720 Epidermal cyst: Secondary | ICD-10-CM | POA: Diagnosis not present

## 2019-10-16 NOTE — Progress Notes (Signed)
   Follow-Up Visit   Subjective  Diana [name] is a 86 y.o. female who presents for the following: growth (right cheek, present over 1 year, growing and sore).   The following portions of the chart were reviewed this encounter and updated as appropriate:      Review of Systems:  No other skin or systemic complaints except as noted in HPI or Assessment and Plan.  Objective  Well appearing patient in no apparent distress; mood and affect are within normal limits.  A focused examination was performed including face. Relevant physical exam findings are noted in the Assessment and Plan.  Objective  Right Mid Cheek: 7.66mm firm white nodule      Assessment & Plan   Melanocytic Nevi - Tan-brown and/or pink-flesh-colored symmetric macules and papules face - Benign appearing on exam today - Observation - Call clinic for new or changing moles - Recommend daily use of broad spectrum spf 30+ sunscreen to sun-exposed areas.   Seborrheic Keratoses - Stuck-on, waxy, tan-brown papules face  - Discussed benign etiology and prognosis. - Observe - Call for any changes   Neoplasm of uncertain behavior of skin Right Mid Cheek  Epidermal / dermal shaving  Lesion diameter (cm):  0.7 Informed consent: discussed and consent obtained   Patient was prepped and draped in usual sterile fashion: Area prepped with alcohol. Anesthesia: the lesion was anesthetized in a standard fashion   Anesthetic:  1% lidocaine w/ epinephrine 1-100,000 buffered w/ 8.4% NaHCO3 Instrument used: flexible razor blade   Hemostasis achieved with: pressure, aluminum chloride and electrodesiccation   Outcome: patient tolerated procedure well   Post-procedure details: wound care instructions given   Post-procedure details comment:  Ointment and small bandage applied Additional details:  Post tx defect 10.0 x 8.90mm  Specimen 1 - Surgical pathology Differential Diagnosis: Cyst r/o Cystic SCC/BCC Check Margins:  No 7.15mm firm white nodule * 2 pieces  Return if symptoms worsen or fail to improve.  IJamesetta Orleans, CMA, am acting as scribe for Brendolyn Patty, MD .  Documentation: I have reviewed the above documentation for accuracy and completeness, and I agree with the above.  Brendolyn Patty MD

## 2019-10-16 NOTE — Patient Instructions (Signed)

## 2019-10-18 ENCOUNTER — Telehealth: Payer: Self-pay

## 2019-10-18 NOTE — Telephone Encounter (Signed)
-----   Message from Ralene Bathe, MD sent at 10/18/2019  8:53 AM EDT ----- Skin , right mid cheek EPIDERMAL INCLUSION CYST  Benign cyst

## 2019-10-18 NOTE — Telephone Encounter (Signed)
Advised patient's son, [name], of bx results/sh

## 2019-11-22 ENCOUNTER — Other Ambulatory Visit: Payer: Self-pay | Admitting: Family Medicine

## 2019-12-26 ENCOUNTER — Other Ambulatory Visit: Payer: Self-pay | Admitting: Family Medicine

## 2020-01-01 ENCOUNTER — Other Ambulatory Visit: Payer: Self-pay | Admitting: Family Medicine

## 2020-02-11 ENCOUNTER — Other Ambulatory Visit: Payer: Self-pay

## 2020-02-11 ENCOUNTER — Ambulatory Visit: Payer: Medicare Other

## 2020-02-11 DIAGNOSIS — I10 Essential (primary) hypertension: Secondary | ICD-10-CM

## 2020-02-11 DIAGNOSIS — E782 Mixed hyperlipidemia: Secondary | ICD-10-CM

## 2020-02-11 NOTE — Chronic Care Management (AMB) (Signed)
Chronic Care Management Pharmacy  Name: Diana [name]  MRN: [id number] DOB: 16-Jan-1924  Chief Complaint/ HPI  Diana [name],  86 y.o., female presents for their Follow-Up CCM visit with the clinical pharmacist via telephone. Patient's son states she is hard of hearing and requested call on home phone.   PCP : Jinny Sanders, MD  Their chronic conditions include: hypertension, COPD, hypothyroidism, vitamin D deficiency, insomnia, back pain, prediabetes  Patient concerns: denies medication concerns  Office Visits:  Physical scheduled with Dr. Diona Browner 03/04/2020. Last visit was 02/15/2019.   Consult Visit:  10/16/2019 - Dermatology - concerning area removed and sent for pathology. Results - benign cyst.   Allergies  Allergen Reactions  . Meperidine Hcl Nausea And Vomiting   Medications: Outpatient Encounter Medications as of 02/11/2020  Medication Sig  . acetaminophen (TYLENOL) 650 MG CR tablet Take 650 mg by mouth daily.  Marland Kitchen amLODipine-valsartan (EXFORGE) 10-160 MG tablet TAKE 1 TABLET BY MOUTH ONCE DAILY  . furosemide (LASIX) 40 MG tablet TAKE 1 TABLET BY MOUTH ONCE DAILY  . levothyroxine (SYNTHROID) 100 MCG tablet TAKE 1 TABLET BY MOUTH ONCE A DAY  . metoprolol succinate (TOPROL-XL) 50 MG 24 hr tablet TAKE 1 TABLET BY MOUTH ONCE DAILY WITH OR IMMEDIATELY FOLLOWING A MEAL  . senna (SENOKOT) 8.6 MG TABS tablet Take 1 tablet by mouth daily as needed for mild constipation.  . traZODone (DESYREL) 50 MG tablet TAKE 1/2 TO 1 TABLET BY MOUTH AT [facility] NEEDED FOR SLEEP   No facility-administered encounter medications on file as of 02/11/2020.   Allergies  Allergen Reactions  . Meperidine Hcl Nausea And Vomiting    SDOH Screenings   Alcohol Screen:   . Last Alcohol Screening Score (AUDIT): Not on file  Depression (PHQ2-9): Low Risk   . PHQ-2 Score: 0  Financial Resource Strain:   . Difficulty of Paying Living Expenses: Not on file  Food Insecurity:   . Worried  About Charity fundraiser in the Last Year: Not on file  . Ran Out of Food in the Last Year: Not on file  Housing:   . Last Housing Risk Score: Not on file  Physical Activity:   . Days of Exercise per Week: Not on file  . Minutes of Exercise per Session: Not on file  Social Connections:   . Frequency of Communication with Friends and Family: Not on file  . Frequency of Social Gatherings with Friends and Family: Not on file  . Attends Religious Services: Not on file  . Active Member of Clubs or Organizations: Not on file  . Attends Archivist Meetings: Not on file  . Marital Status: Not on file  Stress:   . Feeling of Stress : Not on file  Tobacco Use: Medium Risk  . Smoking Tobacco Use: Former Smoker  . Smokeless Tobacco Use: Never Used  Transportation Needs:   . Film/video editor (Medical): Not on file  . Lack of Transportation (Non-Medical): Not on file   Goals Addressed            This Visit's Progress   . Pharmacy Care Plan       CARE PLAN ENTRY (see longitudinal plan of care for additional care plan information)  Current Barriers:  . Chronic Disease Management support, education, and care coordination needs related to Hypertension, Hyperlipidemia, and Diabetes   Hypertension BP Readings from Last 3 Encounters:  02/15/19 108/70  09/18/18 90/70  03/13/18 (!) 150/82   .  Pharmacist Clinical Goal(s): o Over the next 90 days, patient will work with PharmD and providers to maintain BP goal <140/90 . Current regimen:   Amlodipine-valsartan 10-160 mg - 1 tablet daily  Furosemide 40 mg - 1 tablet daily   Metoprolol succinate 50 mg - 1 tablet daily with meal . Interventions: o Discussed low sodium in the past. Patient continues to drink gatorade to maintain sodium.  . Patient self care activities - Over the next 90 days, patient will: o Check BP 3-4 times before physical with Dr. Diona Browner in November, document, and provide at future appointments o Ensure  daily salt intake < 2300 mg/day  Hyperlipidemia Lab Results  Component Value Date/Time   LDLCALC 123 (H) 04/23/2016 05:16 PM   . Pharmacist Clinical Goal(s): o Over the next 90 days, patient will work with PharmD and providers to achieve LDL goal < 100  . Current regimen:  o Diet and lifestyle . Interventions: o Patient avoid fried foods and mainly eats chicken.  . Patient self care activities - Over the next 90 days, patient will: o Continue to avoid fried/fatty foods.   Prediabetes Lab Results  Component Value Date/Time   HGBA1C 6.0 02/15/2019 03:29 PM   HGBA1C 6.2 01/03/2018 03:04 PM   . Pharmacist Clinical Goal(s): o Over the next 90 days, patient will work with PharmD and providers to maintain A1c goal <6.5% . Current regimen:  o Diet and lifestyle . Interventions: o Patient discussed that she mainly drinks water and avoids soft drinks.  . Patient self care activities - Over the next 90 days, patient will: o Continue to manage blood sugar in diet.   Medication management . Pharmacist Clinical Goal(s): o Over the next 90 days, patient will work with PharmD and providers to maintain optimal medication adherence . Current pharmacy: [place]  . Interventions o Comprehensive medication review performed. o Continue current medication management strategy . Patient self care activities - Over the next 90 days, patient will: o Focus on medication adherence by taking medication each morning out of bottles o Take medications as prescribed o Report any questions or concerns to PharmD and/or provider(s)  Please see past updates related to this goal by clicking on the "Past Updates" button in the selected goal         Current Diagnosis/Assessment:  Lipids (no diagnosis of hyperlipidemia)   Lipid Panel     Component Value Date/Time   CHOL 197 04/23/2016 1716   TRIG 138 04/23/2016 1716   HDL 46 (L) 04/23/2016 1716   CHOLHDL 4.3 04/23/2016 1716   VLDL 28  04/23/2016 1716   LDLCALC 123 (H) 04/23/2016 1716    The ASCVD Risk score (Goff DC Jr., et al., 2013) failed to calculate for the following reasons:   The 2013 ASCVD risk score is only valid for ages 52 to 74   Patient has failed these meds in past: none  Patient is currently uncontrolled on the following medications:   No pharmacotherapy  We discussed: Patient reports that she enjoys sweets like cakes and pies. States she has a good appetite still. She mainly eats chicken and avoids fried/fatty foods. She reports enjoying vegetables and eating often.   Plan: Continue control with diet and exercise  Pre-Diabetes   Recent Relevant Labs: Lab Results  Component Value Date/Time   HGBA1C 6.0 02/15/2019 03:29 PM   HGBA1C 6.2 01/03/2018 03:04 PM   Patient is currently controlled on the following medications:   No pharmacotherapy  Plan:  Continue control with diet and exercise   Hypertension   Office blood pressures are:   BP Readings from Last 3 Encounters:  02/15/19 108/70  09/18/18 90/70  03/13/18 (!) 150/82   CMP Latest Ref Rng & Units 02/15/2019 06/16/2017 01/10/2017  Glucose 70 - 99 mg/dL 99 90 144(H)  BUN 6 - 23 mg/dL 18 18 16   Creatinine 0.40 - 1.20 mg/dL 1.16 0.89 0.85  Sodium 135 - 145 mEq/L 134(L) 134(L) 130(L)  Potassium 3.5 - 5.1 mEq/L 4.7 4.0 3.8  Chloride 96 - 112 mEq/L 97 97 92(L)  CO2 19 - 32 mEq/L 29 31 27   Calcium 8.4 - 10.5 mg/dL 9.5 9.7 8.8(L)  Total Protein 6.0 - 8.3 g/dL 7.3 7.6 6.9  Total Bilirubin 0.2 - 1.2 mg/dL 0.3 0.3 0.2(L)  Alkaline Phos 39 - 117 U/L 79 72 75  AST 0 - 37 U/L 14 14 17   ALT 0 - 35 U/L 11 11 12(L)     Patient has failed these meds in the past: none  Patient checks BP at home: about once a month  Patient home BP readings are ranging: recently 108/58 mmHg, usually runs around 100 over high 50s, low 60s  Patient is currently controlled on the following medications:   Amlodipine-valsartan 10-160 mg - 1 tablet daily  Furosemide  40 mg - 1 tablet daily   Metoprolol succinate 50 mg - 1 tablet daily with meal  We discussed: Patient reports good blood pressure control. Her sister monitored closely while visiting and denies hypotension. Patient reports occasional dizziness but denies falls. Patient uses a walker due to arthritis. Doesn't check her blood pressure regularly at home. Requested patient's son check blood pressure 3-4 times before visit with Dr. Diona Browner in November.  Plan: Continue current medications; Monitor for hypotension.    COPD / Tobacco   Last spirometry score: none per chart  Eosinophil count:   Lab Results  Component Value Date/Time   EOSPCT 1.0 02/15/2019 03:29 PM  %                               Eos (Absolute):  Lab Results  Component Value Date/Time   EOSABS 0.1 02/15/2019 03:29 PM   Tobacco Status: quit > 40 years ago  Social History   Tobacco Use  Smoking Status Former Smoker  . Packs/day: 0.50  . Years: 42.00  . Pack years: 21.00  . Types: Cigarettes  Smokeless Tobacco Never Used   Patient has failed these meds in past: none Patient is currently controlled on the following medications:   No pharmacotherapy  We discussed: denies shortness of breath   Plan: Continue current medications   Hypothyroidism   TSH 02/15/19 1.27 Patient has failed these meds in past:  Patient is currently controlled on the following medications:   Levothyroxine 100 mg - 1 tablet daily  Plan: Continue current medications   Osteoarthritis   Followed by orthopedics (Dr. Ronnald Ramp) Multiple joints, frequently in pain, shoulders, pain, back, fingers   Patient has failed these meds in past: tramadol - dizziness, Voltaren gel, lidocaine patches Patient is currently uncontrolled on the following medications:   Tylenol 325 mg - 1-2 tablets every 4-6 hours as needed  We discussed: wears ankle and knee brace. Patient states it is harder to get around and get out of her house. She uses a walker and  rarely goes out.    Plan: Continue current medication.  Insomnia  Patient has failed these meds in past: none  Patient is currently controlled on the following medications:   Trazodone 50 mg - 1/2 qhs PRN  We discussed: Patient reports that she sleeps alright with sleeping pill. Gets up every 2 hours due to pain in leg or tingling in hands. She can massage her hands to get them better for a few minutes and fall back to sleep since taking medication.   Plan: Continue current medications   Vaccines   Reviewed and discussed patient's vaccination history.  Has had a flu shot at the pharmacy this year. She has had first 2 [place] vaccines and is wanting to get her third shot soon. She and her son plan to get third dose soon. We discussed weekend clinic options available.   Immunization History  Administered Date(s) Administered  . Fluad Quad(high Dose 65+) 01/19/2019  . Influenza Whole 01/18/2007, 02/15/2008, 01/28/2009, 01/27/2010, 01/27/2011  . Influenza, High Dose Seasonal PF 01/07/2017  . Influenza,inj,Quad PF,6+ Mos 12/29/2012, 01/10/2014, 01/28/2015, 01/28/2016, 01/03/2018  . PPD Test 06/27/2015  . Pneumococcal Conjugate-13 01/10/2014  . Pneumococcal Polysaccharide-23 01/18/2007  . Td 02/22/2014  . Zoster 10/17/2009    Plan  Recommended patient receive third COVID vaccine in office.    Medication Management  OTCs: Tylenol, Senna - PRN   Pharmacy/Benefits: [facility]  Vaccines: recommend third COVID vaccine  CCM Follow Up: 6 months (07/28/2020 @ 1:30 pm)  Sherre Poot, PharmD, [facility] Clinical Pharmacist [facility] [phone] (office) [phone] (mobile)

## 2020-02-11 NOTE — Patient Instructions (Addendum)
Visit Information  Goals Addressed            This Visit's Progress   . Pharmacy Care Plan       CARE PLAN ENTRY (see longitudinal plan of care for additional care plan information)  Current Barriers:  . Chronic Disease Management support, education, and care coordination needs related to Hypertension, Hyperlipidemia, and Diabetes   Hypertension BP Readings from Last 3 Encounters:  02/15/19 108/70  09/18/18 90/70  03/13/18 (!) 150/82   . Pharmacist Clinical Goal(s): o Over the next 90 days, patient will work with PharmD and providers to maintain BP goal <140/90 . Current regimen:   Amlodipine-valsartan 10-160 mg - 1 tablet daily  Furosemide 40 mg - 1 tablet daily   Metoprolol succinate 50 mg - 1 tablet daily with meal . Interventions: o Discussed low sodium in the past. Patient continues to drink gatorade to maintain sodium.  . Patient self care activities - Over the next 90 days, patient will: o Check BP 3-4 times before physical with Dr. Diona Browner in November, document, and provide at future appointments o Ensure daily salt intake < 2300 mg/day  Hyperlipidemia Lab Results  Component Value Date/Time   LDLCALC 123 (H) 04/23/2016 05:16 PM   . Pharmacist Clinical Goal(s): o Over the next 90 days, patient will work with PharmD and providers to achieve LDL goal < 100  . Current regimen:  o Diet and lifestyle . Interventions: o Patient avoid fried foods and mainly eats chicken.  . Patient self care activities - Over the next 90 days, patient will: o Continue to avoid fried/fatty foods.   Prediabetes Lab Results  Component Value Date/Time   HGBA1C 6.0 02/15/2019 03:29 PM   HGBA1C 6.2 01/03/2018 03:04 PM   . Pharmacist Clinical Goal(s): o Over the next 90 days, patient will work with PharmD and providers to maintain A1c goal <6.5% . Current regimen:  o Diet and lifestyle . Interventions: o Patient discussed that she mainly drinks water and avoids soft drinks.   . Patient self care activities - Over the next 90 days, patient will: o Continue to manage blood sugar in diet.   Medication management . Pharmacist Clinical Goal(s): o Over the next 90 days, patient will work with PharmD and providers to maintain optimal medication adherence . Current pharmacy: [facility]  . Interventions o Comprehensive medication review performed. o Continue current medication management strategy . Patient self care activities - Over the next 90 days, patient will: o Focus on medication adherence by taking medication each morning out of bottles o Take medications as prescribed o Report any questions or concerns to PharmD and/or provider(s)  Please see past updates related to this goal by clicking on the "Past Updates" button in the selected goal         Patient verbalizes understanding of instructions provided today.   Telephone follow up appointment with pharmacy team member scheduled for: 07/28/2020 @ 1:30 pm  Sherre Poot, PharmD, [facility] Clinical Pharmacist [facility] [phone] (office) [phone] (mobile)   Blood Pressure Record Sheet To take your blood pressure, you will need a blood pressure machine. You can buy a blood pressure machine (blood pressure monitor) at your clinic, drug store, or online. When choosing one, consider:  An automatic monitor that has an arm cuff.  A cuff that wraps snugly around your upper arm. You should be able to fit only one finger between your arm and the cuff.  A device that stores blood  pressure reading results.  Do not choose a monitor that measures your blood pressure from your wrist or finger. Follow your health care provider's instructions for how to take your blood pressure. To use this form:  Get one reading in the morning (a.m.) before you take any medicines.  Get one reading in the evening (p.m.) before supper.  Take at least 2 readings with each blood pressure check. This  makes sure the results are correct. Wait 1-2 minutes between measurements.  Write down the results in the spaces on this form.  Repeat this once a week, or as told by your health care provider.  Make a follow-up appointment with your health care provider to discuss the results. Blood pressure log Date: _______________________  a.m. _____________________(1st reading) _____________________(2nd reading)  p.m. _____________________(1st reading) _____________________(2nd reading) Date: _______________________  a.m. _____________________(1st reading) _____________________(2nd reading)  p.m. _____________________(1st reading) _____________________(2nd reading) Date: _______________________  a.m. _____________________(1st reading) _____________________(2nd reading)  p.m. _____________________(1st reading) _____________________(2nd reading) Date: _______________________  a.m. _____________________(1st reading) _____________________(2nd reading)  p.m. _____________________(1st reading) _____________________(2nd reading) Date: _______________________  a.m. _____________________(1st reading) _____________________(2nd reading)  p.m. _____________________(1st reading) _____________________(2nd reading) This information is not intended to replace advice given to you by your health care provider. Make sure you discuss any questions you have with your health care provider. Document Revised: 06/03/2017 Document Reviewed: 04/05/2017 Elsevier Patient Education  [place].

## 2020-02-22 ENCOUNTER — Encounter: Payer: Medicare Other | Admitting: Family Medicine

## 2020-02-28 ENCOUNTER — Other Ambulatory Visit: Payer: Self-pay | Admitting: Family Medicine

## 2020-03-04 ENCOUNTER — Encounter: Payer: Self-pay | Admitting: Family Medicine

## 2020-03-04 ENCOUNTER — Ambulatory Visit (INDEPENDENT_AMBULATORY_CARE_PROVIDER_SITE_OTHER): Payer: Medicare Other | Admitting: Family Medicine

## 2020-03-04 ENCOUNTER — Other Ambulatory Visit: Payer: Self-pay

## 2020-03-04 VITALS — BP 112/72 | HR 72 | Temp 97.8°F | Ht 61.5 in | Wt 169.5 lb

## 2020-03-04 DIAGNOSIS — E559 Vitamin D deficiency, unspecified: Secondary | ICD-10-CM

## 2020-03-04 DIAGNOSIS — R7303 Prediabetes: Secondary | ICD-10-CM | POA: Diagnosis not present

## 2020-03-04 DIAGNOSIS — Z0000 Encounter for general adult medical examination without abnormal findings: Secondary | ICD-10-CM

## 2020-03-04 DIAGNOSIS — E039 Hypothyroidism, unspecified: Secondary | ICD-10-CM

## 2020-03-04 DIAGNOSIS — J449 Chronic obstructive pulmonary disease, unspecified: Secondary | ICD-10-CM | POA: Diagnosis not present

## 2020-03-04 DIAGNOSIS — I10 Essential (primary) hypertension: Secondary | ICD-10-CM

## 2020-03-04 NOTE — Assessment & Plan Note (Signed)
Due for re-eval. 

## 2020-03-04 NOTE — Progress Notes (Signed)
Chief Complaint  Patient presents with  . Medicare Wellness    History of Present Illness: HPI  The patient presents for annual medicare wellness, complete physical and review of chronic health problems. He/She also has the following acute concerns today: pain from arthritis in hands, feet.Marland Kitchen tylenol and voltaren gel helps.  I have personally reviewed the Medicare Annual Wellness questionnaire and have noted 1. The patient's medical and social history 2. Their use of alcohol, tobacco or illicit drugs 3. Their current medications and supplements 4. The patient's functional ability including ADL's, fall risks, home safety risks and hearing or visual             impairment. 5. Diet and physical activities 6. Evidence for depression or mood disorders 7.         Updated provider list Cognitive evaluation was performed and recorded on pt medicare questionnaire form. The patients weight, height, BMI and visual acuity have been recorded in the chart  I have made referrals, counseling and provided education to the patient based review of the above and I have provided the pt with a written personalized care plan for preventive services.   Documentation of this information was scanned into the electronic record under the media tab.   Advance directives and end of life planning reviewed in detail with patient and documented in EMR. Patient given handout on advance care directives if needed. HCPOA and living will updated if needed.  No exam data present Fall Risk  03/04/2020 02/15/2019 01/03/2018 04/23/2016 01/10/2014  Falls in the past year? 0 0 No No Yes  Number falls in past yr: - - - - 1  Injury with Fall? - - - - Yes  Risk for fall due to : - - - - Impaired mobility  Risk for fall due to: Comment - - - - Uses Cane with walking      Office Visit from 03/04/2020 in [place] at [facility] Total Score 0     Hypothyroid  Due for re-eval on levo 100 mcg. Lab Results   Component Value Date   TSH 1.27 02/15/2019   Hypertension:    At goal on amlodipine valsartan, metoprolol. BP Readings from Last 3 Encounters:  03/04/20 112/72  02/15/19 108/70  09/18/18 90/70  Using medication without problems or lightheadedness:  none Chest pain with exertion:none Edema:none Short of breath:none Average home BPs: Other issues:   Wt Readings from Last 3 Encounters:  03/04/20 169 lb 8 oz (76.9 kg)  02/15/19 173 lb 12 oz (78.8 kg)  09/18/18 177 lb 8 oz (80.5 kg)    COPD: stable control. No inhaler required.   prediabetes due for re-eval  This visit occurred during the SARS-CoV-2 public health emergency.  Safety protocols were in place, including screening questions prior to the visit, additional usage of staff PPE, and extensive cleaning of exam room while observing appropriate contact time as indicated for disinfecting solutions.   COVID 19 screen:  No recent travel or known exposure to COVID19 The patient denies respiratory symptoms of COVID 19 at this time. The importance of social distancing was discussed today.     Review of Systems  Constitutional: Negative for chills and fever.  HENT: Negative for congestion and ear pain.   Eyes: Negative for pain and redness.  Respiratory: Negative for cough and shortness of breath.   Cardiovascular: Negative for chest pain, palpitations and leg swelling.  Gastrointestinal: Negative for abdominal pain, blood in stool, constipation, diarrhea, nausea  and vomiting.  Genitourinary: Negative for dysuria.  Musculoskeletal: Negative for falls and myalgias.  Skin: Negative for rash.  Neurological: Negative for dizziness.  Psychiatric/Behavioral: Negative for depression. The patient is not nervous/anxious.       Past Medical History:  Diagnosis Date  . Arthritis   . Bacterial pneumonia, unspecified   . Cancer , breast   . Candidiasis of mouth   . Chronic airway obstruction, not elsewhere classified    son not  aware of this  . Encephalitis 1961  . Family history of adverse reaction to anesthesia    son has nausea  . Heart murmur    has had it for years; very mild AS 05/2015 echo  . Hyposmolality and/or hyponatremia   . Lumbago   . Obstructive chronic bronchitis with exacerbation ([place])   . Other abnormal glucose   . Pain in joint, shoulder region   . Palpitations   . Persistent disorder of initiating or maintaining sleep   . Restless leg syndrome   . Unspecified essential hypertension   . Unspecified hypothyroidism   . Unspecified viral infection, in conditions classified elsewhere and of unspecified site   . Unspecified vitamin D deficiency   . Urinary tract infection, site not specified     reports that she has quit smoking. Her smoking use included cigarettes. She has a 21.00 pack-year smoking history. She has never used smokeless tobacco. She reports that she does not drink alcohol and does not use drugs.   Current Outpatient Medications:  .  acetaminophen (TYLENOL) 650 MG CR tablet, Take 650 mg by mouth daily., Disp: , Rfl:  .  amLODipine-valsartan (EXFORGE) 10-160 MG tablet, TAKE 1 TABLET BY MOUTH ONCE DAILY, Disp: 90 tablet, Rfl: 0 .  furosemide (LASIX) 40 MG tablet, TAKE 1 TABLET BY MOUTH ONCE DAILY, Disp: 90 tablet, Rfl: 0 .  levothyroxine (SYNTHROID) 100 MCG tablet, TAKE 1 TABLET BY MOUTH ONCE A DAY, Disp: 90 tablet, Rfl: 3 .  metoprolol succinate (TOPROL-XL) 50 MG 24 hr tablet, TAKE 1 TABLET BY MOUTH ONCE DAILY WITH OR IMMEDIATELY FOLLOWING A MEAL, Disp: 90 tablet, Rfl: 0 .  senna (SENOKOT) 8.6 MG TABS tablet, Take 1 tablet by mouth daily as needed for mild constipation., Disp: , Rfl:  .  traZODone (DESYREL) 50 MG tablet, TAKE 1/2 TO 1 TABLET BY MOUTH AT BEDTIMEAS NEEDED FOR SLEEP, Disp: 90 tablet, Rfl: 1   Observations/Objective: Blood pressure 112/72, pulse 72, temperature 97.8 F (36.6 C), temperature source Temporal, height 5' 1.5" (1.562 m), weight 169 lb 8 oz (76.9 kg), SpO2  97 %.  Physical Exam Vitals and nursing note reviewed.  Constitutional:      General: She is not in acute distress.    Appearance: Normal appearance. She is well-developed. She is not ill-appearing or toxic-appearing.     Comments: Obese, elderly in NAD  HENT:     Head: Normocephalic and atraumatic.     Right Ear: Hearing, tympanic membrane, ear canal and external ear normal. Tympanic membrane is not erythematous, retracted or bulging.     Left Ear: Hearing, tympanic membrane, ear canal and external ear normal. Tympanic membrane is not erythematous, retracted or bulging.     Nose: No mucosal edema or rhinorrhea.     Right Sinus: No maxillary sinus tenderness or frontal sinus tenderness.     Left Sinus: No maxillary sinus tenderness or frontal sinus tenderness.     Mouth/Throat:     Pharynx: Uvula midline. No oropharyngeal exudate.  Eyes:     General: Lids are normal. Lids are everted, no foreign bodies appreciated.     Conjunctiva/sclera: Conjunctivae normal.     Pupils: Pupils are equal, round, and reactive to light.  Neck:     Thyroid: No thyroid mass or thyromegaly.     Vascular: No carotid bruit.     Trachea: Trachea normal.  Cardiovascular:     Rate and Rhythm: Normal rate and regular rhythm.     Pulses: Normal pulses.     Heart sounds: Normal heart sounds, S1 normal and S2 normal. No murmur heard.  No friction rub. No gallop.   Pulmonary:     Effort: Pulmonary effort is normal. No tachypnea or respiratory distress.     Breath sounds: Normal breath sounds. No decreased breath sounds, wheezing, rhonchi or rales.  Abdominal:     General: Bowel sounds are normal.     Palpations: Abdomen is soft.     Tenderness: There is no abdominal tenderness.  Musculoskeletal:     Cervical back: Normal range of motion and neck supple.     Lumbar back: Decreased range of motion.     Right lower leg: No edema.     Left lower leg: Edema present.     Comments: Pitting edema R upper extremity  from hand to shoulder, NO tenderness but no erythema or warmth No significant pedal edema BLE  Skin:    General: Skin is warm and dry.     Findings: No rash.  Neurological:     Mental Status: She is alert and oriented to person, place, and time.     Sensory: Sensory deficit present.     Motor: Atrophy present. No abnormal muscle tone.     Gait: Gait abnormal.     Comments: Strength normal but very limited low body endurance  Psychiatric:        Mood and Affect: Mood is not anxious or depressed.        Speech: Speech normal.        Behavior: Behavior normal. Behavior is cooperative.        Thought Content: Thought content normal.        Judgment: Judgment normal.      Assessment and Plan  The patient's preventative maintenance and recommended screening tests for an annual wellness exam were reviewed in full today. Brought up to date unless services declined.  Counselled on the importance of diet, exercise, and its role in overall health and mortality. The patient's FH and SH was reviewed, including their home life, tobacco status, and drug and alcohol status.   Vaccines: Uptodate with shingles and pneumonia vaccines, td, COVID x2 .. per pt got flu shot. Mammo:Nml 01/2011.Marland Kitchen Hx of breast cancer 1986.Marland Kitchen She is not interested in further mammograms and breast exams given she would not do aggressive treatment. Colon: last 2003, no further indicated, no family history  DVE/pap: not indicated DXA: sister nml, no family history... Has never had. She refuses this at this time, again refused 2020 Decreased hearing, tinnitus  Hypothyroidism Due for re-eval.  COPD (chronic obstructive pulmonary disease) ([place]) stable control. No inhaler required.   Prediabetes Due for re-eval.  Essential hypertension Well controlled. Continue current medication.      Eliezer Lofts, MD

## 2020-03-04 NOTE — Assessment & Plan Note (Signed)
stable control. No inhaler required.

## 2020-03-04 NOTE — Patient Instructions (Addendum)
Please stop at the lab to have labs drawn.  Plan on booster dose of COVID vaccine #3

## 2020-03-04 NOTE — Assessment & Plan Note (Signed)
Well controlled. Continue current medication.  

## 2020-03-05 ENCOUNTER — Encounter: Payer: Self-pay | Admitting: *Deleted

## 2020-03-05 LAB — CBC WITH DIFFERENTIAL/PLATELET
Basophils Absolute: 0.1 10*3/uL (ref 0.0–0.1)
Basophils Relative: 1.1 % (ref 0.0–3.0)
Eosinophils Absolute: 0.1 10*3/uL (ref 0.0–0.7)
Eosinophils Relative: 1.6 % (ref 0.0–5.0)
HCT: 42 % (ref 36.0–46.0)
Hemoglobin: 14 g/dL (ref 12.0–15.0)
Lymphocytes Relative: 27.1 % (ref 12.0–46.0)
Lymphs Abs: 1.9 10*3/uL (ref 0.7–4.0)
MCHC: 33.4 g/dL (ref 30.0–36.0)
MCV: 95.2 fl (ref 78.0–100.0)
Monocytes Absolute: 0.7 10*3/uL (ref 0.1–1.0)
Monocytes Relative: 9.7 % (ref 3.0–12.0)
Neutro Abs: 4.3 10*3/uL (ref 1.4–7.7)
Neutrophils Relative %: 60.5 % (ref 43.0–77.0)
Platelets: 188 10*3/uL (ref 150.0–400.0)
RBC: 4.41 Mil/uL (ref 3.87–5.11)
RDW: 13.7 % (ref 11.5–15.5)
WBC: 7.1 10*3/uL (ref 4.0–10.5)

## 2020-03-05 LAB — T3, FREE: T3, Free: 2.8 pg/mL (ref 2.3–4.2)

## 2020-03-05 LAB — COMPREHENSIVE METABOLIC PANEL
ALT: 14 U/L (ref 0–35)
AST: 16 U/L (ref 0–37)
Albumin: 4.5 g/dL (ref 3.5–5.2)
Alkaline Phosphatase: 84 U/L (ref 39–117)
BUN: 18 mg/dL (ref 6–23)
CO2: 30 mEq/L (ref 19–32)
Calcium: 9.6 mg/dL (ref 8.4–10.5)
Chloride: 96 mEq/L (ref 96–112)
Creatinine, Ser: 1.17 mg/dL (ref 0.40–1.20)
GFR: 40.02 mL/min — ABNORMAL LOW (ref >60.00)
Glucose, Bld: 105 mg/dL — ABNORMAL HIGH (ref 70–99)
Potassium: 4.6 mEq/L (ref 3.5–5.1)
Sodium: 133 mEq/L — ABNORMAL LOW (ref 135–145)
Total Bilirubin: 0.4 mg/dL (ref 0.2–1.2)
Total Protein: 7.6 g/dL (ref 6.0–8.3)

## 2020-03-05 LAB — TSH: TSH: 2.24 u[IU]/mL (ref 0.35–4.50)

## 2020-03-05 LAB — T4, FREE: Free T4: 1.19 ng/dL (ref 0.60–1.60)

## 2020-03-05 LAB — HEMOGLOBIN A1C: Hgb A1c MFr Bld: 6.2 % (ref 4.6–6.5)

## 2020-03-25 ENCOUNTER — Telehealth: Payer: Self-pay | Admitting: *Deleted

## 2020-03-25 MED ORDER — TRAMADOL HCL 50 MG PO TABS
25.0000 mg | ORAL_TABLET | Freq: Four times a day (QID) | ORAL | 0 refills | Status: DC | PRN
Start: 2020-03-25 — End: 2020-03-27

## 2020-03-25 NOTE — Telephone Encounter (Signed)
Patient's son [name] (on [place]) left a voicemail stating that his mom was in recently to see Dr. Diona Browner. Patient's son stated that Dr. Diona Browner offered his mom some medication for pain which she declined at the time. Bruce stated that now is mom is requesting something for her arthritis pain. Patient's son requested a call back about a prescription for his mom's pain. Pharmacy [place]

## 2020-03-26 ENCOUNTER — Other Ambulatory Visit: Payer: Self-pay | Admitting: Family Medicine

## 2020-03-26 NOTE — Telephone Encounter (Signed)
Patient's son left a voicemail stating that he had left a message yesterday about pain medication for his mom and had requested a call back.

## 2020-03-26 NOTE — Telephone Encounter (Signed)
Spoke to patient's son and advised him that Dr. Diona Browner sent in Tramadol for his mom. Mr. Calvi stated that his mom was given Tramadol last year and was not able to take it because it caused dizziness and nausea. Mr. Wendland requested something different. [place]

## 2020-03-27 MED ORDER — HYDROCODONE-ACETAMINOPHEN 5-325 MG PO TABS
0.5000 | ORAL_TABLET | Freq: Two times a day (BID) | ORAL | 0 refills | Status: DC | PRN
Start: 2020-03-27 — End: 2020-04-01

## 2020-03-27 NOTE — Telephone Encounter (Signed)
Sent in hydrocodone.. have pt use 1/2 tab at first.. lowest effective dose... limit to 1-2 times daily or less. I can only send in 5 day supply at first given new narcotic prescription. If helping can refill.

## 2020-03-27 NOTE — Addendum Note (Signed)
Addended by: Eliezer Lofts E on: 03/27/2020 10:54 AM   Modules accepted: Orders

## 2020-03-27 NOTE — Telephone Encounter (Signed)
Bruce notified as instructed by telephone.  States understanding.

## 2020-03-31 ENCOUNTER — Telehealth: Payer: Self-pay | Admitting: *Deleted

## 2020-03-31 NOTE — Telephone Encounter (Signed)
Patient's son left a voicemail stating that the pain medication sent in last week for her has really done well for his mom's pain. Patient's son requested that a full prescription be sent in for his mom for the Hydrocodone. [place]

## 2020-04-01 MED ORDER — HYDROCODONE-ACETAMINOPHEN 5-325 MG PO TABS
0.5000 | ORAL_TABLET | Freq: Two times a day (BID) | ORAL | 0 refills | Status: DC | PRN
Start: 2020-04-01 — End: 2020-05-20

## 2020-04-01 NOTE — Telephone Encounter (Signed)
Pt's son stated that pt for the last couple of days has only been having to use a 1/2 tablet at bedtime.

## 2020-04-01 NOTE — Telephone Encounter (Signed)
How often has she been using it?

## 2020-04-15 ENCOUNTER — Other Ambulatory Visit: Payer: Self-pay | Admitting: Family Medicine

## 2020-04-15 ENCOUNTER — Encounter: Payer: Self-pay | Admitting: Family Medicine

## 2020-04-15 ENCOUNTER — Other Ambulatory Visit: Payer: Medicare Other

## 2020-04-15 ENCOUNTER — Telehealth (INDEPENDENT_AMBULATORY_CARE_PROVIDER_SITE_OTHER): Payer: Medicare Other | Admitting: Family Medicine

## 2020-04-15 DIAGNOSIS — R059 Cough, unspecified: Secondary | ICD-10-CM

## 2020-04-15 MED ORDER — BENZONATATE 100 MG PO CAPS: 100.0000 mg | ORAL_CAPSULE | Freq: Three times a day (TID) | ORAL | 0 refills | Status: DC | PRN

## 2020-04-15 NOTE — Progress Notes (Signed)
Patient ID: Diana [name], female    DOB: [birth year], 86 y.o.   MRN: [id number]  Virtual visit completed through MyChart, a video enabled telemedicine application. Due to national recommendations of social distancing due to COVID-19, a virtual visit is felt to be most appropriate for this patient at this time. Reviewed limitations, risks, security and privacy concerns of performing a virtual visit and the availability of in person appointments. I also reviewed that there may be a patient responsible charge related to this service. The patient agreed to proceed.   Patient location: home Provider location: [place] at [facility], office Persons participating in this virtual visit: patient, provider, son Diana [name]  If any vitals were documented, they were collected by patient at home unless specified below.    BP 119/66   Pulse 81   Temp 97.6 F (36.4 C)   Ht 5' 1.5" (1.562 m)   BMI 31.51 kg/m    CC: cough, congestion Subjective:   HPI: Diana [name] is a 86 y.o. female presenting on 04/15/2020 for Cough (C/o cough and chest congestion.  Sxs about 3 days ago.  Denies any other sxs. Tried Mucinex DM and Delsym. )   3d h/o cough, congestion, hoarseness. Initial ST and productive cough on day 1, now cough is dry. Predominantly throat/upper chest congestion. Sleeping ok despite cough.   No fevers/chills, ear pain, ST, PNdrainage, abd pain, nausea, diarrhea, worsening body aches, HA. No wheezing or dyspnea.   Tried mucinex DM and delsym with some benefit.   Received Pfizer vaccine 04/2019, 05/2019. Has not received booster yet.  No known COVID exposure.   No h/o asthma, no recent smoking. They deny h/o COPD (although on problem list), not on any breathing medications.     Relevant past medical, surgical, family and social history reviewed and updated as indicated. Interim medical history since our last visit reviewed. Allergies and medications reviewed and updated. Outpatient  Medications Prior to Visit  Medication Sig Dispense Refill  . amLODipine-valsartan (EXFORGE) 10-160 MG tablet TAKE 1 TABLET BY MOUTH ONCE DAILY 90 tablet 3  . furosemide (LASIX) 40 MG tablet TAKE 1 TABLET BY MOUTH ONCE DAILY 90 tablet 0  . HYDROcodone-acetaminophen (NORCO/VICODIN) 5-325 MG tablet Take 0.5-1 tablets by mouth every 12 (twelve) hours as needed for severe pain. 60 tablet 0  . levothyroxine (SYNTHROID) 100 MCG tablet TAKE 1 TABLET BY MOUTH ONCE A DAY 90 tablet 3  . metoprolol succinate (TOPROL-XL) 50 MG 24 hr tablet TAKE 1 TABLET BY MOUTH ONCE DAILY WITH OR IMMEDIATELY FOLLOWING A MEAL 90 tablet 0  . senna (SENOKOT) 8.6 MG TABS tablet Take 1 tablet by mouth daily as needed for mild constipation.    . traZODone (DESYREL) 50 MG tablet TAKE 1/2 TO 1 TABLET BY MOUTH AT BEDTIMEAS NEEDED FOR SLEEP 90 tablet 1  . acetaminophen (TYLENOL) 650 MG CR tablet Take 650 mg by mouth daily.     No facility-administered medications prior to visit.     Per HPI unless specifically indicated in ROS section below Review of Systems Objective:  BP 119/66   Pulse 81   Temp 97.6 F (36.4 C)   Ht 5' 1.5" (1.562 m)   BMI 31.51 kg/m   Wt Readings from Last 3 Encounters:  03/04/20 169 lb 8 oz (76.9 kg)  02/15/19 173 lb 12 oz (78.8 kg)  09/18/18 177 lb 8 oz (80.5 kg)       Physical exam: Gen: alert, NAD, not ill appearing  Pulm: speaks in complete sentences without increased work of breathing, intermittent cough present Psych: normal mood, normal thought content       Assessment & Plan:   Problem List Items Addressed This Visit    Cough    Will bring in for COVID swab. Anticipate viral URI with cough.  Rx tessalon perls and rec supportive care at home.  Given age low threshold for abx and/or steroid course pending symptoms over next few days.  Advised keep [place] closely updated over next 1-2 days with symptoms.           Meds ordered this encounter  Medications  . benzonatate  (TESSALON) 100 MG capsule    Sig: Take 1 capsule (100 mg total) by mouth 3 (three) times daily as needed for cough.    Dispense:  30 capsule    Refill:  0   No orders of the defined types were placed in this encounter.   I discussed the assessment and treatment plan with the patient. The patient was provided an opportunity to ask questions and all were answered. The patient agreed with the plan and demonstrated an understanding of the instructions. The patient was advised to call back or seek an in-person evaluation if the symptoms worsen or if the condition fails to improve as anticipated.  Follow up plan: No follow-ups on file.  Eustaquio Boyden, MD

## 2020-04-15 NOTE — Assessment & Plan Note (Signed)
Will bring in for COVID swab. Anticipate viral URI with cough.  Rx tessalon perls and rec supportive care at home.  Given age low threshold for abx and/or steroid course pending symptoms over next few days.  Advised keep [place] closely updated over next 1-2 days with symptoms.

## 2020-04-16 LAB — NOVEL CORONAVIRUS, NAA: SARS-CoV-2, NAA: NOT DETECTED

## 2020-04-16 LAB — SARS-COV-2, NAA 2 DAY TAT

## 2020-04-18 ENCOUNTER — Other Ambulatory Visit: Payer: Self-pay | Admitting: Family Medicine

## 2020-04-18 MED ORDER — AZITHROMYCIN 250 MG PO TABS: ORAL_TABLET | ORAL | 0 refills | Status: DC

## 2020-04-21 ENCOUNTER — Other Ambulatory Visit: Payer: Self-pay | Admitting: Family Medicine

## 2020-05-12 ENCOUNTER — Telehealth: Payer: Self-pay

## 2020-05-12 NOTE — Chronic Care Management (AMB) (Addendum)
Chronic Care Management Pharmacy Assistant   Name: Diana [name]  MRN: [id number] DOB: 1923-08-11  Reason for Encounter: Disease State- General Adherence Review  Patient Questions:  1.  Have you seen any other providers since your last visit? Yes 04/15/20- Dr. Garlon Hatchet [name]-Video visit- PCP 03/04/20- Dr. Eliezer [name]- PCP   2.  Any changes in your medicines or health? Yes 04/15/20 Dr. Danise [name] started patient on short course of Benzonatate 100 mg. 03/25/20- Dr. Diona [name] refilled Hydrocodone 5/325 0.5-1 tablet twice a day.   PCP : Diana Sanders, MD  Allergies:   Allergies  Allergen Reactions   Meperidine Hcl Nausea And Vomiting    Medications: Outpatient Encounter Medications as of 05/12/2020  Medication Sig   amLODipine-valsartan (EXFORGE) 10-160 MG tablet TAKE 1 TABLET BY MOUTH ONCE DAILY   azithromycin (ZITHROMAX) 250 MG tablet Take two tablets on day one followed by one tablet on days 2-5   benzonatate (TESSALON) 100 MG capsule Take 1 capsule (100 mg total) by mouth 3 (three) times daily as needed for cough.   furosemide (LASIX) 40 MG tablet TAKE 1 TABLET BY MOUTH ONCE DAILY   HYDROcodone-acetaminophen (NORCO/VICODIN) 5-325 MG tablet Take 0.5-1 tablets by mouth every 12 (twelve) hours as needed for severe pain.   levothyroxine (SYNTHROID) 100 MCG tablet TAKE 1 TABLET BY MOUTH ONCE A DAY   metoprolol succinate (TOPROL-XL) 50 MG 24 hr tablet TAKE 1 TABLET BY MOUTH ONCE DAILY WITH OR IMMEDIATELY FOLLOWING A MEAL   senna (SENOKOT) 8.6 MG TABS tablet Take 1 tablet by mouth daily as needed for mild constipation.   traZODone (DESYREL) 50 MG tablet TAKE 1/2 TO 1 TABLET BY MOUTH AT [facility] NEEDED FOR SLEEP   No facility-administered encounter medications on file as of 05/12/2020.    Current Diagnosis: Patient Active Problem List   Diagnosis Date Noted   Burning with urination 03/01/2018   Sore throat 12/27/2017   Iron deficiency anemia due to chronic blood loss  06/16/2017   Lymphedema of right arm 11/17/2016   Vaginal irritation 10/26/2016   Right-sided low back pain without sciatica 08/22/2015   Spinal stenosis in cervical region 08/07/2015   Cervical spondylosis with myelopathy 06/26/2015   Aortic valve calcification 06/03/2015   BPV (benign positional vertigo) 01/29/2015   Dizziness 08/27/2014   Rash 01/03/2013   Poor balance 10/07/2011   Cough 04/28/2011   History of right breast cancer 03/29/2011   Vitamin D deficiency 10/17/2009   Prediabetes 10/17/2009   COPD (chronic obstructive pulmonary disease) ([place]) 12/19/2007   INSOMNIA, CHRONIC 10/25/2007   BACK PAIN, LUMBAR 03/01/2007   Hypothyroidism 01/18/2007   HYPONATREMIA, MILD 01/18/2007   Essential hypertension 01/18/2007   Contacted Ms. Diana [name] for general adherence call. Since last visit with CPP Diana [name], 02/11/20, no interventions have been made in regards to adherence medications. On 04/15/20 Dr. Danise [name] started patient on short course of Benzonatate 100 mg and on 03/25/20- Dr. Diona [name] prescribed Hydrocodone 5/325 0.5-1 tablet twice a day.  The patient has not had an ED visit since their last CPP follow up. The patients current Chronic and PDC medications are: Exforge 10-160 mg takes 1 tablet daily.  Diana [name], Pharm. D, to review for a greater than 5 day gap between last fill.The patient has not had problems with their health. Patient does note that she is "riddled with arthritis and it makes it hard for her to get around. She is fairly sedentary because she is scared she will fall. The  patient denies any problems with their pharmacy, states her son picks her medications up for her. The patient has not had any side effects with their medicines. The patient has no recommendations for improvements in managing care. Patient did not have any blood pressure readings available at this time. Advised her I would contact her next month if she did not mind writing some readings down. She  was okay with this plan and stated that her son does check her blood pressure periodically.   Follow-Up:  Pharmacist Review   Diana [name], CPP notified  Diana [name], [place] Assistant [phone]  Refills reviewed. No adherence concerns. Will review arthritis, bone health, fall risk review at next CCM visit. I have reviewed the care management and care coordination activities outlined in this encounter and I am certifying that I agree with the content of this note. No further action required.  Diana [name], PharmD Clinical Pharmacist [facility] at [facility] [phone]

## 2020-05-19 ENCOUNTER — Other Ambulatory Visit: Payer: Self-pay | Admitting: Family Medicine

## 2020-05-19 NOTE — Telephone Encounter (Signed)
Last office visit 04/15/2020 with Dr. [name] for cough.  Last refilled 04/01/2020 for #60 with no refills.  No future appointments with PCP.

## 2020-05-22 DIAGNOSIS — H04123 Dry eye syndrome of bilateral lacrimal glands: Secondary | ICD-10-CM | POA: Diagnosis not present

## 2020-05-22 DIAGNOSIS — H43813 Vitreous degeneration, bilateral: Secondary | ICD-10-CM | POA: Diagnosis not present

## 2020-05-22 DIAGNOSIS — H52202 Unspecified astigmatism, left eye: Secondary | ICD-10-CM | POA: Diagnosis not present

## 2020-05-22 DIAGNOSIS — H26493 Other secondary cataract, bilateral: Secondary | ICD-10-CM | POA: Diagnosis not present

## 2020-06-05 ENCOUNTER — Telehealth: Payer: Self-pay

## 2020-06-05 ENCOUNTER — Other Ambulatory Visit: Payer: Self-pay | Admitting: Family Medicine

## 2020-06-05 NOTE — Chronic Care Management (AMB) (Addendum)
Chronic Care Management Pharmacy Assistant   Name: Diana [name]  MRN: [id number] DOB: [birth year]  Reason for Encounter: Disease state - Update BP log  PCP : Jinny Sanders, MD  Allergies:   Allergies  Allergen Reactions   Meperidine Hcl Nausea And Vomiting    Medications: Outpatient Encounter Medications as of 06/05/2020  Medication Sig   HYDROcodone-acetaminophen (NORCO/VICODIN) 5-325 MG tablet TAKE 1/2 TO 1 TABLET BY MOUTH EVERY 12 HOURS AS NEEDED FOR SEVERE PAIN.   amLODipine-valsartan (EXFORGE) 10-160 MG tablet TAKE 1 TABLET BY MOUTH ONCE DAILY   azithromycin (ZITHROMAX) 250 MG tablet Take two tablets on day one followed by one tablet on days 2-5   benzonatate (TESSALON) 100 MG capsule Take 1 capsule (100 mg total) by mouth 3 (three) times daily as needed for cough.   furosemide (LASIX) 40 MG tablet TAKE 1 TABLET BY MOUTH ONCE DAILY   levothyroxine (SYNTHROID) 100 MCG tablet TAKE 1 TABLET BY MOUTH ONCE A DAY   metoprolol succinate (TOPROL-XL) 50 MG 24 hr tablet TAKE 1 TABLET BY MOUTH ONCE DAILY WITH OR IMMEDIATELY FOLLOWING A MEAL   senna (SENOKOT) 8.6 MG TABS tablet Take 1 tablet by mouth daily as needed for mild constipation.   traZODone (DESYREL) 50 MG tablet TAKE 1/2 TO 1 TABLET BY MOUTH AT [facility] NEEDED FOR SLEEP   No facility-administered encounter medications on file as of 06/05/2020.    Current Diagnosis: Patient Active Problem List   Diagnosis Date Noted   Burning with urination 03/01/2018   Sore throat 12/27/2017   Iron deficiency anemia due to chronic blood loss 06/16/2017   Lymphedema of right arm 11/17/2016   Vaginal irritation 10/26/2016   Right-sided low back pain without sciatica 08/22/2015   Spinal stenosis in cervical region 08/07/2015   Cervical spondylosis with myelopathy 06/26/2015   Aortic valve calcification 06/03/2015   BPV (benign positional vertigo) 01/29/2015   Dizziness 08/27/2014   Rash 01/03/2013   Poor balance 10/07/2011    Cough 04/28/2011   History of right breast cancer 03/29/2011   Vitamin D deficiency 10/17/2009   Prediabetes 10/17/2009   COPD (chronic obstructive pulmonary disease) ([place]) 12/19/2007   INSOMNIA, CHRONIC 10/25/2007   BACK PAIN, LUMBAR 03/01/2007   Hypothyroidism 01/18/2007   HYPONATREMIA, MILD 01/18/2007   Essential hypertension 01/18/2007     Reviewed chart prior to disease state call. Spoke with patient regarding BP  Recent Office Vitals: BP Readings from Last 3 Encounters:  04/15/20 119/66  03/04/20 112/72  02/15/19 108/70   Pulse Readings from Last 3 Encounters:  04/15/20 81  03/04/20 72  02/15/19 77    Wt Readings from Last 3 Encounters:  03/04/20 169 lb 8 oz (76.9 kg)  02/15/19 173 lb 12 oz (78.8 kg)  09/18/18 177 lb 8 oz (80.5 kg)     Kidney Function Lab Results  Component Value Date/Time   CREATININE 1.17 03/04/2020 03:54 PM   CREATININE 1.16 02/15/2019 03:29 PM   CREATININE 1.20 (H) 01/07/2017 04:51 PM   CREATININE 0.97 (H) 04/23/2016 05:16 PM   GFR 40.02 (L) 03/04/2020 03:54 PM   GFRNONAA 58 (L) 01/10/2017 12:48 PM   GFRAA >60 01/10/2017 12:48 PM    BMP Latest Ref Rng & Units 03/04/2020 02/15/2019 06/16/2017  Glucose 70 - 99 mg/dL 105(H) 99 90  BUN 6 - 23 mg/dL 18 18 18   Creatinine 0.40 - 1.20 mg/dL 1.17 1.16 0.89  BUN/Creat Ratio 6 - 22 (calc) - - -  Sodium 135 -  145 mEq/L 133(L) 134(L) 134(L)  Potassium 3.5 - 5.1 mEq/L 4.6 4.7 4.0  Chloride 96 - 112 mEq/L 96 97 97  CO2 19 - 32 mEq/L 30 29 31   Calcium 8.4 - 10.5 mg/dL 9.6 9.5 9.7     Current antihypertensive regimen:  Amlodipine-valsartan 10-160 mg - 1 tablet daily Furosemide 40 mg - 1 tablet daily  Metoprolol succinate 50 mg - 1 tablet daily with meal  How often are you checking your Blood Pressure? 1-2x per week  she checks her blood pressure in the evening after taking her medication. Son states he still works during the day so he is only able to check her blood pressure in the evening  around evening meal.   Current home BP readings:   DATE:             BP               PULSE 06/07/20 163/71  N/A   Wrist or arm cuff: Arm Caffeine intake: Couple cups of coffee, and glass of tea during the day.  Salt intake: Does not add any additional salt to food OTC medications including pseudoephedrine or NSAIDs? No  Adherence Review: Is the patient currently on ACE/ARB medication? Yes Does the patient have >5 day gap between last estimated fill dates? CPP to review  Spoke with Son, [name], states he has only been able to check her pulse once recently. Asked him to check a couple of times a week and would follow back up with him the first week of March. He agreed with this plan.   Follow-Up:  Pharmacist Review  Debbora Dus, CPP notified  Margaretmary Dys, [place] Assistant [phone]  I have reviewed the care management and care coordination activities outlined in this encounter and I am certifying that I agree with the content of this note. Would like to ensure no symptoms of hyper or hypotension and de-prescribe when possible considering age.   Debbora Dus, PharmD Clinical Pharmacist [facility] at [facility] [phone]

## 2020-06-13 NOTE — Chronic Care Management (AMB) (Addendum)
Patient's son, [name], called back with readings. He does not have pulse readings available.  Current antihypertensive regimen:  Amlodipine-valsartan 10-160 mg - 1 tablet daily Furosemide 40 mg - 1 tablet daily  Metoprolol succinate 50 mg - 1 tablet daily with meal  How often are you checking your Blood Pressure? 1-2x per week  she checks her blood pressure in the evening after taking her medication.  Current home BP readings:   DATE:             BP               PULSE 06/09/20 99/52  N/A 06/11/20 104/62  N/A 06/12/20 120/64  N/A  Wrist or arm cuff: Arm Caffeine intake: Couple cups of coffee, and glass of tea during the day.  Salt intake: Does not add any additional salt to food. OTC medications including pseudoephedrine or NSAIDs? No  Adherence Review: Is the patient currently on ACE/ARB medication? Yes Does the patient have >5 day gap between last estimated fill dates? CPP to review.  Follow-Up:  Pharmacist Review  Debbora Dus, CPP notified  Margaretmary Dys, [place] Assistant [phone]  I have reviewed the care management and care coordination activities outlined in this encounter and I am certifying that I agree with the content of this note.   Debbora Dus, PharmD Clinical Pharmacist [facility] at [facility] [phone]

## 2020-06-13 NOTE — Chronic Care Management (AMB) (Addendum)
Contacted Bruce, patient's son, to review updated blood pressure readings. Call went straight to voicemail. Left message asking patient to return call.  Follow-Up:  Pharmacist Review  Debbora Dus, CPP notified  Margaretmary Dys, [place] [facility] Assistant [phone]

## 2020-06-19 ENCOUNTER — Other Ambulatory Visit: Payer: Self-pay | Admitting: Family Medicine

## 2020-06-19 NOTE — Telephone Encounter (Signed)
Last office visit 04/15/2020 with Dr. [name] for cough.  Last refilled 05/20/2020 for #60 with no refills.  No future appointments with PCP.

## 2020-07-21 ENCOUNTER — Telehealth: Payer: Self-pay

## 2020-07-21 DIAGNOSIS — H9313 Tinnitus, bilateral: Secondary | ICD-10-CM

## 2020-07-21 NOTE — Telephone Encounter (Signed)
Please clarify... is he requesting referral to ENT or audiologist.  Is it just hearing loss or additional issues?

## 2020-07-21 NOTE — Telephone Encounter (Signed)
Bruce Ferrante(DPR signed) left v/m requesting cb; Bruce said pt continuing with hearing problem that seems to be worsening and request referral to specialist in [place]; if no one available in [place] pt will go to [place]. Bruce request cb. Pt last medicare wellness was 03/04/2020.Please advise.

## 2020-07-21 NOTE — Telephone Encounter (Signed)
Spoke with Diana [name].  Diana [name] is having ringing and noises in her ears.  It is waking her up at night.  Prefers to see an ENT in [place].

## 2020-07-22 ENCOUNTER — Other Ambulatory Visit: Payer: Self-pay | Admitting: Family Medicine

## 2020-07-22 NOTE — Telephone Encounter (Signed)
Please call patient and schedule appointment as instructed. 

## 2020-07-22 NOTE — Telephone Encounter (Signed)
Last office visit 04/15/2020 with Dr. [name] for cough.  Last refilled 06/19/2020 for #60 with no refills.  No future appointments with PCP.

## 2020-07-22 NOTE — Telephone Encounter (Signed)
Needs appt for 3 month follow up chronic pain management.

## 2020-07-22 NOTE — Addendum Note (Signed)
Addended by: Eliezer Lofts E on: 07/22/2020 05:43 PM   Modules accepted: Orders

## 2020-07-23 NOTE — Telephone Encounter (Signed)
Follow up appointment scheduled.

## 2020-07-25 ENCOUNTER — Ambulatory Visit: Payer: Medicare Other | Admitting: Family Medicine

## 2020-07-28 ENCOUNTER — Other Ambulatory Visit: Payer: Self-pay | Admitting: *Deleted

## 2020-07-28 ENCOUNTER — Ambulatory Visit (INDEPENDENT_AMBULATORY_CARE_PROVIDER_SITE_OTHER): Payer: Medicare Other

## 2020-07-28 ENCOUNTER — Other Ambulatory Visit: Payer: Self-pay

## 2020-07-28 DIAGNOSIS — J449 Chronic obstructive pulmonary disease, unspecified: Secondary | ICD-10-CM

## 2020-07-28 DIAGNOSIS — I10 Essential (primary) hypertension: Secondary | ICD-10-CM | POA: Diagnosis not present

## 2020-07-28 MED ORDER — HYDROCODONE-ACETAMINOPHEN 5-325 MG PO TABS: ORAL_TABLET | ORAL | 0 refills | Status: DC

## 2020-07-28 NOTE — Telephone Encounter (Addendum)
Patient's son Diana [name] left a voicemail stating that his mom was suppose to have been seen Dr, Diona Browner last week but the appointment got rescheduled. Diana [name] stated that his mom's appointment has been rescheduled to 08/05/20. Patient's son wants to know if Dr. Diona Browner will give her enough pain medication to last her until her upcoming appointment. Last refill 06/19/20 #60 Last office visit 04/15/20 video/acute Upcoming appointment 08/05/20

## 2020-07-28 NOTE — Addendum Note (Signed)
Addended byEliezer Lofts E on: 07/28/2020 11:45 AM   Modules accepted: Orders

## 2020-07-28 NOTE — Progress Notes (Signed)
Chronic Care Management Pharmacy Note  07/30/2020 Name:  Diana [name] MRN:  [id number] DOB:  1923-12-09  Subjective: Diana [name] is an 87 y.o. year old female who is a primary patient of Bedsole, Amy E, MD.  The CCM team was consulted for assistance with disease management and care coordination needs.    Engaged with patient by telephone for follow up visit in response to provider referral for pharmacy case management and/or care coordination services. Spoke with Diana [name], patient's son, for call today. He reports patient is doing well other than arthritis.  Consent to Services:  The patient was given information about Chronic Care Management services, agreed to services, and gave verbal consent prior to initiation of services.  Please see initial visit note for detailed documentation.   Patient Care Team: Jinny Sanders, MD as PCP - General Debbora Dus, [facility] as Pharmacist (Pharmacist)  Recent office visits: 04/15/20 - Video visit, cough - benzonatate, azithromycin course 03/04/20 - PCP - pain from arthritis in hands, feet.Marland Kitchen tylenol and voltaren gel helps. Copd stable, no inhaler required. Pre-DM due for re-eval. Hypothyroid, due for re-eval. HTN well controlled, continue current meds.  Recent consult visits: None in previous 6 months  Hospital visits: None in previous 6 months  Objective:  Lab Results  Component Value Date   CREATININE 1.17 03/04/2020   BUN 18 03/04/2020   GFR 40.02 (L) 03/04/2020   GFRNONAA 58 (L) 01/10/2017   GFRAA >60 01/10/2017   NA 133 (L) 03/04/2020   K 4.6 03/04/2020   CALCIUM 9.6 03/04/2020   CO2 30 03/04/2020   GLUCOSE 105 (H) 03/04/2020   Lab Results  Component Value Date/Time   HGBA1C 6.2 03/04/2020 03:54 PM   HGBA1C 6.0 02/15/2019 03:29 PM   GFR 40.02 (L) 03/04/2020 03:54 PM   GFR 43.58 (L) 02/15/2019 03:29 PM    Lab Results  Component Value Date   CHOL 197 04/23/2016   HDL 46 (L) 04/23/2016   LDLCALC 123 (H) 04/23/2016    TRIG 138 04/23/2016   CHOLHDL 4.3 04/23/2016    Hepatic Function Latest Ref Rng & Units 03/04/2020 02/15/2019 06/16/2017  Total Protein 6.0 - 8.3 g/dL 7.6 7.3 7.6  Albumin 3.5 - 5.2 g/dL 4.5 4.5 3.9  AST 0 - 37 U/L _0 ALT 0 - 35 U/L _1 Alk Phosphatase 39 - 117 U/L 84 79 72  Total Bilirubin 0.2 - 1.2 mg/dL 0.4 0.3 0.3  Bilirubin, Direct 0.0 - 0.3 mg/dL - - -    Lab Results  Component Value Date/Time   TSH 2.24 03/04/2020 03:54 PM   TSH 1.27 02/15/2019 03:29 PM   FREET4 1.19 03/04/2020 03:54 PM   FREET4 1.06 02/15/2019 03:29 PM    CBC Latest Ref Rng & Units 03/04/2020 02/15/2019 01/03/2018  WBC 4.0 - 10.5 K/uL 7.1 7.8 8.7  Hemoglobin 12.0 - 15.0 g/dL 14.0 13.2 13.4  Hematocrit 36.0 - 46.0 % 42.0 39.4 39.5  Platelets 150.0 - 400.0 K/uL 188.0 191.0 249.0    Lab Results  Component Value Date/Time   VD25OH 32.91 02/15/2019 03:29 PM   VD25OH 73.62 01/03/2018 03:04 PM    Clinical ASCVD: No  The ASCVD Risk score Mikey Bussing DC Jr., et al., 2013) failed to calculate for the following reasons:   The 2013 ASCVD risk score is only valid for ages 61 to 8    Depression screen PHQ 2/9 03/04/2020 02/15/2019 01/03/2018  Decreased Interest 0 0 1  Down,  Depressed, Hopeless 0 0 0  PHQ - 2 Score 0 0 1  Altered sleeping - - 3  Tired, decreased energy - - 0  Change in appetite - - 0  Feeling bad or failure about yourself  - - 0  Trouble concentrating - - 0  Moving slowly or fidgety/restless - - 0  Suicidal thoughts - - 0  PHQ-9 Score - - 4  Difficult doing work/chores - - Not difficult at all    Social History   Tobacco Use  Smoking Status Former Smoker  . Packs/day: 0.50  . Years: 42.00  . Pack years: 21.00  . Types: Cigarettes  Smokeless Tobacco Never Used   BP Readings from Last 3 Encounters:  04/15/20 119/66  03/04/20 112/72  02/15/19 108/70   Pulse Readings from Last 3 Encounters:  04/15/20 81  03/04/20 72  02/15/19 77   Wt Readings from Last 3 Encounters:   03/04/20 169 lb 8 oz (76.9 kg)  02/15/19 173 lb 12 oz (78.8 kg)  09/18/18 177 lb 8 oz (80.5 kg)   BMI Readings from Last 3 Encounters:  04/15/20 31.51 kg/m  03/04/20 31.51 kg/m  02/15/19 32.04 kg/m    Assessment/Interventions: Review of patient past medical history, allergies, medications, health status, including review of consultants reports, laboratory and other test data, was performed as part of comprehensive evaluation and provision of chronic care management services.   SDOH:  (Social Determinants of Health) assessments and interventions performed: Yes SDOH Interventions   Flowsheet Row Most Recent Value  SDOH Interventions   Financial Strain Interventions Intervention Not Indicated  [Meds affordable]     SDOH Screenings   Alcohol Screen: Not on file  Depression (PHQ2-9): Low Risk   . PHQ-2 Score: 0  Financial Resource Strain: Low Risk   . Difficulty of Paying Living Expenses: Not very hard  Food Insecurity: Not on file  Housing: Not on file  Physical Activity: Not on file  Social Connections: Not on file  Stress: Not on file  Tobacco Use: Medium Risk  . Smoking Tobacco Use: Former Smoker  . Smokeless Tobacco Use: Never Used  Transportation Needs: Not on file    CCM Care Plan  Allergies  Allergen Reactions  . Meperidine Hcl Nausea And Vomiting    Medications Reviewed Today    Reviewed by Debbora Dus, [facility] (Pharmacist) on 07/30/20 at [place] List Status: <None>  Medication Order Taking? Sig Documenting Provider Last Dose Status Informant  amLODipine-valsartan (EXFORGE) 10-160 MG tablet [id number] Yes TAKE 1 TABLET BY MOUTH ONCE DAILY Jinny Sanders, MD Taking Active        Patient not taking:      Discontinued 07/30/20 1501 (Completed Course)        Patient not taking:      Discontinued 07/30/20 1501 (Completed Course)   furosemide (LASIX) 40 MG tablet [id number] Yes TAKE 1 TABLET BY MOUTH ONCE DAILY Bedsole, Amy E, MD Taking Active    HYDROcodone-acetaminophen (NORCO/VICODIN) 5-325 MG tablet [id number] Yes TAKE 1/2 TO 1 TABLET BY MOUTH EVERY 12 HOURS AS NEEDED FOR SEVERE PAIN. Copland, Frederico Hamman, MD Taking Active   levothyroxine (SYNTHROID) 100 MCG tablet [id number] Yes TAKE 1 TABLET BY MOUTH ONCE A DAY Bedsole, Amy E, MD Taking Active   metoprolol succinate (TOPROL-XL) 50 MG 24 hr tablet [id number] Yes TAKE 1 TABLET BY MOUTH ONCE DAILY WITH OR IMMEDIATELY FOLLOWING A MEAL Bedsole, Amy E, MD Taking Active   senna (SENOKOT) 8.6 MG TABS tablet [id number]  Take 1 tablet by mouth daily as needed for mild constipation. [provider]  Active   traZODone (DESYREL) 50 MG tablet [id number] Yes TAKE 1/2 TO 1 TABLET BY MOUTH AT BEDTIMEAS NEEDED FOR SLEEP Jinny Sanders, MD Taking Active           Patient Active Problem List   Diagnosis Date Noted  . Burning with urination 03/01/2018  . Sore throat 12/27/2017  . Iron deficiency anemia due to chronic blood loss 06/16/2017  . Lymphedema of right arm 11/17/2016  . Vaginal irritation 10/26/2016  . Right-sided low back pain without sciatica 08/22/2015  . Spinal stenosis in cervical region 08/07/2015  . Cervical spondylosis with myelopathy 06/26/2015  . Aortic valve calcification 06/03/2015  . BPV (benign positional vertigo) 01/29/2015  . Dizziness 08/27/2014  . Rash 01/03/2013  . Poor balance 10/07/2011  . Cough 04/28/2011  . History of right breast cancer 03/29/2011  . Vitamin D deficiency 10/17/2009  . Prediabetes 10/17/2009  . COPD (chronic obstructive pulmonary disease) ([place]) 12/19/2007  . INSOMNIA, CHRONIC 10/25/2007  . BACK PAIN, LUMBAR 03/01/2007  . Hypothyroidism 01/18/2007  . HYPONATREMIA, MILD 01/18/2007  . Essential hypertension 01/18/2007    Immunization History  Administered Date(s) Administered  . Fluad Quad(high Dose 65+) 01/19/2019  . Influenza Whole 01/18/2007, 02/15/2008, 01/28/2009, 01/27/2010, 01/27/2011  . Influenza, High Dose Seasonal PF  01/07/2017, 01/05/2020  . Influenza,inj,Quad PF,6+ Mos 12/29/2012, 01/10/2014, 01/28/2015, 01/28/2016, 01/03/2018  . PFIZER(Purple Top)SARS-COV-2 Vaccination 05/16/2019, 06/06/2019  . PPD Test 06/27/2015  . Pneumococcal Conjugate-13 01/10/2014  . Pneumococcal Polysaccharide-23 01/18/2007  . Td 02/22/2014  . Zoster 10/17/2009    Conditions to be addressed/monitored:  Hypertension and COPD  Care Plan : [place]  Updates made by Debbora Dus, [facility] since 07/30/2020 12:00 AM    Problem: CHL AMB "PATIENT-SPECIFIC PROBLEM"     Long-Range Goal: Diease Management   Start Date: 07/30/2020  Priority: High  Note:    Current Barriers:  . None identified  Pharmacist Clinical Goal(s):  Marland Kitchen Patient will contact provider office for questions/concerns as evidenced notation of same in electronic health record through collaboration with PharmD and provider.   Interventions: . 1:1 collaboration with Jinny Sanders, MD regarding development and update of comprehensive plan of care as evidenced by provider attestation and co-signature . Inter-disciplinary care team collaboration (see longitudinal plan of care) . Comprehensive medication review performed; medication list updated in electronic medical record  Hypertension (BP goal <140/90) -Controlled - per clinic and home BP -Current treatment:  Amlodipine-valsartan 10-160 mg - 1 tablet daily  Furosemide 40 mg - 1 tablet daily   Metoprolol succinate 50 mg - 1 tablet daily with meal -Medications previously tried: none reported -Current home readings: BP is consistently running 120s/60s. Checking often the last few weeks.   Wrist or arm cuff: Arm  Caffeine intake: Couple cups of coffee, and glass of tea during the day.   Salt intake: Does not add any additional salt to food.  OTC medications including pseudoephedrine or NSAIDs? No -Denies hypotensive/hypertensive symptoms -Educated on Symptoms of hypotension and importance of  maintaining adequate hydration; -Counseled to continue monitor BP at home periodically, document, and provide log at future appointments -Recommended to continue current medication  COPD (Goal: control symptoms and prevent exacerbations) -Controlled- per symptom report -Current treatment  . None -Medications previously tried:  None  -Pulmonary function testing: None per chart -Exacerbations requiring treatment in last 6 months: none -Frequency of rescue inhaler use: none -  does not need -Continue off inhalers  Fall Risk  (Goal: Prevent falls) -Son denies any recent falls  -Fall risk reviewed: Marland Kitchen Pt is not having hypotension . Pt is on hydrocodone and trazodone which may cause sedation/falls but denies daytime drowsiness. She takes a 1 hour nap after lunch. . Pt uses walker around home.  -Continue current medications; No medication changes recommended.  Patient Goals/Self-Care Activities . Patient will:  - take medications as prescribed  Follow Up Plan: Telephone follow up appointment with care management team member scheduled for: 12 months      Medication Assistance: None required.  Patient affirms current coverage meets needs.  Patient's preferred pharmacy is: [facility], [place] [address] Phone: [phone] Fax: [phone]  Pt is happy to take medications and fill her pillbox on her own. She calls in her own refills.  Uses pill box? Yes Pt endorses 100% compliance  Care Plan and Follow Up Patient Decision:  Patient agrees to Care Plan and Follow-up.  Debbora Dus, PharmD Clinical Pharmacist [facility] at [facility] [phone]

## 2020-07-28 NOTE — Telephone Encounter (Signed)
Please look into this.... I am ot sure what the problem is.  Sent in #[phone].

## 2020-07-28 NOTE — Telephone Encounter (Signed)
Spoke with Gerald Stabs at [employer].  They are back online so we can resend Rx now.

## 2020-07-28 NOTE — Addendum Note (Signed)
Addended by: Carter Kitten on: 07/28/2020 02:33 PM   Modules accepted: Orders

## 2020-07-28 NOTE — Telephone Encounter (Signed)
Bruce notified by telephone that refill has been sent in for his mom.

## 2020-07-30 NOTE — Patient Instructions (Signed)
Dear Diana [name],  Below is a summary of the goals we discussed during our follow up appointment on July 30, 2020. Please contact me anytime with questions or concerns.   Visit Information  Patient Care Plan: CCM Pharmacy Care Plan    Problem Identified: CHL AMB "PATIENT-SPECIFIC PROBLEM"     Long-Range Goal: Diease Management   Start Date: 07/30/2020  Priority: High  Note:    Current Barriers:  . None identified  Pharmacist Clinical Goal(s):  Marland Kitchen Patient will contact provider office for questions/concerns as evidenced notation of same in electronic health record through collaboration with PharmD and provider.   Interventions: . 1:1 collaboration with Jinny Sanders, MD regarding development and update of comprehensive plan of care as evidenced by provider attestation and co-signature . Inter-disciplinary care team collaboration (see longitudinal plan of care) . Comprehensive medication review performed; medication list updated in electronic medical record  Hypertension (BP goal <140/90) -Controlled - per clinic and home BP -Current treatment:  Amlodipine-valsartan 10-160 mg - 1 tablet daily  Furosemide 40 mg - 1 tablet daily   Metoprolol succinate 50 mg - 1 tablet daily with meal -Medications previously tried: none reported -Current home readings: BP is consistently running 120s/60s. Checking often the last few weeks.   Wrist or arm cuff: Arm  Caffeine intake: Couple cups of coffee, and glass of tea during the day.   Salt intake: Does not add any additional salt to food.  OTC medications including pseudoephedrine or NSAIDs? No -Denies hypotensive/hypertensive symptoms -Educated on Symptoms of hypotension and importance of maintaining adequate hydration; -Counseled to continue monitor BP at home periodically, document, and provide log at future appointments -Recommended to continue current medication  COPD (Goal: control symptoms and prevent  exacerbations) -Controlled- per symptom report -Current treatment  . None -Medications previously tried:  None  -Pulmonary function testing: None per chart -Exacerbations requiring treatment in last 6 months: none -Frequency of rescue inhaler use: none - does not need -Continue off inhalers  Fall Risk  (Goal: Prevent falls) -Son denies any recent falls  -Fall risk reviewed: Marland Kitchen Pt is not having hypotension . Pt is on hydrocodone and trazodone which may cause sedation/falls but denies daytime drowsiness. She takes a 1 hour nap after lunch. . Pt uses walker around home.  -Continue current medications; No medication changes recommended.  Patient Goals/Self-Care Activities . Patient will:  - take medications as prescribed  Follow Up Plan: Telephone follow up appointment with care management team member scheduled for: 12 months       The patient verbalized understanding of instructions, educational materials, and care plan provided today and declined offer to receive copy of patient instructions, educational materials, and care plan.   Debbora Dus, PharmD Clinical Pharmacist [facility] at [facility] [phone]

## 2020-07-31 NOTE — Progress Notes (Signed)
Encounter details: CCM Time Spent       Value Time User   Time spent with patient (minutes)  33 07/30/2020  3:11 PM Debbora Dus, [facility]   Time spent performing Chart review  30 07/30/2020  3:11 PM Debbora Dus, [facility]   Total time (minutes)  63 07/30/2020  3:11 PM Debbora Dus, RPH      Moderate to High Complex Decision Making       Value Time User   Moderate to High complex decision making  Yes 07/30/2020  3:11 PM Debbora Dus, [facility]      CCM Services: This encounter meets complex CCM services and moderate to high decision making.  Prior to outreach and patient consent for Chronic Care Management, I referred this patient for services after reviewing the nominated patient list or from a personal encounter with the patient.  I have personally reviewed this encounter including the documentation in this note and have collaborated with the care management provider regarding care management and care coordination activities to include development and update of the comprehensive care plan. I am certifying that I agree with the content of this note and encounter as supervising physician.

## 2020-08-05 ENCOUNTER — Ambulatory Visit (INDEPENDENT_AMBULATORY_CARE_PROVIDER_SITE_OTHER): Payer: Medicare Other | Admitting: Family Medicine

## 2020-08-05 ENCOUNTER — Encounter: Payer: Self-pay | Admitting: Family Medicine

## 2020-08-05 ENCOUNTER — Other Ambulatory Visit: Payer: Self-pay

## 2020-08-05 VITALS — BP 114/78 | HR 72 | Temp 97.9°F | Ht 61.5 in | Wt 166.5 lb

## 2020-08-05 DIAGNOSIS — R2689 Other abnormalities of gait and mobility: Secondary | ICD-10-CM | POA: Diagnosis not present

## 2020-08-05 DIAGNOSIS — M5442 Lumbago with sciatica, left side: Secondary | ICD-10-CM | POA: Diagnosis not present

## 2020-08-05 DIAGNOSIS — M4802 Spinal stenosis, cervical region: Secondary | ICD-10-CM | POA: Diagnosis not present

## 2020-08-05 DIAGNOSIS — M5441 Lumbago with sciatica, right side: Secondary | ICD-10-CM | POA: Diagnosis not present

## 2020-08-05 DIAGNOSIS — G8929 Other chronic pain: Secondary | ICD-10-CM

## 2020-08-05 MED ORDER — HYDROCODONE-ACETAMINOPHEN 5-325 MG PO TABS: ORAL_TABLET | ORAL | 0 refills | Status: DC

## 2020-08-05 NOTE — Progress Notes (Signed)
Patient ID: Diana [name], female    DOB: June 27, 1923, 87 y.o.   MRN: [id number]  This visit was conducted in person.  BP 114/78   Pulse 72   Temp 97.9 F (36.6 C) (Temporal)   Ht 5' 1.5" (1.562 m)   Wt 166 lb 8 oz (75.5 kg)   SpO2 97%   BMI 30.95 kg/m    CC:  Chief Complaint  Patient presents with  . Pain Management    Subjective:   HPI: Diana [name] is a 87 y.o. female presenting on 08/05/2020 for Pain Management   Indication for chronic opioid:   Chronic leg and low back pain Medication and dose: Hydrocodone 5/325 mg 1 tab po BID prn # pills per month:  60 Last UDS date:   Not indicated in this patient Opioid Treatment Agreement signed (Y/N):Y Opioid Treatment Agreement last reviewed with patient:   y. t NCCSRS reviewed this encounter (include red flags):     No constipation.  No falls, no oversedation.     Relevant past medical, surgical, family and social history reviewed and updated as indicated. Interim medical history since our last visit reviewed. Allergies and medications reviewed and updated. Outpatient Medications Prior to Visit  Medication Sig Dispense Refill  . amLODipine-valsartan (EXFORGE) 10-160 MG tablet TAKE 1 TABLET BY MOUTH ONCE DAILY 90 tablet 3  . furosemide (LASIX) 40 MG tablet TAKE 1 TABLET BY MOUTH ONCE DAILY 90 tablet 1  . HYDROcodone-acetaminophen (NORCO/VICODIN) 5-325 MG tablet TAKE 1/2 TO 1 TABLET BY MOUTH EVERY 12 HOURS AS NEEDED FOR SEVERE PAIN. 20 tablet 0  . levothyroxine (SYNTHROID) 100 MCG tablet TAKE 1 TABLET BY MOUTH ONCE A DAY 90 tablet 3  . metoprolol succinate (TOPROL-XL) 50 MG 24 hr tablet TAKE 1 TABLET BY MOUTH ONCE DAILY WITH OR IMMEDIATELY FOLLOWING A MEAL 90 tablet 1  . senna (SENOKOT) 8.6 MG TABS tablet Take 1 tablet by mouth daily as needed for mild constipation.    . traZODone (DESYREL) 50 MG tablet TAKE 1/2 TO 1 TABLET BY MOUTH AT BEDTIMEAS NEEDED FOR SLEEP 90 tablet 1   No facility-administered medications  prior to visit.     Per HPI unless specifically indicated in ROS section below Review of Systems  Constitutional: Negative for fatigue and fever.  HENT: Negative for ear pain.   Eyes: Negative for pain.  Respiratory: Negative for chest tightness and shortness of breath.   Cardiovascular: Negative for chest pain, palpitations and leg swelling.  Gastrointestinal: Negative for abdominal pain.  Genitourinary: Negative for dysuria.   Objective:  BP 114/78   Pulse 72   Temp 97.9 F (36.6 C) (Temporal)   Ht 5' 1.5" (1.562 m)   Wt 166 lb 8 oz (75.5 kg)   SpO2 97%   BMI 30.95 kg/m   Wt Readings from Last 3 Encounters:  08/05/20 166 lb 8 oz (75.5 kg)  03/04/20 169 lb 8 oz (76.9 kg)  02/15/19 173 lb 12 oz (78.8 kg)      Physical Exam Constitutional:      General: She is not in acute distress.    Appearance: Normal appearance. She is well-developed. She is obese. She is not ill-appearing or toxic-appearing.  HENT:     Head: Normocephalic.     Right Ear: Hearing, tympanic membrane, ear canal and external ear normal. Tympanic membrane is not erythematous, retracted or bulging.     Left Ear: Hearing, tympanic membrane, ear canal and external ear normal. Tympanic  membrane is not erythematous, retracted or bulging.     Nose: No mucosal edema or rhinorrhea.     Right Sinus: No maxillary sinus tenderness or frontal sinus tenderness.     Left Sinus: No maxillary sinus tenderness or frontal sinus tenderness.     Mouth/Throat:     Pharynx: Uvula midline.  Eyes:     General: Lids are normal. Lids are everted, no foreign bodies appreciated.     Conjunctiva/sclera: Conjunctivae normal.     Pupils: Pupils are equal, round, and reactive to light.  Neck:     Thyroid: No thyroid mass or thyromegaly.     Vascular: No carotid bruit.     Trachea: Trachea normal.  Cardiovascular:     Rate and Rhythm: Normal rate and regular rhythm.     Pulses: Normal pulses.     Heart sounds: Normal heart sounds,  S1 normal and S2 normal. No murmur heard. No friction rub. No gallop.   Pulmonary:     Effort: Pulmonary effort is normal. No tachypnea or respiratory distress.     Breath sounds: Normal breath sounds. No decreased breath sounds, wheezing, rhonchi or rales.  Abdominal:     General: Bowel sounds are normal.     Palpations: Abdomen is soft.     Tenderness: There is no abdominal tenderness.  Musculoskeletal:     Cervical back: Neck supple. Tenderness present. Decreased range of motion.     Lumbar back: Tenderness present. Decreased range of motion.  Skin:    General: Skin is warm and dry.     Findings: No rash.  Neurological:     Mental Status: She is alert.  Psychiatric:        Mood and Affect: Mood is not anxious or depressed.        Speech: Speech normal.        Behavior: Behavior normal. Behavior is cooperative.        Thought Content: Thought content normal.        Judgment: Judgment normal.       Results for orders placed or performed in visit on 04/15/20  Novel Coronavirus, NAA ([facility])   Specimen: Nasopharyngeal(NP) swabs in vial transport medium   Nasopharynge  Result Value Ref Range   SARS-CoV-2, NAA Not Detected Not Detected  SARS-COV-2, NAA 2 DAY TAT   Nasopharynge  Result Value Ref Range   SARS-CoV-2, NAA 2 DAY TAT Performed     This visit occurred during the SARS-CoV-2 public health emergency.  Safety protocols were in place, including screening questions prior to the visit, additional usage of staff PPE, and extensive cleaning of exam room while observing appropriate contact time as indicated for disinfecting solutions.   COVID 19 screen:  No recent travel or known exposure to COVID19 The patient denies respiratory symptoms of COVID 19 at this time. The importance of social distancing was discussed today.   Assessment and Plan Problem List Items Addressed This Visit    BACK PAIN, LUMBAR - Primary   Relevant Medications   HYDROcodone-acetaminophen  (NORCO/VICODIN) 5-325 MG tablet   Poor balance   Spinal stenosis in cervical region      PDMP reviewed, no red flags. Contniue use of heat, gentle stertching and hydrocodone 1/2-1 tab BID prn.  Meds ordered this encounter  Medications  . DISCONTD: HYDROcodone-acetaminophen (NORCO/VICODIN) 5-325 MG tablet    Sig: TAKE 1/2 TO 1 TABLET BY MOUTH EVERY 12 HOURS AS NEEDED FOR SEVERE PAIN.    Dispense:  60  tablet    Refill:  0    Fill 08/07/2020  . DISCONTD: HYDROcodone-acetaminophen (NORCO/VICODIN) 5-325 MG tablet    Sig: TAKE 1/2 TO 1 TABLET BY MOUTH EVERY 12 HOURS AS NEEDED FOR SEVERE PAIN.    Dispense:  60 tablet    Refill:  0    Fill 09/06/2020  . HYDROcodone-acetaminophen (NORCO/VICODIN) 5-325 MG tablet    Sig: TAKE 1/2 TO 1 TABLET BY MOUTH EVERY 12 HOURS AS NEEDED FOR SEVERE PAIN.    Dispense:  60 tablet    Refill:  0    Fill 10/07/2020     Eliezer Lofts, MD

## 2020-09-30 DIAGNOSIS — H903 Sensorineural hearing loss, bilateral: Secondary | ICD-10-CM | POA: Diagnosis not present

## 2020-09-30 DIAGNOSIS — H9319 Tinnitus, unspecified ear: Secondary | ICD-10-CM | POA: Diagnosis not present

## 2020-10-07 ENCOUNTER — Other Ambulatory Visit: Payer: Self-pay | Admitting: Family Medicine

## 2020-11-07 ENCOUNTER — Ambulatory Visit: Payer: Medicare Other | Admitting: Family Medicine

## 2020-12-17 ENCOUNTER — Other Ambulatory Visit: Payer: Self-pay | Admitting: Family Medicine

## 2020-12-27 ENCOUNTER — Other Ambulatory Visit: Payer: Self-pay | Admitting: Family Medicine

## 2021-02-17 DIAGNOSIS — H26493 Other secondary cataract, bilateral: Secondary | ICD-10-CM | POA: Diagnosis not present

## 2021-02-26 ENCOUNTER — Telehealth: Payer: Self-pay | Admitting: Family Medicine

## 2021-02-26 NOTE — Telephone Encounter (Signed)
Please schedule Medicare Wellness Visit with nurse and a 40 minute CPE with Dr. Diona Browner.

## 2021-02-26 NOTE — Telephone Encounter (Signed)
Called patient and spoke to her son and got her scheduled for 05/08/21

## 2021-03-06 DIAGNOSIS — S20212A Contusion of left front wall of thorax, initial encounter: Secondary | ICD-10-CM | POA: Diagnosis not present

## 2021-03-23 ENCOUNTER — Other Ambulatory Visit: Payer: Self-pay | Admitting: Family Medicine

## 2021-04-09 ENCOUNTER — Other Ambulatory Visit: Payer: Self-pay | Admitting: Family Medicine

## 2021-04-23 ENCOUNTER — Other Ambulatory Visit: Payer: Self-pay | Admitting: Family Medicine

## 2021-05-05 NOTE — Progress Notes (Signed)
Subjective:   Diana [name] is a 88 y.o. female who presents for Medicare Annual (Subsequent) preventive examination.  I connected with Diana [name] today by telephone and verified that I am speaking with the correct person using two identifiers. Location patient: home Location provider: work Persons participating in the virtual visit: patient, Marine scientist.    I discussed the limitations, risks, security and privacy concerns of performing an evaluation and management service by telephone and the availability of in person appointments. I also discussed with the patient that there may be a patient responsible charge related to this service. The patient expressed understanding and verbally consented to this telephonic visit.    Interactive audio and video telecommunications were attempted between this provider and patient, however failed, due to patient having technical difficulties OR patient did not have access to video capability.  We continued and completed visit with audio only.  Some vital signs may be absent or patient reported.   Time Spent with patient on telephone encounter: 30 minutes  Review of Systems     Cardiac Risk Factors include: advanced age (>33men, >62 women);hypertension     Objective:    Today's Vitals   05/06/21 1406 05/06/21 1407  Weight: 160 lb (72.6 kg)   Height: 5\' 1"  (1.549 m)   PainSc:  5    Body mass index is 30.23 kg/m.  Advanced Directives 05/06/2021 02/02/2017 01/10/2017 05/08/2016 04/23/2016 07/14/2015 07/03/2015  Does Patient Have a Medical Advance Directive? Yes Yes Yes Yes Yes Yes No  Type of Paramedic of [place];Living will [place];Living will [place];Living will Healthcare Power of [place];Living will (No Data) -  Does patient want to make changes to medical advance directive? Yes (MAU/Ambulatory/Procedural Areas - Information given) - - - - - -  Copy  of Healthcare Power of Attorney in Chart? - No - copy requested No - copy requested - No - copy requested - -  Would patient like information on creating a medical advance directive? - - - - - - No - patient declined information    Current Medications (verified) Outpatient Encounter Medications as of 05/06/2021  Medication Sig   amLODipine-valsartan (EXFORGE) 10-160 MG tablet TAKE 1 TABLET BY MOUTH ONCE DAILY   furosemide (LASIX) 40 MG tablet TAKE 1 TABLET BY MOUTH ONCE DAILY   HYDROcodone-acetaminophen (NORCO/VICODIN) 5-325 MG tablet TAKE 1/2 TO 1 TABLET BY MOUTH EVERY 12 HOURS AS NEEDED FOR SEVERE PAIN.   levothyroxine (SYNTHROID) 100 MCG tablet TAKE 1 TABLET BY MOUTH ONCE A DAY   metoprolol succinate (TOPROL-XL) 50 MG 24 hr tablet TAKE 1 TABLET BY MOUTH ONCE DAILY WITH OR IMMEDIATELY FOLLOWING A MEAL   senna (SENOKOT) 8.6 MG TABS tablet Take 1 tablet by mouth daily as needed for mild constipation.   traZODone (DESYREL) 50 MG tablet TAKE 1/2 TO 1 TABLET BY MOUTH AT [facility] NEEDED FOR SLEEP   No facility-administered encounter medications on file as of 05/06/2021.    Allergies (verified) Meperidine hcl   History: Past Medical History:  Diagnosis Date   Arthritis    Bacterial pneumonia, unspecified    Cancer , breast    Candidiasis of mouth    Chronic airway obstruction, not elsewhere classified    son not aware of this   Encephalitis 1961   Family history of adverse reaction to anesthesia    son has nausea   Heart murmur    has had it for years;  very mild AS 05/2015 echo   Hyposmolality and/or hyponatremia    Lumbago    Obstructive chronic bronchitis with exacerbation (HCC)    Other abnormal glucose    Pain in joint, shoulder region    Palpitations    Persistent disorder of initiating or maintaining sleep    Restless leg syndrome    Unspecified essential hypertension    Unspecified hypothyroidism    Unspecified viral infection, in conditions classified elsewhere and of  unspecified site    Unspecified vitamin D deficiency    Urinary tract infection, site not specified    Past Surgical History:  Procedure Laterality Date   ABI  04/19/05   Negative   Back MRI  2005   mild bulging disks   EYE SURGERY Bilateral    Cataract surgery    HEMORRHOID SURGERY     MASTECTOMY, RADICAL     POSTERIOR CERVICAL LAMINECTOMY N/A 06/26/2015   Procedure: Cervical three-four POSTERIOR CERVICAL LAMINECTOMY for decompression ;  Surgeon: Kevan Ny Ditty, MD;  Location: [facility];  Service: Neurosurgery;  Laterality: N/A;  C34 laminectomy   Venous doppler  04/19/05   LE normal   Family History  Problem Relation Age of Onset   Rheum arthritis Father    Stroke Mother    Diabetes Mother    Healthy Sister    Healthy Sister    Liver cancer Unknown        Aunt   Stomach cancer Unknown        Aunt   Social History   Socioeconomic History   Marital status: Widowed    Spouse name: Not on file   Number of children: Not on file   Years of education: Not on file   Highest education level: Not on file  Occupational History   Not on file  Tobacco Use   Smoking status: Former    Packs/day: 0.50    Years: 42.00    Pack years: 21.00    Types: Cigarettes   Smokeless tobacco: Never  Substance and Sexual Activity   Alcohol use: No   Drug use: No   Sexual activity: Not on file  Other Topics Concern   Not on file  Social History Narrative   Married x60 years      No regular exercise-uses cane to ambulate, unsteady on feet      Diet: Moderately healthy, a lot of desserts       Living will, HCPOA: son: BRIGET SHAHEED   Social Determinants of Health   Financial Resource Strain: Low Risk    Difficulty of Paying Living Expenses: Not hard at all  Food Insecurity: No Food Insecurity   Worried About Charity fundraiser in the Last Year: Never true   [place] in the Last Year: Never true  Transportation Needs: No Transportation Needs   Lack of Transportation  (Medical): No   Lack of Transportation (Non-Medical): No  Physical Activity: Inactive   Days of Exercise per Week: 0 days   Minutes of Exercise per Session: 0 min  Stress: No Stress Concern Present   Feeling of Stress : Not at all  Social Connections: Socially Isolated   Frequency of Communication with Friends and Family: More than three times a week   Frequency of Social Gatherings with Friends and Family: More than three times a week   Attends Religious Services: Never   Marine scientist or Organizations: No   Attends Archivist Meetings:  Never   Marital Status: Widowed    Tobacco Counseling Counseling given: Not Answered   Clinical Intake:  Pre-visit preparation completed: Yes  Pain : 0-10 (arthritis) Pain Score: 5      BMI - recorded: 30.23 Nutritional Status: BMI > 30  Obese Nutritional Risks: None Diabetes: No  How often do you need to have someone help you when you read instructions, pamphlets, or other written materials from your doctor or pharmacy?: 1 - Never  Diabetic? No  Interpreter Needed?: No  Information entered by :: Orrin Brigham LPN   Activities of Daily Living In your present state of health, do you have any difficulty performing the following activities: 05/06/2021  Hearing? Y  Vision? Y  Difficulty concentrating or making decisions? N  Walking or climbing stairs? Y  Dressing or bathing? N  Doing errands, shopping? Y  Preparing Food and eating ? N  Using the Toilet? N  In the past six months, have you accidently leaked urine? N  Do you have problems with loss of bowel control? N  Managing your Medications? N  Managing your Finances? N  Housekeeping or managing your Housekeeping? Y  Some recent data might be hidden    Patient Care Team: Jinny Sanders, MD as PCP - General Debbora Dus, [facility] as Pharmacist (Pharmacist)  Indicate any recent Medical Services you may have received from other than [facility] providers in the  past year (date may be approximate).     Assessment:   This is a routine wellness examination for Denzil.  Hearing/Vision screen Hearing Screening - Comments:: Decrease in hearing  Vision Screening - Comments:: Last exam 01/2021, Dr. Kathrin Penner  Dietary issues and exercise activities discussed: Current Exercise Habits: The patient does not participate in regular exercise at present   Goals Addressed             This Visit's Progress    Patient Stated       Would like to maintain current routine       Depression Screen PHQ 2/9 Scores 05/06/2021 03/04/2020 02/15/2019 01/03/2018 04/23/2016 01/10/2014 12/29/2012  PHQ - 2 Score 1 0 0 1 0 0 0  PHQ- 9 Score - - - 4 - - -    Fall Risk Fall Risk  05/06/2021 03/04/2020 02/15/2019 01/03/2018 04/23/2016  Falls in the past year? 1 0 0 No No  Number falls in past yr: 0 - - - -  Injury with Fall? 1 - - - -  Comment broke ribs - - - -  Risk for fall due to : Impaired balance/gait - - - -  Risk for fall due to: Comment - - - - -  Follow up Falls prevention discussed - - - -    FALL RISK PREVENTION PERTAINING TO THE HOME:  Any stairs in or around the home? No  If so, are there any without handrails? No  Home free of loose throw rugs in walkways, pet beds, electrical cords, etc? Yes  Adequate lighting in your home to reduce risk of falls? Yes   ASSISTIVE DEVICES UTILIZED TO PREVENT FALLS:  Life alert? Yes  Use of a cane, walker or w/c? Yes , walker Grab bars in the bathroom? No  Shower chair or bench in shower? No  Elevated toilet seat or a handicapped toilet? No   TIMED UP AND GO:  Was the test performed? No .    Cognitive Function:  Normal cognitive status assessed by  this Nurse Health Advisor. No  abnormalities found.        Immunizations Immunization History  Administered Date(s) Administered   Fluad Quad(high Dose 65+) 01/19/2019   Influenza Whole 01/18/2007, 02/15/2008, 01/28/2009, 01/27/2010, 01/27/2011    Influenza, High Dose Seasonal PF 01/07/2017, 01/05/2020   Influenza,inj,Quad PF,6+ Mos 12/29/2012, 01/10/2014, 01/28/2015, 01/28/2016, 01/03/2018   PFIZER(Purple Top)SARS-COV-2 Vaccination 05/16/2019, 06/06/2019   PPD Test 06/27/2015   Pneumococcal Conjugate-13 01/10/2014   Pneumococcal Polysaccharide-23 01/18/2007   Td 02/22/2014   Zoster, Live 10/17/2009    TDAP status: Up to date  Flu Vaccine status: Up to date  Pneumococcal vaccine status: Up to date  Covid-19 vaccine status: Information provided on how to obtain vaccines.   Qualifies for Shingles Vaccine? Yes   Zostavax completed Yes   Shingrix Completed?: No.    Education has been provided regarding the importance of this vaccine. Patient has been advised to call insurance company to determine out of pocket expense if they have not yet received this vaccine. Advised may also receive vaccine at local pharmacy or Health Dept. Verbalized acceptance and understanding.  Screening Tests Health Maintenance  Topic Date Due   Zoster Vaccines- Shingrix (1 of 2) Never done   DEXA SCAN  Never done   COVID-19 Vaccine (3 - Pfizer risk series) 07/04/2019   INFLUENZA VACCINE  11/17/2020   TETANUS/TDAP  02/23/2024   Pneumonia Vaccine 75+ Years old  Completed   HPV VACCINES  Aged Out    Health Maintenance  Health Maintenance Due  Topic Date Due   Zoster Vaccines- Shingrix (1 of 2) Never done   DEXA SCAN  Never done   COVID-19 Vaccine (3 - Pfizer risk series) 07/04/2019   INFLUENZA VACCINE  11/17/2020    Colorectal cancer screening: No longer required.   Mammogram status: No longer required due to age.  Bone Density status: No longer required   Lung Cancer Screening: (Low Dose CT Chest recommended if Age 59-80 years, 30 pack-year currently smoking OR have quit w/in 15years.) does not qualify.     Additional Screening:  Hepatitis C Screening: does not qualify  Vision Screening: Recommended annual ophthalmology exams for  early detection of glaucoma and other disorders of the eye. Is the patient up to date with their annual eye exam?  Yes  Who is the provider or what is the name of the office in which the patient attends annual eye exams? Dr. Kathrin Penner   Dental Screening: Recommended annual dental exams for proper oral hygiene  Community Resource Referral / Chronic Care Management: CRR required this visit?  No   CCM required this visit?  No      Plan:     I have personally reviewed and noted the following in the patients chart:   Medical and social history Use of alcohol, tobacco or illicit drugs  Current medications and supplements including opioid prescriptions.  Functional ability and status Nutritional status Physical activity Advanced directives List of other physicians Hospitalizations, surgeries, and ER visits in previous 12 months Vitals Screenings to include cognitive, depression, and falls Referrals and appointments  In addition, I have reviewed and discussed with patient certain preventive protocols, quality metrics, and best practice recommendations. A written personalized care plan for preventive services as well as general preventive health recommendations were provided to patient.    Due to this being a telephonic visit, the after visit summary with patients personalized plan was offered to patient via mail or my-chart. Patient preferred to pick up at office at next visit.  Loma Messing, LPN   [id number]   Nurse Health Advisor  Nurse Notes: none

## 2021-05-06 ENCOUNTER — Ambulatory Visit (INDEPENDENT_AMBULATORY_CARE_PROVIDER_SITE_OTHER): Payer: Medicare Other

## 2021-05-06 VITALS — Ht 61.0 in | Wt 160.0 lb

## 2021-05-06 DIAGNOSIS — Z0000 Encounter for general adult medical examination without abnormal findings: Secondary | ICD-10-CM

## 2021-05-06 NOTE — Patient Instructions (Signed)
Diana [name] , Thank you for taking time to complete your Medicare Wellness Visit. I appreciate your ongoing commitment to your health goals. Please review the following plan we discussed and let me know if I can assist you in the future.   Screening recommendations/referrals: Colonoscopy: no longer required  Mammogram: due, last completed 02/04/11, discuss with PCP if you change your mind Bone Density: no longer required  Recommended yearly ophthalmology/optometry visit for glaucoma screening and checkup Recommended yearly dental visit for hygiene and checkup  Vaccinations: Influenza vaccine: up to date, please provide vaccine information when available Pneumococcal vaccine: up to date Tdap vaccine: up to date Shingles vaccine: Discuss with your local pharmacy   Covid-19:newest booster available at your local pharmacy  Advanced directives: Please bring a copy of Living Will and/or Healthcare Power of Attorney for your chart.   Conditions/risks identified: see problem list  Next appointment: Follow up in one year for your annual wellness visit    Preventive Care 65 Years and Older, Female Preventive care refers to lifestyle choices and visits with your health care provider that can promote health and wellness. What does preventive care include? A yearly physical exam. This is also called an annual well check. Dental exams once or twice a year. Routine eye exams. Ask your health care provider how often you should have your eyes checked. Personal lifestyle choices, including: Daily care of your teeth and gums. Regular physical activity. Eating a healthy diet. Avoiding tobacco and drug use. Limiting alcohol use. Practicing safe sex. Taking low-dose aspirin every day. Taking vitamin and mineral supplements as recommended by your health care provider. What happens during an annual well check? The services and screenings done by your health care provider during your annual well check  will depend on your age, overall health, lifestyle risk factors, and family history of disease. Counseling  Your health care provider may ask you questions about your: Alcohol use. Tobacco use. Drug use. Emotional well-being. Home and relationship well-being. Sexual activity. Eating habits. History of falls. Memory and ability to understand (cognition). Work and work Statistician. Reproductive health. Screening  You may have the following tests or measurements: Height, weight, and BMI. Blood pressure. Lipid and cholesterol levels. These may be checked every 5 years, or more frequently if you are over 17 years old. Skin check. Lung cancer screening. You may have this screening every year starting at age 16 if you have a 30-pack-year history of smoking and currently smoke or have quit within the past 15 years. Fecal occult blood test (FOBT) of the stool. You may have this test every year starting at age 47. Flexible sigmoidoscopy or colonoscopy. You may have a sigmoidoscopy every 5 years or a colonoscopy every 10 years starting at age 50. Hepatitis C blood test. Hepatitis B blood test. Sexually transmitted disease (STD) testing. Diabetes screening. This is done by checking your blood sugar (glucose) after you have not eaten for a while (fasting). You may have this done every 1-3 years. Bone density scan. This is done to screen for osteoporosis. You may have this done starting at age 46. Mammogram. This may be done every 1-2 years. Talk to your health care provider about how often you should have regular mammograms. Talk with your health care provider about your test results, treatment options, and if necessary, the need for more tests. Vaccines  Your health care provider may recommend certain vaccines, such as: Influenza vaccine. This is recommended every year. Tetanus, diphtheria, and acellular pertussis (Tdap,  Td) vaccine. You may need a Td booster every 10 years. Zoster vaccine. You  may need this after age 61. Pneumococcal 13-valent conjugate (PCV13) vaccine. One dose is recommended after age 29. Pneumococcal polysaccharide (PPSV23) vaccine. One dose is recommended after age 29. Talk to your health care provider about which screenings and vaccines you need and how often you need them. This information is not intended to replace advice given to you by your health care provider. Make sure you discuss any questions you have with your health care provider. Document Released: 05/02/2015 Document Revised: 12/24/2015 Document Reviewed: 02/04/2015 Elsevier Interactive Patient Education  2017 [employer] Prevention in the Home Falls can cause injuries. They can happen to people of all ages. There are many things you can do to make your home safe and to help prevent falls. What can I do on the outside of my home? Regularly fix the edges of walkways and driveways and fix any cracks. Remove anything that might make you trip as you walk through a door, such as a raised step or threshold. Trim any bushes or trees on the path to your home. Use bright outdoor lighting. Clear any walking paths of anything that might make someone trip, such as rocks or tools. Regularly check to see if handrails are loose or broken. Make sure that both sides of any steps have handrails. Any raised decks and porches should have guardrails on the edges. Have any leaves, snow, or ice cleared regularly. Use sand or salt on walking paths during winter. Clean up any spills in your garage right away. This includes oil or grease spills. What can I do in the bathroom? Use night lights. Install grab bars by the toilet and in the tub and shower. Do not use towel bars as grab bars. Use non-skid mats or decals in the tub or shower. If you need to sit down in the shower, use a plastic, non-slip stool. Keep the floor dry. Clean up any water that spills on the floor as soon as it happens. Remove soap buildup  in the tub or shower regularly. Attach bath mats securely with double-sided non-slip rug tape. Do not have throw rugs and other things on the floor that can make you trip. What can I do in the bedroom? Use night lights. Make sure that you have a light by your bed that is easy to reach. Do not use any sheets or blankets that are too big for your bed. They should not hang down onto the floor. Have a firm chair that has side arms. You can use this for support while you get dressed. Do not have throw rugs and other things on the floor that can make you trip. What can I do in the kitchen? Clean up any spills right away. Avoid walking on wet floors. Keep items that you use a lot in easy-to-reach places. If you need to reach something above you, use a strong step stool that has a grab bar. Keep electrical cords out of the way. Do not use floor polish or wax that makes floors slippery. If you must use wax, use non-skid floor wax. Do not have throw rugs and other things on the floor that can make you trip. What can I do with my stairs? Do not leave any items on the stairs. Make sure that there are handrails on both sides of the stairs and use them. Fix handrails that are broken or loose. Make sure that handrails are as  long as the stairways. Check any carpeting to make sure that it is firmly attached to the stairs. Fix any carpet that is loose or worn. Avoid having throw rugs at the top or bottom of the stairs. If you do have throw rugs, attach them to the floor with carpet tape. Make sure that you have a light switch at the top of the stairs and the bottom of the stairs. If you do not have them, ask someone to add them for you. What else can I do to help prevent falls? Wear shoes that: Do not have high heels. Have rubber bottoms. Are comfortable and fit you well. Are closed at the toe. Do not wear sandals. If you use a stepladder: Make sure that it is fully opened. Do not climb a closed  stepladder. Make sure that both sides of the stepladder are locked into place. Ask someone to hold it for you, if possible. Clearly mark and make sure that you can see: Any grab bars or handrails. First and last steps. Where the edge of each step is. Use tools that help you move around (mobility aids) if they are needed. These include: Canes. Walkers. Scooters. Crutches. Turn on the lights when you go into a dark area. Replace any light bulbs as soon as they burn out. Set up your furniture so you have a clear path. Avoid moving your furniture around. If any of your floors are uneven, fix them. If there are any pets around you, be aware of where they are. Review your medicines with your doctor. Some medicines can make you feel dizzy. This can increase your chance of falling. Ask your doctor what other things that you can do to help prevent falls. This information is not intended to replace advice given to you by your health care provider. Make sure you discuss any questions you have with your health care provider. Document Released: 01/30/2009 Document Revised: 09/11/2015 Document Reviewed: 05/10/2014 Elsevier Interactive Patient Education  2017 [employer].

## 2021-05-08 ENCOUNTER — Encounter: Payer: Medicare Other | Admitting: Family Medicine

## 2021-05-22 ENCOUNTER — Other Ambulatory Visit: Payer: Self-pay | Admitting: Family Medicine

## 2021-06-23 ENCOUNTER — Other Ambulatory Visit: Payer: Self-pay | Admitting: Family Medicine

## 2021-06-26 ENCOUNTER — Ambulatory Visit (INDEPENDENT_AMBULATORY_CARE_PROVIDER_SITE_OTHER): Payer: Medicare Other | Admitting: Family Medicine

## 2021-06-26 ENCOUNTER — Encounter: Payer: Self-pay | Admitting: Family Medicine

## 2021-06-26 ENCOUNTER — Other Ambulatory Visit: Payer: Self-pay

## 2021-06-26 VITALS — BP 118/76 | HR 88 | Ht 61.0 in | Wt 163.0 lb

## 2021-06-26 DIAGNOSIS — I10 Essential (primary) hypertension: Secondary | ICD-10-CM

## 2021-06-26 DIAGNOSIS — M4802 Spinal stenosis, cervical region: Secondary | ICD-10-CM

## 2021-06-26 DIAGNOSIS — E039 Hypothyroidism, unspecified: Secondary | ICD-10-CM

## 2021-06-26 DIAGNOSIS — E782 Mixed hyperlipidemia: Secondary | ICD-10-CM | POA: Diagnosis not present

## 2021-06-26 DIAGNOSIS — Z0000 Encounter for general adult medical examination without abnormal findings: Secondary | ICD-10-CM | POA: Diagnosis not present

## 2021-06-26 DIAGNOSIS — R7303 Prediabetes: Secondary | ICD-10-CM | POA: Diagnosis not present

## 2021-06-26 DIAGNOSIS — J449 Chronic obstructive pulmonary disease, unspecified: Secondary | ICD-10-CM

## 2021-06-26 DIAGNOSIS — E559 Vitamin D deficiency, unspecified: Secondary | ICD-10-CM | POA: Diagnosis not present

## 2021-06-26 DIAGNOSIS — M5441 Lumbago with sciatica, right side: Secondary | ICD-10-CM

## 2021-06-26 DIAGNOSIS — G8929 Other chronic pain: Secondary | ICD-10-CM

## 2021-06-26 DIAGNOSIS — M5442 Lumbago with sciatica, left side: Secondary | ICD-10-CM

## 2021-06-26 MED ORDER — HYDROCODONE-ACETAMINOPHEN 5-325 MG PO TABS: ORAL_TABLET | ORAL | 0 refills | Status: DC

## 2021-06-26 NOTE — Patient Instructions (Addendum)
Please stop at the lab to have labs drawn. ? Can try hydrocodone APAP 1-2 tabs daily as needed for pain. ? Consider shingles vaccine at pharmacy. ? ?

## 2021-06-26 NOTE — Progress Notes (Unsigned)
Patient ID: Diana [name], female    DOB: 1923/08/16, 88 y.o.   MRN: [id number]  This visit was conducted in person.  BP 118/76    Pulse 88    Ht '5\' 1"'$  (1.549 m)    Wt 163 lb (73.9 kg)    SpO2 97%    BMI 30.80 kg/m    CC:  Chief Complaint  Patient presents with   Annual Exam    Physical exam and labs     Subjective:   HPI: Diana [name] is a 88 y.o. female presenting on 06/26/2021 for Annual Exam (Physical exam and labs )  The patient presents for annual medicare wellness, complete physical and review of chronic health problems. He/She also has the following acute concerns today:  The patient saw a LPN or RN for medicare wellness visit.  Prevention and wellness was reviewed in detail. Note reviewed and important notes copied below.  Hypertension:  At goal on amlodipine valsartan, metoprolol.   BP Readings from Last 3 Encounters:  06/26/21 118/76  08/05/20 114/78  04/15/20 119/66  Using medication without problems or lightheadedness:  occ  BP normal even if feeling dizzy  Feeling like falling backwards frequently, no clear room spinning. Chest pain with exertion:  occ at rest Edema:none Short of breath: none Average home BPs: Other issues:   Indication for chronic opioid:   Chronic leg and low back pain, essential wheelchair bound. Medication and dose: Hydrocodone 5/325 mg 1 tab po daily prn.. does not seem to help much more than tylenol on lower dose. # pills per month:  60 Last UDS date:   Not indicated in this patient Opioid Treatment Agreement signed (Y/N):Y Opioid Treatment Agreement last reviewed with patient:   y. t NCCSRS reviewed this encounter (include red flags):        COPD: stable control, no inhaler or oxygen requirement   Prediabetes due for re-eval.  Hypothyroid: due for re-eval.  Relevant past medical, surgical, family and social history reviewed and updated as indicated. Interim medical history since our last visit reviewed. Allergies and  medications reviewed and updated. Outpatient Medications Prior to Visit  Medication Sig Dispense Refill   amLODipine-valsartan (EXFORGE) 10-160 MG tablet TAKE 1 TABLET BY MOUTH ONCE DAILY 90 tablet 0   furosemide (LASIX) 40 MG tablet TAKE 1 TABLET BY MOUTH ONCE DAILY 90 tablet 0   HYDROcodone-acetaminophen (NORCO/VICODIN) 5-325 MG tablet TAKE 1/2 TO 1 TABLET BY MOUTH EVERY 12 HOURS AS NEEDED FOR SEVERE PAIN. 60 tablet 0   levothyroxine (SYNTHROID) 100 MCG tablet TAKE 1 TABLET BY MOUTH ONCE A DAY 90 tablet 0   metoprolol succinate (TOPROL-XL) 50 MG 24 hr tablet TAKE 1 TABLET BY MOUTH ONCE DAILY WITH OR IMMEDIATELY FOLLOWING A MEAL 90 tablet 0   senna (SENOKOT) 8.6 MG TABS tablet Take 1 tablet by mouth daily as needed for mild constipation.     traZODone (DESYREL) 50 MG tablet TAKE 1/2 TO 1 TABLET BY MOUTH AT BEDTIMEAS NEEDED FOR SLEEP 90 tablet 0   No facility-administered medications prior to visit.     Per HPI unless specifically indicated in ROS section below Review of Systems Objective:  BP 118/76    Pulse 88    Ht '5\' 1"'$  (1.549 m)    Wt 163 lb (73.9 kg)    SpO2 97%    BMI 30.80 kg/m   Wt Readings from Last 3 Encounters:  06/26/21 163 lb (73.9 kg)  05/06/21 160 lb (72.6  kg)  08/05/20 166 lb 8 oz (75.5 kg)      Physical Exam    Results for orders placed or performed in visit on 04/15/20  Novel Coronavirus, NAA ([facility])   Specimen: Nasopharyngeal(NP) swabs in vial transport medium   Nasopharynge  Result Value Ref Range   SARS-CoV-2, NAA Not Detected Not Detected  SARS-COV-2, NAA 2 DAY TAT   Nasopharynge  Result Value Ref Range   SARS-CoV-2, NAA 2 DAY TAT Performed     This visit occurred during the SARS-CoV-2 public health emergency.  Safety protocols were in place, including screening questions prior to the visit, additional usage of staff PPE, and extensive cleaning of exam room while observing appropriate contact time as indicated for disinfecting solutions.   COVID 19  screen:  No recent travel or known exposure to COVID19 The patient denies respiratory symptoms of COVID 19 at this time. The importance of social distancing was discussed today.   Assessment and Plan   The patient's preventative maintenance and recommended screening tests for an annual wellness exam were reviewed in full today. Brought up to date unless services declined.  Counselled on the importance of diet, exercise, and its role in overall health and mortality. The patient's FH and SH was reviewed, including their home life, tobacco status, and drug and alcohol status.   Vaccines: Uptodate with shingles and pneumonia vaccines, td, COVID x2 .. per pt got flu shot. Mammo: Nml 01/2011.Marland Kitchen Hx of breast cancer 1986.Marland Kitchen  She is not interested in further mammograms and breast exams given she would not do aggressive treatment. Colon: last 2003, no further indicated, no family history  DVE/pap: not indicated DXA: sister nml, no family history... Has never had. She refuses this at this time, again refused 2020, 2023  Decreased hearing, tinnitus  Eliezer Lofts, MD

## 2021-06-27 LAB — CBC WITH DIFFERENTIAL/PLATELET
Absolute Monocytes: 721 cells/uL (ref 200–950)
Basophils Absolute: 28 cells/uL (ref 0–200)
Basophils Relative: 0.5 %
Eosinophils Absolute: 61 cells/uL (ref 15–500)
Eosinophils Relative: 1.1 %
HCT: 39.6 % (ref 35.0–45.0)
Hemoglobin: 13.5 g/dL (ref 11.7–15.5)
Lymphs Abs: 1381 cells/uL (ref 850–3900)
MCH: 31.9 pg (ref 27.0–33.0)
MCHC: 34.1 g/dL (ref 32.0–36.0)
MCV: 93.6 fL (ref 80.0–100.0)
MPV: 11.8 fL (ref 7.5–12.5)
Monocytes Relative: 13.1 %
Neutro Abs: 3311 cells/uL (ref 1500–7800)
Neutrophils Relative %: 60.2 %
Platelets: 159 10*3/uL (ref 140–400)
RBC: 4.23 10*6/uL (ref 3.80–5.10)
RDW: 12.9 % (ref 11.0–15.0)
Total Lymphocyte: 25.1 %
WBC: 5.5 10*3/uL (ref 3.8–10.8)

## 2021-06-27 LAB — COMPREHENSIVE METABOLIC PANEL
AG Ratio: 1.4 (calc) (ref 1.0–2.5)
ALT: 11 U/L (ref 6–29)
AST: 16 U/L (ref 10–35)
Albumin: 4.2 g/dL (ref 3.6–5.1)
Alkaline phosphatase (APISO): 90 U/L (ref 37–153)
BUN/Creatinine Ratio: 19 (calc) (ref 6–22)
BUN: 22 mg/dL (ref 7–25)
CO2: 27 mmol/L (ref 20–32)
Calcium: 9.4 mg/dL (ref 8.6–10.4)
Chloride: 102 mmol/L (ref 98–110)
Creat: 1.16 mg/dL — ABNORMAL HIGH (ref 0.60–0.95)
Globulin: 3.1 g/dL (calc) (ref 1.9–3.7)
Glucose, Bld: 99 mg/dL (ref 65–99)
Potassium: 4.1 mmol/L (ref 3.5–5.3)
Sodium: 138 mmol/L (ref 135–146)
Total Bilirubin: 0.4 mg/dL (ref 0.2–1.2)
Total Protein: 7.3 g/dL (ref 6.1–8.1)

## 2021-06-27 LAB — VITAMIN D 25 HYDROXY (VIT D DEFICIENCY, FRACTURES): Vit D, 25-Hydroxy: 30 ng/mL (ref 30–100)

## 2021-06-27 LAB — T4, FREE: Free T4: 1.4 ng/dL (ref 0.8–1.8)

## 2021-06-27 LAB — TSH: TSH: 0.81 mIU/L (ref 0.40–4.50)

## 2021-06-27 LAB — HEMOGLOBIN A1C
Hgb A1c MFr Bld: 6 % of total Hgb — ABNORMAL HIGH (ref <5.7)
Mean Plasma Glucose: 126 mg/dL
eAG (mmol/L): 7 mmol/L

## 2021-06-27 LAB — T3, FREE: T3, Free: 2.9 pg/mL (ref 2.3–4.2)

## 2021-06-29 ENCOUNTER — Telehealth: Payer: Self-pay

## 2021-06-29 NOTE — Chronic Care Management (AMB) (Signed)
Transition CCM to Self Care ? ?Patient contacted to inform they have achieved their CCM goals and no longer need to be contacted as frequently. Patient advised services will still be available to them if they would like to reach out or have any new health concerns. Verified patient had contact information to pharmacist and health concierge on hand. Patient made aware CCM services would be continued if desired. Patient consented to cancel future CCM appointments.  ? ?Charlene Brooke, CPP notified ? ?Gracyn Allor, CCMA ?Health concierge  ?[phone]  ?

## 2021-07-14 ENCOUNTER — Other Ambulatory Visit: Payer: Self-pay | Admitting: Family Medicine

## 2021-07-15 ENCOUNTER — Telehealth: Payer: Medicare Other

## 2021-07-17 NOTE — Assessment & Plan Note (Signed)
Chronic, well controlled  ? ?Continue amlodipine/valsartan 10/160 mg daily, Lasix 40 mg p.o. daily as needed, metoprolol XL 50 mg p.o. daily ?

## 2021-07-17 NOTE — Assessment & Plan Note (Signed)
You for reevaluation ?

## 2021-07-17 NOTE — Assessment & Plan Note (Signed)
Chronic, stable control ? ?She does well if she takes the hydrocodone 5/325 mg 1-2 tabs daily as needed. ?UDS is not indicated in this patient.   ?PDMP reviewed and unremarkable ?

## 2021-07-17 NOTE — Assessment & Plan Note (Signed)
Chronic, stable control on no inhaler.  She is pleased with her control. she is without oxygen requirement. ?

## 2021-07-17 NOTE — Assessment & Plan Note (Signed)
Chronic, due for reevaluation 

## 2021-07-17 NOTE — Assessment & Plan Note (Signed)
Due for reevaluation 

## 2021-08-21 ENCOUNTER — Other Ambulatory Visit: Payer: Self-pay | Admitting: Family Medicine

## 2021-09-16 ENCOUNTER — Other Ambulatory Visit: Payer: Self-pay | Admitting: Family Medicine

## 2021-10-21 ENCOUNTER — Other Ambulatory Visit: Payer: Self-pay | Admitting: Family Medicine

## 2021-10-21 NOTE — Telephone Encounter (Signed)
Last office visit 06/26/21 for CPE.  Last refilled 06/26/21 fjor #60 with no refills. No future appointments.

## 2022-03-02 NOTE — Progress Notes (Signed)
    Diana Preiss T. Previn Jian, MD, [name] at [name]  Phone: [phone]  FAX: [place] - 88 y.o. female  MRN [id number]  Date of Birth: Mar 30, 1924  Date: 03/03/2022  PCP: Jinny Sanders, MD  Referral: Jinny Sanders, MD  No chief complaint on file.  Subjective:   Diana [name] is a 88 y.o. very pleasant female patient with There is no height or weight on file to calculate BMI. who presents with the following:  Is a pleasant elderly patient who presents with some ongoing knee pain.  I have seen her before over 3 years ago for some right-sided knee pain and osteoarthritis.  At that time, I did do an intra-articular injection on the right knee.  She has seen various other providers, more recently at [facility], and they have also done intra-articular injection with good success.    Review of Systems is noted in the HPI, as appropriate  Objective:   There were no vitals taken for this visit.  GEN: No acute distress; alert,appropriate. PULM: Breathing comfortably in no respiratory distress PSYCH: Normally interactive.   Laboratory and Imaging Data:  Assessment and Plan:   ***

## 2022-03-03 ENCOUNTER — Encounter: Payer: Self-pay | Admitting: Family Medicine

## 2022-03-03 ENCOUNTER — Ambulatory Visit (INDEPENDENT_AMBULATORY_CARE_PROVIDER_SITE_OTHER): Payer: Medicare Other | Admitting: Family Medicine

## 2022-03-03 VITALS — BP 104/70 | HR 76 | Temp 97.9°F | Ht 61.0 in | Wt 155.0 lb

## 2022-03-03 DIAGNOSIS — M1712 Unilateral primary osteoarthritis, left knee: Secondary | ICD-10-CM | POA: Diagnosis not present

## 2022-03-03 DIAGNOSIS — M170 Bilateral primary osteoarthritis of knee: Secondary | ICD-10-CM

## 2022-03-03 MED ORDER — TRIAMCINOLONE ACETONIDE 40 MG/ML IJ SUSP
40.0000 mg | Freq: Once | INTRAMUSCULAR | Status: AC
  Administered 2022-03-03: 40 mg via INTRA_ARTICULAR

## 2022-03-03 NOTE — Addendum Note (Signed)
Addended by: Carter Kitten on: 03/03/2022 03:14 PM   Modules accepted: Orders

## 2022-03-16 ENCOUNTER — Other Ambulatory Visit: Payer: Self-pay | Admitting: Family Medicine

## 2022-04-06 ENCOUNTER — Other Ambulatory Visit: Payer: Self-pay | Admitting: Family Medicine

## 2022-04-06 NOTE — Telephone Encounter (Signed)
Last office visit 03/03/22 with Dr. Lorelei Pont for Knee Pain/Sore on Left Leg.  Last refilled 10/21/21 for #60 with no refills.  No future appointments.

## 2022-04-06 NOTE — Telephone Encounter (Signed)
PDMP reviewed during this encounter.

## 2022-04-13 ENCOUNTER — Ambulatory Visit (INDEPENDENT_AMBULATORY_CARE_PROVIDER_SITE_OTHER): Payer: Medicare Other | Admitting: Family Medicine

## 2022-04-13 ENCOUNTER — Encounter: Payer: Self-pay | Admitting: Family Medicine

## 2022-04-13 VITALS — BP 104/58 | HR 97 | Temp 97.4°F | Ht 61.0 in

## 2022-04-13 DIAGNOSIS — T148XXA Other injury of unspecified body region, initial encounter: Secondary | ICD-10-CM | POA: Insufficient documentation

## 2022-04-13 NOTE — Progress Notes (Signed)
Subjective:    Patient ID: Diana [name], female    DOB: 01-Dec-1923, 88 y.o.   MRN: [id number]  HPI 88 yo pt of Dr Diona Browner presents with L leg pain and bruising   Wt Readings from Last 3 Encounters:  03/03/22 155 lb (70.3 kg)  06/26/21 163 lb (73.9 kg)  05/06/21 160 lb (72.6 kg)   29.29 kg/m  Vitals:   04/13/22 1444  BP: (!) 104/58  Pulse: 97  Temp: (!) 97.4 F (36.3 C)  Height: '5\' 1"'$  (1.549 m)  Weight: Comment: Wheelchair  SpO2: 97%  TempSrc: Temporal    Symptoms started several weeks ago as a bruise  It got better  Is sore - tender to the touch   Feet and legs hurt at night from arthritis   No known trauma   Gets up 3-4 times each night for bathroom   No asa or blood thinners    Lab Results  Component Value Date   WBC 5.5 06/26/2021   HGB 13.5 06/26/2021   HCT 39.6 06/26/2021   MCV 93.6 06/26/2021   PLT 159 06/26/2021   Lab Results  Component Value Date   IRON 55 01/03/2018   FERRITIN 60.2 01/03/2018    Patient Active Problem List   Diagnosis Date Noted   Hematoma 04/13/2022   Other chronic pain 06/26/2021   Burning with urination 03/01/2018   Sore throat 12/27/2017   Iron deficiency anemia due to chronic blood loss 06/16/2017   Lymphedema of right arm 11/17/2016   Vaginal irritation 10/26/2016   Right-sided low back pain without sciatica 08/22/2015   Spinal stenosis in cervical region 08/07/2015   Cervical spondylosis with myelopathy 06/26/2015   Aortic valve calcification 06/03/2015   BPV (benign positional vertigo) 01/29/2015   Poor balance 10/07/2011   History of right breast cancer 03/29/2011   Vitamin D deficiency 10/17/2009   Prediabetes 10/17/2009   COPD (chronic obstructive pulmonary disease) ([place]) 12/19/2007   INSOMNIA, CHRONIC 10/25/2007   BACK PAIN, LUMBAR 03/01/2007   Hypothyroidism 01/18/2007   HYPONATREMIA, MILD 01/18/2007   Essential hypertension 01/18/2007   Past Medical History:  Diagnosis Date   Arthritis     Bacterial pneumonia, unspecified    Cancer , breast    Candidiasis of mouth    Chronic airway obstruction, not elsewhere classified    son not aware of this   Encephalitis 1961   Family history of adverse reaction to anesthesia    son has nausea   Heart murmur    has had it for years; very mild AS 05/2015 echo   Hyposmolality and/or hyponatremia    Lumbago    Obstructive chronic bronchitis with exacerbation ([place])    Other abnormal glucose    Pain in joint, shoulder region    Palpitations    Persistent disorder of initiating or maintaining sleep    Restless leg syndrome    Unspecified essential hypertension    Unspecified hypothyroidism    Unspecified viral infection, in conditions classified elsewhere and of unspecified site    Unspecified vitamin D deficiency    Urinary tract infection, site not specified    Past Surgical History:  Procedure Laterality Date   ABI  04/19/05   Negative   Back MRI  2005   mild bulging disks   EYE SURGERY Bilateral    Cataract surgery    HEMORRHOID SURGERY     MASTECTOMY, RADICAL     POSTERIOR CERVICAL LAMINECTOMY N/A 06/26/2015   Procedure: Cervical three-four  POSTERIOR CERVICAL LAMINECTOMY for decompression ;  Surgeon: Kevan Ny Ditty, MD;  Location: [facility];  Service: Neurosurgery;  Laterality: N/A;  C34 laminectomy   Venous doppler  04/19/05   LE normal   Social History   Tobacco Use   Smoking status: Former    Packs/day: 0.50    Years: 42.00    Total pack years: 21.00    Types: Cigarettes   Smokeless tobacco: Never  Substance Use Topics   Alcohol use: No   Drug use: No   Family History  Problem Relation Age of Onset   Rheum arthritis Father    Stroke Mother    Diabetes Mother    Healthy Sister    Healthy Sister    Liver cancer Unknown        Aunt   Stomach cancer Unknown        Aunt   Allergies  Allergen Reactions   Meperidine Hcl Nausea And Vomiting   Current Outpatient Medications on File Prior to Visit   Medication Sig Dispense Refill   amLODipine-valsartan (EXFORGE) 10-160 MG tablet TAKE 1 TABLET BY MOUTH ONCE DAILY 90 tablet 0   furosemide (LASIX) 40 MG tablet TAKE 1 TABLET BY MOUTH ONCE DAILY 90 tablet 0   HYDROcodone-acetaminophen (NORCO/VICODIN) 5-325 MG tablet TAKE 1 TO 2 TABLET BY MOUTH DAILY AS NEEDED FOR SEVERE PAIN 60 tablet 0   levothyroxine (SYNTHROID) 100 MCG tablet TAKE 1 TABLET BY MOUTH ONCE A DAY 90 tablet 3   metoprolol succinate (TOPROL-XL) 50 MG 24 hr tablet TAKE 1 TABLET BY MOUTH ONCE DAILY WITH OR IMMEDIATELY FOLLOWING A MEAL 90 tablet 3   senna (SENOKOT) 8.6 MG TABS tablet Take 1 tablet by mouth daily as needed for mild constipation.     traZODone (DESYREL) 50 MG tablet TAKE 1/2 TO 1 TABLET BY MOUTH AT BEDTIMEAS NEEDED FOR SLEEP 90 tablet 0   No current facility-administered medications on file prior to visit.     Review of Systems  HENT:  Positive for hearing loss. Negative for ear pain.   Respiratory:  Negative for cough, chest tightness and shortness of breath.   Cardiovascular:  Negative for chest pain and leg swelling.  Musculoskeletal:        Some tenderness over bruised area on L lower leg        Objective:   Physical Exam Constitutional:      General: She is not in acute distress.    Appearance: Normal appearance. She is obese. She is not ill-appearing or diaphoretic.     Comments: In wheelchair   HENT:     Head: Normocephalic and atraumatic.     Ears:     Comments: Very hard of hearing  Family here to help communicate  Eyes:     General: No scleral icterus.    Conjunctiva/sclera: Conjunctivae normal.     Pupils: Pupils are equal, round, and reactive to light.  Cardiovascular:     Rate and Rhythm: Normal rate and regular rhythm.     Heart sounds: Normal heart sounds.     Comments: Plus one pedal pulses  Some mild compressible varicosities  Pulmonary:     Effort: Pulmonary effort is normal. No respiratory distress.  Musculoskeletal:         General: Tenderness present.     Cervical back: Neck supple.     Right lower leg: No edema.     Left lower leg: No edema.     Comments: No palp  cords Neg homan's sign   Lymphadenopathy:     Cervical: No cervical adenopathy.  Skin:    Comments: Left lower leg Ecchymosis (partially fading) -patchy over anterior lateral area with soft palpable hematoma  Very little tenderness in the center  No skin breakdown noted (some healed abraded area in the center) Thin skin but no tearing    Neurological:     Mental Status: She is alert.     Sensory: No sensory deficit.  Psychiatric:        Mood and Affect: Mood normal.           Assessment & Plan:   Problem List Items Addressed This Visit       Other   Hematoma - Primary    Superficial bruising with some mild tenderness over ant/lat L lower leg  Looks to be fading at this point No known trauma but pt does get up to go to bathroom often at night and may have hit something  Recommend elevation /warm or cool compress if bothersome Reassuring exam  Update if not starting to improve in a week or if worsening  ER precautions noted

## 2022-04-13 NOTE — Patient Instructions (Addendum)
This looks like bruising from trauma that is starting to improve  You can try elevating leg and using a gentle warm or cool  compress if bothersome   Protect the area for further trauma Keep eye on it   If no further improvement or if it starts to hurt more let us know

## 2022-04-13 NOTE — Assessment & Plan Note (Signed)
Superficial bruising with some mild tenderness over ant/lat L lower leg  Looks to be fading at this point No known trauma but pt does get up to go to bathroom often at night and may have hit something  Recommend elevation /warm or cool compress if bothersome Reassuring exam  Update if not starting to improve in a week or if worsening  ER precautions noted

## 2022-06-10 ENCOUNTER — Ambulatory Visit (INDEPENDENT_AMBULATORY_CARE_PROVIDER_SITE_OTHER): Payer: Medicare Other

## 2022-06-10 VITALS — Ht 61.0 in | Wt 152.0 lb

## 2022-06-10 DIAGNOSIS — Z0000 Encounter for general adult medical examination without abnormal findings: Secondary | ICD-10-CM | POA: Diagnosis not present

## 2022-06-10 NOTE — Patient Instructions (Addendum)
Diana [name] , Thank you for taking time to come for your Medicare Wellness Visit. I appreciate your ongoing commitment to your health goals. Please review the following plan we discussed and let me know if I can assist you in the future.   These are the goals we discussed:  Goals      Patient Stated     Would like to maintain current routine     Patient Stated     none     [place] (see longitudinal plan of care for additional care plan information)  Current Barriers:  Chronic Disease Management support, education, and care coordination needs related to Hypertension, Hyperlipidemia, and Diabetes   Hypertension BP Readings from Last 3 Encounters:  02/15/19 108/70  09/18/18 90/70  03/13/18 (!) 150/82  Pharmacist Clinical Goal(s): Over the next 90 days, patient will work with PharmD and providers to maintain BP goal <140/90 Current regimen:  Amlodipine-valsartan 10-160 mg - 1 tablet daily Furosemide 40 mg - 1 tablet daily  Metoprolol succinate 50 mg - 1 tablet daily with meal Interventions: Discussed low sodium in the past. Patient continues to drink gatorade to maintain sodium.  Patient self care activities - Over the next 90 days, patient will: Check BP 3-4 times before physical with Dr. Diona Browner in November, document, and provide at future appointments Ensure daily salt intake < 2300 mg/day  Hyperlipidemia Lab Results  Component Value Date/Time   LDLCALC 123 (H) 04/23/2016 05:16 PM  Pharmacist Clinical Goal(s): Over the next 90 days, patient will work with PharmD and providers to achieve LDL goal < 100  Current regimen:  Diet and lifestyle Interventions: Patient avoid fried foods and mainly eats chicken.  Patient self care activities - Over the next 90 days, patient will: Continue to avoid fried/fatty foods.   Prediabetes Lab Results  Component Value Date/Time   HGBA1C 6.0 02/15/2019 03:29 PM   HGBA1C 6.2 01/03/2018 03:04 PM   Pharmacist Clinical Goal(s): Over the next 90 days, patient will work with PharmD and providers to maintain A1c goal <6.5% Current regimen:  Diet and lifestyle Interventions: Patient discussed that she mainly drinks water and avoids soft drinks.  Patient self care activities - Over the next 90 days, patient will: Continue to manage blood sugar in diet.   Medication management Pharmacist Clinical Goal(s): Over the next 90 days, patient will work with PharmD and providers to maintain optimal medication adherence Current pharmacy: [facility]  Interventions Comprehensive medication review performed. Continue current medication management strategy Patient self care activities - Over the next 90 days, patient will: Focus on medication adherence by taking medication each morning out of bottles Take medications as prescribed Report any questions or concerns to PharmD and/or provider(s)  Please see past updates related to this goal by clicking on the "Past Updates" button in the selected goal          This is a list of the screening recommended for you and due dates:  Health Maintenance  Topic Date Due   Zoster (Shingles) Vaccine (1 of 2) Never done   DEXA scan (bone density measurement)  Never done   COVID-19 Vaccine (3 - Pfizer risk series) 07/04/2019   Flu Shot  07/18/2022*   Medicare Annual Wellness Visit  06/11/2023   DTaP/Tdap/Td vaccine (2 - Tdap) 02/23/2024   Pneumonia Vaccine  Completed   HPV Vaccine  Aged Out  *Topic was postponed. The date shown is not the original  due date.   Opioid Pain Medicine Management Opioids are powerful medicines that are used to treat moderate to severe pain. When used for short periods of time, they can help you to: Sleep better. Do better in physical or occupational therapy. Feel better in the first few days after an injury. Recover from surgery. Opioids should be taken with the supervision of a trained health care provider.  They should be taken for the shortest period of time possible. This is because opioids can be addictive, and the longer you take opioids, the greater your risk of addiction. This addiction can also be called opioid use disorder. What are the risks? Using opioid pain medicines for longer than 3 days increases your risk of side effects. Side effects include: Constipation. Nausea and vomiting. Breathing difficulties (respiratory depression). Drowsiness. Confusion. Opioid use disorder. Itching. Taking opioid pain medicine for a long period of time can affect your ability to do daily tasks. It also puts you at risk for: Motor vehicle crashes. Depression. Suicide. Heart attack. Overdose, which can be life-threatening. What is a pain treatment plan? A pain treatment plan is an agreement between you and your health care provider. Pain is unique to each person, and treatments vary depending on your condition. To manage your pain, you and your health care provider need to work together. To help you do this: Discuss the goals of your treatment, including how much pain you might expect to have and how you will manage the pain. Review the risks and benefits of taking opioid medicines. Remember that a good treatment plan uses more than one approach and minimizes the chance of side effects. Be honest about the amount of medicines you take and about any drug or alcohol use. Get pain medicine prescriptions from only one health care provider. Pain can be managed with many types of alternative treatments. Ask your health care provider to refer you to one or more specialists who can help you manage pain through: Physical or occupational therapy. Counseling (cognitive behavioral therapy). Good nutrition. Biofeedback. Massage. Meditation. Non-opioid medicine. Following a gentle exercise program. How to use opioid pain medicine Taking medicine Take your pain medicine exactly as told by your health care  provider. Take it only when you need it. If your pain gets less severe, you may take less than your prescribed dose if your health care provider approves. If you are not having pain, do nottake pain medicine unless your health care provider tells you to take it. If your pain is severe, do nottry to treat it yourself by taking more pills than instructed on your prescription. Contact your health care provider for help. Write down the times when you take your pain medicine. It is easy to become confused while on pain medicine. Writing the time can help you avoid overdose. Take other over-the-counter or prescription medicines only as told by your health care provider. Keeping yourself and others safe  While you are taking opioid pain medicine: Do not drive, use machinery, or power tools. Do not sign legal documents. Do not drink alcohol. Do not take sleeping pills. Do not supervise children by yourself. Do not do activities that require climbing or being in high places. Do not go to a lake, river, ocean, spa, or swimming pool. Do not share your pain medicine with anyone. Keep pain medicine in a locked cabinet or in a secure area where pets and children cannot reach it. Stopping your use of opioids If you have been taking opioid medicine for  more than a few weeks, you may need to slowly decrease (taper) how much you take until you stop completely. Tapering your use of opioids can decrease your risk of symptoms of withdrawal, such as: Pain and cramping in the abdomen. Nausea. Sweating. Sleepiness. Restlessness. Uncontrollable shaking (tremors). Cravings for the medicine. Do not attempt to taper your use of opioids on your own. Talk with your health care provider about how to do this. Your health care provider may prescribe a step-down schedule based on how much medicine you are taking and how long you have been taking it. Getting rid of leftover pills Do not save any leftover pills. Get rid of  leftover pills safely by: Taking the medicine to a prescription take-back program. This is usually offered by the county or law enforcement. Bringing them to a pharmacy that has a drug disposal container. Flushing them down the toilet. Check the label or package insert of your medicine to see whether this is safe to do. Throwing them out in the trash. Check the label or package insert of your medicine to see whether this is safe to do. If it is safe to throw it out, remove the medicine from the original container, put it into a sealable bag or container, and mix it with used coffee grounds, food scraps, dirt, or cat litter before putting it in the trash. Follow these instructions at home: Activity Do exercises as told by your health care provider. Avoid activities that make your pain worse. Return to your normal activities as told by your health care provider. Ask your health care provider what activities are safe for you. General instructions You may need to take these actions to prevent or treat constipation: Drink enough fluid to keep your urine pale yellow. Take over-the-counter or prescription medicines. Eat foods that are high in fiber, such as beans, whole grains, and fresh fruits and vegetables. Limit foods that are high in fat and processed sugars, such as fried or sweet foods. Keep all follow-up visits. This is important. Where to find support If you have been taking opioids for a long time, you may benefit from receiving support for quitting from a local support group or counselor. Ask your health care provider for a referral to these resources in your area. Where to find more information Centers for Disease Control and Prevention (CDC): http://www.wolf.info/ [place] Food and Drug Administration (FDA): [employer] Get help right away if: You may have taken too much of an opioid (overdosed). Common symptoms of an overdose: Your breathing is slower or more shallow than normal. You have a very  slow heartbeat (pulse). You have slurred speech. You have nausea and vomiting. Your pupils become very small. You have other potential symptoms: You are very confused. You faint or feel like you will faint. You have cold, clammy skin. You have blue lips or fingernails. You have thoughts of harming yourself or harming others. These symptoms may represent a serious problem that is an emergency. Do not wait to see if the symptoms will go away. Get medical help right away. Call your local emergency services (911 in the [place] Do not drive yourself to the hospital.  If you ever feel like you may hurt yourself or others, or have thoughts about taking your own life, get help right away. Go to your nearest emergency department or: Call your local emergency services (911 in the [place] Call the [facility] 506-182-0806 in the [place] Call a suicide crisis helpline, such as the  National Suicide Prevention Lifeline at 680-367-5838 or 988 in the [place]. This is open 24 hours a day in the [place] Text the Crisis Text Line at 7808070883 (in the [place].). Summary Opioid medicines can help you manage moderate to severe pain for a short period of time. A pain treatment plan is an agreement between you and your health care provider. Discuss the goals of your treatment, including how much pain you might expect to have and how you will manage the pain. If you think that you or someone else may have taken too much of an opioid, get medical help right away. This information is not intended to replace advice given to you by your health care provider. Make sure you discuss any questions you have with your health care provider. Document Revised: 10/29/2020 Document Reviewed: 07/16/2020 Elsevier Patient Education  [place] directives: Advance directive discussed with you today. Even though you declined this today, please call our office should you change your mind, and we can give you  the proper paperwork for you to fill out.   Conditions/risks identified: Aim for 30 minutes of exercise or brisk walking, 6-8 glasses of water, and 5 servings of fruits and vegetables each day.   Next appointment: Follow up in one year for your annual wellness visit 06/16/2023 @ 9:45am via telephone.   Preventive Care 97 Years and Older, Female Preventive care refers to lifestyle choices and visits with your health care provider that can promote health and wellness. What does preventive care include? A yearly physical exam. This is also called an annual well check. Dental exams once or twice a year. Routine eye exams. Ask your health care provider how often you should have your eyes checked. Personal lifestyle choices, including: Daily care of your teeth and gums. Regular physical activity. Eating a healthy diet. Avoiding tobacco and drug use. Limiting alcohol use. Practicing safe sex. Taking low-dose aspirin every day. Taking vitamin and mineral supplements as recommended by your health care provider. What happens during an annual well check? The services and screenings done by your health care provider during your annual well check will depend on your age, overall health, lifestyle risk factors, and family history of disease. Counseling  Your health care provider may ask you questions about your: Alcohol use. Tobacco use. Drug use. Emotional well-being. Home and relationship well-being. Sexual activity. Eating habits. History of falls. Memory and ability to understand (cognition). Work and work Statistician. Reproductive health. Screening  You may have the following tests or measurements: Height, weight, and BMI. Blood pressure. Lipid and cholesterol levels. These may be checked every 5 years, or more frequently if you are over 68 years old. Skin check. Lung cancer screening. You may have this screening every year starting at age 9 if you have a 30-pack-year history of  smoking and currently smoke or have quit within the past 15 years. Fecal occult blood test (FOBT) of the stool. You may have this test every year starting at age 77. Flexible sigmoidoscopy or colonoscopy. You may have a sigmoidoscopy every 5 years or a colonoscopy every 10 years starting at age 35. Hepatitis C blood test. Hepatitis B blood test. Sexually transmitted disease (STD) testing. Diabetes screening. This is done by checking your blood sugar (glucose) after you have not eaten for a while (fasting). You may have this done every 1-3 years. Bone density scan. This is done to screen for osteoporosis. You may have this done starting at age 49. Mammogram.  This may be done every 1-2 years. Talk to your health care provider about how often you should have regular mammograms. Talk with your health care provider about your test results, treatment options, and if necessary, the need for more tests. Vaccines  Your health care provider may recommend certain vaccines, such as: Influenza vaccine. This is recommended every year. Tetanus, diphtheria, and acellular pertussis (Tdap, Td) vaccine. You may need a Td booster every 10 years. Zoster vaccine. You may need this after age 44. Pneumococcal 13-valent conjugate (PCV13) vaccine. One dose is recommended after age 28. Pneumococcal polysaccharide (PPSV23) vaccine. One dose is recommended after age 32. Talk to your health care provider about which screenings and vaccines you need and how often you need them. This information is not intended to replace advice given to you by your health care provider. Make sure you discuss any questions you have with your health care provider. Document Released: 05/02/2015 Document Revised: 12/24/2015 Document Reviewed: 02/04/2015 Elsevier Interactive Patient Education  2017 [place] Prevention in the Home Falls can cause injuries. They can happen to people of all ages. There are many things you can do to make  your home safe and to help prevent falls. What can I do on the outside of my home? Regularly fix the edges of walkways and driveways and fix any cracks. Remove anything that might make you trip as you walk through a door, such as a raised step or threshold. Trim any bushes or trees on the path to your home. Use bright outdoor lighting. Clear any walking paths of anything that might make someone trip, such as rocks or tools. Regularly check to see if handrails are loose or broken. Make sure that both sides of any steps have handrails. Any raised decks and porches should have guardrails on the edges. Have any leaves, snow, or ice cleared regularly. Use sand or salt on walking paths during winter. Clean up any spills in your garage right away. This includes oil or grease spills. What can I do in the bathroom? Use night lights. Install grab bars by the toilet and in the tub and shower. Do not use towel bars as grab bars. Use non-skid mats or decals in the tub or shower. If you need to sit down in the shower, use a plastic, non-slip stool. Keep the floor dry. Clean up any water that spills on the floor as soon as it happens. Remove soap buildup in the tub or shower regularly. Attach bath mats securely with double-sided non-slip rug tape. Do not have throw rugs and other things on the floor that can make you trip. What can I do in the bedroom? Use night lights. Make sure that you have a light by your bed that is easy to reach. Do not use any sheets or blankets that are too big for your bed. They should not hang down onto the floor. Have a firm chair that has side arms. You can use this for support while you get dressed. Do not have throw rugs and other things on the floor that can make you trip. What can I do in the kitchen? Clean up any spills right away. Avoid walking on wet floors. Keep items that you use a lot in easy-to-reach places. If you need to reach something above you, use a  strong step stool that has a grab bar. Keep electrical cords out of the way. Do not use floor polish or wax that makes floors slippery. If you  must use wax, use non-skid floor wax. Do not have throw rugs and other things on the floor that can make you trip. What can I do with my stairs? Do not leave any items on the stairs. Make sure that there are handrails on both sides of the stairs and use them. Fix handrails that are broken or loose. Make sure that handrails are as long as the stairways. Check any carpeting to make sure that it is firmly attached to the stairs. Fix any carpet that is loose or worn. Avoid having throw rugs at the top or bottom of the stairs. If you do have throw rugs, attach them to the floor with carpet tape. Make sure that you have a light switch at the top of the stairs and the bottom of the stairs. If you do not have them, ask someone to add them for you. What else can I do to help prevent falls? Wear shoes that: Do not have high heels. Have rubber bottoms. Are comfortable and fit you well. Are closed at the toe. Do not wear sandals. If you use a stepladder: Make sure that it is fully opened. Do not climb a closed stepladder. Make sure that both sides of the stepladder are locked into place. Ask someone to hold it for you, if possible. Clearly mark and make sure that you can see: Any grab bars or handrails. First and last steps. Where the edge of each step is. Use tools that help you move around (mobility aids) if they are needed. These include: Canes. Walkers. Scooters. Crutches. Turn on the lights when you go into a dark area. Replace any light bulbs as soon as they burn out. Set up your furniture so you have a clear path. Avoid moving your furniture around. If any of your floors are uneven, fix them. If there are any pets around you, be aware of where they are. Review your medicines with your doctor. Some medicines can make you feel dizzy. This can  increase your chance of falling. Ask your doctor what other things that you can do to help prevent falls. This information is not intended to replace advice given to you by your health care provider. Make sure you discuss any questions you have with your health care provider. Document Released: 01/30/2009 Document Revised: 09/11/2015 Document Reviewed: 05/10/2014 Elsevier Interactive Patient Education  2017 [employer].

## 2022-06-10 NOTE — Progress Notes (Addendum)
I connected with  Diana [name] and Diana [name] on 06/10/22 by a audio enabled telemedicine application and verified that I am speaking with the correct person using two identifiers.  Patient Location: Home  Provider Location: Home Office  I discussed the limitations of evaluation and management by telemedicine. The patient expressed understanding and agreed to proceed.  Subjective:   Diana [name] is a 89 y.o. female who presents for Medicare Annual (Subsequent) preventive examination.  Review of Systems     Cardiac Risk Factors include: advanced age (>1mn, >>59women);hypertension;sedentary lifestyle     Objective:    Today's Vitals   06/10/22 1253 06/10/22 1254  Weight: 152 lb (68.9 kg)   Height: 5' 1"$  (1.549 m)   PainSc:  8    Body mass index is 28.72 kg/m.     06/10/2022    1:04 PM 05/06/2021    2:11 PM 02/02/2017    2:13 PM 01/10/2017   12:53 PM 05/08/2016   12:12 PM 04/23/2016    5:07 PM 07/14/2015   11:47 AM  Advanced Directives  Does Patient Have a Medical Advance Directive? Yes Yes Yes Yes Yes Yes Yes  Type of AParamedicof [place] will [place]Living will [place]Living will [place]Living will Healthcare Power of [place]Living will   Does patient want to make changes to medical advance directive? No - Patient declined Yes (MAU/Ambulatory/Procedural Areas - Information given)       Copy of [place] Chart? Yes - validated most recent copy scanned in chart (See row information)  No - copy requested No - copy requested  No - copy requested     Current Medications (verified) Outpatient Encounter Medications as of 06/10/2022  Medication Sig   amLODipine-valsartan (EXFORGE) 10-160 MG tablet TAKE 1 TABLET BY MOUTH ONCE DAILY   furosemide (LASIX) 40 MG tablet TAKE 1 TABLET BY MOUTH ONCE DAILY    HYDROcodone-acetaminophen (NORCO/VICODIN) 5-325 MG tablet TAKE 1 TO 2 TABLET BY MOUTH DAILY AS NEEDED FOR SEVERE PAIN   levothyroxine (SYNTHROID) 100 MCG tablet TAKE 1 TABLET BY MOUTH ONCE A DAY   metoprolol succinate (TOPROL-XL) 50 MG 24 hr tablet TAKE 1 TABLET BY MOUTH ONCE DAILY WITH OR IMMEDIATELY FOLLOWING A MEAL   senna (SENOKOT) 8.6 MG TABS tablet Take 1 tablet by mouth daily as needed for mild constipation.   traZODone (DESYREL) 50 MG tablet TAKE 1/2 TO 1 TABLE[facility]   No facility-administered encounter medications on file as of 06/10/2022.    Allergies (verified) Meperidine hcl   History: Past Medical History:  Diagnosis Date   Arthritis    Bacterial pneumonia, unspecified    Cancer , breast    Candidiasis of mouth    Chronic airway obstruction, not elsewhere classified    son not aware of this   Encephalitis 1961   Family history of adverse reaction to anesthesia    son has nausea   Heart murmur    has had it for years; very mild AS 05/2015 echo   Hyposmolality and/or hyponatremia    Lumbago    Obstructive chronic bronchitis with exacerbation (HWade    Other abnormal glucose    Pain in joint, shoulder region    Palpitations    Persistent disorder of initiating or maintaining sleep    Restless leg syndrome    Unspecified essential hypertension  Unspecified hypothyroidism    Unspecified viral infection, in conditions classified elsewhere and of unspecified site    Unspecified vitamin D deficiency    Urinary tract infection, site not specified    Past Surgical History:  Procedure Laterality Date   ABI  04/19/05   Negative   Back MRI  2005   mild bulging disks   EYE SURGERY Bilateral    Cataract surgery    HEMORRHOID SURGERY     MASTECTOMY, RADICAL     POSTERIOR CERVICAL LAMINECTOMY N/A 06/26/2015   Procedure: Cervical three-four POSTERIOR CERVICAL LAMINECTOMY for decompression ;  Surgeon: Kevan Ny Ditty, MD;  Location: [facility]UR[facility];  Service: Neurosurgery;  Laterality: N/A;  C34 laminectomy   Venous doppler  04/19/05   LE normal   Family History  Problem Relation Age of Onset   Rheum arthritis Father    Stroke Mother    Diabetes Mother    Healthy Sister    Healthy Sister    Liver cancer Unknown        Aunt   Stomach cancer Unknown        Aunt   Social History   Socioeconomic History   Marital status: Widowed    Spouse name: Not on file   Number of children: Not on file   Years of education: Not on file   Highest education level: Not on file  Occupational History   Not on file  Tobacco Use   Smoking status: Former    Packs/day: 0.50    Years: 42.00    Total pack years: 21.00    Types: Cigarettes   Smokeless tobacco: Never  Substance and Sexual Activity   Alcohol use: No   Drug use: No   Sexual activity: Not on file  Other Topics Concern   Not on file  Social History Narrative   Married x60 years      No regular exercise-uses cane to ambulate, unsteady on feet      Diet: Moderately healthy, a lot of desserts       Living will, HCPOA: son: Diana [name]   Social Determinants of Health   Financial Resource Strain: Low Risk  (06/10/2022)   Overall Financial Resource Strain (CARDIA)    Difficulty of Paying Living Expenses: Not hard at all  Food Insecurity: No Food Insecurity (06/10/2022)   Hunger Vital Sign    Worried About Running Out of Food in the Last Year: Never true    Ran Out of Food in the Last Year: Never true  Transportation Needs: No Transportation Needs (06/10/2022)   PRAPARE - Hydrologist (Medical): No    Lack of Transportation (Non-Medical): No  Physical Activity: Inactive (06/10/2022)   Exercise Vital Sign    Days of Exercise per Week: 0 days    Minutes of Exercise per Session: 0 min  Stress: No Stress Concern Present (06/10/2022)   [place]    Feeling of Stress : Not  at all  Social Connections: Socially Isolated (06/10/2022)   Social Connection and Isolation Panel [NHANES]    Frequency of Communication with Friends and Family: More than three times a week    Frequency of Social Gatherings with Friends and Family: More than three times a week    Attends Religious Services: Never    Marine scientist or Organizations: No    Attends Archivist Meetings: Never    Marital  Status: Widowed    Tobacco Counseling Counseling given: Not Answered   Clinical Intake:  Pre-visit preparation completed: Yes  Pain : 0-10 Pain Score: 8  (Arthritis) Pain Type: Chronic pain Pain Location: Generalized     Nutritional Risks: None Diabetes: No  How often do you need to have someone help you when you read instructions, pamphlets, or other written materials from your doctor or pharmacy?: 1 - Never  Diabetic? no  Interpreter Needed?: No  Information entered by :: C.Jabez Molner LPN   Activities of Daily Living    06/10/2022    1:06 PM  In your present state of health, do you have any difficulty performing the following activities:  Hearing? 1  Comment son assist  Vision? 0  Difficulty concentrating or making decisions? 0  Walking or climbing stairs? 1  Comment uses walker  Dressing or bathing? 0  Doing errands, shopping? 1  Comment son assists  Preparing Food and eating ? N  Using the Toilet? N  In the past six months, have you accidently leaked urine? N  Do you have problems with loss of bowel control? N  Managing your Medications? N  Managing your Finances? Y  Comment son assists  Housekeeping or managing your Housekeeping? N    Patient Care Team: Jinny Sanders, MD as PCP - General Debbora Dus, [facility] as Pharmacist (Pharmacist)  Indicate any recent Medical Services you may have received from other than [facility] providers in the past year (date may be approximate).     Assessment:   This is a routine wellness examination for  Diana [name].  Hearing/Vision screen Hearing Screening - Comments:: No aids Vision Screening - Comments:: Glasses - Dr.Bowen  Dietary issues and exercise activities discussed: Current Exercise Habits: The patient does not participate in regular exercise at present (uses walker, arthritis), Exercise limited by: orthopedic condition(s) (Arthritis)   Goals Addressed             This Visit's Progress    Patient Stated       none       Depression Screen    06/10/2022    1:03 PM 06/26/2021    3:21 PM 05/06/2021    2:20 PM 03/04/2020    3:13 PM 02/15/2019    2:50 PM 01/03/2018    2:30 PM 04/23/2016    4:28 PM  PHQ 2/9 Scores  PHQ - 2 Score 0 1 1 0 0 1 0  PHQ- 9 Score      4     Fall Risk    06/10/2022    1:05 PM 06/26/2021    3:20 PM 05/06/2021    2:16 PM 03/04/2020    3:13 PM 02/15/2019    2:50 PM  Fall Risk   Falls in the past year? 1 1 1 $ 0 0  Number falls in past yr: 0 0 0    Injury with Fall? 0 1 1    Comment  cut on side head broke ribs    Risk for fall due to : Impaired balance/gait;Impaired mobility Impaired balance/gait Impaired balance/gait    Follow up Falls prevention discussed;Education provided;Falls evaluation completed  Falls prevention discussed      FALL RISK PREVENTION PERTAINING TO THE HOME:  Any stairs in or around the home? No  If so, are there any without handrails? No  Home free of loose throw rugs in walkways, pet beds, electrical cords, etc? Yes  Adequate lighting in your home to reduce risk of falls? Yes  ASSISTIVE DEVICES UTILIZED TO PREVENT FALLS:  Life alert? Yes  Use of a cane, walker or w/c? Yes , walker Grab bars in the bathroom? Yes  Shower chair or bench in shower? No  Elevated toilet seat or a handicapped toilet? Yes    Cognitive Function:        06/10/2022    1:08 PM  6CIT Screen  What Year? 0 points  What month? 0 points  What time? 0 points  Count back from 20 0 points  Months in reverse 2 points  Repeat phrase 6 points   Total Score 8 points    Immunizations Immunization History  Administered Date(s) Administered   Fluad Quad(high Dose 65+) 01/19/2019   Influenza Whole 01/18/2007, 02/15/2008, 01/28/2009, 01/27/2010, 01/27/2011   Influenza, High Dose Seasonal PF 01/07/2017, 01/05/2020   Influenza,inj,Quad PF,6+ Mos 12/29/2012, 01/10/2014, 01/28/2015, 01/28/2016, 01/03/2018   Influenza-Unspecified 03/18/2021   PFIZER(Purple Top)SARS-COV-2 Vaccination 05/16/2019, 06/06/2019   PPD Test 06/27/2015   Pneumococcal Conjugate-13 01/10/2014   Pneumococcal Polysaccharide-23 01/18/2007   Td 02/22/2014   Zoster, Live 10/17/2009    TDAP status: Up to date  Flu Vaccine status: Declined, Education has been provided regarding the importance of this vaccine but patient still declined. Advised may receive this vaccine at local pharmacy or Health Dept. Aware to provide a copy of the vaccination record if obtained from local pharmacy or Health Dept. Verbalized acceptance and understanding.  Pneumococcal vaccine status: Up to date  Covid-19 vaccine status: Information provided on how to obtain vaccines.  Has had 2 covid shots.  Qualifies for Shingles Vaccine? No   Zostavax completed No   Shingrix Completed?: No.    Education has been provided regarding the importance of this vaccine. Patient has been advised to call insurance company to determine out of pocket expense if they have not yet received this vaccine. Advised may also receive vaccine at local pharmacy or Health Dept. Verbalized acceptance and understanding.  Screening Tests Health Maintenance  Topic Date Due   Zoster Vaccines- Shingrix (1 of 2) Never done   DEXA SCAN  Never done   COVID-19 Vaccine (3 - Pfizer risk series) 07/04/2019   INFLUENZA VACCINE  07/18/2022 (Originally 11/17/2021)   Medicare Annual Wellness (AWV)  06/11/2023   DTaP/Tdap/Td (2 - Tdap) 02/23/2024   Pneumonia Vaccine 36+ Years old  Completed   HPV VACCINES  Aged Out    Health  Maintenance  Health Maintenance Due  Topic Date Due   Zoster Vaccines- Shingrix (1 of 2) Never done   DEXA SCAN  Never done   COVID-19 Vaccine (3 - Pfizer risk series) 07/04/2019    Colorectal cancer screening: No longer required.  Declined  Mammogram status: No longer required due to age. Pt declined.  Bone Scan declined.  Lung Cancer Screening: (Low Dose CT Chest recommended if Age 55-80 years, 30 pack-year currently smoking OR have quit w/in 15years.) does not qualify.   Lung Cancer Screening Referral: no  Additional Screening:  Hepatitis C Screening: does not qualify; not done.  Vision Screening: Recommended annual ophthalmology exams for early detection of glaucoma and other disorders of the eye. Is the patient up to date with their annual eye exam?  Yes  Who is the provider or what is the name of the office in which the patient attends annual eye exams? Dr.Bowen If pt is not established with a provider, would they like to be referred to a provider to establish care? No .   Dental Screening: Recommended annual  dental exams for proper oral hygiene  Community Resource Referral / Chronic Care Management: CRR required this visit?  No   CCM required this visit?  No      Plan:     I have personally reviewed and noted the following in the patient's chart:   Medical and social history Use of alcohol, tobacco or illicit drugs  Current medications and supplements including opioid prescriptions. Patient is currently taking opioid prescriptions. Information provided to patient regarding non-opioid alternatives. Patient advised to discuss non-opioid treatment plan with their provider. Functional ability and status Nutritional status Physical activity Advanced directives List of other physicians Hospitalizations, surgeries, and ER visits in previous 12 months Vitals Screenings to include cognitive, depression, and falls Referrals and appointments  In addition, I have  reviewed and discussed with patient certain preventive protocols, quality metrics, and best practice recommendations. A written personalized care plan for preventive services as well as general preventive health recommendations were provided to patient.     Lebron Conners, LPN   [id number]   Nurse Notes: Vaccinations: declines all Influenza vaccine: recommend every Fall Pneumococcal vaccine: recommend once per lifetime Prevnar-20 Tdap vaccine: recommend every 10 years Shingles vaccine: recommend Shingrix which is 2 doses 2-6 months apart and over 90% effective     Covid-19: recommend 2 doses one month apart with a booster 6 months later

## 2022-06-17 ENCOUNTER — Telehealth: Payer: Self-pay | Admitting: Family Medicine

## 2022-06-17 DIAGNOSIS — E559 Vitamin D deficiency, unspecified: Secondary | ICD-10-CM

## 2022-06-17 DIAGNOSIS — E039 Hypothyroidism, unspecified: Secondary | ICD-10-CM

## 2022-06-17 DIAGNOSIS — R7303 Prediabetes: Secondary | ICD-10-CM

## 2022-06-17 MED ORDER — AMLODIPINE BESYLATE-VALSARTAN 10-160 MG PO TABS: 1.0000 | ORAL_TABLET | Freq: Every day | ORAL | 0 refills | Status: DC

## 2022-06-17 NOTE — Telephone Encounter (Signed)
-----   Message from Velna Hatchet, RT sent at 06/08/2022 10:24 AM EST ----- Regarding: Tue 3/5 lab Patient is scheduled for cpx, please order future labs.  Thanks, Anda Kraft

## 2022-06-17 NOTE — Telephone Encounter (Signed)
Prescription Request  06/17/2022   LOV: 06/26/2021  What is the name of the medication or equipment?  amLODipine-valsartan (EXFORGE) 10-160 MG tablet    Have you contacted your pharmacy to request a refill? No   Which pharmacy would you like this sent to?    [facility], [address] [place] [address] Phone: [phone] Fax: [phone]    Patient notified that their request is being sent to the clinical staff for review and that they should receive a response within 2 business days.   Please advise at Mobile [phone] (mobile)

## 2022-06-17 NOTE — Addendum Note (Signed)
Addended by: Carter Kitten on: 06/17/2022 11:10 AM   Modules accepted: Orders

## 2022-06-17 NOTE — Telephone Encounter (Signed)
Refill printed and faxed to [facility] at [phone].

## 2022-06-22 ENCOUNTER — Other Ambulatory Visit: Payer: Medicare Other

## 2022-06-24 ENCOUNTER — Telehealth: Payer: Self-pay | Admitting: Family Medicine

## 2022-06-24 MED ORDER — FUROSEMIDE 40 MG PO TABS: 40.0000 mg | ORAL_TABLET | Freq: Every day | ORAL | 0 refills | Status: DC

## 2022-06-24 NOTE — Telephone Encounter (Signed)
Prescription Request  06/24/2022  LOV: 06/26/2021  What is the name of the medication or equipment?   furosemide (LASIX) 40 MG tablet    Have you contacted your pharmacy to request a refill? No   Which pharmacy would you like this sent to?    [place], [address] [place] [address] Phone: [phone] Fax: [phone]    Patient notified that their request is being sent to the clinical staff for review and that they should receive a response within 2 business days.   Please advise at Mobile [phone] (mobile)

## 2022-06-24 NOTE — Telephone Encounter (Signed)
Refill sent as requested. 

## 2022-06-29 ENCOUNTER — Ambulatory Visit (INDEPENDENT_AMBULATORY_CARE_PROVIDER_SITE_OTHER): Payer: Medicare Other | Admitting: Family Medicine

## 2022-06-29 ENCOUNTER — Encounter: Payer: Self-pay | Admitting: Family Medicine

## 2022-06-29 VITALS — BP 120/80 | HR 85 | Temp 98.0°F | Ht 61.0 in | Wt 155.0 lb

## 2022-06-29 DIAGNOSIS — R7303 Prediabetes: Secondary | ICD-10-CM

## 2022-06-29 DIAGNOSIS — D500 Iron deficiency anemia secondary to blood loss (chronic): Secondary | ICD-10-CM

## 2022-06-29 DIAGNOSIS — R609 Edema, unspecified: Secondary | ICD-10-CM

## 2022-06-29 DIAGNOSIS — J449 Chronic obstructive pulmonary disease, unspecified: Secondary | ICD-10-CM | POA: Diagnosis not present

## 2022-06-29 DIAGNOSIS — E039 Hypothyroidism, unspecified: Secondary | ICD-10-CM | POA: Diagnosis not present

## 2022-06-29 DIAGNOSIS — I10 Essential (primary) hypertension: Secondary | ICD-10-CM | POA: Diagnosis not present

## 2022-06-29 DIAGNOSIS — E559 Vitamin D deficiency, unspecified: Secondary | ICD-10-CM

## 2022-06-29 MED ORDER — HYDROCODONE-ACETAMINOPHEN 5-325 MG PO TABS: ORAL_TABLET | ORAL | 0 refills | Status: DC

## 2022-06-29 NOTE — Progress Notes (Signed)
Patient ID: Diana [name], female    DOB: 19-Aug-1923, 89 y.o.   MRN: [id number]  This visit was conducted in person.  BP 120/80   Pulse 85   Temp 98 F (36.7 C) (Temporal)   Wt 155 lb (70.3 kg) Comment: Patient Reported  SpO2 97%   BMI 29.29 kg/m    CC:  Chief Complaint  Patient presents with  . Annual Exam    Part 2    Subjective:   HPI: Diana [name] is a 89 y.o. female presenting on 06/29/2022 for Annual Exam (Part 2)  The patient presents for annual medicare wellness, complete physical and review of chronic health problems. He/She also has the following acute concerns today:  Worsened swelling in bilateral legs worsened in last 6 months.  Despite lasix 40 mg daily.   She does nothing but sit.   The patient saw a LPN or RN for medicare wellness visit.  Prevention and wellness was reviewed in detail. Note reviewed and important notes copied below.  Hypertension:  At goal on amlodipine valsartan, metoprolol.   BP Readings from Last 3 Encounters:  06/29/22 120/80  04/13/22 (!) 104/58  03/03/22 104/70  Using medication without problems or lightheadedness:  occ  BP normal even if feeling dizzy  Feeling like falling backwards frequently, no clear room spinning. Chest pain with exertion:  occ at rest Edema:none Short of breath: none Average home BPs: Other issues:   Indication for chronic opioid:   Chronic leg and low back pain, essential wheelchair bound. Medication and dose: Hydrocodone 5/325 mg 1 tab po daily prn.. does not seem to help much more than tylenol on lower dose. # pills per month:  60 Last UDS date:   Not indicated in this patient Opioid Treatment Agreement signed (Y/N):Y Opioid Treatment Agreement last reviewed with patient:   y. t [facility] reviewed this encounter (include red flags):        COPD: stable control, no inhaler or oxygen requirement   Prediabetes due for re-eval.  Hypothyroid: due for re-eval.  Relevant past medical,  surgical, family and social history reviewed and updated as indicated. Interim medical history since our last visit reviewed. Allergies and medications reviewed and updated. Outpatient Medications Prior to Visit  Medication Sig Dispense Refill  . amLODipine-valsartan (EXFORGE) 10-160 MG tablet Take 1 tablet by mouth daily. 90 tablet 0  . furosemide (LASIX) 40 MG tablet Take 1 tablet (40 mg total) by mouth daily. 90 tablet 0  . HYDROcodone-acetaminophen (NORCO/VICODIN) 5-325 MG tablet TAKE 1 TO 2 TABLET BY MOUTH DAILY AS NEEDED FOR SEVERE PAIN 60 tablet 0  . levothyroxine (SYNTHROID) 100 MCG tablet TAKE 1 TABLET BY MOUTH ONCE A DAY 90 tablet 3  . metoprolol succinate (TOPROL-XL) 50 MG 24 hr tablet TAKE 1 TABLET BY MOUTH ONCE DAILY WITH OR IMMEDIATELY FOLLOWING A MEAL 90 tablet 3  . senna (SENOKOT) 8.6 MG TABS tablet Take 1 tablet by mouth daily as needed for mild constipation.    . traZODone (DESYREL) 50 MG tablet TAKE 1/2 TO 1 TABLET BY MOUTH AT BEDTIMEAS NEEDED FOR SLEEP 90 tablet 0   No facility-administered medications prior to visit.     Per HPI unless specifically indicated in ROS section below Review of Systems  Constitutional:  Negative for fatigue and fever.  HENT:  Negative for congestion.   Eyes:  Negative for pain.  Respiratory:  Negative for cough and shortness of breath.   Cardiovascular:  Negative for chest  pain, palpitations and leg swelling.  Gastrointestinal:  Negative for abdominal pain.  Genitourinary:  Negative for dysuria and vaginal bleeding.  Musculoskeletal:  Negative for back pain.  Neurological:  Negative for syncope, light-headedness and headaches.  Psychiatric/Behavioral:  Negative for dysphoric mood.    Objective:  BP 120/80   Pulse 85   Temp 98 F (36.7 C) (Temporal)   Wt 155 lb (70.3 kg) Comment: Patient Reported  SpO2 97%   BMI 29.29 kg/m   Wt Readings from Last 3 Encounters:  06/29/22 155 lb (70.3 kg)  06/10/22 152 lb (68.9 kg)  03/03/22 155 lb  (70.3 kg)      Physical Exam Vitals and nursing note reviewed.  Constitutional:      General: She is not in acute distress.    Appearance: Normal appearance. She is well-developed. She is obese. She is not ill-appearing or toxic-appearing.     Comments: In wheelchair in office  HENT:     Head: Normocephalic.     Right Ear: Hearing, tympanic membrane, ear canal and external ear normal.     Left Ear: Hearing, tympanic membrane, ear canal and external ear normal.     Nose: Nose normal.  Eyes:     General: Lids are normal. Lids are everted, no foreign bodies appreciated.     Conjunctiva/sclera: Conjunctivae normal.     Pupils: Pupils are equal, round, and reactive to light.  Neck:     Thyroid: No thyroid mass or thyromegaly.     Vascular: No carotid bruit.     Trachea: Trachea normal.  Cardiovascular:     Rate and Rhythm: Normal rate and regular rhythm.     Heart sounds: Normal heart sounds, S1 normal and S2 normal. No murmur heard.    No gallop.  Pulmonary:     Effort: Pulmonary effort is normal. No respiratory distress.     Breath sounds: Normal breath sounds. No wheezing, rhonchi or rales.  Abdominal:     General: Bowel sounds are normal. There is no distension or abdominal bruit.     Palpations: Abdomen is soft. There is no fluid wave or mass.     Tenderness: There is no abdominal tenderness. There is no guarding or rebound.     Hernia: No hernia is present.  Musculoskeletal:     Cervical back: Normal range of motion and neck supple.  Lymphadenopathy:     Cervical: No cervical adenopathy.  Skin:    General: Skin is warm and dry.     Findings: No rash.  Neurological:     Mental Status: She is alert.     Cranial Nerves: No cranial nerve deficit.     Sensory: No sensory deficit.  Psychiatric:        Mood and Affect: Mood is not anxious or depressed.        Speech: Speech normal.        Behavior: Behavior normal. Behavior is cooperative.        Judgment: Judgment normal.       Results for orders placed or performed in visit on 06/26/21  Hemoglobin A1c  Result Value Ref Range   Hgb A1c MFr Bld 6.0 (H) <5.7 % of total Hgb   Mean Plasma Glucose 126 mg/dL   eAG (mmol/L) 7.0 mmol/L  Comprehensive metabolic panel  Result Value Ref Range   Glucose, Bld 99 65 - 99 mg/dL   BUN 22 7 - 25 mg/dL   Creat 1.16 (H) 0.60 - 0.95 mg/dL  BUN/Creatinine Ratio 19 6 - 22 (calc)   Sodium 138 135 - 146 mmol/L   Potassium 4.1 3.5 - 5.3 mmol/L   Chloride 102 98 - 110 mmol/L   CO2 27 20 - 32 mmol/L   Calcium 9.4 8.6 - 10.4 mg/dL   Total Protein 7.3 6.1 - 8.1 g/dL   Albumin 4.2 3.6 - 5.1 g/dL   Globulin 3.1 1.9 - 3.7 g/dL (calc)   AG Ratio 1.4 1.0 - 2.5 (calc)   Total Bilirubin 0.4 0.2 - 1.2 mg/dL   Alkaline phosphatase (APISO) 90 37 - 153 U/L   AST 16 10 - 35 U/L   ALT 11 6 - 29 U/L  T4, free  Result Value Ref Range   Free T4 1.4 0.8 - 1.8 ng/dL  T3, free  Result Value Ref Range   T3, Free 2.9 2.3 - 4.2 pg/mL  TSH  Result Value Ref Range   TSH 0.81 0.40 - 4.50 mIU/L  CBC with Differential/Platelet  Result Value Ref Range   WBC 5.5 3.8 - 10.8 Thousand/uL   RBC 4.23 3.80 - 5.10 Million/uL   Hemoglobin 13.5 11.7 - 15.5 g/dL   HCT 39.6 35.0 - 45.0 %   MCV 93.6 80.0 - 100.0 fL   MCH 31.9 27.0 - 33.0 pg   MCHC 34.1 32.0 - 36.0 g/dL   RDW 12.9 11.0 - 15.0 %   Platelets 159 140 - 400 Thousand/uL   MPV 11.8 7.5 - 12.5 fL   Neutro Abs 3,311 1,500 - 7,800 cells/uL   Lymphs Abs 1,381 850 - 3,900 cells/uL   Absolute Monocytes 721 200 - 950 cells/uL   Eosinophils Absolute 61 15 - 500 cells/uL   Basophils Absolute 28 0 - 200 cells/uL   Neutrophils Relative % 60.2 %   Total Lymphocyte 25.1 %   Monocytes Relative 13.1 %   Eosinophils Relative 1.1 %   Basophils Relative 0.5 %  VITAMIN D 25 Hydroxy (Vit-D Deficiency, Fractures)  Result Value Ref Range   Vit D, 25-Hydroxy 30 30 - 100 ng/mL    This visit occurred during the SARS-CoV-2 public health emergency.  Safety  protocols were in place, including screening questions prior to the visit, additional usage of staff PPE, and extensive cleaning of exam room while observing appropriate contact time as indicated for disinfecting solutions.   COVID 19 screen:  No recent travel or known exposure to COVID19 The patient denies respiratory symptoms of COVID 19 at this time. The importance of social distancing was discussed today.   Assessment and Plan   The patient's preventative maintenance and recommended screening tests for an annual wellness exam were reviewed in full today. Brought up to date unless services declined.  Counselled on the importance of diet, exercise, and its role in overall health and mortality. The patient's FH and SH was reviewed, including their home life, tobacco status, and drug and alcohol status.   Vaccines: Uptodate with shingles and pneumonia vaccines, td, COVID x2 .. per pt got flu shot. Mammo: Nml 01/2011.Marland Kitchen Hx of breast cancer 1986.Marland Kitchen  She is not interested in further mammograms and breast exams given she would not do aggressive treatment. Colon: last 2003, no further indicated, no family history  DVE/pap: not indicated DXA: sister nml, no family history... Has never had. She refuses this at this time, again refused 2020, 2023, 2024  Decreased hearing, tinnitus  Problem List Items Addressed This Visit     Hypothyroidism   Prediabetes   Vitamin D  deficiency     No orders of the defined types were placed in this encounter.    Eliezer Lofts, MD

## 2022-06-29 NOTE — Addendum Note (Signed)
Addended by: Carter Kitten on: 06/29/2022 04:44 PM   Modules accepted: Orders

## 2022-06-30 DIAGNOSIS — R609 Edema, unspecified: Secondary | ICD-10-CM | POA: Insufficient documentation

## 2022-06-30 LAB — LIPID PANEL
Cholesterol: 136 mg/dL (ref 0–200)
HDL: 54.3 mg/dL (ref >39.00)
LDL Cholesterol: 67 mg/dL (ref 0–99)
NonHDL: 81.59
Total CHOL/HDL Ratio: 3
Triglycerides: 73 mg/dL (ref 0.0–149.0)
VLDL: 14.6 mg/dL (ref 0.0–40.0)

## 2022-06-30 LAB — TSH: TSH: 0.13 u[IU]/mL — ABNORMAL LOW (ref 0.35–5.50)

## 2022-06-30 LAB — COMPREHENSIVE METABOLIC PANEL
ALT: 11 U/L (ref 0–35)
AST: 14 U/L (ref 0–37)
Albumin: 3.7 g/dL (ref 3.5–5.2)
Alkaline Phosphatase: 84 U/L (ref 39–117)
BUN: 15 mg/dL (ref 6–23)
CO2: 27 mEq/L (ref 19–32)
Calcium: 9.3 mg/dL (ref 8.4–10.5)
Chloride: 102 mEq/L (ref 96–112)
Creatinine, Ser: 1.04 mg/dL (ref 0.40–1.20)
GFR: 45.35 mL/min — ABNORMAL LOW (ref >60.00)
Glucose, Bld: 108 mg/dL — ABNORMAL HIGH (ref 70–99)
Potassium: 3.8 mEq/L (ref 3.5–5.1)
Sodium: 138 mEq/L (ref 135–145)
Total Bilirubin: 0.5 mg/dL (ref 0.2–1.2)
Total Protein: 6.7 g/dL (ref 6.0–8.3)

## 2022-06-30 LAB — T3, FREE: T3, Free: 2.6 pg/mL (ref 2.3–4.2)

## 2022-06-30 LAB — T4, FREE: Free T4: 1.53 ng/dL (ref 0.60–1.60)

## 2022-06-30 LAB — BRAIN NATRIURETIC PEPTIDE: Pro B Natriuretic peptide (BNP): 756 pg/mL — ABNORMAL HIGH (ref 0.0–100.0)

## 2022-06-30 LAB — VITAMIN D 25 HYDROXY (VIT D DEFICIENCY, FRACTURES): VITD: 20.04 ng/mL — ABNORMAL LOW (ref 30.00–100.00)

## 2022-06-30 MED ORDER — VITAMIN D3 1.25 MG (50000 UT) PO TABS: 1.0000 | ORAL_TABLET | ORAL | 0 refills | Status: DC

## 2022-06-30 NOTE — Assessment & Plan Note (Signed)
Chronic, well controlled  ? ?Continue amlodipine/valsartan 10/160 mg daily, Lasix 40 mg p.o. daily as needed, metoprolol XL 50 mg p.o. daily ?

## 2022-06-30 NOTE — Addendum Note (Signed)
Addended by: Carter Kitten on: 06/30/2022 03:14 PM   Modules accepted: Orders

## 2022-06-30 NOTE — Assessment & Plan Note (Signed)
Due for reevaluation 

## 2022-06-30 NOTE — Assessment & Plan Note (Signed)
Acute severe worsening on top of chronic issue  Will evaluate for secondary causes. She is very inactive and I have encouraged her to elevate her feet above her heart when she is resting, prescription given for compression hose 15 to 20 mmHg compression. Continue Lasix 40 mg p.o. daily, may need to increase this dose depending on lab results.

## 2022-06-30 NOTE — Assessment & Plan Note (Signed)
Chronic, stable control on no inhaler.  She is pleased with her control. she is without oxygen requirement. ?

## 2022-07-01 ENCOUNTER — Other Ambulatory Visit: Payer: Self-pay | Admitting: Family Medicine

## 2022-07-01 DIAGNOSIS — R0609 Other forms of dyspnea: Secondary | ICD-10-CM

## 2022-07-08 ENCOUNTER — Other Ambulatory Visit: Payer: Self-pay | Admitting: Family Medicine

## 2022-07-08 ENCOUNTER — Telehealth: Payer: Self-pay | Admitting: Family Medicine

## 2022-07-08 MED ORDER — METOPROLOL SUCCINATE ER 50 MG PO TB24: ORAL_TABLET | ORAL | 3 refills | Status: DC

## 2022-07-08 NOTE — Telephone Encounter (Signed)
Refill sent as requested. 

## 2022-07-08 NOTE — Telephone Encounter (Signed)
Prescription Request  07/08/2022  LOV: 06/29/2022  What is the name of the medication or equipment?  metoprolol succinate (TOPROL-XL) 50 MG 24 hr tablet  Have you contacted your pharmacy to request a refill? Yes   Which pharmacy would you like this sent to?    [facility] [place] [address] Phone: [phone] Fax: [phone]    Patient notified that their request is being sent to the clinical staff for review and that they should receive a response within 2 business days.   Please advise at [facility] [phone]

## 2022-07-20 ENCOUNTER — Ambulatory Visit (INDEPENDENT_AMBULATORY_CARE_PROVIDER_SITE_OTHER): Payer: Medicare Other | Admitting: Family Medicine

## 2022-07-20 ENCOUNTER — Encounter: Payer: Self-pay | Admitting: Family Medicine

## 2022-07-20 VITALS — BP 90/62 | HR 87 | Temp 97.9°F | Ht 61.0 in | Wt 161.2 lb

## 2022-07-20 DIAGNOSIS — R600 Localized edema: Secondary | ICD-10-CM

## 2022-07-20 NOTE — Patient Instructions (Addendum)
Please stop at the lab to have labs drawn.  Keep appt for ECHO.

## 2022-07-20 NOTE — Progress Notes (Signed)
Patient ID: Diana [name], female    DOB: [birth year], 89 y.o.   MRN: [id number]  This visit was conducted in person.  BP 90/62   Pulse 87   Temp 97.9 F (36.6 C) (Temporal)   Ht 5\' 1"  (1.549 m)   Wt 161 lb 4 oz (73.1 kg)   SpO2 97%   BMI 30.47 kg/m    CC:  Chief Complaint  Patient presents with  . Leg Swelling    Subjective:   HPI: Diana [name] is a 89 y.o. female presenting on 07/20/2022 for Leg Swelling   Peripheral edema, evaluated at last office visit in March.  BNP was found to be 756. Recommended echocardiogram: scheduled 08/10/22 Increased lasix to 40 mg 2 tablets daily x 3 days Vit D was repleted.  All  Nml free t3 and free t4. Wt Readings from Last 3 Encounters:  07/20/22 161 lb 4 oz (73.1 kg)  06/29/22 155 lb (70.3 kg)  06/10/22 152 lb (68.9 kg)   HSe has been wearing compression hoe with zippers in last few days.  Left leg less swollen.   Occ chest pain intermittently for years, not new.  NO current SOB.   Per pt  minimal change in UOP .Marland Kitchen Minimal change in swelling per pt... di 2 tabs pf 40 mg x 3 days. BP tenuous.       Relevant past medical, surgical, family and social history reviewed and updated as indicated. Interim medical history since our last visit reviewed. Allergies and medications reviewed and updated. Outpatient Medications Prior to Visit  Medication Sig Dispense Refill  . amLODipine-valsartan (EXFORGE) 10-160 MG tablet Take 1 tablet by mouth daily. 90 tablet 0  . Cholecalciferol (VITAMIN D3) 1.25 MG (50000 UT) TABS Take 1 tablet by mouth every 7 (seven) days. 12 tablet 0  . furosemide (LASIX) 40 MG tablet Take 1 tablet (40 mg total) by mouth daily. (Patient taking differently: Take 80 mg by mouth daily.) 90 tablet 0  . HYDROcodone-acetaminophen (NORCO/VICODIN) 5-325 MG tablet TAKE 1 TO 2 TABLET BY MOUTH DAILY AS NEEDED FOR SEVERE PAIN 60 tablet 0  . levothyroxine (SYNTHROID) 100 MCG tablet TAKE 1 TABLET BY MOUTH ONCE A DAY 90  tablet 3  . metoprolol succinate (TOPROL-XL) 50 MG 24 hr tablet TAKE 1 TABLET BY MOUTH ONCE DAILY WITH OR IMMEDIATELY FOLLOWING A MEAL 90 tablet 3  . senna (SENOKOT) 8.6 MG TABS tablet Take 1 tablet by mouth daily as needed for mild constipation.    . traZODone (DESYREL) 50 MG tablet TAKE 1/2 TO 1 TABLET BY MOUTH AT BEDTIMEAS NEEDED FOR SLEEP 90 tablet 0   No facility-administered medications prior to visit.     Per HPI unless specifically indicated in ROS section below Review of Systems Objective:  BP 90/62   Pulse 87   Temp 97.9 F (36.6 C) (Temporal)   Ht 5\' 1"  (1.549 m)   Wt 161 lb 4 oz (73.1 kg)   SpO2 97%   BMI 30.47 kg/m   Wt Readings from Last 3 Encounters:  07/20/22 161 lb 4 oz (73.1 kg)  06/29/22 155 lb (70.3 kg)  06/10/22 152 lb (68.9 kg)      Physical Exam    Results for orders placed or performed in visit on 06/29/22  T3, free  Result Value Ref Range   T3, Free 2.6 2.3 - 4.2 pg/mL  T4, free  Result Value Ref Range   Free T4 1.53 0.60 - 1.60  ng/dL  TSH  Result Value Ref Range   TSH 0.13 (L) 0.35 - 5.50 uIU/mL  Comprehensive metabolic panel  Result Value Ref Range   Sodium 138 135 - 145 mEq/L   Potassium 3.8 3.5 - 5.1 mEq/L   Chloride 102 96 - 112 mEq/L   CO2 27 19 - 32 mEq/L   Glucose, Bld 108 (H) 70 - 99 mg/dL   BUN 15 6 - 23 mg/dL   Creatinine, Ser 1.04 0.40 - 1.20 mg/dL   Total Bilirubin 0.5 0.2 - 1.2 mg/dL   Alkaline Phosphatase 84 39 - 117 U/L   AST 14 0 - 37 U/L   ALT 11 0 - 35 U/L   Total Protein 6.7 6.0 - 8.3 g/dL   Albumin 3.7 3.5 - 5.2 g/dL   GFR 45.35 (L) >60.00 mL/min   Calcium 9.3 8.4 - 10.5 mg/dL  Lipid panel  Result Value Ref Range   Cholesterol 136 0 - 200 mg/dL   Triglycerides 73.0 0.0 - 149.0 mg/dL   HDL 54.30 >39.00 mg/dL   VLDL 14.6 0.0 - 40.0 mg/dL   LDL Cholesterol 67 0 - 99 mg/dL   Total CHOL/HDL Ratio 3    NonHDL 81.59   VITAMIN D 25 Hydroxy (Vit-D Deficiency, Fractures)  Result Value Ref Range   VITD 20.04 (L)  30.00 - 100.00 ng/mL  Brain natriuretic peptide  Result Value Ref Range   Pro B Natriuretic peptide (BNP) 756.0 (H) 0.0 - 100.0 pg/mL    Assessment and Plan  There are no diagnoses linked to this encounter.  No follow-ups on file.   Eliezer Lofts, MD

## 2022-07-21 ENCOUNTER — Other Ambulatory Visit: Payer: Self-pay | Admitting: Family Medicine

## 2022-07-21 LAB — BASIC METABOLIC PANEL
BUN: 16 mg/dL (ref 6–23)
CO2: 28 mEq/L (ref 19–32)
Calcium: 9.8 mg/dL (ref 8.4–10.5)
Chloride: 98 mEq/L (ref 96–112)
Creatinine, Ser: 1.21 mg/dL — ABNORMAL HIGH (ref 0.40–1.20)
GFR: 37.8 mL/min — ABNORMAL LOW (ref >60.00)
Glucose, Bld: 107 mg/dL — ABNORMAL HIGH (ref 70–99)
Potassium: 3.9 mEq/L (ref 3.5–5.1)
Sodium: 135 mEq/L (ref 135–145)

## 2022-07-21 MED ORDER — TORSEMIDE 20 MG PO TABS: 20.0000 mg | ORAL_TABLET | Freq: Every day | ORAL | 0 refills | Status: DC

## 2022-07-21 NOTE — Assessment & Plan Note (Signed)
Chronic, interval improvement of acute worsening.  She has now been able to wear compression hose with zippers which has helped significantly especially in the left leg.  No current sign of bacterial infection. Most likely secondary to venous insufficiency in addition to possible congestive heart failure.  Per patient minimal improvement with Lasix 40 mg 2 tablets daily. Echocardiogram pending next few weeks.  Will evaluate with basic metabolic panel to reassess kidney function and electrolytes. Will consider changing to torsemide given minimal diuresis with increased dose of furosemide.

## 2022-08-10 ENCOUNTER — Ambulatory Visit: Payer: Medicare Other

## 2022-08-18 ENCOUNTER — Telehealth: Payer: Self-pay | Admitting: Family Medicine

## 2022-08-18 MED ORDER — LEVOTHYROXINE SODIUM 100 MCG PO TABS: 100.0000 ug | ORAL_TABLET | Freq: Every day | ORAL | 2 refills | Status: DC

## 2022-08-18 MED ORDER — TORSEMIDE 20 MG PO TABS: 20.0000 mg | ORAL_TABLET | Freq: Every day | ORAL | 0 refills | Status: DC

## 2022-08-18 NOTE — Telephone Encounter (Signed)
Prescription Request  08/18/2022  LOV: 07/20/2022  What is the name of the medication or equipment? levothyroxine (SYNTHROID) 100 MCG tablet   Have you contacted your pharmacy to request a refill? No   Which pharmacy would you like this sent to?   [facility] - [place], [place] - [address] [place] [address] Phone: [phone] Fax: [phone]    Patient notified that their request is being sent to the clinical staff for review and that they should receive a response within 2 business days.   Please advise at Mobile [phone] (mobile)

## 2022-08-18 NOTE — Telephone Encounter (Signed)
Refill sent to pharmacy per request.  Spoke to patient's son Bruce by telephone and was advised that Dr. Ermalene Searing had made some changes back to his mom's fluid pill about a month ago for a week. Bruce stated that change in the fluid pill helped his mom get rid of lots of fluid while on the different medication. Patient's son stated that the different fluid pill caused his mom to urinate a lot. Patient's son stated after a week his mom went back on her Lasix 40 mg pill daily. Bruce stated that his mom still has swelling and blisters on both of her legs. Bruce stated that the blisters have popped and are draining but do not seem to be inflamed. Bruce stated that he wanted to let Dr. Ermalene Searing know that the change in the fluid pill for a week did help a lot and is wondering if she needed to be on it longer than a week. Patient's son stated that he is not sure of the name of the pill that she was put on for a week. Bruce wanted to let Dr. Ermalene Searing know that they had to cancel her ECHO because his mom was not feeling good and it has been rescheduled for 09/07/22. Patient's son wants to know when his mom needs to come back and see Dr. Ermalene Searing?

## 2022-08-18 NOTE — Telephone Encounter (Signed)
Patient's son notified as instructed by telephone and verbalized understanding. Appointment scheduled with Dr. Ermalene Searing 08/27/22 at 3:20 pm.

## 2022-08-18 NOTE — Telephone Encounter (Signed)
I will change her to torsemide 20 mg daily in place of furosemide.  Have her make a follow up after 1 week for follow up on swelling.

## 2022-08-18 NOTE — Telephone Encounter (Signed)
Pt son Smitty Cords called in  requesting a call back to discuss pt change in medication #[phone]

## 2022-08-27 ENCOUNTER — Ambulatory Visit (INDEPENDENT_AMBULATORY_CARE_PROVIDER_SITE_OTHER): Payer: Medicare Other | Admitting: Family Medicine

## 2022-08-27 ENCOUNTER — Encounter: Payer: Self-pay | Admitting: Family Medicine

## 2022-08-27 VITALS — BP 90/60 | HR 122 | Temp 97.6°F | Ht 61.0 in | Wt 171.2 lb

## 2022-08-27 DIAGNOSIS — R600 Localized edema: Secondary | ICD-10-CM | POA: Diagnosis not present

## 2022-08-27 DIAGNOSIS — L089 Local infection of the skin and subcutaneous tissue, unspecified: Secondary | ICD-10-CM

## 2022-08-27 DIAGNOSIS — L97919 Non-pressure chronic ulcer of unspecified part of right lower leg with unspecified severity: Secondary | ICD-10-CM

## 2022-08-27 DIAGNOSIS — L98491 Non-pressure chronic ulcer of skin of other sites limited to breakdown of skin: Secondary | ICD-10-CM | POA: Diagnosis not present

## 2022-08-27 DIAGNOSIS — I83019 Varicose veins of right lower extremity with ulcer of unspecified site: Secondary | ICD-10-CM | POA: Diagnosis not present

## 2022-08-27 DIAGNOSIS — L97929 Non-pressure chronic ulcer of unspecified part of left lower leg with unspecified severity: Secondary | ICD-10-CM

## 2022-08-27 DIAGNOSIS — I83029 Varicose veins of left lower extremity with ulcer of unspecified site: Secondary | ICD-10-CM | POA: Diagnosis not present

## 2022-08-27 DIAGNOSIS — L03119 Cellulitis of unspecified part of limb: Secondary | ICD-10-CM | POA: Insufficient documentation

## 2022-08-27 LAB — BASIC METABOLIC PANEL
BUN/Creatinine Ratio: 15 (calc) (ref 6–22)
BUN: 19 mg/dL (ref 7–25)
CO2: 26 mmol/L (ref 20–32)
Calcium: 9 mg/dL (ref 8.6–10.4)
Chloride: 98 mmol/L (ref 98–110)
Creat: 1.27 mg/dL — ABNORMAL HIGH (ref 0.60–0.95)
Glucose, Bld: 108 mg/dL — ABNORMAL HIGH (ref 65–99)
Potassium: 4.4 mmol/L (ref 3.5–5.3)
Sodium: 135 mmol/L (ref 135–146)

## 2022-08-27 MED ORDER — CEFTRIAXONE SODIUM 1 G IJ SOLR
1.0000 g | Freq: Once | INTRAMUSCULAR | Status: AC
Start: 2022-08-27 — End: 2022-08-27
  Administered 2022-08-27: 1 g via INTRAMUSCULAR

## 2022-08-27 MED ORDER — DOXYCYCLINE HYCLATE 100 MG PO TABS: 100.0000 mg | ORAL_TABLET | Freq: Two times a day (BID) | ORAL | 0 refills | Status: DC

## 2022-08-27 NOTE — Addendum Note (Signed)
Addended by: Damita Lack on: 08/27/2022 04:13 PM   Modules accepted: Orders

## 2022-08-27 NOTE — Progress Notes (Signed)
Patient ID: Diana [name], female    DOB: Sep 05, 1923, 89 y.o.   MRN: [id number]  This visit was conducted in person.  BP 90/60 (BP Location: Left Arm, Patient Position: Sitting, Cuff Size: Normal)   Pulse (!) 122   Temp 97.6 F (36.4 C) (Temporal)   Ht 5\' 1"  (1.549 m)   Wt 171 lb 4 oz (77.7 kg)   SpO2 96%   BMI 32.36 kg/m    CC:  Chief Complaint  Patient presents with   Peripheral  edema    Subjective:   HPI: Diana [name] is a 89 y.o. female presenting on 08/27/2022 for Peripheral  edema   Peripheral edema,BNP  in past was found to be 756. Recommended echocardiogram: scheduled 08/10/22.. never went... reschedule 5/21  We have started her on torsemide 20 mg daily  since 08/18/2022 given minimal benefit from lasix  high dose.  Has noted more  increase urine output. But more cle alright well thanks for r yellowish drainage.  No pain.... But she also states she has pain all over. Son has difficulty getting her to allow him to change dressing and apply/clean wounds.  Wound seem to be draining more, yellowish discharge.  New odor. No fever.    Wt Readings from Last 3 Encounters:  08/27/22 171 lb 4 oz (77.7 kg)  07/20/22 161 lb 4 oz (73.1 kg)  06/29/22 155 lb (70.3 kg)  She has been wearing compression hoe with zippers .Marland Kitchen  But wears them unzipped partially.   Occ chest pain intermittently for years, not new.  No current SOB.     Relevant past medical, surgical, family and social history reviewed and updated as indicated. Interim medical history since our last visit reviewed. Allergies and medications reviewed and updated. Outpatient Medications Prior to Visit  Medication Sig Dispense Refill   amLODipine-valsartan (EXFORGE) 10-160 MG tablet Take 1 tablet by mouth daily. 90 tablet 0   Cholecalciferol (VITAMIN D3) 1.25 MG (50000 UT) TABS Take 1 tablet by mouth every 7 (seven) days. 12 tablet 0   HYDROcodone-acetaminophen (NORCO/VICODIN) 5-325 MG tablet TAKE 1 TO 2  TABLET BY MOUTH DAILY AS NEEDED FOR SEVERE PAIN 60 tablet 0   levothyroxine (SYNTHROID) 100 MCG tablet Take 1 tablet (100 mcg total) by mouth daily. 90 tablet 2   metoprolol succinate (TOPROL-XL) 50 MG 24 hr tablet TAKE 1 TABLET BY MOUTH ONCE DAILY WITH OR IMMEDIATELY FOLLOWING A MEAL 90 tablet 3   senna (SENOKOT) 8.6 MG TABS tablet Take 1 tablet by mouth daily as needed for mild constipation.     torsemide (DEMADEX) 20 MG tablet Take 1 tablet (20 mg total) by mouth daily. 30 tablet 0   traZODone (DESYREL) 50 MG tablet TAKE 1/2 TO 1 TABLET BY MOUTH AT BEDTIMEAS NEEDED FOR SLEEP 90 tablet 0   No facility-administered medications prior to visit.     Per HPI unless specifically indicated in ROS section below Review of Systems  Constitutional:  Negative for fatigue and fever.  HENT:  Negative for congestion.   Eyes:  Negative for pain.  Respiratory:  Negative for cough and shortness of breath.   Cardiovascular:  Positive for leg swelling. Negative for chest pain and palpitations.  Gastrointestinal:  Negative for abdominal pain.  Genitourinary:  Negative for dysuria and vaginal bleeding.  Musculoskeletal:  Negative for back pain.  Neurological:  Negative for syncope, light-headedness and headaches.  Psychiatric/Behavioral:  Negative for dysphoric mood.    Objective:  BP 90/60 (  BP Location: Left Arm, Patient Position: Sitting, Cuff Size: Normal)   Pulse (!) 122   Temp 97.6 F (36.4 C) (Temporal)   Ht 5\' 1"  (1.549 m)   Wt 171 lb 4 oz (77.7 kg)   SpO2 96%   BMI 32.36 kg/m   Wt Readings from Last 3 Encounters:  08/27/22 171 lb 4 oz (77.7 kg)  07/20/22 161 lb 4 oz (73.1 kg)  06/29/22 155 lb (70.3 kg)      Physical Exam Constitutional:      General: She is not in acute distress.    Appearance: Normal appearance. She is well-developed. She is not ill-appearing or toxic-appearing.  HENT:     Head: Normocephalic.     Right Ear: Hearing, tympanic membrane, ear canal and external ear  normal. Tympanic membrane is not erythematous, retracted or bulging.     Left Ear: Hearing, tympanic membrane, ear canal and external ear normal. Tympanic membrane is not erythematous, retracted or bulging.     Nose: No mucosal edema or rhinorrhea.     Right Sinus: No maxillary sinus tenderness or frontal sinus tenderness.     Left Sinus: No maxillary sinus tenderness or frontal sinus tenderness.     Mouth/Throat:     Pharynx: Uvula midline.  Eyes:     General: Lids are normal. Lids are everted, no foreign bodies appreciated.     Conjunctiva/sclera: Conjunctivae normal.     Pupils: Pupils are equal, round, and reactive to light.  Neck:     Thyroid: No thyroid mass or thyromegaly.     Vascular: No carotid bruit.     Trachea: Trachea normal.  Cardiovascular:     Rate and Rhythm: Normal rate and regular rhythm.     Pulses: Normal pulses.     Heart sounds: Normal heart sounds, S1 normal and S2 normal. No murmur heard.    No friction rub. No gallop.  Pulmonary:     Effort: Pulmonary effort is normal. No tachypnea or respiratory distress.     Breath sounds: Normal breath sounds. No decreased breath sounds, wheezing, rhonchi or rales.  Abdominal:     General: Bowel sounds are normal.     Palpations: Abdomen is soft.     Tenderness: There is no abdominal tenderness.  Musculoskeletal:     Cervical back: Normal range of motion and neck supple.     Right lower leg: Edema present.     Left lower leg: Edema present.  Skin:    General: Skin is warm and dry.     Findings: No rash.     Comments: See phot 2 skin lesions 1 left anterior lower leg 1 right posterior lower leg both with odor and copious exudate draining/weeping clear liquid  Neurological:     Mental Status: She is alert.  Psychiatric:        Mood and Affect: Mood is not anxious or depressed.        Speech: Speech normal.        Behavior: Behavior normal. Behavior is cooperative.        Thought Content: Thought content normal.         Judgment: Judgment normal.          Results for orders placed or performed in visit on 07/20/22  Basic Metabolic Panel  Result Value Ref Range   Sodium 135 135 - 145 mEq/L   Potassium 3.9 3.5 - 5.1 mEq/L   Chloride 98 96 - 112 mEq/L   CO2  28 19 - 32 mEq/L   Glucose, Bld 107 (H) 70 - 99 mg/dL   BUN 16 6 - 23 mg/dL   Creatinine, Ser 6.04 (H) 0.40 - 1.20 mg/dL   GFR 54.09 (L) >81.19 mL/min   Calcium 9.8 8.4 - 10.5 mg/dL    Assessment and Plan  Peripheral edema -     Basic metabolic panel -     AMB referral to wound care center -     Ambulatory referral to Home Health  Venous stasis ulcers of both lower extremities (HCC) -     AMB referral to wound care center -     Ambulatory referral to Home Health  Infected skin ulcer limited to breakdown of skin (HCC) -     AMB referral to wound care center -     Ambulatory referral to Home Health  Other orders -     Doxycycline Hyclate; Take 1 tablet (100 mg total) by mouth 2 (two) times daily.  Dispense: 20 tablet; Refill: 0  Bilateral venous stasis ulcers now with likely bacterial infection.  Minimal improvement and peripheral swelling with torsemide.  Will send her to lab for basic metabolic panel to check electrolytes and kidney function.  We may be able to try to increase this.  She does have echo pending but is keeps getting put off given patient does not want to go to the appointment.  I have high level concern for congestive heart failure.  Minimally compliant with compression and elevation.  She likely would benefit from [employer] boot once infection resolved.  She needs assistance with wound care at home.  Will place stat referral to wound care center.  Start with ceftriaxone 1 g x 1 today and begin doxycycline 100 mg p.o. twice daily to cover for methicillin-resistant staph. Will place referral for better home health nurse to help with wound care and dressing. Bilateral wounds dressed with absorbable dressing today and Kerlix  wrap.  Return and ER precautions provided. No follow-ups on file.   Kerby Nora, MD

## 2022-08-27 NOTE — Patient Instructions (Addendum)
Elevate leg.  Continue torsemide 20 mg daily. Please stop at the lab to have labs drawn.  I will make referral to [facility] for wound care.  Referral to wound care clinic.Marland Kitchen [place] preferred.  Complete antibiotics dox  Keep appt for ECHO.

## 2022-08-27 NOTE — Addendum Note (Signed)
Addended by: Alvina Chou on: 08/27/2022 04:09 PM   Modules accepted: Orders

## 2022-08-31 ENCOUNTER — Other Ambulatory Visit: Payer: Self-pay | Admitting: *Deleted

## 2022-08-31 MED ORDER — TORSEMIDE 20 MG PO TABS: 20.0000 mg | ORAL_TABLET | Freq: Every day | ORAL | 2 refills | Status: DC

## 2022-09-01 ENCOUNTER — Encounter: Payer: Medicare Other | Attending: Internal Medicine | Admitting: Internal Medicine

## 2022-09-01 DIAGNOSIS — L03116 Cellulitis of left lower limb: Secondary | ICD-10-CM | POA: Diagnosis not present

## 2022-09-01 DIAGNOSIS — Z7901 Long term (current) use of anticoagulants: Secondary | ICD-10-CM | POA: Diagnosis not present

## 2022-09-01 DIAGNOSIS — L0390 Cellulitis, unspecified: Secondary | ICD-10-CM | POA: Diagnosis not present

## 2022-09-01 DIAGNOSIS — L97822 Non-pressure chronic ulcer of other part of left lower leg with fat layer exposed: Secondary | ICD-10-CM | POA: Diagnosis not present

## 2022-09-01 DIAGNOSIS — E039 Hypothyroidism, unspecified: Secondary | ICD-10-CM | POA: Diagnosis not present

## 2022-09-01 DIAGNOSIS — I4819 Other persistent atrial fibrillation: Secondary | ICD-10-CM | POA: Diagnosis not present

## 2022-09-01 DIAGNOSIS — Z87891 Personal history of nicotine dependence: Secondary | ICD-10-CM | POA: Diagnosis not present

## 2022-09-01 DIAGNOSIS — I509 Heart failure, unspecified: Secondary | ICD-10-CM | POA: Diagnosis not present

## 2022-09-01 DIAGNOSIS — I87313 Chronic venous hypertension (idiopathic) with ulcer of bilateral lower extremity: Secondary | ICD-10-CM

## 2022-09-01 DIAGNOSIS — B965 Pseudomonas (aeruginosa) (mallei) (pseudomallei) as the cause of diseases classified elsewhere: Secondary | ICD-10-CM | POA: Diagnosis not present

## 2022-09-01 DIAGNOSIS — I4891 Unspecified atrial fibrillation: Secondary | ICD-10-CM | POA: Diagnosis not present

## 2022-09-01 DIAGNOSIS — J90 Pleural effusion, not elsewhere classified: Secondary | ICD-10-CM | POA: Diagnosis not present

## 2022-09-01 DIAGNOSIS — Z853 Personal history of malignant neoplasm of breast: Secondary | ICD-10-CM | POA: Diagnosis not present

## 2022-09-01 DIAGNOSIS — J4489 Other specified chronic obstructive pulmonary disease: Secondary | ICD-10-CM | POA: Diagnosis not present

## 2022-09-01 DIAGNOSIS — G2581 Restless legs syndrome: Secondary | ICD-10-CM | POA: Diagnosis not present

## 2022-09-01 DIAGNOSIS — M7989 Other specified soft tissue disorders: Secondary | ICD-10-CM | POA: Diagnosis not present

## 2022-09-01 DIAGNOSIS — S5012XA Contusion of left forearm, initial encounter: Secondary | ICD-10-CM | POA: Diagnosis not present

## 2022-09-01 DIAGNOSIS — I420 Dilated cardiomyopathy: Secondary | ICD-10-CM | POA: Diagnosis not present

## 2022-09-01 DIAGNOSIS — R0602 Shortness of breath: Secondary | ICD-10-CM | POA: Diagnosis not present

## 2022-09-01 DIAGNOSIS — I110 Hypertensive heart disease with heart failure: Secondary | ICD-10-CM | POA: Diagnosis not present

## 2022-09-01 DIAGNOSIS — H9190 Unspecified hearing loss, unspecified ear: Secondary | ICD-10-CM | POA: Diagnosis not present

## 2022-09-01 DIAGNOSIS — J449 Chronic obstructive pulmonary disease, unspecified: Secondary | ICD-10-CM

## 2022-09-01 DIAGNOSIS — Z8661 Personal history of infections of the central nervous system: Secondary | ICD-10-CM | POA: Diagnosis not present

## 2022-09-01 DIAGNOSIS — I080 Rheumatic disorders of both mitral and aortic valves: Secondary | ICD-10-CM | POA: Diagnosis not present

## 2022-09-01 DIAGNOSIS — I2489 Other forms of acute ischemic heart disease: Secondary | ICD-10-CM | POA: Diagnosis not present

## 2022-09-01 DIAGNOSIS — L97812 Non-pressure chronic ulcer of other part of right lower leg with fat layer exposed: Secondary | ICD-10-CM

## 2022-09-01 DIAGNOSIS — Z9011 Acquired absence of right breast and nipple: Secondary | ICD-10-CM | POA: Diagnosis not present

## 2022-09-01 DIAGNOSIS — R079 Chest pain, unspecified: Secondary | ICD-10-CM | POA: Diagnosis not present

## 2022-09-01 DIAGNOSIS — I5023 Acute on chronic systolic (congestive) heart failure: Secondary | ICD-10-CM | POA: Diagnosis not present

## 2022-09-01 DIAGNOSIS — I959 Hypotension, unspecified: Secondary | ICD-10-CM | POA: Diagnosis not present

## 2022-09-01 DIAGNOSIS — I272 Pulmonary hypertension, unspecified: Secondary | ICD-10-CM | POA: Diagnosis not present

## 2022-09-01 DIAGNOSIS — Z66 Do not resuscitate: Secondary | ICD-10-CM | POA: Diagnosis not present

## 2022-09-01 DIAGNOSIS — Z79899 Other long term (current) drug therapy: Secondary | ICD-10-CM | POA: Diagnosis not present

## 2022-09-01 DIAGNOSIS — L03115 Cellulitis of right lower limb: Secondary | ICD-10-CM | POA: Diagnosis not present

## 2022-09-01 NOTE — Progress Notes (Signed)
TKIA, BOLERJACK ([id number]) [phone] [id number] Page 1 of 4 Visit Report for 09/01/2022 Abuse Risk Screen Details Patient Name: Date of Service: Diana [name], Diana [name] 09/01/2022 2:30 PM Medical Record Number: [id number] Patient Account Number: [id number] Date of Birth/Sex: Treating RN: 1923-11-27 (89 y.o. Female) [facility] Devarius Nelles: Kerby Nora Other Clinician: Referring Ambyr Qadri: Treating Jenevie Casstevens/Extender: Madie Reno, Amy Weeks in Treatment: 0 Abuse Risk Screen Items Answer ABUSE RISK SCREEN: Has anyone close to you tried to hurt or harm you recentlyo No Do you feel uncomfortable with anyone in your familyo No Has anyone forced you do things that you didnt want to doo No Electronic Signature(s) Signed: 09/01/2022 4:35:16 PM By: Elliot Gurney, BSN, RN, CWS, Kim RN, BSN Entered By: Elliot Gurney, BSN, RN, CWS, Kim on 09/01/2022 14:47:20 -------------------------------------------------------------------------------- Activities of Daily Living Details Patient Name: Date of Service: Diana [name], Diana [name] 09/01/2022 2:30 PM Medical Record Number: [id number] Patient Account Number: [id number] Date of Birth/Sex: Treating RN: Jun 06, 1923 (89 y.o. Female) [facility] Jacques Fife: Kerby Nora Other Clinician: Referring Maurisio Ruddy: Treating Nery Kalisz/Extender: Madie Reno, Amy Weeks in Treatment: 0 Activities of Daily Living Items Answer Activities of Daily Living (Please select one for each item) Drive Automobile Not Able T Medications ake Need Assistance Use T elephone Need Assistance Care for Appearance Need Assistance Use T oilet Need Assistance Bath / Shower Need Assistance Dress Self Need Assistance Feed Self Need Assistance Walk Need Assistance Get In / Out Bed Need Assistance Housework Need Assistance Prepare Meals Need Assistance Handle Money Need Assistance Shop for Self Need Assistance Electronic  Signature(s) Signed: 09/01/2022 4:35:16 PM By: Elliot Gurney, BSN, RN, CWS, Kim RN, BSN Entered By: Elliot Gurney, BSN, RN, CWS, Kim on 09/01/2022 14:48:00 -------------------------------------------------------------------------------- Education Screening Details Patient Name: Date of Service: Diana [name], Diana [name] 09/01/2022 2:30 PM Medical Record Number: [id number] Patient Account Number: [id number] Date of Birth/Sex: Treating RN: Sep 14, 1923 (89 y.o. Female) [facility] Kristoph Sattler: Kerby Nora Other Clinician: Referring Blanch Stang: Treating Montez Stryker/Extender: Madie Reno, Amy Weeks in TreatmentSTEVE, SCHEIBLE ([id number]) [phone] [id number] Page 2 of 4 Primary Learner Assessed: Patient Learning Preferences/Education Level/Primary Language Learning Preference: Explanation, Demonstration Preferred Language: English Cognitive Barrier Language Barrier: No Translator Needed: No Knowledge/Comprehension Knowledge Level: High Comprehension Level: High Ability to understand written instructions: High Ability to understand verbal instructions: High Motivation Anxiety Level: Calm Cooperation: Cooperative Education Importance: Acknowledges Need Interest in Health Problems: Asks Questions Perception: Coherent Willingness to Engage in Self-Management High Activities: Readiness to Engage in Self-Management High Activities: Psychologist, prison and probation services) Signed: 09/01/2022 4:35:16 PM By: Elliot Gurney, BSN, RN, CWS, Kim RN, BSN Entered By: Elliot Gurney, BSN, RN, CWS, Kim on 09/01/2022 14:48:26 -------------------------------------------------------------------------------- Fall Risk Assessment Details Patient Name: Date of Service: Diana [name] 09/01/2022 2:30 PM Medical Record Number: [id number] Patient Account Number: [id number] Date of Birth/Sex: Treating RN: 02/01/1924 (89 y.o. Female) [facility] Janayla Marik: Kerby Nora Other Clinician: Referring  Envy Meno: Treating Yamileth Hayse/Extender: Madie Reno, Amy Weeks in Treatment: 0 Fall Risk Assessment Items Have you had 2 or more falls in the last 12 monthso 0 No Have you had any fall that resulted in injury in the last 12 monthso 0 No FALLS RISK SCREEN History of falling - immediate or within 3 months 0 No Secondary diagnosis (Do you have 2 or more medical diagnoseso) 15 Yes Ambulatory aid None/bed rest/wheelchair/nurse 0 No Crutches/cane/walker 15 Yes Furniture 0 No Intravenous therapy Access/Saline/Heparin Lock 0 No Gait/Transferring Normal/ bed rest/ wheelchair 0 No Weak (  short steps with or without shuffle, stooped but able to lift head while walking, may seek 0 No support from furniture) Impaired (short steps with shuffle, may have difficulty arising from chair, head down, impaired 20 Yes balance) Mental Status Oriented to own ability 0 Yes Electronic Signature(s) Signed: 09/01/2022 4:35:16 PM By: Elliot Gurney, BSN, RN, CWS, Kim RN, BSN Entered By: Elliot Gurney, BSN, RN, CWS, Kim on 09/01/2022 14:49:28 Saddie Benders ([id number]) [id number] Page 3 of 4 -------------------------------------------------------------------------------- Foot Assessment Details Patient Name: Date of Service: Diana [name], Diana [name] 09/01/2022 2:30 PM Medical Record Number: [id number] Patient Account Number: [id number] Date of Birth/Sex: Treating RN: 04-19-1924 (89 y.o. Female) [facility] Inaaya Vellucci: Kerby Nora Other Clinician: Referring Sadira Standard: Treating Zoi Devine/Extender: Madie Reno, Amy Weeks in Treatment: 0 Foot Assessment Items Site Locations + = Sensation present, - = Sensation absent, C = Callus, U = Ulcer R = Redness, W = Warmth, M = Maceration, PU = Pre-ulcerative lesion F = Fissure, S = Swelling, D = Dryness Assessment Right: Left: Other Deformity: No No Prior Foot Ulcer: No No Prior Amputation: No No Charcot Joint: No  No Ambulatory Status: Ambulatory With Help Assistance Device: Walker Gait: Surveyor, mining) Signed: 09/01/2022 4:35:16 PM By: Elliot Gurney, BSN, RN, CWS, Kim RN, BSN Entered By: Elliot Gurney, BSN, RN, CWS, Kim on 09/01/2022 14:50:08 -------------------------------------------------------------------------------- Nutrition Risk Screening Details Patient Name: Date of Service: Diana [name], Diana [name] 09/01/2022 2:30 PM Medical Record Number: [id number] Patient Account Number: [id number] Date of Birth/Sex: Treating RN: [birth year] (89 y.o. Female) [facility] Vincie Linn: Kerby Nora Other Clinician: Referring Wyoma Genson: Treating Umaima Scholten/Extender: Madie Reno, Amy Weeks in Treatment: 0 Height (in): 64 Weight (lbs): 170 Body Mass Index (BMI): 29.2 Nutrition Risk Screening Items Score Screening NUTRITION RISK SCREEN: I have an illness or condition that made me change the kind and/or amount of food I eat 0 No Polack, Kyrianna C ([id number]) [id number] Page 4 of 4 I eat fewer than two meals per day 0 No I eat few fruits and vegetables, or milk products 0 No I have three or more drinks of beer, liquor or wine almost every day 0 No I have tooth or mouth problems that make it hard for me to eat 0 No I don't always have enough money to buy the food I need 0 No I eat alone most of the time 0 No I take three or more different prescribed or over-the-counter drugs a day 1 Yes Without wanting to, I have lost or gained 10 pounds in the last six months 0 No I am not always physically able to shop, cook and/or feed myself 0 No Nutrition Protocols Good Risk Protocol 0 No interventions needed Moderate Risk Protocol High Risk Proctocol Risk Level: Good Risk Score: 1 Electronic Signature(s) Signed: 09/01/2022 4:35:16 PM By: Elliot Gurney, BSN, RN, CWS, Kim RN, BSN Entered By: Elliot Gurney, BSN, RN, CWS, Kim on 09/01/2022 14:49:55

## 2022-09-03 ENCOUNTER — Inpatient Hospital Stay
Admission: EM | Admit: 2022-09-03 | Discharge: 2022-09-10 | DRG: 602 | Disposition: A | Discharge status: Skilled Nursing Facility | Payer: Medicare Other | Source: Ambulatory Visit | Attending: Student in an Organized Health Care Education/Training Program | Admitting: Student in an Organized Health Care Education/Training Program

## 2022-09-03 ENCOUNTER — Encounter: Payer: Medicare Other | Admitting: Physician Assistant

## 2022-09-03 ENCOUNTER — Other Ambulatory Visit: Payer: Self-pay

## 2022-09-03 ENCOUNTER — Emergency Department: Payer: Medicare Other

## 2022-09-03 ENCOUNTER — Inpatient Hospital Stay: Payer: Medicare Other

## 2022-09-03 DIAGNOSIS — Z853 Personal history of malignant neoplasm of breast: Secondary | ICD-10-CM

## 2022-09-03 DIAGNOSIS — I110 Hypertensive heart disease with heart failure: Secondary | ICD-10-CM | POA: Diagnosis not present

## 2022-09-03 DIAGNOSIS — Z79899 Other long term (current) drug therapy: Secondary | ICD-10-CM

## 2022-09-03 DIAGNOSIS — R0609 Other forms of dyspnea: Secondary | ICD-10-CM | POA: Diagnosis not present

## 2022-09-03 DIAGNOSIS — L03119 Cellulitis of unspecified part of limb: Secondary | ICD-10-CM | POA: Diagnosis not present

## 2022-09-03 DIAGNOSIS — I5023 Acute on chronic systolic (congestive) heart failure: Secondary | ICD-10-CM | POA: Diagnosis not present

## 2022-09-03 DIAGNOSIS — I080 Rheumatic disorders of both mitral and aortic valves: Secondary | ICD-10-CM | POA: Diagnosis present

## 2022-09-03 DIAGNOSIS — I272 Pulmonary hypertension, unspecified: Secondary | ICD-10-CM | POA: Diagnosis present

## 2022-09-03 DIAGNOSIS — B965 Pseudomonas (aeruginosa) (mallei) (pseudomallei) as the cause of diseases classified elsewhere: Secondary | ICD-10-CM | POA: Diagnosis present

## 2022-09-03 DIAGNOSIS — Z87891 Personal history of nicotine dependence: Secondary | ICD-10-CM | POA: Diagnosis not present

## 2022-09-03 DIAGNOSIS — I2489 Other forms of acute ischemic heart disease: Secondary | ICD-10-CM | POA: Diagnosis present

## 2022-09-03 DIAGNOSIS — L03115 Cellulitis of right lower limb: Secondary | ICD-10-CM | POA: Diagnosis present

## 2022-09-03 DIAGNOSIS — J4489 Other specified chronic obstructive pulmonary disease: Secondary | ICD-10-CM | POA: Diagnosis present

## 2022-09-03 DIAGNOSIS — M6259 Muscle wasting and atrophy, not elsewhere classified, multiple sites: Secondary | ICD-10-CM | POA: Diagnosis not present

## 2022-09-03 DIAGNOSIS — G2581 Restless legs syndrome: Secondary | ICD-10-CM | POA: Diagnosis present

## 2022-09-03 DIAGNOSIS — Z7401 Bed confinement status: Secondary | ICD-10-CM | POA: Diagnosis not present

## 2022-09-03 DIAGNOSIS — S5012XA Contusion of left forearm, initial encounter: Secondary | ICD-10-CM | POA: Diagnosis not present

## 2022-09-03 DIAGNOSIS — R278 Other lack of coordination: Secondary | ICD-10-CM | POA: Diagnosis not present

## 2022-09-03 DIAGNOSIS — E039 Hypothyroidism, unspecified: Secondary | ICD-10-CM | POA: Diagnosis present

## 2022-09-03 DIAGNOSIS — I5032 Chronic diastolic (congestive) heart failure: Secondary | ICD-10-CM | POA: Diagnosis not present

## 2022-09-03 DIAGNOSIS — Z7901 Long term (current) use of anticoagulants: Secondary | ICD-10-CM | POA: Diagnosis not present

## 2022-09-03 DIAGNOSIS — Z8661 Personal history of infections of the central nervous system: Secondary | ICD-10-CM | POA: Diagnosis not present

## 2022-09-03 DIAGNOSIS — I959 Hypotension, unspecified: Secondary | ICD-10-CM | POA: Diagnosis present

## 2022-09-03 DIAGNOSIS — I420 Dilated cardiomyopathy: Secondary | ICD-10-CM | POA: Diagnosis not present

## 2022-09-03 DIAGNOSIS — I4891 Unspecified atrial fibrillation: Secondary | ICD-10-CM | POA: Insufficient documentation

## 2022-09-03 DIAGNOSIS — X58XXXA Exposure to other specified factors, initial encounter: Secondary | ICD-10-CM | POA: Diagnosis not present

## 2022-09-03 DIAGNOSIS — M7989 Other specified soft tissue disorders: Secondary | ICD-10-CM | POA: Diagnosis present

## 2022-09-03 DIAGNOSIS — I10 Essential (primary) hypertension: Secondary | ICD-10-CM | POA: Diagnosis not present

## 2022-09-03 DIAGNOSIS — L0390 Cellulitis, unspecified: Secondary | ICD-10-CM | POA: Diagnosis not present

## 2022-09-03 DIAGNOSIS — R600 Localized edema: Secondary | ICD-10-CM | POA: Diagnosis not present

## 2022-09-03 DIAGNOSIS — Z66 Do not resuscitate: Secondary | ICD-10-CM | POA: Diagnosis present

## 2022-09-03 DIAGNOSIS — I5021 Acute systolic (congestive) heart failure: Secondary | ICD-10-CM | POA: Diagnosis not present

## 2022-09-03 DIAGNOSIS — H9190 Unspecified hearing loss, unspecified ear: Secondary | ICD-10-CM | POA: Diagnosis present

## 2022-09-03 DIAGNOSIS — I4819 Other persistent atrial fibrillation: Secondary | ICD-10-CM | POA: Diagnosis present

## 2022-09-03 DIAGNOSIS — R1312 Dysphagia, oropharyngeal phase: Secondary | ICD-10-CM | POA: Diagnosis not present

## 2022-09-03 DIAGNOSIS — I509 Heart failure, unspecified: Principal | ICD-10-CM

## 2022-09-03 DIAGNOSIS — Z9011 Acquired absence of right breast and nipple: Secondary | ICD-10-CM

## 2022-09-03 DIAGNOSIS — J90 Pleural effusion, not elsewhere classified: Secondary | ICD-10-CM | POA: Diagnosis not present

## 2022-09-03 DIAGNOSIS — R0602 Shortness of breath: Secondary | ICD-10-CM | POA: Diagnosis not present

## 2022-09-03 DIAGNOSIS — E8770 Fluid overload, unspecified: Secondary | ICD-10-CM

## 2022-09-03 DIAGNOSIS — M6281 Muscle weakness (generalized): Secondary | ICD-10-CM | POA: Diagnosis not present

## 2022-09-03 DIAGNOSIS — R079 Chest pain, unspecified: Secondary | ICD-10-CM | POA: Diagnosis not present

## 2022-09-03 DIAGNOSIS — L03116 Cellulitis of left lower limb: Secondary | ICD-10-CM | POA: Diagnosis not present

## 2022-09-03 DIAGNOSIS — R7989 Other specified abnormal findings of blood chemistry: Secondary | ICD-10-CM | POA: Diagnosis not present

## 2022-09-03 DIAGNOSIS — I350 Nonrheumatic aortic (valve) stenosis: Secondary | ICD-10-CM | POA: Diagnosis not present

## 2022-09-03 DIAGNOSIS — M6258 Muscle wasting and atrophy, not elsewhere classified, other site: Secondary | ICD-10-CM | POA: Diagnosis not present

## 2022-09-03 DIAGNOSIS — Z743 Need for continuous supervision: Secondary | ICD-10-CM | POA: Diagnosis not present

## 2022-09-03 DIAGNOSIS — I5020 Unspecified systolic (congestive) heart failure: Secondary | ICD-10-CM | POA: Diagnosis not present

## 2022-09-03 LAB — COMPREHENSIVE METABOLIC PANEL
ALT: 13 U/L (ref 0–44)
AST: 18 U/L (ref 15–41)
Albumin: 3.7 g/dL (ref 3.5–5.0)
Alkaline Phosphatase: 105 U/L (ref 38–126)
Anion gap: 12 (ref 5–15)
BUN: 34 mg/dL — ABNORMAL HIGH (ref 8–23)
CO2: 23 mmol/L (ref 22–32)
Calcium: 9.1 mg/dL (ref 8.9–10.3)
Chloride: 102 mmol/L (ref 98–111)
Creatinine, Ser: 1.42 mg/dL — ABNORMAL HIGH (ref 0.44–1.00)
GFR, Estimated: 34 mL/min — ABNORMAL LOW (ref >60)
Glucose, Bld: 109 mg/dL — ABNORMAL HIGH (ref 70–99)
Potassium: 3.9 mmol/L (ref 3.5–5.1)
Sodium: 137 mmol/L (ref 135–145)
Total Bilirubin: 0.8 mg/dL (ref 0.3–1.2)
Total Protein: 7.6 g/dL (ref 6.5–8.1)

## 2022-09-03 LAB — HEPATIC FUNCTION PANEL
ALT: 12 U/L (ref 0–44)
AST: 19 U/L (ref 15–41)
Albumin: 3.8 g/dL (ref 3.5–5.0)
Alkaline Phosphatase: 107 U/L (ref 38–126)
Bilirubin, Direct: 0.2 mg/dL (ref 0.0–0.2)
Indirect Bilirubin: 0.6 mg/dL (ref 0.3–0.9)
Total Bilirubin: 0.8 mg/dL (ref 0.3–1.2)
Total Protein: 7.6 g/dL (ref 6.5–8.1)

## 2022-09-03 LAB — CBC WITH DIFFERENTIAL/PLATELET
Abs Immature Granulocytes: 0 10*3/uL (ref 0.00–0.07)
Basophils Absolute: 0 10*3/uL (ref 0.0–0.1)
Basophils Relative: 1 %
Eosinophils Absolute: 0.2 10*3/uL (ref 0.0–0.5)
Eosinophils Relative: 3 %
HCT: 36.2 % (ref 36.0–46.0)
Hemoglobin: 11.6 g/dL — ABNORMAL LOW (ref 12.0–15.0)
Immature Granulocytes: 0 %
Lymphocytes Relative: 16 %
Lymphs Abs: 1 10*3/uL (ref 0.7–4.0)
MCH: 31 pg (ref 26.0–34.0)
MCHC: 32 g/dL (ref 30.0–36.0)
MCV: 96.8 fL (ref 80.0–100.0)
Monocytes Absolute: 1 10*3/uL (ref 0.1–1.0)
Monocytes Relative: 16 %
Neutro Abs: 4.2 10*3/uL (ref 1.7–7.7)
Neutrophils Relative %: 64 %
Platelets: 180 10*3/uL (ref 150–400)
RBC: 3.74 MIL/uL — ABNORMAL LOW (ref 3.87–5.11)
RDW: 13.8 % (ref 11.5–15.5)
WBC: 6.5 10*3/uL (ref 4.0–10.5)
nRBC: 0 % (ref 0.0–0.2)

## 2022-09-03 LAB — CBC
HCT: 34.8 % — ABNORMAL LOW (ref 36.0–46.0)
Hemoglobin: 11.5 g/dL — ABNORMAL LOW (ref 12.0–15.0)
MCH: 32.1 pg (ref 26.0–34.0)
MCHC: 33 g/dL (ref 30.0–36.0)
MCV: 97.2 fL (ref 80.0–100.0)
Platelets: 188 10*3/uL (ref 150–400)
RBC: 3.58 MIL/uL — ABNORMAL LOW (ref 3.87–5.11)
RDW: 13.5 % (ref 11.5–15.5)
WBC: 6.2 10*3/uL (ref 4.0–10.5)
nRBC: 0 % (ref 0.0–0.2)

## 2022-09-03 LAB — LACTIC ACID, PLASMA: Lactic Acid, Venous: 1.1 mmol/L (ref 0.5–1.9)

## 2022-09-03 LAB — CREATININE, SERUM
Creatinine, Ser: 1.33 mg/dL — ABNORMAL HIGH (ref 0.44–1.00)
GFR, Estimated: 37 mL/min — ABNORMAL LOW (ref >60)

## 2022-09-03 LAB — TROPONIN I (HIGH SENSITIVITY)
Troponin I (High Sensitivity): 600 ng/L (ref <18)
Troponin I (High Sensitivity): 520 ng/L (ref <18)

## 2022-09-03 LAB — BRAIN NATRIURETIC PEPTIDE: B Natriuretic Peptide: 1161.3 pg/mL — ABNORMAL HIGH (ref 0.0–100.0)

## 2022-09-03 MED ORDER — FUROSEMIDE 10 MG/ML IJ SOLN
40.0000 mg | Freq: Two times a day (BID) | INTRAMUSCULAR | Status: DC
  Administered 2022-09-04 – 2022-09-05 (×3): 40 mg via INTRAVENOUS
  Filled 2022-09-03 (×3): qty 4

## 2022-09-03 MED ORDER — ASPIRIN 81 MG PO TBEC
81.0000 mg | DELAYED_RELEASE_TABLET | Freq: Every day | ORAL | Status: DC
  Administered 2022-09-04 – 2022-09-10 (×7): 81 mg via ORAL
  Filled 2022-09-03 (×7): qty 1

## 2022-09-03 MED ORDER — VANCOMYCIN HCL 1750 MG/350ML IV SOLN
1750.0000 mg | Freq: Once | INTRAVENOUS | Status: AC
  Administered 2022-09-03: 1750 mg via INTRAVENOUS
  Filled 2022-09-03: qty 350

## 2022-09-03 MED ORDER — METOPROLOL SUCCINATE ER 50 MG PO TB24
50.0000 mg | ORAL_TABLET | Freq: Every day | ORAL | Status: DC
  Administered 2022-09-04 – 2022-09-06 (×2): 50 mg via ORAL
  Filled 2022-09-03 (×4): qty 1

## 2022-09-03 MED ORDER — SENNA 8.6 MG PO TABS
1.0000 | ORAL_TABLET | Freq: Every day | ORAL | Status: DC | PRN
  Administered 2022-09-05 – 2022-09-07 (×3): 8.6 mg via ORAL
  Filled 2022-09-03 (×3): qty 1

## 2022-09-03 MED ORDER — TRAZODONE HCL 50 MG PO TABS
50.0000 mg | ORAL_TABLET | Freq: Every evening | ORAL | Status: DC | PRN
  Administered 2022-09-06: 50 mg via ORAL
  Filled 2022-09-03 (×2): qty 1

## 2022-09-03 MED ORDER — ENOXAPARIN SODIUM 30 MG/0.3ML IJ SOSY
30.0000 mg | PREFILLED_SYRINGE | INTRAMUSCULAR | Status: DC
  Administered 2022-09-04 – 2022-09-07 (×4): 30 mg via SUBCUTANEOUS
  Filled 2022-09-03 (×4): qty 0.3

## 2022-09-03 MED ORDER — ACETAMINOPHEN 325 MG PO TABS
650.0000 mg | ORAL_TABLET | Freq: Four times a day (QID) | ORAL | Status: DC | PRN
  Administered 2022-09-05: 650 mg via ORAL
  Filled 2022-09-03: qty 2

## 2022-09-03 MED ORDER — LEVOTHYROXINE SODIUM 50 MCG PO TABS
100.0000 ug | ORAL_TABLET | Freq: Every day | ORAL | Status: DC
  Administered 2022-09-04 – 2022-09-10 (×7): 100 ug via ORAL
  Filled 2022-09-03 (×7): qty 2

## 2022-09-03 MED ORDER — FUROSEMIDE 10 MG/ML IJ SOLN
40.0000 mg | Freq: Once | INTRAMUSCULAR | Status: AC
  Administered 2022-09-03: 40 mg via INTRAVENOUS
  Filled 2022-09-03: qty 4

## 2022-09-03 MED ORDER — ATORVASTATIN CALCIUM 20 MG PO TABS
40.0000 mg | ORAL_TABLET | Freq: Every day | ORAL | Status: DC
  Administered 2022-09-03 – 2022-09-10 (×8): 40 mg via ORAL
  Filled 2022-09-03 (×8): qty 2

## 2022-09-03 MED ORDER — SODIUM CHLORIDE 0.9% FLUSH
3.0000 mL | Freq: Two times a day (BID) | INTRAVENOUS | Status: DC
  Administered 2022-09-04 – 2022-09-10 (×11): 3 mL via INTRAVENOUS

## 2022-09-03 MED ORDER — ASPIRIN 81 MG PO CHEW
324.0000 mg | CHEWABLE_TABLET | Freq: Once | ORAL | Status: AC
  Administered 2022-09-03: 324 mg via ORAL
  Filled 2022-09-03: qty 4

## 2022-09-03 MED ORDER — ENOXAPARIN SODIUM 40 MG/0.4ML IJ SOSY: 40.0000 mg | PREFILLED_SYRINGE | INTRAMUSCULAR | Status: DC

## 2022-09-03 MED ORDER — VANCOMYCIN VARIABLE DOSE PER UNSTABLE RENAL FUNCTION (PHARMACIST DOSING): Status: DC

## 2022-09-03 MED ORDER — ACETAMINOPHEN 650 MG RE SUPP: 650.0000 mg | Freq: Four times a day (QID) | RECTAL | Status: DC | PRN

## 2022-09-03 MED ORDER — HYDROCODONE-ACETAMINOPHEN 5-325 MG PO TABS
1.0000 | ORAL_TABLET | Freq: Four times a day (QID) | ORAL | Status: DC | PRN
  Administered 2022-09-03 – 2022-09-10 (×4): 1 via ORAL
  Filled 2022-09-03 (×4): qty 1

## 2022-09-03 MED ORDER — METOPROLOL TARTRATE 5 MG/5ML IV SOLN
5.0000 mg | Freq: Once | INTRAVENOUS | Status: AC
  Administered 2022-09-03: 5 mg via INTRAVENOUS
  Filled 2022-09-03: qty 5

## 2022-09-03 MED ORDER — SODIUM CHLORIDE 0.9 % IV SOLN
2.0000 g | Freq: Once | INTRAVENOUS | Status: AC
  Administered 2022-09-03: 2 g via INTRAVENOUS
  Filled 2022-09-03: qty 20

## 2022-09-03 NOTE — Assessment & Plan Note (Signed)
Patient blood pressure is soft at this time, I will start the metoprolol tomorrow.  However other antihypertensive agents are being held, currently resume as appropriate in the morning

## 2022-09-03 NOTE — Consult Note (Signed)
Pharmacy Antibiotic Note  Diana [name] is a 89 y.o. female admitted on 09/03/2022 with cellulitis. PMH significant for HTN, AF, hypothyroidism, COPD. She developed a blister on LLE that now has drainage. She was sent to the ED by wound care for evaluation. Pharmacy has been consulted for vancomycin dosing.  Plan: Day 1 of antibiotics Give vancomycin 1750 mg IV x1. Check random vanc level tomorrow given AKI (SCr 1.42, baseline 1.1 in 06/2022)  Continue to monitor renal function and follow culture results   Height: 5\' 1"  (154.9 cm) Weight: 77.6 kg (171 lb 1.2 oz) IBW/kg (Calculated) : 47.8  Temp (24hrs), Avg:97.8 F (36.6 C), Min:97.4 F (36.3 C), Max:98.1 F (36.7 C)  Recent Labs  Lab 09/03/22 1337  WBC 6.5  CREATININE 1.42*  LATICACIDVEN 1.1    Estimated Creatinine Clearance: 21.8 mL/min (A) (by C-G formula based on SCr of 1.42 mg/dL (H)).    Allergies  Allergen Reactions   Meperidine Hcl Nausea And Vomiting    Antimicrobials this admission: 5/17 Ceftriaxone x1 5/17 Vancomycin >>   Dose adjustments this admission: N/A  Microbiology results: 5/17 BCx: ordered 5/17 Wound Cx: ordered   Thank you for allowing pharmacy to be a part of this patient's care.  Celene Squibb, PharmD PGY1 Pharmacy Resident 09/03/2022 8:31 PM

## 2022-09-03 NOTE — ED Provider Notes (Signed)
[facility] Provider Note    Event Date/Time   First MD Initiated Contact with Patient 09/03/22 1740     (approximate)   History   Wound Check   HPI  Diana [name] is a 89 y.o. female with past medical history of arthritis, breast cancer who presents because of leg swelling and redness and drainage.  For about 2 to 3 weeks patient has noted new peripheral edema.  Has not had issues with this in the past.  Denies dyspnea or chest pain.  It is symmetric.  She developed a blister on the left lower extremity that burst and over the last week or so has had drainage.  She was referred to wound care and today did not like the way it looked and were concerned about infection.  I reviewed patient's last PCP note from 5/10.  There is concern for possible new CHF as BNP was elevated and she was scheduled for an echocardiogram but it has been rescheduled multiple times has not been done yet.  She has been on torsemide 20 mg daily since 5/1 but minimal improvement.  She is also on doxycycline for about a week.     Past Medical History:  Diagnosis Date   Arthritis    Bacterial pneumonia, unspecified    Cancer , breast    Candidiasis of mouth    Chronic airway obstruction, not elsewhere classified    son not aware of this   Encephalitis 1961   Family history of adverse reaction to anesthesia    son has nausea   Heart murmur    has had it for years; very mild AS 05/2015 echo   Hyposmolality and/or hyponatremia    Lumbago    Obstructive chronic bronchitis with exacerbation (HCC)    Other abnormal glucose    Pain in joint, shoulder region    Palpitations    Persistent disorder of initiating or maintaining sleep    Restless leg syndrome    Unspecified essential hypertension    Unspecified hypothyroidism    Unspecified viral infection, in conditions classified elsewhere and of unspecified site    Unspecified vitamin D deficiency    Urinary tract infection, site  not specified     Patient Active Problem List   Diagnosis Date Noted   Cellulitis of lower extremity 08/27/2022   Venous stasis ulcers of both lower extremities (HCC) 08/27/2022   Peripheral edema 06/30/2022   Hematoma 04/13/2022   Other chronic pain 06/26/2021   Burning with urination 03/01/2018   Iron deficiency anemia due to chronic blood loss 06/16/2017   Lymphedema of right arm 11/17/2016   Vaginal irritation 10/26/2016   Right-sided low back pain without sciatica 08/22/2015   Spinal stenosis in cervical region 08/07/2015   Cervical spondylosis with myelopathy 06/26/2015   Aortic valve calcification 06/03/2015   BPV (benign positional vertigo) 01/29/2015   Poor balance 10/07/2011   History of right breast cancer 03/29/2011   Vitamin D deficiency 10/17/2009   Prediabetes 10/17/2009   COPD (chronic obstructive pulmonary disease) (HCC) 12/19/2007   INSOMNIA, CHRONIC 10/25/2007   BACK PAIN, LUMBAR 03/01/2007   Hypothyroidism 01/18/2007   HYPONATREMIA, MILD 01/18/2007   Essential hypertension 01/18/2007     Physical Exam  Triage Vital Signs: ED Triage Vitals  Enc Vitals Group     BP 09/03/22 1332 (!) 94/59     Pulse Rate 09/03/22 1332 (!) 115     Resp 09/03/22 1332 20     Temp  09/03/22 1332 (!) 97.4 F (36.3 C)     Temp Source 09/03/22 1332 Oral     SpO2 09/03/22 1332 94 %     Weight 09/03/22 1333 171 lb 1.2 oz (77.6 kg)     Height 09/03/22 1333 5\' 1"  (1.549 m)     Head Circumference --      Peak Flow --      Pain Score 09/03/22 1333 6     Pain Loc --      Pain Edu? --      Excl. in GC? --     Most recent vital signs: Vitals:   09/03/22 1805 09/03/22 1807  BP: (!) 105/50   Pulse: 95   Resp: (!) 22   Temp:  98.1 F (36.7 C)  SpO2: 100%      General: Awake, no distress.  CV:  Good peripheral perfusion.  Significant 3+ pitting edema bilateral lower extremities, the left lower extremity has ulceration on the lateral portion with some granulation tissue  and purulent exudate; the left lower extremity is more erythematous compared to the right extending from the foot to the knee Remedies are warm Resp:  Normal effort.  Abd:  No distention.  No abdominal distention Neuro:             Awake, Alert, Oriented x 3  Other:  There is focal edema of the right upper extremity with a large blister just proximal to the right wrist no left upper extremity swelling   ED Results / Procedures / Treatments  Labs (all labs ordered are listed, but only abnormal results are displayed) Labs Reviewed  COMPREHENSIVE METABOLIC PANEL - Abnormal; Notable for the following components:      Result Value   Glucose, Bld 109 (*)    BUN 34 (*)    Creatinine, Ser 1.42 (*)    GFR, Estimated 34 (*)    All other components within normal limits  CBC WITH DIFFERENTIAL/PLATELET - Abnormal; Notable for the following components:   RBC 3.74 (*)    Hemoglobin 11.6 (*)    All other components within normal limits  BRAIN NATRIURETIC PEPTIDE - Abnormal; Notable for the following components:   B Natriuretic Peptide 1,161.3 (*)    All other components within normal limits  LACTIC ACID, PLASMA  HEPATIC FUNCTION PANEL  URINALYSIS, ROUTINE W REFLEX MICROSCOPIC  TROPONIN I (HIGH SENSITIVITY)     EKG  I reviewed interpreted the EKG which shows atrial fibrillation with prolonged QTc interval nonspecific ST changes   RADIOLOGY I reviewed interpreted the chest x-ray which shows cardiomegaly and increased interstitial markings   PROCEDURES:  Critical Care performed: Yes, see critical care procedure note(s)  .Critical Care  Performed by: Georga Hacking, MD Authorized by: Georga Hacking, MD   Critical care provider statement:    Critical care time (minutes):  30   Critical care was time spent personally by me on the following activities:  Development of treatment plan with patient or surrogate, discussions with consultants, evaluation of patient's response to  treatment, examination of patient, ordering and review of laboratory studies, ordering and review of radiographic studies, ordering and performing treatments and interventions, pulse oximetry, re-evaluation of patient's condition and review of old charts   The patient is on the cardiac monitor to evaluate for evidence of arrhythmia and/or significant heart rate changes.   MEDICATIONS ORDERED IN ED: Medications  furosemide (LASIX) injection 40 mg (has no administration in time range)  cefTRIAXone (ROCEPHIN) 2  g in sodium chloride 0.9 % 100 mL IVPB (has no administration in time range)  metoprolol tartrate (LOPRESSOR) injection 5 mg (5 mg Intravenous Given 09/03/22 1810)     IMPRESSION / MDM / ASSESSMENT AND PLAN / ED COURSE  I reviewed the triage vital signs and the nursing notes.                              Patient's presentation is most consistent with acute complicated illness / injury requiring diagnostic workup.  Differential diagnosis includes, but is not limited to, CHF, venous stasis, lymphedema, peripheral edema with concomitant cellulitis, anasarca  The patient is a 89 year old female presents today due to increasing redness and swelling of the left lower extremity.  She has had several weeks of new pitting edema which has not been an issue for her in the past.  She was started on torsemide by her PCP and has echo scheduled.  Has had elevated BNP as an outpatient as well there is concern for CHF.  She was started on doxycycline due to concern for infected blister on the left lower extremity about a week ago.  Has followed up with wound care and they were concerned about progression of infection.  Patient is mildly tachycardic.  Temp slightly low at 36.3.  She is not hypoxic.  Heart rates are jumping around in the 110s looks like she could be in A-fib on the monitor I have asked nursing to get an EKG.  Overall looks well.  She does have significant pitting edema in the bilateral  lower extremities.  They are both erythematous but the left is certainly more than the right and that is the leg that has a purulent ulceration.  Interestingly patient also has some edema that is more focal in the right upper extremity.  Basic labs in triage including CBC CMP and lactate are all reassuring.  Have added on hepatic function panel and BMP.  Will get a chest x-ray to assess for pulmonary edema.  Will get EKG as I am concerned for possible A-fib.   Chest x-ray shows cardiomegaly and likely some pulmonary edema.  BNP is elevated over thousand.  I did look at patient's heart with bedside ultrasound EF does look reduced.  Given new A-fib with new CHF patient will require admission.  She did respond well to a dose of metoprolol.  Will avoid calcium channel blockers in the setting of new CHF.  Will give IV Lasix.  Will cover for possible cellulitis with Rocephin.      FINAL CLINICAL IMPRESSION(S) / ED DIAGNOSES   Final diagnoses:  Congestive heart failure, unspecified HF chronicity, unspecified heart failure type (HCC)  Atrial fibrillation, unspecified type (HCC)     Rx / DC Orders   ED Discharge Orders     None        Note:  This document was prepared using Dragon voice recognition software and may include unintentional dictation errors.   Georga Hacking, MD 09/03/22 [place]

## 2022-09-03 NOTE — Progress Notes (Signed)
ARDENA, TALAMANTES ([id number]) [id number] Page 1 of 2 Visit Report for 09/03/2022 Physician Orders Details Patient Name: Date of Service: Diana [name], Diana [name] 09/03/2022 12:15 PM Medical Record Number: [id number] Patient Account Number: [id number] Date of Birth/Sex: Treating RN: 1923/04/20 (89 y.o. Esmeralda Links Primary Care Provider: Kerby Nora Other Clinician: Referring Provider: Treating Provider/Extender: Gweneth Dimitri, Amy Weeks in Treatment: 0 Verbal / Phone Orders: No Diagnosis Coding Follow-up Appointments Return Appointment in 1 week. Nurse Visit as needed Home Health Home Health Company: - Sent to [facility] 09/01/2022. Patient seen in wound care center on Wednesdays. Need dressings changed Mondays and Fridays. ADMIT to Home Health for wound care. May utilize formulary equivalent dressing for wound treatment orders unless otherwise specified. Home Health Nurse may visit PRN to address patients wound care needs. Scheduled days for dressing changes to be completed; exception, patient has scheduled wound care visit that day. **Please direct any NON-WOUND related issues/requests for orders to patient's Primary Care Physician. **If current dressing causes regression in wound condition, may D/C ordered dressing product/s and apply Normal Saline Moist Dressing daily until next Wound Healing Center or Other MD appointment. **Notify Wound Healing Center of regression in wound condition at [phone]. Bathing/ Shower/ Hygiene May shower with wound dressing protected with water repellent cover or cast protector. Edema Control - Lymphedema / Segmental Compressive Device / Other Elevate, Exercise Daily and A void Standing for Long Periods of Time. Patient to wear own Velcro compression garment. Remove compression stockings every night before going to bed and put on every morning when getting up. Elevate legs to the level of the heart and pump ankles as  often as possible Elevate leg(s) parallel to the floor when sitting. Medications-Please add to medication list. ntibiotic - Gentamycin and Mupirocin in clinic Topical A Wound Treatment Wound #1 - Lower Leg Wound Laterality: Right, Posterior Cleanser: Vashe 5.8 (oz) (Home Health) 3 x Per Week/30 Days Discharge Instructions: Use vashe 5.8 (oz) as directed Prim Dressing: ABD Pad, 5x9 (in/in) ary 3 x Per Week/30 Days Secured With: Kerlix Roll Sterile or Non-Sterile 6-ply 4.5x4 (yd/yd) (Home Health) 3 x Per Week/30 Days Discharge Instructions: Apply Kerlix then Coban to secure as directed Wound #2 - Lower Leg Wound Laterality: Left, Lateral Cleanser: Vashe 5.8 (oz) 3 x Per Week/30 Days Discharge Instructions: Use vashe 5.8 (oz) as directed Prim Dressing: Sterile Abdominal Pad, 5x9 (in/in) ary 3 x Per Week/30 Days Secured With: Kerlix Roll Sterile or Non-Sterile 6-ply 4.5x4 (yd/yd) 3 x Per Week/30 Days Discharge Instructions: Apply Kerlix then Coban to secure as directed Electronic Signature(s) Signed: 09/03/2022 1:31:41 PM By: Angelina Pih Signed: 09/03/2022 3:11:21 PM By: Allen Derry PA-C Entered By: Angelina Pih on 09/03/2022 13:04:43 -------------------------------------------------------------------------------- SuperBill Details Patient Name: Date of Service: Diana [name] 09/03/2022 Medical Record Number: [id number] Patient Account Number: [id number] Diana [name], Diana [name] ([id number]) [id number] Page 2 of 2 Date of Birth/Sex: Treating RN: [birth year] (89 y.o. Esmeralda Links Primary Care Provider: Kerby Nora Other Clinician: Referring Provider: Treating Provider/Extender: Gweneth Dimitri, Amy Weeks in Treatment: 0 Diagnosis Coding ICD-10 Codes Code Description [phone] Non-pressure chronic ulcer of other part of right lower leg with fat layer exposed L97.822 Non-pressure chronic ulcer of other part of left lower leg with fat layer  exposed I87.313 Chronic venous hypertension (idiopathic) with ulcer of bilateral lower extremity J44.9 Chronic obstructive pulmonary disease, unspecified Facility Procedures : CPT4 Code: [id number] Description: 99213 - WOUND CARE VISIT-LEV 3 EST PT Modifier: Quantity: 1 Electronic Signature(s)  Signed: 09/03/2022 1:31:41 PM By: Angelina Pih Signed: 09/03/2022 3:11:21 PM By: Allen Derry PA-C Entered By: Angelina Pih on 09/03/2022 13:12:22

## 2022-09-03 NOTE — Assessment & Plan Note (Signed)
Incidental finding, echo pending, check thyroid cascade in the morning, already reasonably rate controlled.  Metoprolol will start in the morning

## 2022-09-03 NOTE — Assessment & Plan Note (Signed)
Given EKG findings as well as no symptoms related to cardiovascular system besides edema which is chronic, I do not think patient has an acute coronary syndrome at this time.  Echo is pending.  Will treat the patient with aspirin and statin, beta-blocker will start tomorrow, please note patient did get 1 dose of IV metoprolol in the ER.  We will check lower extremity Dopplers to rule out venous thromboembolism.  Recheck troponin now.  Primary concern would be that patient has had coronary syndrome sometime in the last several weeks or months.

## 2022-09-03 NOTE — Progress Notes (Signed)
PHARMACIST - PHYSICIAN COMMUNICATION  CONCERNING:  Enoxaparin (Lovenox) for DVT Prophylaxis    RECOMMENDATION: Patient was prescribed enoxaprin 40mg  q24 hours for VTE prophylaxis.   Filed Weights   09/03/22 1333  Weight: 77.6 kg (171 lb 1.2 oz)    Body mass index is 32.32 kg/m.  Estimated Creatinine Clearance: 21.8 mL/min (A) (by C-G formula based on SCr of 1.42 mg/dL (H)).   Patient is candidate for enoxaparin 30mg  every 24 hours based on CrCl <11ml/min or Weight <45kg  DESCRIPTION: Pharmacy has adjusted enoxaparin dose per [facility] policy.  Patient is now receiving enoxaparin 30 mg every 24hrs hours    Jaqwon Manfred Rodriguez-Guzman PharmD, BCPS 09/03/2022 8:37 PM

## 2022-09-03 NOTE — ED Triage Notes (Signed)
Pt to ED with son from wound care clinic. Pt has wound since 1 month to L foot which started as blister from fluid overload. Took abx about 1 week ago for this. Wound is to L leg and foot and also one on R leg (currently wrapped and bandaged). Pt HOH.  Basic labs, lactic, blue top, 1 set cultures drawn in triage. 20# placed in triage.

## 2022-09-03 NOTE — Assessment & Plan Note (Signed)
Is a chronic issue as per history, however workup as an outpatient is still pending.  Therefore I will order an echo and lower extremity Dopplers as well as urine protein and creatinine ratio.  We will treat the patient with IV Lasix as patient has had poor response thus far to outpatient oral diuretics

## 2022-09-03 NOTE — H&P (Signed)
History and Physical    Patient: Diana [name] ZOX:[id number] DOB: [birth year] DOA: 09/03/2022 DOS: the patient was seen and examined on 09/03/2022 PCP: Excell Seltzer, MD  Patient coming from: Home  Chief Complaint:  Chief Complaint  Patient presents with   Wound Check   HPI: Diana [name] is a 89 y.o. female with medical history significant of chronic hard of hearing. History supplemented by son bruce at bedside who is the HCP as well.  having progressive lower extremity swelling for the last 3 months that is being worked up as an outpatient including a pending echo.  Patient has not been started on outpatient diuretics however son Smitty Cords at the bedside reports that swelling has progressed.  About 4 weeks ago patient developed a blister on the dorsum of the left leg which has since progressed into wound.  Patient started having spreading erythema of the site about 3 days ago with increased tenderness of the area.  There is no report of patient having fever vomiting or diarrhea or rigors.  Patient was started on outpatient antibiotic therapy in spite of this the erythema has progressed.  Today patient was evaluated by wound care who advised that the patient come to the ER/hospital for IV antibiotics.  Patient denies any rigors any trauma or trouble breathing any fall any trauma.  No dysuria medical evaluation is sought  ER course is notable for patient being started on IV antibiotics ceftriaxone. Review of Systems: As mentioned in the history of present illness. All other systems reviewed and are negative. Past Medical History:  Diagnosis Date   Arthritis    Bacterial pneumonia, unspecified    Cancer , breast    Candidiasis of mouth    Chronic airway obstruction, not elsewhere classified    son not aware of this   Encephalitis 1961   Family history of adverse reaction to anesthesia    son has nausea   Heart murmur    has had it for years; very mild AS 05/2015 echo   Hyposmolality  and/or hyponatremia    Lumbago    Obstructive chronic bronchitis with exacerbation (HCC)    Other abnormal glucose    Pain in joint, shoulder region    Palpitations    Persistent disorder of initiating or maintaining sleep    Restless leg syndrome    Unspecified essential hypertension    Unspecified hypothyroidism    Unspecified viral infection, in conditions classified elsewhere and of unspecified site    Unspecified vitamin D deficiency    Urinary tract infection, site not specified    Past Surgical History:  Procedure Laterality Date   ABI  04/19/05   Negative   Back MRI  2005   mild bulging disks   EYE SURGERY Bilateral    Cataract surgery    HEMORRHOID SURGERY     MASTECTOMY, RADICAL     POSTERIOR CERVICAL LAMINECTOMY N/A 06/26/2015   Procedure: Cervical three-four POSTERIOR CERVICAL LAMINECTOMY for decompression ;  Surgeon: Loura Halt Ditty, MD;  Location: [facility];  Service: Neurosurgery;  Laterality: N/A;  C34 laminectomy   Venous doppler  04/19/05   LE normal   Social History:  reports that she has quit smoking. Her smoking use included cigarettes. She has a 21.00 pack-year smoking history. She has never used smokeless tobacco. She reports that she does not drink alcohol and does not use drugs.  Allergies  Allergen Reactions   Meperidine Hcl Nausea And Vomiting    Family History  Problem Relation Age of Onset   Rheum arthritis Father    Stroke Mother    Diabetes Mother    Healthy Sister    Healthy Sister    Liver cancer Unknown        Aunt   Stomach cancer Unknown        Aunt    Prior to Admission medications   Medication Sig Start Date End Date Taking? Authorizing Provider  amLODipine-valsartan (EXFORGE) 10-160 MG tablet Take 1 tablet by mouth daily. 06/17/22  Yes Bedsole, Amy E, MD  doxycycline (VIBRA-TABS) 100 MG tablet Take 1 tablet (100 mg total) by mouth 2 (two) times daily. 08/27/22  Yes Bedsole, Amy E, MD  HYDROcodone-acetaminophen (NORCO/VICODIN)  5-325 MG tablet TAKE 1 TO 2 TABLET BY MOUTH DAILY AS NEEDED FOR SEVERE PAIN 06/29/22  Yes Bedsole, Amy E, MD  levothyroxine (SYNTHROID) 100 MCG tablet Take 1 tablet (100 mcg total) by mouth daily. 08/18/22  Yes Bedsole, Amy E, MD  metoprolol succinate (TOPROL-XL) 50 MG 24 hr tablet TAKE 1 TABLET BY MOUTH ONCE DAILY WITH OR IMMEDIATELY FOLLOWING A MEAL 07/08/22  Yes Bedsole, Amy E, MD  senna (SENOKOT) 8.6 MG TABS tablet Take 1 tablet by mouth daily as needed for mild constipation.   Yes [provider]  torsemide (DEMADEX) 20 MG tablet Take 1 tablet (20 mg total) by mouth daily. 08/31/22  Yes Bedsole, Amy E, MD  traZODone (DESYREL) 50 MG tablet TAKE 1/2 TO 1 TABLET BY MOUTH AT BEDTIMEAS NEEDED FOR SLEEP 04/06/22  Yes Bedsole, Amy E, MD  Cholecalciferol (VITAMIN D3) 1.25 MG (50000 UT) TABS Take 1 tablet by mouth every 7 (seven) days. Patient not taking: Reported on 09/03/2022 06/30/22   Excell Seltzer, MD  ergocalciferol (VITAMIN D2) 1.25 MG (50000 UT) capsule Take 50,000 Units by mouth once a week. Patient not taking: Reported on 09/03/2022    [provider]    Physical Exam: Vitals:   09/03/22 1805 09/03/22 1807 09/03/22 1830 09/03/22 1900  BP: (!) 105/50  (!) 102/59 106/73  Pulse: 95  (!) 59 96  Resp: (!) 22  14 15   Temp:  98.1 F (36.7 C)    TempSrc:  Oral    SpO2: 100%  96% 96%  Weight:      Height:       General: Patient is hard of hearing, does not appear to be in any distress, accompanied by son Bruce at the bedside Respiratory exam: Bilateral intravesicular Cardiovascular exam S1-S2 normal Abdomen soft nontender Extremities: 2+ edema up to the mid thigh area bilaterally.  There is marked erythema from the ankle to above the knee of the left lower extremity that is circumferential.  There is a wound on the dorsum of the leg.  Please see photo below.  Area is tender.  There is scab/wound d dollar sized on the right posterior calf as well.  With much less erythema around  it within 2 cm.    Media Information  Document Information  Photos  Left leg purulent cellulitis  09/03/2022 19:28  Attached To:  Hospital Encounter on 09/03/22  Source Information  Nolberto Hanlon, MD  [facility]    Media Information  Document Information  Photos  Right calf  09/03/2022 19:28  Attached To:  Hospital Encounter on 09/03/22  Source Information  Nolberto Hanlon, MD  [facility]   Data Reviewed:  Labs on Admission:  Results for orders placed or performed during the hospital encounter of 09/03/22 (from  the past 24 hour(s))  Lactic acid, plasma     Status: None   Collection Time: 09/03/22  1:37 PM  Result Value Ref Range   Lactic Acid, Venous 1.1 0.5 - 1.9 mmol/L  Comprehensive metabolic panel     Status: Abnormal   Collection Time: 09/03/22  1:37 PM  Result Value Ref Range   Sodium 137 135 - 145 mmol/L   Potassium 3.9 3.5 - 5.1 mmol/L   Chloride 102 98 - 111 mmol/L   CO2 23 22 - 32 mmol/L   Glucose, Bld 109 (H) 70 - 99 mg/dL   BUN 34 (H) 8 - 23 mg/dL   Creatinine, Ser 6.57 (H) 0.44 - 1.00 mg/dL   Calcium 9.1 8.9 - 84.6 mg/dL   Total Protein 7.6 6.5 - 8.1 g/dL   Albumin 3.7 3.5 - 5.0 g/dL   AST 18 15 - 41 U/L   ALT 13 0 - 44 U/L   Alkaline Phosphatase 105 38 - 126 U/L   Total Bilirubin 0.8 0.3 - 1.2 mg/dL   GFR, Estimated 34 (L) >60 mL/min   Anion gap 12 5 - 15  CBC with Differential     Status: Abnormal   Collection Time: 09/03/22  1:37 PM  Result Value Ref Range   WBC 6.5 4.0 - 10.5 K/uL   RBC 3.74 (L) 3.87 - 5.11 MIL/uL   Hemoglobin 11.6 (L) 12.0 - 15.0 g/dL   HCT 96.2 95.2 - 84.1 %   MCV 96.8 80.0 - 100.0 fL   MCH 31.0 26.0 - 34.0 pg   MCHC 32.0 30.0 - 36.0 g/dL   RDW 32.4 40.1 - 02.7 %   Platelets 180 150 - 400 K/uL   nRBC 0.0 0.0 - 0.2 %   Neutrophils Relative % 64 %   Neutro Abs 4.2 1.7 - 7.7 K/uL   Lymphocytes Relative 16 %   Lymphs Abs 1.0 0.7 - 4.0 K/uL   Monocytes Relative 16 %   Monocytes Absolute  1.0 0.1 - 1.0 K/uL   Eosinophils Relative 3 %   Eosinophils Absolute 0.2 0.0 - 0.5 K/uL   Basophils Relative 1 %   Basophils Absolute 0.0 0.0 - 0.1 K/uL   Immature Granulocytes 0 %   Abs Immature Granulocytes 0.00 0.00 - 0.07 K/uL  Brain natriuretic peptide     Status: Abnormal   Collection Time: 09/03/22  1:37 PM  Result Value Ref Range   B Natriuretic Peptide 1,161.3 (H) 0.0 - 100.0 pg/mL  Hepatic function panel     Status: None   Collection Time: 09/03/22  1:37 PM  Result Value Ref Range   Total Protein 7.6 6.5 - 8.1 g/dL   Albumin 3.8 3.5 - 5.0 g/dL   AST 19 15 - 41 U/L   ALT 12 0 - 44 U/L   Alkaline Phosphatase 107 38 - 126 U/L   Total Bilirubin 0.8 0.3 - 1.2 mg/dL   Bilirubin, Direct 0.2 0.0 - 0.2 mg/dL   Indirect Bilirubin 0.6 0.3 - 0.9 mg/dL  Troponin I (High Sensitivity)     Status: Abnormal   Collection Time: 09/03/22  1:43 PM  Result Value Ref Range   Troponin I (High Sensitivity) 600 (HH) <18 ng/L   Basic Metabolic Panel: Recent Labs  Lab 09/03/22 1337  NA 137  K 3.9  CL 102  CO2 23  GLUCOSE 109*  BUN 34*  CREATININE 1.42*  CALCIUM 9.1   Liver Function Tests: Recent Labs  Lab 09/03/22 1337  AST 19  18  ALT 12  13  ALKPHOS 107  105  BILITOT 0.8  0.8  PROT 7.6  7.6  ALBUMIN 3.8  3.7   No results for input(s): "LIPASE", "AMYLASE" in the last 168 hours. No results for input(s): "AMMONIA" in the last 168 hours. CBC: Recent Labs  Lab 09/03/22 1337  WBC 6.5  NEUTROABS 4.2  HGB 11.6*  HCT 36.2  MCV 96.8  PLT 180   Cardiac Enzymes: Recent Labs  Lab 09/03/22 1343  TROPONINIHS 600*    BNP (last 3 results) Recent Labs    06/29/22 1642  PROBNP 756.0*   CBG: No results for input(s): "GLUCAP" in the last 168 hours.  Radiological Exams on Admission:  DG Chest Portable 1 View  Result Date: 09/03/2022 CLINICAL DATA:  Shortness of breath EXAM: PORTABLE CHEST 1 VIEW COMPARISON:  05/21/2007 FINDINGS: Transverse diameter of heart is  increased. Central pulmonary vessels are more prominent. Small bilateral pleural effusions are seen. There is no focal pulmonary consolidation. There is a deformity in few left ribs suggesting old healed fractures. IMPRESSION: Cardiomegaly. Central pulmonary vessels are more prominent. There is interval increase in interstitial markings in the parahilar regions and lower lung fields suggesting possible CHF with mild interstitial pulmonary edema. Blunting of both lateral CP angles suggests small bilateral pleural effusions. Electronically Signed   By: Ernie Avena M.D.   On: 09/03/2022 18:10    EKG: Independently reviewed. Afib.   Assessment and Plan: * Cellulitis of lower extremity Spreading from above the knee to the ankle.  With poor response to outpatient antibiotic therapy.  Associated with wound and purulence, we will send a culture of the wound and start the patient on IV vancomycin for purulent cellulitis  A-fib (HCC) Incidental finding, echo pending, check thyroid cascade in the morning, already reasonably rate controlled.  Metoprolol will start in the morning  Troponin I above reference range Given EKG findings as well as no symptoms related to cardiovascular system besides edema which is chronic, I do not think patient has an acute coronary syndrome at this time.  Echo is pending.  Will treat the patient with aspirin and statin, beta-blocker will start tomorrow, please note patient did get 1 dose of IV metoprolol in the ER.  We will check lower extremity Dopplers to rule out venous thromboembolism.  Recheck troponin now.  Primary concern would be that patient has had coronary syndrome sometime in the last several weeks or months.  Fluid overload Is a chronic issue as per history, however workup as an outpatient is still pending.  Therefore I will order an echo and lower extremity Dopplers as well as urine protein and creatinine ratio.  We will treat the patient with IV Lasix as  patient has had poor response thus far to outpatient oral diuretics  Essential hypertension Patient blood pressure is soft at this time, I will start the metoprolol tomorrow.  However other antihypertensive agents are being held, currently resume as appropriate in the morning      Advance Care Planning:   Code Status: DNR advised to me by patient's son Bruce corroborated by patient.  Patient does not want to be resuscitated or intubated even temporarily for temporary illness such as pneumonia.  Patient apparently has a living will that has previously been signed to this effect.  I asked patient's son to provide a copy when able.  Son is the healthcare proxy  Consults: None  Family Communication: Smitty Cords, son at the bedside  Severity of Illness: The appropriate patient status for this patient is INPATIENT. Inpatient status is judged to be reasonable and necessary in order to provide the required intensity of service to ensure the patient's safety. The patient's presenting symptoms, physical exam findings, and initial radiographic and laboratory data in the context of their chronic comorbidities is felt to place them at high risk for further clinical deterioration. Furthermore, it is not anticipated that the patient will be medically stable for discharge from the hospital within 2 midnights of admission.   * I certify that at the point of admission it is my clinical judgment that the patient will require inpatient hospital care spanning beyond 2 midnights from the point of admission due to high intensity of service, high risk for further deterioration and high frequency of surveillance required.*  Author: Nolberto Hanlon, MD 09/03/2022 8:13 PM  For on call review [email or web address].

## 2022-09-03 NOTE — Assessment & Plan Note (Signed)
Spreading from above the knee to the ankle.  With poor response to outpatient antibiotic therapy.  Associated with wound and purulence, we will send a culture of the wound and start the patient on IV vancomycin for purulent cellulitis

## 2022-09-04 DIAGNOSIS — L03116 Cellulitis of left lower limb: Secondary | ICD-10-CM | POA: Diagnosis not present

## 2022-09-04 LAB — CBC
HCT: 33.1 % — ABNORMAL LOW (ref 36.0–46.0)
Hemoglobin: 10.7 g/dL — ABNORMAL LOW (ref 12.0–15.0)
MCH: 31.2 pg (ref 26.0–34.0)
MCHC: 32.3 g/dL (ref 30.0–36.0)
MCV: 96.5 fL (ref 80.0–100.0)
Platelets: 166 10*3/uL (ref 150–400)
RBC: 3.43 MIL/uL — ABNORMAL LOW (ref 3.87–5.11)
RDW: 13.7 % (ref 11.5–15.5)
WBC: 5.6 10*3/uL (ref 4.0–10.5)
nRBC: 0 % (ref 0.0–0.2)

## 2022-09-04 LAB — LIPID PANEL
Cholesterol: 109 mg/dL (ref 0–200)
HDL: 46 mg/dL (ref >40)
LDL Cholesterol: 54 mg/dL (ref 0–99)
Total CHOL/HDL Ratio: 2.4 RATIO
Triglycerides: 47 mg/dL (ref <150)
VLDL: 9 mg/dL (ref 0–40)

## 2022-09-04 LAB — BASIC METABOLIC PANEL
Anion gap: 9 (ref 5–15)
BUN: 32 mg/dL — ABNORMAL HIGH (ref 8–23)
CO2: 24 mmol/L (ref 22–32)
Calcium: 8.6 mg/dL — ABNORMAL LOW (ref 8.9–10.3)
Chloride: 104 mmol/L (ref 98–111)
Creatinine, Ser: 1.29 mg/dL — ABNORMAL HIGH (ref 0.44–1.00)
GFR, Estimated: 38 mL/min — ABNORMAL LOW (ref >60)
Glucose, Bld: 102 mg/dL — ABNORMAL HIGH (ref 70–99)
Potassium: 4 mmol/L (ref 3.5–5.1)
Sodium: 137 mmol/L (ref 135–145)

## 2022-09-04 LAB — PROTIME-INR
INR: 1.2 (ref 0.8–1.2)
Prothrombin Time: 15.7 seconds — ABNORMAL HIGH (ref 11.4–15.2)

## 2022-09-04 LAB — VANCOMYCIN, RANDOM
Vancomycin Rm: 21 ug/mL
Vancomycin Rm: 14 ug/mL

## 2022-09-04 LAB — CULTURE, BLOOD (ROUTINE X 2): Special Requests: ADEQUATE

## 2022-09-04 LAB — APTT: aPTT: 38 seconds — ABNORMAL HIGH (ref 24–36)

## 2022-09-04 MED ORDER — VANCOMYCIN HCL 1250 MG/250ML IV SOLN
1250.0000 mg | Freq: Once | INTRAVENOUS | Status: AC
  Administered 2022-09-04: 1250 mg via INTRAVENOUS
  Filled 2022-09-04: qty 250

## 2022-09-04 NOTE — Progress Notes (Addendum)
Progress Note   Patient: Diana [name] ZOX:[id number] DOB: January 26, 1924 DOA: 09/03/2022     1 DOS: the patient was seen and examined on 09/04/2022    Subjective:  Patient seen and examined at bedside this morning She tells me her lower extremity redness and pain is improving Denies nausea vomiting chest pain or cough   Brief hospital course:  From HPI "Diana [name] is a 89 y.o. female with medical history significant of chronic hard of hearing. History supplemented by son Diana [name] at bedside who is the HCP as well.  having progressive lower extremity swelling for the last 3 months that is being worked up as an outpatient including a pending echo.  Patient has not been started on outpatient diuretics however son Diana [name] at the bedside reports that swelling has progressed.  About 4 weeks ago patient developed a blister on the dorsum of the left leg which has since progressed into wound.  Patient started having spreading erythema of the site about 3 days ago with increased tenderness of the area.  There is no report of patient having fever vomiting or diarrhea or rigors.  Patient was started on outpatient antibiotic therapy in spite of this the erythema has progressed.  Today patient was evaluated by wound care who advised that the patient come to the ER/hospital for IV antibiotics.   Patient denies any rigors any trauma or trouble breathing any fall any trauma.  No dysuria medical evaluation is sought"   Assessment and Plan: Cellulitis of lower extremity Spreading from above the knee to the ankle.  With poor response to outpatient antibiotic therapy.  Associated with wound and purulence Follow-up on culture results Continue IV vancomycin and ceftriaxone   A-fib (HCC) Incidental finding Currently rate controlled Continue metoprolol TSH requested results pending To discuss anticoagulation as an option before discharge given patient's age Follow-up on echocardiogram   Troponin I above  reference range No chest pain Given EKG findings as well as no symptoms related to cardiovascular system besides edema which is chronic, I do not think patient has an acute coronary syndrome at this time.   Follow-up on echo which is pending.   Continue aspirin and statin, beta-blocker  Follow-up on lower extremity Dopplers to rule out venous thromboembolism.   Continue telemetry monitoring Primary concern would be that patient has had coronary syndrome sometime in the last several weeks or months.   Fluid overload Is a chronic issue as per history, however workup as an outpatient is still pending.  Follow-up on echocardiogram We will treat the patient with IV Lasix as patient has had poor response thus far to outpatient oral diuretics   Essential hypertension Continue metoprolol        Advance Care Planning:   Code Status: DNR  Consults: None   Family Communication: Diana [name], son     Data Reviewed: I personally reviewed patient's laboratory results showing sodium 137 potassium 4.0 creatinine 1.29   Time spent: 50 minutes   Physical Exam: Vitals:   09/03/22 2238 09/03/22 2352 09/04/22 0326 09/04/22 0844  BP: (!) 136/113 96/64  (!) 100/56  Pulse: 89 62  99  Resp: 16 16  16   Temp: (!) 97.4 F (36.3 C) (!) 97.5 F (36.4 C)  (!) 97.4 F (36.3 C)  TempSrc:      SpO2: 96% 98%  97%  Weight: 78.4 kg  77.6 kg   Height: 5\' 4"  (1.626 m)        Author: Loyce Dys, MD  09/04/2022 11:40 AM  For on call review [email or web address].

## 2022-09-04 NOTE — Plan of Care (Signed)
  Problem: Clinical Measurements: Goal: Ability to maintain clinical measurements within normal limits will improve Outcome: Progressing Goal: Will remain free from infection Outcome: Progressing   Problem: Activity: Goal: Risk for activity intolerance will decrease Outcome: Progressing   Problem: Safety: Goal: Ability to remain free from injury will improve Outcome: Progressing   Problem: Skin Integrity: Goal: Risk for impaired skin integrity will decrease Outcome: Progressing   

## 2022-09-04 NOTE — Consult Note (Signed)
WOC Nurse Consult Note: Reason for Consult: bilateral LE wounds, chronic, nonhealing. LLE with partial thickness, RLE with full thickness. Wound type: Mixed etiology, infectious Pressure Injury POA: N/A Measurement:To be measured by Bedside RN today with next dressing change and documented on Nursing Flow Sheet Wound bed:Red, moist Drainage (amount, consistency, odor) yellow, moderate Periwound: edematous, erythematous Dressing procedure/placement/frequency: I have provided Nursing with guidance for the topical care of the LE wounds using a daily cleanse followed by dressing with antimicrobial nonadherent, Xeroform gauze.This is to be topped with dry gauze, ABD pads and secured with Kerlix roll gauze wrap applied form just below toes to just below knee. Feet are to be placed into Prevalon boots, turning and repositioning is in place and a sacral foam is to be placed for PI prevention.  WOC nursing team will not follow, but will remain available to this patient, the nursing and medical teams.  Please re-consult if needed.  Thank you for inviting [place] to participate in this patient's Plan of Care.  Ladona Mow, MSN, RN, CNS, GNP, Leda Min, [employer], [employer] phone:  [phone]

## 2022-09-04 NOTE — Consult Note (Signed)
Pharmacy Antibiotic Note  Diana [name] is a 89 y.o. female admitted on 09/03/2022 with cellulitis. PMH significant for HTN, AF, hypothyroidism, COPD. She developed a blister on LLE that now has drainage. She was sent to the ED by wound care for evaluation. Pharmacy has been consulted for vancomycin dosing.  VR @ 0507 = 21 @ 1413 = 14  Plan: Day 2 of antibiotics Give vancomycin 1250 mg IV x1. Follow up with renal function in AM. Can most likely convert to scheduled regimen if renal function remains stable. Continue to monitor renal function and follow culture results   Height: 5\' 4"  (162.6 cm) Weight: 77.6 kg (171 lb 1.2 oz) IBW/kg (Calculated) : 54.7  Temp (24hrs), Avg:97.6 F (36.4 C), Min:97.4 F (36.3 C), Max:98.1 F (36.7 C)  Recent Labs  Lab 09/03/22 1337 09/03/22 2100 09/04/22 0430 09/04/22 0507 09/04/22 1413  WBC 6.5 6.2 5.6  --   --   CREATININE 1.42* 1.33* 1.29*  --   --   LATICACIDVEN 1.1  --   --   --   --   VANCORANDOM  --   --   --  21 14     Estimated Creatinine Clearance: 25.7 mL/min (A) (by C-G formula based on SCr of 1.29 mg/dL (H)).    Allergies  Allergen Reactions   Meperidine Hcl Nausea And Vomiting    Antimicrobials this admission: 5/17 Ceftriaxone x1 5/17 Vancomycin >>   Dose adjustments this admission: N/A  Microbiology results: 5/17 BCx: ordered 5/17 Wound Cx: ordered   Thank you for allowing pharmacy to be a part of this patient's care.  Sharen Hones, PharmD, BCPS Clinical Pharmacist   09/04/2022 3:10 PM

## 2022-09-05 DIAGNOSIS — L03119 Cellulitis of unspecified part of limb: Secondary | ICD-10-CM | POA: Diagnosis not present

## 2022-09-05 LAB — CBC WITH DIFFERENTIAL/PLATELET
Abs Immature Granulocytes: 0.01 10*3/uL (ref 0.00–0.07)
Basophils Absolute: 0 10*3/uL (ref 0.0–0.1)
Basophils Relative: 1 %
Eosinophils Absolute: 0.2 10*3/uL (ref 0.0–0.5)
Eosinophils Relative: 4 %
HCT: 31.6 % — ABNORMAL LOW (ref 36.0–46.0)
Hemoglobin: 10.4 g/dL — ABNORMAL LOW (ref 12.0–15.0)
Immature Granulocytes: 0 %
Lymphocytes Relative: 13 %
Lymphs Abs: 0.7 10*3/uL (ref 0.7–4.0)
MCH: 31.6 pg (ref 26.0–34.0)
MCHC: 32.9 g/dL (ref 30.0–36.0)
MCV: 96 fL (ref 80.0–100.0)
Monocytes Absolute: 1 10*3/uL (ref 0.1–1.0)
Monocytes Relative: 20 %
Neutro Abs: 3.3 10*3/uL (ref 1.7–7.7)
Neutrophils Relative %: 62 %
Platelets: 152 10*3/uL (ref 150–400)
RBC: 3.29 MIL/uL — ABNORMAL LOW (ref 3.87–5.11)
RDW: 13.7 % (ref 11.5–15.5)
WBC: 5.2 10*3/uL (ref 4.0–10.5)
nRBC: 0 % (ref 0.0–0.2)

## 2022-09-05 LAB — TSH: TSH: 1.198 u[IU]/mL (ref 0.350–4.500)

## 2022-09-05 LAB — AEROBIC CULTURE W GRAM STAIN (SUPERFICIAL SPECIMEN)

## 2022-09-05 LAB — BASIC METABOLIC PANEL
Anion gap: 8 (ref 5–15)
BUN: 29 mg/dL — ABNORMAL HIGH (ref 8–23)
CO2: 26 mmol/L (ref 22–32)
Calcium: 8.6 mg/dL — ABNORMAL LOW (ref 8.9–10.3)
Chloride: 105 mmol/L (ref 98–111)
Creatinine, Ser: 1.14 mg/dL — ABNORMAL HIGH (ref 0.44–1.00)
GFR, Estimated: 44 mL/min — ABNORMAL LOW (ref >60)
Glucose, Bld: 81 mg/dL (ref 70–99)
Potassium: 3.8 mmol/L (ref 3.5–5.1)
Sodium: 139 mmol/L (ref 135–145)

## 2022-09-05 LAB — CULTURE, BLOOD (ROUTINE X 2): Culture: NO GROWTH

## 2022-09-05 MED ORDER — METOPROLOL TARTRATE 5 MG/5ML IV SOLN
5.0000 mg | INTRAVENOUS | Status: DC | PRN
  Administered 2022-09-07: 5 mg via INTRAVENOUS
  Filled 2022-09-05: qty 5

## 2022-09-05 MED ORDER — OXYCODONE HCL 5 MG PO TABS
5.0000 mg | ORAL_TABLET | ORAL | Status: DC | PRN
  Administered 2022-09-05: 5 mg via ORAL
  Filled 2022-09-05: qty 1

## 2022-09-05 MED ORDER — SENNOSIDES-DOCUSATE SODIUM 8.6-50 MG PO TABS: 2.0000 | ORAL_TABLET | Freq: Every evening | ORAL | Status: DC | PRN

## 2022-09-05 MED ORDER — IPRATROPIUM-ALBUTEROL 0.5-2.5 (3) MG/3ML IN SOLN
3.0000 mL | RESPIRATORY_TRACT | Status: DC | PRN
  Administered 2022-09-08: 3 mL via RESPIRATORY_TRACT
  Filled 2022-09-05 (×2): qty 3

## 2022-09-05 MED ORDER — FUROSEMIDE 10 MG/ML IJ SOLN
40.0000 mg | Freq: Every day | INTRAMUSCULAR | Status: DC
  Administered 2022-09-06: 40 mg via INTRAVENOUS
  Filled 2022-09-05: qty 4

## 2022-09-05 MED ORDER — SULFAMETHOXAZOLE-TRIMETHOPRIM 800-160 MG PO TABS
2.0000 | ORAL_TABLET | Freq: Two times a day (BID) | ORAL | Status: DC
  Administered 2022-09-05 – 2022-09-06 (×3): 2 via ORAL
  Filled 2022-09-05 (×3): qty 2

## 2022-09-05 MED ORDER — GUAIFENESIN 100 MG/5ML PO LIQD: 5.0000 mL | ORAL | Status: DC | PRN

## 2022-09-05 MED ORDER — HYDRALAZINE HCL 20 MG/ML IJ SOLN: 10.0000 mg | INTRAMUSCULAR | Status: DC | PRN

## 2022-09-05 NOTE — [place] FL2 (Signed)
[place] MEDICAID FL2 LEVEL OF CARE FORM     IDENTIFICATION  Patient Name: Diana [name] Birthdate: 01/12/1924 Sex: female Admission Date (Current Location): 09/03/2022  [facility] and [place] Number:  Chiropodist and Address:  [facility], [address], [place], [place] [address]      Provider Number: [id number]  Attending Physician Name and Address:  Dimple Nanas, MD  Relative Name and Phone Number:       Current Level of Care: Hospital Recommended Level of Care: Skilled Nursing Facility Prior Approval Number:    Date Approved/Denied:   PASRR Number: [id number]  Discharge Plan:      Current Diagnoses: Patient Active Problem List   Diagnosis Date Noted   Fluid overload 09/03/2022   Troponin I above reference range 09/03/2022   A-fib (HCC) 09/03/2022   Cellulitis of lower extremity 08/27/2022   Venous stasis ulcers of both lower extremities (HCC) 08/27/2022   Peripheral edema 06/30/2022   Hematoma 04/13/2022   Other chronic pain 06/26/2021   Burning with urination 03/01/2018   Iron deficiency anemia due to chronic blood loss 06/16/2017   Lymphedema of right arm 11/17/2016   Vaginal irritation 10/26/2016   Right-sided low back pain without sciatica 08/22/2015   Spinal stenosis in cervical region 08/07/2015   Cervical spondylosis with myelopathy 06/26/2015   Aortic valve calcification 06/03/2015   BPV (benign positional vertigo) 01/29/2015   Poor balance 10/07/2011   History of right breast cancer 03/29/2011   Vitamin D deficiency 10/17/2009   Prediabetes 10/17/2009   COPD (chronic obstructive pulmonary disease) (HCC) 12/19/2007   INSOMNIA, CHRONIC 10/25/2007   BACK PAIN, LUMBAR 03/01/2007   Hypothyroidism 01/18/2007   HYPONATREMIA, MILD 01/18/2007   Essential hypertension 01/18/2007    Orientation RESPIRATION BLADDER Height & Weight     Self, Situation, Place  Normal Continent Weight: 171 lb 1.2 oz (77.6  kg) Height:  5\' 4"  (162.6 cm)  BEHAVIORAL SYMPTOMS/MOOD NEUROLOGICAL BOWEL NUTRITION STATUS        Diet (2 gram sodium)  AMBULATORY STATUS COMMUNICATION OF NEEDS Skin   Extensive Assist Verbally  (pretibial left, tibial posterior right)                       Personal Care Assistance Level of Assistance  Bathing, Feeding, Dressing Bathing Assistance: Maximum assistance Feeding assistance: Limited assistance Dressing Assistance: Maximum assistance     Functional Limitations Info             SPECIAL CARE FACTORS FREQUENCY  PT (By licensed PT), OT (By licensed OT)     PT Frequency: 5 times per week OT Frequency: 5 times per week            Contractures      Additional Factors Info  Code Status, Allergies Code Status Info: DNR Allergies Info: Meperidine Hcl           Current Medications (09/05/2022):  This is the current hospital active medication list Current Facility-Administered Medications  Medication Dose Route Frequency Provider Last Rate Last Admin   acetaminophen (TYLENOL) tablet 650 mg  650 mg Oral Q6H PRN Nolberto Hanlon, MD   650 mg at 09/05/22 1000   Or   acetaminophen (TYLENOL) suppository 650 mg  650 mg Rectal Q6H PRN Nolberto Hanlon, MD       aspirin EC tablet 81 mg  81 mg Oral Daily Nolberto Hanlon, MD   81 mg at 09/05/22 1000   atorvastatin (  LIPITOR) tablet 40 mg  40 mg Oral Daily Nolberto Hanlon, MD   40 mg at 09/05/22 1000   enoxaparin (LOVENOX) injection 30 mg  30 mg Subcutaneous Q24H Nolberto Hanlon, MD   30 mg at 09/05/22 1001   [START ON 09/06/2022] furosemide (LASIX) injection 40 mg  40 mg Intravenous Daily Amin, Ankit Chirag, MD       guaiFENesin (ROBITUSSIN) 100 MG/5ML liquid 5 mL  5 mL Oral Q4H PRN Amin, Ankit Chirag, MD       hydrALAZINE (APRESOLINE) injection 10 mg  10 mg Intravenous Q4H PRN Amin, Ankit Chirag, MD       HYDROcodone-acetaminophen (NORCO/VICODIN) 5-325 MG per tablet 1 tablet  1 tablet Oral Q6H PRN Nolberto Hanlon, MD   1 tablet at 09/03/22 2047    ipratropium-albuterol (DUONEB) 0.5-2.5 (3) MG/3ML nebulizer solution 3 mL  3 mL Nebulization Q4H PRN Amin, Ankit Chirag, MD       levothyroxine (SYNTHROID) tablet 100 mcg  100 mcg Oral Daily Nolberto Hanlon, MD   100 mcg at 09/05/22 0559   metoprolol succinate (TOPROL-XL) 24 hr tablet 50 mg  50 mg Oral Daily Nolberto Hanlon, MD   50 mg at 09/04/22 0858   metoprolol tartrate (LOPRESSOR) injection 5 mg  5 mg Intravenous Q4H PRN Amin, Ankit Chirag, MD       oxyCODONE (Oxy IR/ROXICODONE) immediate release tablet 5 mg  5 mg Oral Q4H PRN Amin, Ankit Chirag, MD   5 mg at 09/05/22 1000   senna (SENOKOT) tablet 8.6 mg  1 tablet Oral Daily PRN Nolberto Hanlon, MD   8.6 mg at 09/05/22 1301   senna-docusate (Senokot-S) tablet 2 tablet  2 tablet Oral QHS PRN Amin, Ankit Chirag, MD       sodium chloride flush (NS) 0.9 % injection 3 mL  3 mL Intravenous Q12H Nolberto Hanlon, MD   3 mL at 09/05/22 1001   sulfamethoxazole-trimethoprim (BACTRIM DS) 800-160 MG per tablet 2 tablet  2 tablet Oral Q12H Amin, Ankit Chirag, MD   2 tablet at 09/05/22 1301   traZODone (DESYREL) tablet 50 mg  50 mg Oral QHS PRN Nolberto Hanlon, MD         Discharge Medications: Please see discharge summary for a list of discharge medications.  Relevant Imaging Results:  Relevant Lab Results:   Additional Information SS #: [id number]  Kasee Hantz E Vadis Slabach, LCSW

## 2022-09-05 NOTE — Plan of Care (Signed)
  Problem: Clinical Measurements: Goal: Ability to maintain clinical measurements within normal limits will improve Outcome: Progressing Goal: Will remain free from infection Outcome: Progressing   Problem: Activity: Goal: Risk for activity intolerance will decrease Outcome: Progressing   Problem: Safety: Goal: Ability to remain free from injury will improve Outcome: Progressing   Problem: Skin Integrity: Goal: Risk for impaired skin integrity will decrease Outcome: Progressing   

## 2022-09-05 NOTE — Progress Notes (Signed)
PROGRESS NOTE    KAYTON USSERY  ZOX:[id number] DOB: [birth year] DOA: 09/03/2022 PCP: Excell Seltzer, MD   Brief Narrative:  89 year old female with history of chronic hearing loss, progressive bilateral lower extremity weakness, atrial fibrillation, essential hypertension comes to the hospital for evaluation of worsening bilateral lower extremity swelling and erythema with blistering.  Initially placed on IV vancomycin.   Assessment & Plan:  Principal Problem:   Cellulitis of lower extremity Active Problems:   Essential hypertension   Fluid overload   Troponin I above reference range   A-fib (HCC)     Assessment and Plan: * Cellulitis of lower extremity This appears to be bilateral in nature with mild purulent discharge.  Initially started on vancomycin but will transition to Bactrim twice daily.  Anticipate total of 5 to 7 days.  Lower extremity Dopplers is negative for DVT.  There is also signs of volume overloaded which may have precipitated into this therefore echocardiogram ordered.  Currently no evidence of deeper infection but if if fails to improve, may be worth pursuing CT scan  A-fib [facility]), chronic Incidental finding.  Checking TSH, echocardiogram. Continue Toprol-XL Discussed risks and benefits of anticoagulation.  At this time family and patient is opting against full anticoagulation  Troponin I above reference range No prior history.  Troponins are trending down.  Remains chest pain-free.  EKG is nonischemic.  Getting echocardiogram LDL 54  Fluid overload Chronically worsening bilateral lower extremity swelling.  Echocardiogram ordered  Essential hypertension Currently on Lasix and Toprol.  Further adjust as needed.  IV as needed ordered  PT/OT-SNF.  Will consult TOC  DVT prophylaxis: Lovenox Code Status: DNR Family Communication: Son at bedside Status is: Inpatient Ongoing evaluation of fluid overload and getting antibiotics for bilateral lower extremity  swelling.       Diet Orders (From admission, onward)     Start     Ordered   09/03/22 2023  Diet 2 gram sodium Room service appropriate? Yes; Fluid consistency: Thin  Diet effective now       Question Answer Comment  Room service appropriate? Yes   Fluid consistency: Thin      09/03/22 2022            Subjective: Seen at bedside.  Remains afebrile.  Does have bilateral lower extremity swelling and mild erythema.  Currently bilateral lower extremity dressing in place. Son is in the room during my evaluation   Examination:  General exam: Appears calm and comfortable  Respiratory system: Clear to auscultation. Respiratory effort normal. Cardiovascular system: S1 & S2 heard, RRR. No JVD, murmurs, rubs, gallops or clicks. No pedal edema. Gastrointestinal system: Abdomen is nondistended, soft and nontender. No organomegaly or masses felt. Normal bowel sounds heard. Central nervous system: Alert and oriented. No focal neurological deficits. Extremities: Symmetric 5 x 5 power. Skin: Bilateral lower extremity dressing in place Psychiatry: Judgement and insight appear normal. Mood & affect appropriate.  Objective: Vitals:   09/04/22 1546 09/05/22 0057 09/05/22 0100 09/05/22 0946  BP: (!) 118/97 125/89  95/69  Pulse: 99 63 93 75  Resp: 16 18 18    Temp:  98.4 F (36.9 C) 98.4 F (36.9 C)   TempSrc:      SpO2: 97% (!) 85% 95%   Weight:      Height:       No intake or output data in the 24 hours ending 09/05/22 1307 Filed Weights   09/03/22 1333 09/03/22 2238 09/04/22 0326  Weight: 77.6  kg 78.4 kg 77.6 kg    Scheduled Meds:  aspirin EC  81 mg Oral Daily   atorvastatin  40 mg Oral Daily   enoxaparin (LOVENOX) injection  30 mg Subcutaneous Q24H   furosemide  40 mg Intravenous BID   levothyroxine  100 mcg Oral Daily   metoprolol succinate  50 mg Oral Daily   sodium chloride flush  3 mL Intravenous Q12H   sulfamethoxazole-trimethoprim  2 tablet Oral Q12H   Continuous  Infusions:  Nutritional status     Body mass index is 29.37 kg/m.  Data Reviewed:   CBC: Recent Labs  Lab 09/03/22 1337 09/03/22 2100 09/04/22 0430 09/05/22 0416  WBC 6.5 6.2 5.6 5.2  NEUTROABS 4.2  --   --  3.3  HGB 11.6* 11.5* 10.7* 10.4*  HCT 36.2 34.8* 33.1* 31.6*  MCV 96.8 97.2 96.5 96.0  PLT 180 188 166 152   Basic Metabolic Panel: Recent Labs  Lab 09/03/22 1337 09/03/22 2100 09/04/22 0430 09/05/22 0416  NA 137  --  137 139  K 3.9  --  4.0 3.8  CL 102  --  104 105  CO2 23  --  24 26  GLUCOSE 109*  --  102* 81  BUN 34*  --  32* 29*  CREATININE 1.42* 1.33* 1.29* 1.14*  CALCIUM 9.1  --  8.6* 8.6*   GFR: Estimated Creatinine Clearance: 29.1 mL/min (A) (by C-G formula based on SCr of 1.14 mg/dL (H)). Liver Function Tests: Recent Labs  Lab 09/03/22 1337  AST 19  18  ALT 12  13  ALKPHOS 107  105  BILITOT 0.8  0.8  PROT 7.6  7.6  ALBUMIN 3.8  3.7   No results for input(s): "LIPASE", "AMYLASE" in the last 168 hours. No results for input(s): "AMMONIA" in the last 168 hours. Coagulation Profile: Recent Labs  Lab 09/04/22 0430  INR 1.2   Cardiac Enzymes: No results for input(s): "CKTOTAL", "CKMB", "CKMBINDEX", "TROPONINI" in the last 168 hours. BNP (last 3 results) Recent Labs    06/29/22 1642  PROBNP 756.0*   HbA1C: No results for input(s): "HGBA1C" in the last 72 hours. CBG: No results for input(s): "GLUCAP" in the last 168 hours. Lipid Profile: Recent Labs    09/04/22 0430  CHOL 109  HDL 46  LDLCALC 54  TRIG 47  CHOLHDL 2.4   Thyroid Function Tests: Recent Labs    09/05/22 0414  TSH 1.198   Anemia Panel: No results for input(s): "VITAMINB12", "FOLATE", "FERRITIN", "TIBC", "IRON", "RETICCTPCT" in the last 72 hours. Sepsis Labs: Recent Labs  Lab 09/03/22 1337  LATICACIDVEN 1.1    Recent Results (from the past 240 hour(s))  Culture, blood (Routine X 2) w Reflex to ID Panel     Status: None (Preliminary result)    Collection Time: 09/03/22  1:37 PM   Specimen: Left Antecubital; Blood  Result Value Ref Range Status   Specimen Description LEFT ANTECUBITAL  Final   Special Requests   Final    BOTTLES DRAWN AEROBIC AND ANAEROBIC Blood Culture results may not be optimal due to an inadequate volume of blood received in culture bottles   Culture   Final    NO GROWTH 2 DAYS Performed at [facility], [address] [place], [place] [address]    Report Status PENDING  Incomplete  Culture, blood (Routine X 2) w Reflex to ID Panel     Status: None (Preliminary result)   Collection Time: 09/03/22  8:26  PM   Specimen: BLOOD  Result Value Ref Range Status   Specimen Description BLOOD BLOOD LEFT ARM  Final   Special Requests   Final    BOTTLES DRAWN AEROBIC AND ANAEROBIC Blood Culture adequate volume   Culture   Final    NO GROWTH 2 DAYS Performed at [facility], [place], [address], [place], [place] [address]    Report Status PENDING  Incomplete  Aerobic Culture w Gram Stain (superficial specimen)     Status: None (Preliminary result)   Collection Time: 09/05/22  6:00 AM   Specimen: Wound  Result Value Ref Range Status   Specimen Description   Final    WOUND LEFT LEG Performed at [facility], [address], [place], [place] [address]    Special Requests   Final    NONE Performed at [facility], [address] [place], [place] [address]    Gram Stain   Final    MODERATE STAPHYLOCOCCUS EPIDERMIDIS NO WBC SEEN FEW GRAM NEGATIVE RODS Performed at [facility], [address] [place], [place] [address]    Culture PENDING  Incomplete   Report Status PENDING  Incomplete  Aerobic Culture w Gram Stain (superficial specimen)     Status: None (Preliminary result)   Collection Time: 09/05/22  6:00 AM   Specimen: Wound  Result Value Ref Range Status   Specimen Description   Final    WOUND RIGHT LEG Performed at [facility], [address],  [place], [place] [address]    Special Requests   Final    NONE Performed at [facility], [address] [place], [place] [address]    Gram Stain   Final    RARE SQUAMOUS EPITHELIAL CELLS PRESENT NO WBC SEEN FEW GRAM NEGATIVE RODS Performed at [facility], [address] [place], [place] [address]    Culture PENDING  Incomplete   Report Status PENDING  Incomplete         Radiology Studies: US Venous Img Lower Bilateral (DVT)  Result Date: 09/03/2022 CLINICAL DATA:  Lower extremity pain and swelling EXAM: BILATERAL LOWER EXTREMITY VENOUS DOPPLER ULTRASOUND TECHNIQUE: Gray-scale sonography with compression, as well as color and duplex ultrasound, were performed to evaluate the deep venous system(s) from the level of the common femoral vein through the popliteal and proximal calf veins. COMPARISON:  05/08/2016 FINDINGS: VENOUS Normal compressibility of the common femoral, superficial femoral, and popliteal veins, as well as the visualized calf veins. Visualized portions of profunda femoral vein and great saphenous vein unremarkable. No filling defects to suggest DVT on grayscale or color Doppler imaging. Doppler waveforms show normal direction of venous flow, normal respiratory plasticity and response to augmentation. OTHER None. Limitations: none IMPRESSION: 1. No evidence of deep venous thrombosis within either lower extremity. Electronically Signed   By: Sharlet Salina M.D.   On: 09/03/2022 21:29   DG Chest Portable 1 View  Result Date: 09/03/2022 CLINICAL DATA:  Shortness of breath EXAM: PORTABLE CHEST 1 VIEW COMPARISON:  05/21/2007 FINDINGS: Transverse diameter of heart is increased. Central pulmonary vessels are more prominent. Small bilateral pleural effusions are seen. There is no focal pulmonary consolidation. There is a deformity in few left ribs suggesting old healed fractures. IMPRESSION: Cardiomegaly. Central pulmonary vessels are more prominent. There is interval  increase in interstitial markings in the parahilar regions and lower lung fields suggesting possible CHF with mild interstitial pulmonary edema. Blunting of both lateral CP angles suggests  small bilateral pleural effusions. Electronically Signed   By: Ernie Avena M.D.   On: 09/03/2022 18:10           LOS: 2 days   Time spent= 35 mins    Kiyla Ringler Joline Maxcy, MD [facility]  If 7PM-7AM, please contact night-coverage  09/05/2022, 1:07 PM

## 2022-09-05 NOTE — TOC Initial Note (Signed)
Transition of Care [facility]) - Initial/Assessment Note    Patient Details  Name: Diana [name] MRN: [id number] Date of Birth: 07-29-1923  [facility] CM/SW Contact:    Liliana Cline, LCSW Phone Number: 09/05/2022, 3:41 PM  Clinical Narrative:     Patient is from home with her son. Call to son to discuss SNF recommendation. Bruce is agreeable to SNF, he prefers [facility] or [facility]. Bruce stated he is in the process of ALF placement for patient. He understands she would need STR at a SNF then transition to ALF at the ALF he sets up.               Expected Discharge Plan: Skilled Nursing Facility Barriers to Discharge: Continued Medical Work up   Patient Goals and CMS Choice Patient states their goals for this hospitalization and ongoing recovery are:: SNF CMS Medicare.gov Compare Post Acute Care list provided to:: Patient Represenative (must comment) Choice offered to / list presented to : Adult Children      Expected Discharge Plan and Services       Living arrangements for the past 2 months: Single Family Home                                      Prior Living Arrangements/Services Living arrangements for the past 2 months: Single Family Home Lives with:: Adult Children Patient language and need for interpreter reviewed:: Yes Do you feel safe going back to the place where you live?: Yes      Need for Family Participation in Patient Care: Yes (Comment) Care giver support system in place?: Yes (comment)   Criminal Activity/Legal Involvement Pertinent to Current Situation/Hospitalization: No - Comment as needed  Activities of Daily Living Home Assistive Devices/Equipment: Dan Humphreys (specify type) ADL Screening (condition at time of admission) Patient's cognitive ability adequate to safely complete daily activities?: Yes Is the patient deaf or have difficulty hearing?: Yes Does the patient have difficulty seeing, even when wearing  glasses/contacts?: No Does the patient have difficulty concentrating, remembering, or making decisions?: Yes Patient able to express need for assistance with ADLs?: Yes Does the patient have difficulty dressing or bathing?: No Independently performs ADLs?: Yes (appropriate for developmental age) Does the patient have difficulty walking or climbing stairs?: Yes Weakness of Legs: Both Weakness of Arms/Hands: None  Permission Sought/Granted Permission sought to share information with : Facility Industrial/product designer granted to share information with : Yes, Verbal Permission Granted (by son)     Permission granted to share info w AGENCY: SNFs        Emotional Assessment         Alcohol / Substance Use: Not Applicable Psych Involvement: No (comment)  Admission diagnosis:  Cellulitis [L03.90] Atrial fibrillation, unspecified type (HCC) [I48.91] Congestive heart failure, unspecified HF chronicity, unspecified heart failure type (HCC) [I50.9] Patient Active Problem List   Diagnosis Date Noted   Fluid overload 09/03/2022   Troponin I above reference range 09/03/2022   A-fib (HCC) 09/03/2022   Cellulitis of lower extremity 08/27/2022   Venous stasis ulcers of both lower extremities (HCC) 08/27/2022   Peripheral edema 06/30/2022   Hematoma 04/13/2022   Other chronic pain 06/26/2021   Burning with urination 03/01/2018   Iron deficiency anemia due to chronic blood loss 06/16/2017   Lymphedema of right arm 11/17/2016   Vaginal irritation 10/26/2016   Right-sided low back  pain without sciatica 08/22/2015   Spinal stenosis in cervical region 08/07/2015   Cervical spondylosis with myelopathy 06/26/2015   Aortic valve calcification 06/03/2015   BPV (benign positional vertigo) 01/29/2015   Poor balance 10/07/2011   History of right breast cancer 03/29/2011   Vitamin D deficiency 10/17/2009   Prediabetes 10/17/2009   COPD (chronic obstructive pulmonary disease) (HCC)  12/19/2007   INSOMNIA, CHRONIC 10/25/2007   BACK PAIN, LUMBAR 03/01/2007   Hypothyroidism 01/18/2007   HYPONATREMIA, MILD 01/18/2007   Essential hypertension 01/18/2007   PCP:  Excell Seltzer, MD Pharmacy:   [facility] - [place], [place] - [phone], [address] [place] [address] Phone: [phone] Fax: [phone]  [facility] - [place], [place] - [address] [place] [address] Phone: [phone] Fax: [phone]     Social Determinants of Health (SDOH) Social History: SDOH Screenings   Food Insecurity: No Food Insecurity (09/03/2022)  Housing: Low Risk  (09/03/2022)  Transportation Needs: No Transportation Needs (09/03/2022)  Utilities: Not At Risk (09/03/2022)  Alcohol Screen: Low Risk  (06/10/2022)  Depression (PHQ2-9): Low Risk  (06/10/2022)  Financial Resource Strain: Low Risk  (06/10/2022)  Physical Activity: Inactive (06/10/2022)  Social Connections: Socially Isolated (06/10/2022)  Stress: No Stress Concern Present (06/10/2022)  Tobacco Use: Medium Risk (09/03/2022)   SDOH Interventions:     Readmission Risk Interventions     No data to display

## 2022-09-05 NOTE — Plan of Care (Signed)
  Problem: Clinical Measurements: Goal: Ability to maintain clinical measurements within normal limits will improve Outcome: Progressing Goal: Will remain free from infection Outcome: Progressing Goal: Respiratory complications will improve Outcome: Progressing Goal: Cardiovascular complication will be avoided Outcome: Progressing   Problem: Activity: Goal: Risk for activity intolerance will decrease Outcome: Progressing   Problem: Safety: Goal: Ability to remain free from injury will improve Outcome: Progressing   Problem: Skin Integrity: Goal: Risk for impaired skin integrity will decrease Outcome: Progressing

## 2022-09-05 NOTE — Evaluation (Signed)
Physical Therapy Evaluation Patient Details Name: Diana [name] MRN: [id number] DOB: Sep 03, 1923 Today's Date: 09/05/2022  History of Present Illness  Pt is a 89 y/o F admitted on 09/03/22 after presenting with c/o progressive LE edema x 3 months. Pt is being treated for LE cellulitis, a-fib of incidental finding. PMH: HOH  Clinical Impression  Pt seen for PT evaluation with pt agreeable to tx. Pt is limited by [facility] but reports prior to admission she was residing with her son in a 1 level home with 1 step to enter & ambulating with RW but pt's son reports pt is requiring more & more assistance that he's having more trouble providing. On this date, pt requires mod assist for STS & mod assist to take a few steps forwards & backwards. Overall activity limited by pt c/o dizziness but feels this is 2/2 taking pain meds (this has happened to pt in the past) - nurse made aware. Recommend ongoing PT services to address deficits noted below.   Recommendations for follow up therapy are one component of a multi-disciplinary discharge planning process, led by the attending physician.  Recommendations may be updated based on patient status, additional functional criteria and insurance authorization.  Follow Up Recommendations Can patient physically be transported by private vehicle: No     Assistance Recommended at Discharge Intermittent Supervision/Assistance  Patient can return home with the following  A lot of help with walking and/or transfers;A lot of help with bathing/dressing/bathroom;Assist for transportation;Assistance with cooking/housework;Direct supervision/assist for financial management;Help with stairs or ramp for entrance;Direct supervision/assist for medications management    Equipment Recommendations None recommended by PT  Recommendations for Other Services       Functional Status Assessment Patient has had a recent decline in their functional status and demonstrates the ability to make  significant improvements in function in a reasonable and predictable amount of time.     Precautions / Restrictions Precautions Precautions: Fall Restrictions Weight Bearing Restrictions: No      Mobility  Bed Mobility               General bed mobility comments: not tested, pt received & left sitting in recliner    Transfers Overall transfer level: Needs assistance Equipment used: Rolling walker (2 wheels) Transfers: Sit to/from Stand Sit to Stand: Mod assist           General transfer comment: extra time to power up to standing, cuing for hand placement during STS    Ambulation/Gait Ambulation/Gait assistance: Mod assist Gait Distance (Feet):  (2 ft forwards + 2 ft backwards) Assistive device: Rolling walker (2 wheels) Gait Pattern/deviations: Decreased step length - right, Decreased step length - left, Decreased stride length, Decreased dorsiflexion - right, Decreased dorsiflexion - left Gait velocity: decreased     General Gait Details: Posterior lean  Stairs            Wheelchair Mobility    Modified Rankin (Stroke Patients Only)       Balance Overall balance assessment: Needs assistance Sitting-balance support: Feet supported Sitting balance-Leahy Scale: Fair     Standing balance support: During functional activity, Bilateral upper extremity supported, Reliant on assistive device for balance Standing balance-Leahy Scale: Poor                               Pertinent Vitals/Pain Pain Assessment Pain Assessment: Faces Faces Pain Scale: Hurts even more Pain Location: "that left leg is killing  me", BLE pain (L>R) Pain Descriptors / Indicators: Discomfort Pain Intervention(s): Premedicated before session, Monitored during session, Limited activity within patient's tolerance    Home Living Family/patient expects to be discharged to:: Private residence Living Arrangements: Children Available Help at Discharge: Family Type of  Home: House Home Access: Stairs to enter   Secretary/administrator of Steps: 1   Home Layout: One level Home Equipment: Agricultural consultant (2 wheels)      Prior Function               Mobility Comments: Pt ambulatory with RW but has been requiring more assistance from pt's son who is having more difficulty caring for pt.       Hand Dominance        Extremity/Trunk Assessment   Upper Extremity Assessment Upper Extremity Assessment: Generalized weakness    Lower Extremity Assessment Lower Extremity Assessment: Generalized weakness       Communication   Communication: HOH (extremely HOH)  Cognition Arousal/Alertness: Awake/alert Behavior During Therapy: WFL for tasks assessed/performed Overall Cognitive Status: Difficult to assess                                 General Comments: follows simple commands with cuing & extra time throughout session        General Comments      Exercises     Assessment/Plan    PT Assessment Patient needs continued PT services  PT Problem List Decreased strength;Pain;Cardiopulmonary status limiting activity;Decreased range of motion;Decreased activity tolerance;Decreased knowledge of use of DME;Decreased balance;Decreased safety awareness;Decreased mobility;Decreased skin integrity       PT Treatment Interventions DME instruction;Therapeutic exercise;Gait training;Balance training;Neuromuscular re-education;Stair training;Functional mobility training;Therapeutic activities;Patient/family education;Modalities;Manual techniques    PT Goals (Current goals can be found in the Care Plan section)  Acute Rehab PT Goals Patient Stated Goal: decreased pain, get better PT Goal Formulation: With patient/family Time For Goal Achievement: 09/19/22 Potential to Achieve Goals: Good    Frequency Min 3X/week     Co-evaluation               AM-PAC PT "6 Clicks" Mobility  Outcome Measure Help needed turning from your  back to your side while in a flat bed without using bedrails?: A Little Help needed moving from lying on your back to sitting on the side of a flat bed without using bedrails?: A Lot Help needed moving to and from a bed to a chair (including a wheelchair)?: A Lot Help needed standing up from a chair using your arms (e.g., wheelchair or bedside chair)?: A Lot Help needed to walk in hospital room?: A Lot Help needed climbing 3-5 steps with a railing? : Total 6 Click Score: 12    End of Session   Activity Tolerance:  (limited by dizziness & pt & son believe this is 2/2 taking pain meds prior to PT session (pain meds have caused dizziness in the past)) Patient left: in chair;with family/visitor present   PT Visit Diagnosis: Unsteadiness on feet (R26.81);Muscle weakness (generalized) (M62.81);Pain;Other abnormalities of gait and mobility (R26.89);Difficulty in walking, not elsewhere classified (R26.2) Pain - Right/Left:  (bilateral) Pain - part of body: Leg    Time: 1040-1051 PT Time Calculation (min) (ACUTE ONLY): 11 min   Charges:   PT Evaluation $PT Eval Low Complexity: 1 Low          Aleda Grana, PT, DPT 09/05/22, 11:00 AM   [place]  Paul Half 09/05/2022, 10:57 AM

## 2022-09-06 DIAGNOSIS — I509 Heart failure, unspecified: Secondary | ICD-10-CM

## 2022-09-06 DIAGNOSIS — L03119 Cellulitis of unspecified part of limb: Secondary | ICD-10-CM | POA: Diagnosis not present

## 2022-09-06 LAB — BASIC METABOLIC PANEL
Anion gap: 8 (ref 5–15)
BUN: 28 mg/dL — ABNORMAL HIGH (ref 8–23)
CO2: 26 mmol/L (ref 22–32)
Calcium: 8.5 mg/dL — ABNORMAL LOW (ref 8.9–10.3)
Chloride: 104 mmol/L (ref 98–111)
Creatinine, Ser: 1.44 mg/dL — ABNORMAL HIGH (ref 0.44–1.00)
GFR, Estimated: 33 mL/min — ABNORMAL LOW (ref >60)
Glucose, Bld: 90 mg/dL (ref 70–99)
Potassium: 3.7 mmol/L (ref 3.5–5.1)
Sodium: 138 mmol/L (ref 135–145)

## 2022-09-06 LAB — CBC WITH DIFFERENTIAL/PLATELET
Abs Immature Granulocytes: 0.01 10*3/uL (ref 0.00–0.07)
Basophils Absolute: 0 10*3/uL (ref 0.0–0.1)
Basophils Relative: 0 %
Eosinophils Absolute: 0.1 10*3/uL (ref 0.0–0.5)
Eosinophils Relative: 2 %
HCT: 30.2 % — ABNORMAL LOW (ref 36.0–46.0)
Hemoglobin: 10.1 g/dL — ABNORMAL LOW (ref 12.0–15.0)
Immature Granulocytes: 0 %
Lymphocytes Relative: 14 %
Lymphs Abs: 0.7 10*3/uL (ref 0.7–4.0)
MCH: 32.1 pg (ref 26.0–34.0)
MCHC: 33.4 g/dL (ref 30.0–36.0)
MCV: 95.9 fL (ref 80.0–100.0)
Monocytes Absolute: 0.8 10*3/uL (ref 0.1–1.0)
Monocytes Relative: 16 %
Neutro Abs: 3.6 10*3/uL (ref 1.7–7.7)
Neutrophils Relative %: 68 %
Platelets: 156 10*3/uL (ref 150–400)
RBC: 3.15 MIL/uL — ABNORMAL LOW (ref 3.87–5.11)
RDW: 13.8 % (ref 11.5–15.5)
WBC: 5.3 10*3/uL (ref 4.0–10.5)
nRBC: 0 % (ref 0.0–0.2)

## 2022-09-06 LAB — AEROBIC CULTURE W GRAM STAIN (SUPERFICIAL SPECIMEN)

## 2022-09-06 LAB — CULTURE, BLOOD (ROUTINE X 2): Culture: NO GROWTH

## 2022-09-06 LAB — MAGNESIUM: Magnesium: 1.9 mg/dL (ref 1.7–2.4)

## 2022-09-06 MED ORDER — SODIUM CHLORIDE 0.9 % IV BOLUS
250.0000 mL | Freq: Once | INTRAVENOUS | Status: AC
  Administered 2022-09-06: 250 mL via INTRAVENOUS

## 2022-09-06 MED ORDER — METOPROLOL TARTRATE 5 MG/5ML IV SOLN
2.5000 mg | Freq: Once | INTRAVENOUS | Status: AC
  Administered 2022-09-06: 2.5 mg via INTRAVENOUS
  Filled 2022-09-06: qty 5

## 2022-09-06 MED ORDER — SODIUM CHLORIDE 0.9 % IV BOLUS
500.0000 mL | Freq: Once | INTRAVENOUS | Status: AC
  Administered 2022-09-06: 500 mL via INTRAVENOUS

## 2022-09-06 MED ORDER — POTASSIUM CHLORIDE CRYS ER 20 MEQ PO TBCR
40.0000 meq | EXTENDED_RELEASE_TABLET | Freq: Once | ORAL | Status: AC
  Administered 2022-09-06: 40 meq via ORAL
  Filled 2022-09-06: qty 2

## 2022-09-06 MED ORDER — CIPROFLOXACIN HCL 500 MG PO TABS
500.0000 mg | ORAL_TABLET | Freq: Every day | ORAL | Status: DC
  Administered 2022-09-06 – 2022-09-10 (×5): 500 mg via ORAL
  Filled 2022-09-06 (×5): qty 1

## 2022-09-06 NOTE — Care Management Important Message (Signed)
Important Message  Patient Details  Name: Diana [name] MRN: [id number] Date of Birth: 05/25/1923   Medicare Important Message Given:  N/A - LOS <3 / Initial given by admissions     [name] 09/06/2022, 10:07 AM

## 2022-09-06 NOTE — Progress Notes (Signed)
PROGRESS NOTE    Diana [name]  ZOX:[id number] DOB: 02-Aug-1923 DOA: 09/03/2022 PCP: Excell Seltzer, MD   Brief Narrative:  89 year old female with history of chronic hearing loss, progressive bilateral lower extremity weakness, atrial fibrillation, essential hypertension comes to the hospital for evaluation of worsening bilateral lower extremity swelling and erythema with blistering.  Initially placed on IV vancomycin > PO Bactrim.  Eventually cultures grew Pseudomonas therefore transition to Cipro.   Assessment & Plan:  Principal Problem:   Cellulitis of lower extremity Active Problems:   Essential hypertension   Fluid overload   Troponin I above reference range   A-fib (HCC)     Assessment and Plan: * Cellulitis of lower extremity This appears to be bilateral in nature with mild purulent discharge.  Lower extremity Dopplers are negative for DVT.  Wound cultures is growing Pseudomonas.  Transition Bactrim to oral Cipro and await further sensitivities.   Currently no evidence of deeper infection but if if fails to improve, may be worth pursuing CT scan  A-fib [facility]), chronic Incidental finding.  TSH normal.  Echocardiogram pending Continue Toprol-XL Discussed risks and benefits of anticoagulation.  At this time family and patient is opting against full anticoagulation  Troponin I above reference range No prior history.  Troponins are trending down.  Remains chest pain-free.  EKG is nonischemic.  Echocardiogram pending LDL 54  Fluid overload Chronically worsening bilateral lower extremity swelling.  Echocardiogram pending  Essential hypertension Currently on Lasix and Toprol.  Further adjust as needed.  IV as needed ordered  PT/OT-SNF.  Will consult TOC  DVT prophylaxis: Lovenox Code Status: DNR Family Communication: Son at bedside Status is: Inpatient Ongoing evaluation of bilateral lower extremity wound infection and fluid overload      Diet Orders (From  admission, onward)     Start     Ordered   09/03/22 2023  Diet 2 gram sodium Room service appropriate? Yes; Fluid consistency: Thin  Diet effective now       Question Answer Comment  Room service appropriate? Yes   Fluid consistency: Thin      09/03/22 2022            Subjective: Overnight had an episode of atrial fibrillation with RVR requiring Lopressor This morning doing well.  Does not have any complaints.   Examination:  Constitutional: Not in acute distress Respiratory: Clear to auscultation bilaterally Cardiovascular: Normal sinus rhythm, no rubs Abdomen: Nontender nondistended good bowel sounds Musculoskeletal: No edema noted Skin: No rashes seen Neurologic: CN 2-12 grossly intact.  And nonfocal Psychiatric: Normal judgment and insight. Alert and oriented x 3. Normal mood.  Objective: Vitals:   09/06/22 0458 09/06/22 0701 09/06/22 0702 09/06/22 0753  BP: (!) 96/55 107/72  137/63  Pulse: (!) 109 69  74  Resp: 20 18  14   Temp: 98.8 F (37.1 C) 98.4 F (36.9 C)  99.1 F (37.3 C)  TempSrc:      SpO2: 94% 91%  95%  Weight:   83.6 kg   Height:       No intake or output data in the 24 hours ending 09/06/22 0850 Filed Weights   09/03/22 2238 09/04/22 0326 09/06/22 0702  Weight: 78.4 kg 77.6 kg 83.6 kg    Scheduled Meds:  aspirin EC  81 mg Oral Daily   atorvastatin  40 mg Oral Daily   enoxaparin (LOVENOX) injection  30 mg Subcutaneous Q24H   furosemide  40 mg Intravenous Daily   levothyroxine  100 mcg Oral Daily   metoprolol succinate  50 mg Oral Daily   potassium chloride  40 mEq Oral Once   sodium chloride flush  3 mL Intravenous Q12H   sulfamethoxazole-trimethoprim  2 tablet Oral Q12H   Continuous Infusions:  Nutritional status     Body mass index is 31.64 kg/m.  Data Reviewed:   CBC: Recent Labs  Lab 09/03/22 1337 09/03/22 2100 09/04/22 0430 09/05/22 0416 09/06/22 0421  WBC 6.5 6.2 5.6 5.2 5.3  NEUTROABS 4.2  --   --  3.3 3.6  HGB  11.6* 11.5* 10.7* 10.4* 10.1*  HCT 36.2 34.8* 33.1* 31.6* 30.2*  MCV 96.8 97.2 96.5 96.0 95.9  PLT 180 188 166 152 156   Basic Metabolic Panel: Recent Labs  Lab 09/03/22 1337 09/03/22 2100 09/04/22 0430 09/05/22 0416 09/06/22 0421  NA 137  --  137 139 138  K 3.9  --  4.0 3.8 3.7  CL 102  --  104 105 104  CO2 23  --  24 26 26   GLUCOSE 109*  --  102* 81 90  BUN 34*  --  32* 29* 28*  CREATININE 1.42* 1.33* 1.29* 1.14* 1.44*  CALCIUM 9.1  --  8.6* 8.6* 8.5*  MG  --   --   --   --  1.9   GFR: Estimated Creatinine Clearance: 23.9 mL/min (A) (by C-G formula based on SCr of 1.44 mg/dL (H)). Liver Function Tests: Recent Labs  Lab 09/03/22 1337  AST 19  18  ALT 12  13  ALKPHOS 107  105  BILITOT 0.8  0.8  PROT 7.6  7.6  ALBUMIN 3.8  3.7   No results for input(s): "LIPASE", "AMYLASE" in the last 168 hours. No results for input(s): "AMMONIA" in the last 168 hours. Coagulation Profile: Recent Labs  Lab 09/04/22 0430  INR 1.2   Cardiac Enzymes: No results for input(s): "CKTOTAL", "CKMB", "CKMBINDEX", "TROPONINI" in the last 168 hours. BNP (last 3 results) Recent Labs    06/29/22 1642  PROBNP 756.0*   HbA1C: No results for input(s): "HGBA1C" in the last 72 hours. CBG: No results for input(s): "GLUCAP" in the last 168 hours. Lipid Profile: Recent Labs    09/04/22 0430  CHOL 109  HDL 46  LDLCALC 54  TRIG 47  CHOLHDL 2.4   Thyroid Function Tests: Recent Labs    09/05/22 0414  TSH 1.198   Anemia Panel: No results for input(s): "VITAMINB12", "FOLATE", "FERRITIN", "TIBC", "IRON", "RETICCTPCT" in the last 72 hours. Sepsis Labs: Recent Labs  Lab 09/03/22 1337  LATICACIDVEN 1.1    Recent Results (from the past 240 hour(s))  Culture, blood (Routine X 2) w Reflex to ID Panel     Status: None (Preliminary result)   Collection Time: 09/03/22  1:37 PM   Specimen: Left Antecubital; Blood  Result Value Ref Range Status   Specimen Description LEFT  ANTECUBITAL  Final   Special Requests   Final    BOTTLES DRAWN AEROBIC AND ANAEROBIC Blood Culture results may not be optimal due to an inadequate volume of blood received in culture bottles   Culture   Final    NO GROWTH 2 DAYS Performed at [facility], [address], [place], [place] [address]    Report Status PENDING  Incomplete  Culture, blood (Routine X 2) w Reflex to ID Panel     Status: None (Preliminary result)   Collection Time: 09/03/22  8:26 PM   Specimen: BLOOD  Result Value Ref Range Status   Specimen Description BLOOD BLOOD LEFT ARM  Final   Special Requests   Final    BOTTLES DRAWN AEROBIC AND ANAEROBIC Blood Culture adequate volume   Culture   Final    NO GROWTH 2 DAYS Performed at [facility], [address] [place], [place] [address]    Report Status PENDING  Incomplete  Aerobic Culture w Gram Stain (superficial specimen)     Status: None (Preliminary result)   Collection Time: 09/05/22  6:00 AM   Specimen: Wound  Result Value Ref Range Status   Specimen Description   Final    WOUND LEFT LEG Performed at [facility], [address], [place], [place] [address]    Special Requests   Final    NONE Performed at [facility], [address] [place], [place] [address]    Gram Stain   Final    MODERATE STAPHYLOCOCCUS EPIDERMIDIS NO WBC SEEN FEW GRAM NEGATIVE RODS    Culture   Final    FEW PSEUDOMONAS AERUGINOSA SUSCEPTIBILITIES TO FOLLOW Performed at [facility], [address] [place], [place] [address]    Report Status PENDING  Incomplete  Aerobic Culture w Gram Stain (superficial specimen)     Status: None (Preliminary result)   Collection Time: 09/05/22  6:00 AM   Specimen: Wound  Result Value Ref Range Status   Specimen Description   Final    WOUND RIGHT LEG Performed at [facility], [address], [place], [place] [address]    Special Requests   Final    NONE Performed at [facility], [address] [place], [place] [address]    Gram Stain   Final    RARE SQUAMOUS EPITHELIAL CELLS PRESENT NO WBC SEEN FEW GRAM NEGATIVE RODS Performed at [facility], [address], [place], [place] [address]    Culture MODERATE PSEUDOMONAS AERUGINOSA  Final   Report Status PENDING  Incomplete         Radiology Studies: No results found.         LOS: 3 days   Time spent= 35 mins    Ethelyne Erich Joline Maxcy, MD [facility]  If 7PM-7AM, please contact night-coverage  09/06/2022, 8:50 AM

## 2022-09-06 NOTE — Evaluation (Signed)
Occupational Therapy Evaluation Patient Details Name: Diana [name] MRN: [id number] DOB: [birth year] Today's Date: 09/06/2022   History of Present Illness Pt is a 89 y/o F admitted on 09/03/22 after presenting with c/o progressive LE edema x 3 months. Pt is being treated for LE cellulitis, a-fib of incidental finding. PMH: HOH   Clinical Impression   Patient presenting with decreased Ind in self care,balance, functional mobility/transfers, endurance, and safety awareness. Patient's son present during session and reports he lives with pt but things are becoming more and more difficult at home. Pt endorses being Ind with sink bathing and son assisting with IADL tasks. Pt normally ambulates with RW in the home except secondary to small space using SPC to enter and exit bathroom for toileting needs. Patient currently functioning at min A overall during session for self care tasks and functional mobility with use of RW this session. Patient will benefit from acute OT to increase overall independence in the areas of ADLs, functional mobility, and safety awareness in order to safely discharge.     Recommendations for follow up therapy are one component of a multi-disciplinary discharge planning process, led by the attending physician.  Recommendations may be updated based on patient status, additional functional criteria and insurance authorization.   Assistance Recommended at Discharge Intermittent Supervision/Assistance  Patient can return home with the following A little help with walking and/or transfers;A little help with bathing/dressing/bathroom;Assistance with cooking/housework;Assist for transportation;Help with stairs or ramp for entrance    Functional Status Assessment  Patient has had a recent decline in their functional status and demonstrates the ability to make significant improvements in function in a reasonable and predictable amount of time.  Equipment Recommendations  Other  (comment) (defer to next venue of care)    Recommendations for Other Services       Precautions / Restrictions Precautions Precautions: Fall      Mobility Bed Mobility               General bed mobility comments: Pt seated on EOB with son present in room and left in recliner chair    Transfers Overall transfer level: Needs assistance Equipment used: Rolling walker (2 wheels) Transfers: Sit to/from Stand Sit to Stand: Min assist                  Balance Overall balance assessment: Needs assistance Sitting-balance support: Feet supported Sitting balance-Leahy Scale: Good Sitting balance - Comments: seated on EOB   Standing balance support: During functional activity, Bilateral upper extremity supported, Reliant on assistive device for balance Standing balance-Leahy Scale: Poor                             ADL either performed or assessed with clinical judgement   ADL Overall ADL's : Needs assistance/impaired     Grooming: Wash/dry hands;Wash/dry face;Standing;Min guard                   Statistician: Nurse, adult (2 wheels) Statistician Details (indicate cue type and reason): simulated                 Vision Patient Visual Report: No change from baseline              Pertinent Vitals/Pain Pain Assessment Pain Assessment: No/denies pain     Hand Dominance Right   Extremity/Trunk Assessment Upper Extremity Assessment Upper Extremity Assessment: Generalized weakness   Lower Extremity Assessment  Lower Extremity Assessment: Generalized weakness       Communication Communication Communication: HOH   Cognition Arousal/Alertness: Awake/alert Behavior During Therapy: WFL for tasks assessed/performed Overall Cognitive Status: Within Functional Limits for tasks assessed                                 General Comments: follows simple commands with cuing & extra time throughout session.  Son present during evaluation.                Home Living Family/patient expects to be discharged to:: Private residence Living Arrangements: Children Available Help at Discharge: Family Type of Home: House Home Access: Stairs to enter Secretary/administrator of Steps: 1   Home Layout: One level     Bathroom Shower/Tub: Sponge bathes at baseline         Home Equipment: Agricultural consultant (2 wheels);Grab bars - toilet;Wheelchair - manual;Cane - single point          Prior Functioning/Environment               Mobility Comments: Pt ambulatory with RW but has been requiring more assistance from pt's son who is having more difficulty caring for pt. ADLs Comments: Pt's son assist with IADL tasks, pt is able to perform ADLs at sink side without assistance, transfers to wheelchair if leaving home for safety. Ambulation with RW other than to get into bathroom and uses SPC.        OT Problem List: Decreased strength;Decreased activity tolerance;Decreased safety awareness;Impaired balance (sitting and/or standing);Decreased knowledge of use of DME or AE      OT Treatment/Interventions: Self-care/ADL training;Therapeutic exercise;Therapeutic activities;DME and/or AE instruction;Patient/family education;Balance training;Energy conservation    OT Goals(Current goals can be found in the care plan section) Acute Rehab OT Goals Patient Stated Goal: to get stronger and go to rehab OT Goal Formulation: With family Time For Goal Achievement: 09/20/22 Potential to Achieve Goals: Fair ADL Goals Pt Will Perform Grooming: with modified independence;standing Pt Will Perform Lower Body Dressing: with supervision;sit to/from stand Pt Will Transfer to Toilet: with modified independence;ambulating Pt Will Perform Toileting - Clothing Manipulation and hygiene: with supervision;sit to/from stand  OT Frequency: Min 2X/week       AM-PAC OT "6 Clicks" Daily Activity     Outcome Measure Help  from another person eating meals?: None Help from another person taking care of personal grooming?: A Little Help from another person toileting, which includes using toliet, bedpan, or urinal?: A Lot Help from another person bathing (including washing, rinsing, drying)?: A Lot Help from another person to put on and taking off regular upper body clothing?: A Little Help from another person to put on and taking off regular lower body clothing?: A Lot 6 Click Score: 16   End of Session Equipment Utilized During Treatment: Rolling walker (2 wheels)  Activity Tolerance: Patient tolerated treatment well Patient left: with call bell/phone within reach;in chair;with chair alarm set  OT Visit Diagnosis: Unsteadiness on feet (R26.81);Muscle weakness (generalized) (M62.81)                Time: 1610-9604 OT Time Calculation (min): 14 min Charges:  OT General Charges $OT Visit: 1 Visit OT Evaluation $OT Eval Moderate Complexity: 1 Mod OT Treatments $Therapeutic Activity: 8-22 mins  Jackquline Denmark, MS, OTR/L , CBIS ascom [phone]  09/06/22, 12:41 PM

## 2022-09-06 NOTE — Progress Notes (Signed)
       CROSS COVER NOTE  NAME: Diana [name] MRN: [id number] DOB : [birth year] ATTENDING PHYSICIAN: Dimple Nanas, MD    #Atrial Fibrillation with Rapid Ventricular Response HR 137, BP 92/66 MAP 75 EKG showing Atrial Fibrillation with Rapid Ventricular Response with rate of 103 AFTER 2.5 mg of IV metoprolol. No documented history of atrial fibrillation before this hospitalization. Patient and family has declined anticoagulation per rounders note.   - 2.5 mg IV Metoprolol - 250 mL NS bolus   To reach the provider On-Call:   7AM- 7PM see care teams to locate the attending and reach out to them via [email or web address] [email or web address] 7PM-7AM contact night-coverage If you still have difficulty reaching the appropriate provider, please page the [facility] (Director on Call) for [facility] on amion for assistance  This document was prepared using Conservation officer, historic buildings and may include unintentional dictation errors.  Bishop Limbo DNP, MBA, FNP-BC, PMHNP-BC Nurse Practitioner [facility] Pager [phone]

## 2022-09-06 NOTE — Plan of Care (Signed)
Patient A&Ox4, from home, up with assist and walker in room, using commode d/t SOB on exertion.

## 2022-09-06 NOTE — Progress Notes (Signed)
Physical Therapy Treatment Patient Details Name: Diana [name] MRN: [id number] DOB: 1924-01-20 Today's Date: 09/06/2022   History of Present Illness Pt is a 89 y/o F admitted on 09/03/22 after presenting with c/o progressive LE edema x 3 months. Pt is being treated for LE cellulitis, a-fib of incidental finding. PMH: HOH    PT Comments    Pt is making good progress towards goals, however still requires close assistance for mobility efforts. RW used and cues for safety. Will continue to progress as able.  Recommendations for follow up therapy are one component of a multi-disciplinary discharge planning process, led by the attending physician.  Recommendations may be updated based on patient status, additional functional criteria and insurance authorization.  Follow Up Recommendations  Can patient physically be transported by private vehicle: Yes    Assistance Recommended at Discharge Intermittent Supervision/Assistance  Patient can return home with the following A little help with walking and/or transfers;A little help with bathing/dressing/bathroom;Help with stairs or ramp for entrance   Equipment Recommendations  None recommended by PT    Recommendations for Other Services       Precautions / Restrictions Precautions Precautions: Fall Restrictions Weight Bearing Restrictions: No     Mobility  Bed Mobility Overal bed mobility: Needs Assistance Bed Mobility: Supine to Sit     Supine to sit: Min guard     General bed mobility comments: safe technique and follows commands well    Transfers Overall transfer level: Needs assistance Equipment used: Rolling walker (2 wheels) Transfers: Sit to/from Stand Sit to Stand: Min assist           General transfer comment: needs assist for lift off and cues for safe hand placement    Ambulation/Gait Ambulation/Gait assistance: Min guard Gait Distance (Feet): 40 Feet Assistive device: Rolling walker (2 wheels) Gait  Pattern/deviations: Decreased step length - right, Decreased step length - left, Decreased stride length, Decreased dorsiflexion - right, Decreased dorsiflexion - left       General Gait Details: ambulated in room with close guard. RW used   Social research officer, government Rankin (Stroke Patients Only)       Balance Overall balance assessment: Needs assistance Sitting-balance support: Feet supported Sitting balance-Leahy Scale: Good Sitting balance - Comments: seated on EOB   Standing balance support: During functional activity, Bilateral upper extremity supported, Reliant on assistive device for balance Standing balance-Leahy Scale: Fair                              Cognition Arousal/Alertness: Awake/alert Behavior During Therapy: WFL for tasks assessed/performed Overall Cognitive Status: Within Functional Limits for tasks assessed                                 General Comments: follows commands and oriented        Exercises Other Exercises Other Exercises: alt B LE LAQ x 10 reps with cga. Other Exercises: ambulated to bathroom with cga and RW. Safe technique with ability to perform transfers  with cga and supervision for hygiene.    General Comments        Pertinent Vitals/Pain Pain Assessment Pain Assessment: No/denies pain    Home Living Family/patient expects to be discharged to:: Private residence Living Arrangements: Children Available Help at Discharge: Family Type  of Home: House Home Access: Stairs to enter   [employer] of Steps: 1   Home Layout: One level Home Equipment: Agricultural consultant (2 wheels);Grab bars - toilet;Wheelchair - manual;Cane - single point      Prior Function            PT Goals (current goals can now be found in the care plan section) Acute Rehab PT Goals Patient Stated Goal: go home PT Goal Formulation: With patient/family Time For Goal Achievement:  09/19/22 Potential to Achieve Goals: Good Progress towards PT goals: Progressing toward goals    Frequency    Min 3X/week      PT Plan Current plan remains appropriate    Co-evaluation              AM-PAC PT "6 Clicks" Mobility   Outcome Measure  Help needed turning from your back to your side while in a flat bed without using bedrails?: A Little Help needed moving from lying on your back to sitting on the side of a flat bed without using bedrails?: A Little Help needed moving to and from a bed to a chair (including a wheelchair)?: A Little Help needed standing up from a chair using your arms (e.g., wheelchair or bedside chair)?: A Little Help needed to walk in hospital room?: A Little Help needed climbing 3-5 steps with a railing? : A Lot 6 Click Score: 17    End of Session Equipment Utilized During Treatment: Gait belt Activity Tolerance: Patient tolerated treatment well Patient left: in bed;with bed alarm set Nurse Communication: Mobility status PT Visit Diagnosis: Unsteadiness on feet (R26.81);Muscle weakness (generalized) (M62.81);Pain;Other abnormalities of gait and mobility (R26.89);Difficulty in walking, not elsewhere classified (R26.2) Pain - part of body: Leg     Time: 1610-9604 PT Time Calculation (min) (ACUTE ONLY): 23 min  Charges:  $Gait Training: 8-22 mins $Therapeutic Activity: 8-22 mins                     Elizabeth Palau, PT, DPT, GCS [phone]    Clotilde Loth 09/06/2022, 2:45 PM

## 2022-09-06 NOTE — Progress Notes (Signed)
Arrived to patients room. Patient up at the sink doing patient care. Instructed SO when done to have patient return to bed for PIV placement. VU. Tomasita Morrow. RN VAST

## 2022-09-07 ENCOUNTER — Inpatient Hospital Stay (HOSPITAL_COMMUNITY)
Admit: 2022-09-07 | Discharge: 2022-09-07 | Disposition: A | Discharge status: Home / Self Care | Payer: Medicare Other | Attending: Internal Medicine | Admitting: Internal Medicine

## 2022-09-07 ENCOUNTER — Inpatient Hospital Stay
Admit: 2022-09-07 | Discharge: 2022-09-07 | Disposition: A | Discharge status: Home / Self Care | Payer: Medicare Other | Attending: Internal Medicine | Admitting: Internal Medicine

## 2022-09-07 ENCOUNTER — Ambulatory Visit: Admission: RE | Admit: 2022-09-07 | Payer: Medicare Other | Source: Ambulatory Visit

## 2022-09-07 ENCOUNTER — Inpatient Hospital Stay: Payer: Medicare Other

## 2022-09-07 DIAGNOSIS — I10 Essential (primary) hypertension: Secondary | ICD-10-CM | POA: Diagnosis not present

## 2022-09-07 DIAGNOSIS — I350 Nonrheumatic aortic (valve) stenosis: Secondary | ICD-10-CM

## 2022-09-07 DIAGNOSIS — R0609 Other forms of dyspnea: Secondary | ICD-10-CM | POA: Diagnosis not present

## 2022-09-07 DIAGNOSIS — I5020 Unspecified systolic (congestive) heart failure: Secondary | ICD-10-CM

## 2022-09-07 DIAGNOSIS — I4891 Unspecified atrial fibrillation: Secondary | ICD-10-CM | POA: Diagnosis not present

## 2022-09-07 DIAGNOSIS — L03119 Cellulitis of unspecified part of limb: Secondary | ICD-10-CM | POA: Diagnosis not present

## 2022-09-07 LAB — CBC WITH DIFFERENTIAL/PLATELET
Abs Immature Granulocytes: 0.02 10*3/uL (ref 0.00–0.07)
Basophils Absolute: 0 10*3/uL (ref 0.0–0.1)
Basophils Relative: 0 %
Eosinophils Absolute: 0 10*3/uL (ref 0.0–0.5)
Eosinophils Relative: 0 %
HCT: 30.8 % — ABNORMAL LOW (ref 36.0–46.0)
Hemoglobin: 10.1 g/dL — ABNORMAL LOW (ref 12.0–15.0)
Immature Granulocytes: 0 %
Lymphocytes Relative: 11 %
Lymphs Abs: 0.8 10*3/uL (ref 0.7–4.0)
MCH: 31.3 pg (ref 26.0–34.0)
MCHC: 32.8 g/dL (ref 30.0–36.0)
MCV: 95.4 fL (ref 80.0–100.0)
Monocytes Absolute: 0.9 10*3/uL (ref 0.1–1.0)
Monocytes Relative: 12 %
Neutro Abs: 5.2 10*3/uL (ref 1.7–7.7)
Neutrophils Relative %: 77 %
Platelets: 149 10*3/uL — ABNORMAL LOW (ref 150–400)
RBC: 3.23 MIL/uL — ABNORMAL LOW (ref 3.87–5.11)
RDW: 14.2 % (ref 11.5–15.5)
WBC: 6.9 10*3/uL (ref 4.0–10.5)
nRBC: 0 % (ref 0.0–0.2)

## 2022-09-07 LAB — ECHOCARDIOGRAM COMPLETE
AR max vel: 0.82 cm2
AV Area VTI: 1.02 cm2
AV Area mean vel: 0.85 cm2
AV Mean grad: 8.7 mmHg
AV Peak grad: 15.2 mmHg
Ao pk vel: 1.95 m/s
Area-P 1/2: 7.74 cm2
Height: 64 in
S' Lateral: 4.6 cm
Weight: 2948.87 oz

## 2022-09-07 LAB — BASIC METABOLIC PANEL
Anion gap: 10 (ref 5–15)
BUN: 29 mg/dL — ABNORMAL HIGH (ref 8–23)
CO2: 23 mmol/L (ref 22–32)
Calcium: 8.5 mg/dL — ABNORMAL LOW (ref 8.9–10.3)
Chloride: 103 mmol/L (ref 98–111)
Creatinine, Ser: 1.38 mg/dL — ABNORMAL HIGH (ref 0.44–1.00)
GFR, Estimated: 35 mL/min — ABNORMAL LOW (ref >60)
Glucose, Bld: 91 mg/dL (ref 70–99)
Potassium: 4.3 mmol/L (ref 3.5–5.1)
Sodium: 136 mmol/L (ref 135–145)

## 2022-09-07 LAB — PROTIME-INR
INR: 1.3 — ABNORMAL HIGH (ref 0.8–1.2)
Prothrombin Time: 16.5 seconds — ABNORMAL HIGH (ref 11.4–15.2)

## 2022-09-07 LAB — MAGNESIUM: Magnesium: 2.1 mg/dL (ref 1.7–2.4)

## 2022-09-07 LAB — AEROBIC CULTURE W GRAM STAIN (SUPERFICIAL SPECIMEN)

## 2022-09-07 LAB — HEPARIN LEVEL (UNFRACTIONATED): Heparin Unfractionated: 0.7 IU/mL (ref 0.30–0.70)

## 2022-09-07 LAB — BRAIN NATRIURETIC PEPTIDE: B Natriuretic Peptide: 1384.5 pg/mL — ABNORMAL HIGH (ref 0.0–100.0)

## 2022-09-07 LAB — APTT: aPTT: 38 seconds — ABNORMAL HIGH (ref 24–36)

## 2022-09-07 MED ORDER — POLYETHYLENE GLYCOL 3350 17 G PO PACK
17.0000 g | PACK | Freq: Two times a day (BID) | ORAL | Status: DC
  Administered 2022-09-07 – 2022-09-09 (×4): 17 g via ORAL
  Filled 2022-09-07 (×4): qty 1

## 2022-09-07 MED ORDER — HEPARIN BOLUS VIA INFUSION
3500.0000 [IU] | Freq: Once | INTRAVENOUS | Status: AC
  Administered 2022-09-07: 3500 [IU] via INTRAVENOUS
  Filled 2022-09-07: qty 3500

## 2022-09-07 MED ORDER — HEPARIN (PORCINE) 25000 UT/250ML-% IV SOLN
1000.0000 [IU]/h | INTRAVENOUS | Status: DC
  Administered 2022-09-07 – 2022-09-09 (×3): 1000 [IU]/h via INTRAVENOUS
  Filled 2022-09-07 (×3): qty 250

## 2022-09-07 MED ORDER — METOPROLOL TARTRATE 25 MG PO TABS
12.5000 mg | ORAL_TABLET | Freq: Four times a day (QID) | ORAL | Status: DC
  Administered 2022-09-07 – 2022-09-08 (×4): 12.5 mg via ORAL
  Filled 2022-09-07 (×4): qty 1

## 2022-09-07 MED ORDER — FUROSEMIDE 10 MG/ML IJ SOLN
40.0000 mg | Freq: Two times a day (BID) | INTRAMUSCULAR | Status: DC
  Administered 2022-09-07 – 2022-09-09 (×6): 40 mg via INTRAVENOUS
  Filled 2022-09-07 (×7): qty 4

## 2022-09-07 MED ORDER — BISACODYL 5 MG PO TBEC
10.0000 mg | DELAYED_RELEASE_TABLET | Freq: Once | ORAL | Status: AC
  Administered 2022-09-07: 10 mg via ORAL
  Filled 2022-09-07: qty 2

## 2022-09-07 NOTE — Consult Note (Signed)
Cardiology Consultation   Patient ID: SATSUKI [name] MRN: [id number]; DOB: November 22, 1923  Admit date: 09/03/2022 Date of Consult: 09/07/2022  PCP:  Excell Seltzer, MD   [facility] Providers Cardiologist:  None      New Consult done by Dr End  Patient Profile:   Diana [name] is a 89 y.o. female with a hx of arthritis, breast cancer, heart murmur, chronic bronchitis, restless leg, essential hypertension, hypothyroidism, former smoker, who is being seen 09/07/2022 for the evaluation of new onset atrial fibrillation at the request of Dr. Nelson Chimes.  History of Present Illness:   Diana [name] has recently been following up with her PCP on 08/27/2022 because he was concerned for new onset congestive heart failure his BNP was elevated and she was scheduled for an echocardiogram that had been rescheduled multiple times and not been done yet.  She previously been on torsemide 20 mg daily since 08/18/22 with minimal improvement to the swelling to her bilateral lower extremities.  She had also been started on doxycycline for about a week without improvement as well.  She presented to the [facility] emergency room on 09/03/2022 because of leg swelling and redness and drainage.  She stated for the past 2 to 3 weeks and she had noted new peripheral edema.  She had not had issues with this in the past.  She denied any shortness of breath or chest discomfort.  She did states she did develop a blister on her left lower extremity that busted over the last week and has had drainage.  She was referred to wound care today and did not like the way it looked and was concerned about infection so presented to the emergency department.  Initial vitals: Blood pressure 94/59, pulse 115, respirations 20, temperature 97.4  Pertinent labs: Blood glucose 1, BUN 34, serum creatinine 1.42, GFR 34, hemoglobin 11.6, BNP 1161.3  EKG: Interpreted by the ED provider showed atrial fibrillation with prolonged QTc with nonspecific  ST changes  Imaging: Chest x-ray showed cardiomegaly and increased interstitial markings  Medications administered in the emergency department: Furosemide 40 mg IVP, Rocephin 2 g IVPB, and metoprolol tartrate 5 mg IVP  Cardiology was consulted today for new onset systolic congestive heart failure with an EF of 25% with the patient who presented with atrial fibrillation.   Past Medical History:  Diagnosis Date   Arthritis    Bacterial pneumonia, unspecified    Cancer , breast    Candidiasis of mouth    Chronic airway obstruction, not elsewhere classified    son not aware of this   Encephalitis 1961   Family history of adverse reaction to anesthesia    son has nausea   Heart murmur    has had it for years; very mild AS 05/2015 echo   Hyposmolality and/or hyponatremia    Lumbago    Obstructive chronic bronchitis with exacerbation (HCC)    Other abnormal glucose    Pain in joint, shoulder region    Palpitations    Persistent disorder of initiating or maintaining sleep    Restless leg syndrome    Unspecified essential hypertension    Unspecified hypothyroidism    Unspecified viral infection, in conditions classified elsewhere and of unspecified site    Unspecified vitamin D deficiency    Urinary tract infection, site not specified     Past Surgical History:  Procedure Laterality Date   ABI  04/19/05   Negative   Back MRI  2005  mild bulging disks   EYE SURGERY Bilateral    Cataract surgery    HEMORRHOID SURGERY     MASTECTOMY, RADICAL     POSTERIOR CERVICAL LAMINECTOMY N/A 06/26/2015   Procedure: Cervical three-four POSTERIOR CERVICAL LAMINECTOMY for decompression ;  Surgeon: Loura Halt Ditty, MD;  Location: [facility];  Service: Neurosurgery;  Laterality: N/A;  C34 laminectomy   Venous doppler  04/19/05   LE normal     Home Medications:  Prior to Admission medications   Medication Sig Start Date End Date Taking? Authorizing Provider  amLODipine-valsartan (EXFORGE)  10-160 MG tablet Take 1 tablet by mouth daily. 06/17/22  Yes Bedsole, Amy E, MD  doxycycline (VIBRA-TABS) 100 MG tablet Take 1 tablet (100 mg total) by mouth 2 (two) times daily. 08/27/22  Yes Bedsole, Amy E, MD  HYDROcodone-acetaminophen (NORCO/VICODIN) 5-325 MG tablet TAKE 1 TO 2 TABLET BY MOUTH DAILY AS NEEDED FOR SEVERE PAIN 06/29/22  Yes Bedsole, Amy E, MD  levothyroxine (SYNTHROID) 100 MCG tablet Take 1 tablet (100 mcg total) by mouth daily. 08/18/22  Yes Bedsole, Amy E, MD  metoprolol succinate (TOPROL-XL) 50 MG 24 hr tablet TAKE 1 TABLET BY MOUTH ONCE DAILY WITH OR IMMEDIATELY FOLLOWING A MEAL 07/08/22  Yes Bedsole, Amy E, MD  senna (SENOKOT) 8.6 MG TABS tablet Take 1 tablet by mouth daily as needed for mild constipation.   Yes [provider]  torsemide (DEMADEX) 20 MG tablet Take 1 tablet (20 mg total) by mouth daily. 08/31/22  Yes Bedsole, Amy E, MD  traZODone (DESYREL) 50 MG tablet TAKE 1/2 TO 1 TABLET BY MOUTH AT BEDTIMEAS NEEDED FOR SLEEP 04/06/22  Yes Bedsole, Amy E, MD  Cholecalciferol (VITAMIN D3) 1.25 MG (50000 UT) TABS Take 1 tablet by mouth every 7 (seven) days. Patient not taking: Reported on 09/03/2022 06/30/22   Excell Seltzer, MD  ergocalciferol (VITAMIN D2) 1.25 MG (50000 UT) capsule Take 50,000 Units by mouth once a week. Patient not taking: Reported on 09/03/2022    [provider]    Inpatient Medications: Scheduled Meds:  aspirin EC  81 mg Oral Daily   atorvastatin  40 mg Oral Daily   ciprofloxacin  500 mg Oral Q breakfast   furosemide  40 mg Intravenous BID   heparin  3,500 Units Intravenous Once   levothyroxine  100 mcg Oral Daily   metoprolol succinate  50 mg Oral Daily   polyethylene glycol  17 g Oral BID   sodium chloride flush  3 mL Intravenous Q12H   Continuous Infusions:  heparin     PRN Meds: acetaminophen **OR** acetaminophen, guaiFENesin, hydrALAZINE, HYDROcodone-acetaminophen, ipratropium-albuterol, metoprolol tartrate, oxyCODONE, senna,  senna-docusate, traZODone  Allergies:    Allergies  Allergen Reactions   Meperidine Hcl Nausea And Vomiting    Social History:   Social History   Socioeconomic History   Marital status: Widowed    Spouse name: Not on file   Number of children: Not on file   Years of education: Not on file   Highest education level: Not on file  Occupational History   Not on file  Tobacco Use   Smoking status: Former    Packs/day: 0.50    Years: 42.00    Additional pack years: 0.00    Total pack years: 21.00    Types: Cigarettes   Smokeless tobacco: Never  Substance and Sexual Activity   Alcohol use: No   Drug use: No   Sexual activity: Not on file  Other Topics  Concern   Not on file  Social History Narrative   Married x60 years      No regular exercise-uses cane to ambulate, unsteady on feet      Diet: Moderately healthy, a lot of desserts       Living will, HCPOA: son: AMATULLAH KASPARIAN   Social Determinants of Health   Financial Resource Strain: Low Risk  (06/10/2022)   Overall Financial Resource Strain (CARDIA)    Difficulty of Paying Living Expenses: Not hard at all  Food Insecurity: No Food Insecurity (09/03/2022)   Hunger Vital Sign    Worried About Running Out of Food in the Last Year: Never true    Ran Out of Food in the Last Year: Never true  Transportation Needs: No Transportation Needs (09/03/2022)   PRAPARE - Administrator, Civil Service (Medical): No    Lack of Transportation (Non-Medical): No  Physical Activity: Inactive (06/10/2022)   Exercise Vital Sign    Days of Exercise per Week: 0 days    Minutes of Exercise per Session: 0 min  Stress: No Stress Concern Present (06/10/2022)   [employer] of Occupational Health - Occupational Stress Questionnaire    Feeling of Stress : Not at all  Social Connections: Socially Isolated (06/10/2022)   Social Connection and Isolation Panel [NHANES]    Frequency of Communication with Friends and Family: More than  three times a week    Frequency of Social Gatherings with Friends and Family: More than three times a week    Attends Religious Services: Never    Database administrator or Organizations: No    Attends Banker Meetings: Never    Marital Status: Widowed  Intimate Partner Violence: Not At Risk (09/03/2022)   Humiliation, Afraid, Rape, and Kick questionnaire    Fear of Current or Ex-Partner: No    Emotionally Abused: No    Physically Abused: No    Sexually Abused: No    Family History:    Family History  Problem Relation Age of Onset   Rheum arthritis Father    Stroke Mother    Diabetes Mother    Healthy Sister    Healthy Sister    Liver cancer Unknown        Aunt   Stomach cancer Unknown        Aunt     ROS:  Please see the history of present illness.  Review of Systems  Constitutional:  Positive for malaise/fatigue.  HENT:  Positive for hearing loss.   Respiratory:  Positive for shortness of breath.   Cardiovascular:  Positive for leg swelling.  Neurological:  Positive for dizziness and weakness.    All other ROS reviewed and negative.     Physical Exam/Data:   Vitals:   09/07/22 0000 09/07/22 0403 09/07/22 0919 09/07/22 1511  BP: (!) 140/90 (!) 90/44 (!) 100/47 96/73  Pulse:  89 100 97  Resp:  20 20 18   Temp:  97.8 F (36.6 C) 98 F (36.7 C) (!) 97.5 F (36.4 C)  TempSrc:    Oral  SpO2:  92% 95% 95%  Weight:      Height:        Intake/Output Summary (Last 24 hours) at 09/07/2022 1620 Last data filed at 09/07/2022 1300 Gross per 24 hour  Intake 240 ml  Output --  Net 240 ml      09/06/2022    7:02 AM 09/04/2022    3:26 AM 09/03/2022  10:38 PM  Last 3 Weights  Weight (lbs) 184 lb 4.9 oz 171 lb 1.2 oz 172 lb 14.4 oz  Weight (kg) 83.6 kg 77.6 kg 78.427 kg     Body mass index is 31.64 kg/m.  General:  In no acute distress HEENT: normal Neck: + JVD Vascular: No carotid bruits; Distal pulses 2+ bilaterally Cardiac:  normal S1, S2; IR IR;  I/VI systolic murmur  Lungs:  clear upper lobes with diminished bases to auscultation bilaterally, no wheezing, rhonchi or rales  Abd: soft, nontender, no hepatomegaly  Ext: 2+ pitting edema to the BLE Musculoskeletal:  No deformities, BUE and BLE strength normal and equal Skin: warm and dry, multiple wounds to the BLE, gauze dressing intact not removed to assess sites at this time Neuro:  CNs 2-12 intact, no focal abnormalities noted Psych:  Normal affect   EKG:  The EKG was personally reviewed and demonstrates: Rate controlled atrial fibrillation with a rate of 103, left fascicular block, incomplete left bundle branch block, nonspecific ST and T wave changes Telemetry:  Telemetry was personally reviewed and demonstrates: Rate controlled atrial fibrillation of 100-120  Relevant CV Studies: TTE 09/07/22 1. Left ventricular ejection fraction, by estimation, is 20 to 25%. The  left ventricle has severely decreased function. Left ventricular  endocardial border not optimally defined to evaluate regional wall motion.  There is mild left ventricular  hypertrophy. Left ventricular diastolic parameters are indeterminate.   2. Right ventricular systolic function is normal. The right ventricular  size is normal. There is moderately elevated pulmonary artery systolic  pressure. The estimated right ventricular systolic pressure is 47.2 mmHg.   3. The mitral valve is normal in structure. Mild to moderate mitral valve  regurgitation.   4. Tricuspid valve regurgitation is moderate.   5. The aortic valve has an indeterminant number of cusps. There is severe  calcifcation of the aortic valve. There is moderate thickening of the  aortic valve. Aortic valve regurgitation is trivial. Moderate aortic valve  stenosis.   6. Pulmonic valve regurgitation not well assessed.   7. The inferior vena cava is normal in size with <50% respiratory  variability, suggesting right atrial pressure of 8 mmHg.    Laboratory Data:  High Sensitivity Troponin:   Recent Labs  Lab 09/03/22 1343 09/03/22 2100  TROPONINIHS 600* 520*     Chemistry Recent Labs  Lab 09/05/22 0416 09/06/22 0421 09/07/22 0452  NA 139 138 136  K 3.8 3.7 4.3  CL 105 104 103  CO2 26 26 23   GLUCOSE 81 90 91  BUN 29* 28* 29*  CREATININE 1.14* 1.44* 1.38*  CALCIUM 8.6* 8.5* 8.5*  MG  --  1.9 2.1  GFRNONAA 44* 33* 35*  ANIONGAP 8 8 10     Recent Labs  Lab 09/03/22 1337  PROT 7.6  7.6  ALBUMIN 3.8  3.7  AST 19  18  ALT 12  13  ALKPHOS 107  105  BILITOT 0.8  0.8   Lipids  Recent Labs  Lab 09/04/22 0430  CHOL 109  TRIG 47  HDL 46  LDLCALC 54  CHOLHDL 2.4    Hematology Recent Labs  Lab 09/05/22 0416 09/06/22 0421 09/07/22 0452  WBC 5.2 5.3 6.9  RBC 3.29* 3.15* 3.23*  HGB 10.4* 10.1* 10.1*  HCT 31.6* 30.2* 30.8*  MCV 96.0 95.9 95.4  MCH 31.6 32.1 31.3  MCHC 32.9 33.4 32.8  RDW 13.7 13.8 14.2  PLT 152 156 149*   Thyroid  Recent  Labs  Lab 09/05/22 0414  TSH 1.198    BNP Recent Labs  Lab 09/03/22 1337 09/07/22 0452  BNP 1,161.3* 1,384.5*    DDimer No results for input(s): "DDIMER" in the last 168 hours.   Radiology/Studies:  ECHOCARDIOGRAM COMPLETE  Result Date: 09/07/2022    ECHOCARDIOGRAM REPORT   Patient Name:   Diana [name] Date of Exam: 09/07/2022 Medical Rec #:  [id number]        Height:       64.0 in Accession #:    [id number]       Weight:       184.3 lb Date of Birth:  1923-07-19        BSA:          1.890 m Patient Age:    89 years         BP:           90/44 mmHg Patient Gender: F                HR:           89 bpm. Exam Location:  [facility] Procedure: 2D Echo, Cardiac Doppler and Color Doppler Indications:     Dyspnea R06.00  History:         Patient has prior history of Echocardiogram examinations, most                  recent 06/12/2015. Signs/Symptoms:Murmur; Risk                  Factors:Hypertension. Palpitations.  Sonographer:     Cristela Blue Referring Phys:   [id number] Loura Halt AMIN Diagnosing Phys: Yvonne Kendall MD IMPRESSIONS  1. Left ventricular ejection fraction, by estimation, is 20 to 25%. The left ventricle has severely decreased function. Left ventricular endocardial border not optimally defined to evaluate regional wall motion. There is mild left ventricular hypertrophy. Left ventricular diastolic parameters are indeterminate.  2. Right ventricular systolic function is normal. The right ventricular size is normal. There is moderately elevated pulmonary artery systolic pressure. The estimated right ventricular systolic pressure is 47.2 mmHg.  3. The mitral valve is normal in structure. Mild to moderate mitral valve regurgitation.  4. Tricuspid valve regurgitation is moderate.  5. The aortic valve has an indeterminant number of cusps. There is severe calcifcation of the aortic valve. There is moderate thickening of the aortic valve. Aortic valve regurgitation is trivial. Moderate aortic valve stenosis.  6. Pulmonic valve regurgitation not well assessed.  7. The inferior vena cava is normal in size with <50% respiratory variability, suggesting right atrial pressure of 8 mmHg. FINDINGS  Left Ventricle: Left ventricular ejection fraction, by estimation, is 20 to 25%. The left ventricle has severely decreased function. Left ventricular endocardial border not optimally defined to evaluate regional wall motion. The left ventricular internal cavity size was normal in size. There is mild left ventricular hypertrophy. Left ventricular diastolic function could not be evaluated due to atrial fibrillation. Left ventricular diastolic parameters are indeterminate. Right Ventricle: The right ventricular size is normal. No increase in right ventricular wall thickness. Right ventricular systolic function is normal. There is moderately elevated pulmonary artery systolic pressure. The tricuspid regurgitant velocity is 3.13 m/s, and with an assumed right atrial pressure of 8  mmHg, the estimated right ventricular systolic pressure is 47.2 mmHg. Left Atrium: Left atrial size was normal in size. Right Atrium: Right atrial size was normal in size. Pericardium: There is no evidence of pericardial effusion.  Mitral Valve: The mitral valve is normal in structure. Mild to moderate mitral valve regurgitation. Tricuspid Valve: The tricuspid valve is not well visualized. Tricuspid valve regurgitation is moderate. Aortic Valve: The aortic valve has an indeterminant number of cusps. There is severe calcifcation of the aortic valve. There is moderate thickening of the aortic valve. Aortic valve regurgitation is trivial. Moderate aortic stenosis is present. Aortic valve mean gradient measures 8.7 mmHg. Aortic valve peak gradient measures 15.2 mmHg. Aortic valve area, by VTI measures 1.02 cm. Pulmonic Valve: The pulmonic valve was not well visualized. Pulmonic valve regurgitation not well assessed. Aorta: The aortic root is normal in size and structure. Pulmonary Artery: The pulmonary artery is not well seen. Venous: The inferior vena cava is normal in size with less than 50% respiratory variability, suggesting right atrial pressure of 8 mmHg. IAS/Shunts: No atrial level shunt detected by color flow Doppler.  LEFT VENTRICLE PLAX 2D LVIDd:         4.90 cm LVIDs:         4.60 cm LV PW:         1.10 cm LV IVS:        1.00 cm LVOT diam:     2.00 cm LV SV:         34 LV SV Index:   18 LVOT Area:     3.14 cm  RIGHT VENTRICLE RV Basal diam:  3.50 cm RV Mid diam:    2.70 cm RV S prime:     8.59 cm/s TAPSE (M-mode): 1.9 cm LEFT ATRIUM           Index        RIGHT ATRIUM           Index LA diam:      4.20 cm 2.22 cm/m   RA Area:     17.10 cm LA Vol (A2C): 33.9 ml 17.94 ml/m  RA Volume:   45.00 ml  23.81 ml/m LA Vol (A4C): 76.5 ml 40.49 ml/m  AORTIC VALVE AV Area (Vmax):    0.82 cm AV Area (Vmean):   0.85 cm AV Area (VTI):     1.02 cm AV Vmax:           195.00 cm/s AV Vmean:          131.000 cm/s AV VTI:             0.334 m AV Peak Grad:      15.2 mmHg AV Mean Grad:      8.7 mmHg LVOT Vmax:         51.10 cm/s LVOT Vmean:        35.500 cm/s LVOT VTI:          0.109 m LVOT/AV VTI ratio: 0.33  AORTA Ao Root diam: 3.20 cm MITRAL VALVE               TRICUSPID VALVE MV Area (PHT): 7.74 cm    TR Peak grad:   39.2 mmHg MV Decel Time: 98 msec     TR Vmax:        313.00 cm/s MV E velocity: 91.70 cm/s                            SHUNTS                            Systemic VTI:  0.11 m  Systemic Diam: 2.00 cm Yvonne Kendall MD Electronically signed by Yvonne Kendall MD Signature Date/Time: 09/07/2022/1:20:52 PM    Final    DG Chest Port 1 View  Result Date: 09/07/2022 CLINICAL DATA:  Cellulitis of lower extremity. EXAM: PORTABLE CHEST 1 VIEW COMPARISON:  09/03/2022 FINDINGS: Mild cardiac enlargement. Stable from previous exam. Blunting of bilateral costophrenic angles. Mild increase interstitial markings noted bilaterally. Bibasilar scar versus atelectasis. Visualized osseous structures appear unremarkable. IMPRESSION: 1. Mild increase interstitial markings bilaterally compatible with pulmonary edema. 2. Suspect small bilateral pleural effusions. 3. Bibasilar scar versus atelectasis. Electronically Signed   By: Signa Kell M.D.   On: 09/07/2022 08:45   US Venous Img Lower Bilateral (DVT)  Result Date: 09/03/2022 CLINICAL DATA:  Lower extremity pain and swelling EXAM: BILATERAL LOWER EXTREMITY VENOUS DOPPLER ULTRASOUND TECHNIQUE: Gray-scale sonography with compression, as well as color and duplex ultrasound, were performed to evaluate the deep venous system(s) from the level of the common femoral vein through the popliteal and proximal calf veins. COMPARISON:  05/08/2016 FINDINGS: VENOUS Normal compressibility of the common femoral, superficial femoral, and popliteal veins, as well as the visualized calf veins. Visualized portions of profunda femoral vein and great saphenous vein unremarkable.  No filling defects to suggest DVT on grayscale or color Doppler imaging. Doppler waveforms show normal direction of venous flow, normal respiratory plasticity and response to augmentation. OTHER None. Limitations: none IMPRESSION: 1. No evidence of deep venous thrombosis within either lower extremity. Electronically Signed   By: Sharlet Salina M.D.   On: 09/03/2022 21:29   DG Chest Portable 1 View  Result Date: 09/03/2022 CLINICAL DATA:  Shortness of breath EXAM: PORTABLE CHEST 1 VIEW COMPARISON:  05/21/2007 FINDINGS: Transverse diameter of heart is increased. Central pulmonary vessels are more prominent. Small bilateral pleural effusions are seen. There is no focal pulmonary consolidation. There is a deformity in few left ribs suggesting old healed fractures. IMPRESSION: Cardiomegaly. Central pulmonary vessels are more prominent. There is interval increase in interstitial markings in the parahilar regions and lower lung fields suggesting possible CHF with mild interstitial pulmonary edema. Blunting of both lateral CP angles suggests small bilateral pleural effusions. Electronically Signed   By: Ernie Avena M.D.   On: 09/03/2022 18:10     Assessment and Plan:   New onset HFrEF -Patient with occasional shortness of breath and peripheral edema over the last several months -BNP 1384.5 -TSH 1.198 -Serum potassium 4.3, recommend keeping greater than 4 less than 5; magnesium 2.1, recommend keeping 2 or greater -Echocardiogram completed and revealed an LVEF of 20-25% -She is on furosemide 40 mg IV twice daily -On Toprol XL 50 mg daily -Continue to escalate GDMT as able by blood pressure and kidney function -Patient is a poor candidate for any ischemic intervention due to age, frailty, and comorbidities  New onset atrial fibrillation -Patient denies palpitations or fast heart rate -Remains currently in atrial fibrillation that is rate controlled -Continued on Toprol-XL 50 mg daily -Started on  heparin infusion for CHA2DS2-VASc score of at least 5 for stroke prophylaxis -Will need to be transition to oral anticoagulant prior to discharge -Continue with telemetry monitoring  Elevated high-sensitivity troponins -Patient denies chest pain -High-sensitivity troponins trended 600 and 520, trending down -Likely component of new onset heart failure as well as A-fib RVR versus ACS -Continued on aspirin and statin therapy  Essential hypertension -Blood pressure 100/47 -Blood pressures have been soft -Continue on furosemide and Toprol XL -Vital signs per unit protocol  Cellulitis of  the bilateral lower extremities -Continued on antibiotics and wound care -Management per IM  Hypothyroidism -Continued on levothyroxine -Management per IM   Risk Assessment/Risk Scores:        New York Heart Association (NYHA) Functional Class NYHA Class II  CHA2DS2-VASc Score = 5   This indicates a 7.2% annual risk of stroke. The patient's score is based upon: CHF History: 1 HTN History: 1 Diabetes History: 0 Stroke History: 0 Vascular Disease History: 0 Age Score: 2 Gender Score: 1         For questions or updates, please contact [facility] Please consult [email or web address] for contact info under    Signed, Jhoan Schmieder, NP  09/07/2022 4:20 PM

## 2022-09-07 NOTE — Progress Notes (Signed)
Patient refusing dressing change this morning. Will relay information to day nurse.

## 2022-09-07 NOTE — Care Management Important Message (Signed)
Important Message  Patient Details  Name: Diana [name] MRN: [id number] Date of Birth: March 19, 1924   Medicare Important Message Given:  Yes  I reviewed the Important Message from Medicare with her son, Diana [name] and he understands her rights and glad she is getting a bed at the facility that he had requested. I thanked him for his time.     Olegario Messier A [name] 09/07/2022, 12:32 PM

## 2022-09-07 NOTE — Progress Notes (Signed)
*  PRELIMINARY RESULTS* Echocardiogram 2D Echocardiogram has been performed.  Cristela Blue 09/07/2022, 12:03 PM

## 2022-09-07 NOTE — Progress Notes (Signed)
Patient c/o "needing air." This RN administered nebulizer treatment. Patient states she does feel a bit better, O2 sat 97% RA, can hear audible wheezing

## 2022-09-07 NOTE — TOC Progression Note (Signed)
Transition of Care [facility]) - Progression Note    Patient Details  Name: Diana [name] MRN: [id number] Date of Birth: Sep 06, 1923  Transition of Care [facility]) CM/SW Contact  Garret Reddish, RN Phone Number: 09/07/2022, 1:56 PM  Clinical Narrative:  Chart reviewed.  I spoke with Sue Lush with [facility] on yesterday.  I have asked her to look at Mrs. Kober.  Patient's family requested [facility] or [facility].  [facility] had initially declined bed offer due to no  beds.  Sue Lush has asked me to resend SNF referral and she will review patient.    TOC will continue to follow patient.      Expected Discharge Plan: Skilled Nursing Facility Barriers to Discharge: Continued Medical Work up  Expected Discharge Plan and Services       Living arrangements for the past 2 months: Single Family Home                                       Social Determinants of Health (SDOH) Interventions SDOH Screenings   Food Insecurity: No Food Insecurity (09/03/2022)  Housing: Low Risk  (09/03/2022)  Transportation Needs: No Transportation Needs (09/03/2022)  Utilities: Not At Risk (09/03/2022)  Alcohol Screen: Low Risk  (06/10/2022)  Depression (PHQ2-9): Low Risk  (06/10/2022)  Financial Resource Strain: Low Risk  (06/10/2022)  Physical Activity: Inactive (06/10/2022)  Social Connections: Socially Isolated (06/10/2022)  Stress: No Stress Concern Present (06/10/2022)  Tobacco Use: Medium Risk (09/03/2022)    Readmission Risk Interventions     No data to display

## 2022-09-07 NOTE — Progress Notes (Signed)
PROGRESS NOTE    Diana [name]  BMW:[id number] DOB: [birth year] DOA: 09/03/2022 PCP: Excell Seltzer, MD   Brief Narrative:  89 year old female with history of chronic hearing loss, progressive bilateral lower extremity weakness, atrial fibrillation, essential hypertension comes to the hospital for evaluation of worsening bilateral lower extremity swelling and erythema with blistering.  Initially placed on IV vancomycin > PO Bactrim.  Eventually cultures grew Pseudomonas therefore transition to Cipro.   Assessment & Plan:  Principal Problem:   Cellulitis of lower extremity Active Problems:   Essential hypertension   Fluid overload   Troponin I above reference range   A-fib (HCC)     Assessment and Plan: * Cellulitis of lower extremity This appears to be bilateral in nature with mild purulent discharge.  Lower extremity Dopplers are negative for DVT.  Wound cultures is growing Pseudomonas.  Transition Bactrim to oral Cipro and sensitivities pending  Currently no concerns of deeper infection but if necessary will obtain CT scan  A-fib [facility]), chronic Incidental finding.  TSH normal.  Echocardiogram ordered-pending Continue Toprol-XL Discussed risks and benefits of anticoagulation.  At this time family and patient is opting against full anticoagulation  Troponin I above reference range No prior history.  Troponins are trending down.  Remains chest pain-free.  EKG is nonischemic.  Echocardiogram pending LDL 54  Fluid overload Abnormal breath sounds, bilaterally Chronically worsening bilateral lower extremity swelling.  Echocardiogram pending Lasix IV twice daily  Essential hypertension Currently on Lasix and Toprol.  Further adjust as needed.  IV as needed ordered  PT/OT-SNF.  TOC following.   DVT prophylaxis: Lovenox Code Status: DNR Family Communication: Son at bedside Status is: Inpatient Ongoing evaluation of bilateral lower extremity wound infection and fluid  overload In the meantime TOC working on placement.      Diet Orders (From admission, onward)     Start     Ordered   09/03/22 2023  Diet 2 gram sodium Room service appropriate? Yes; Fluid consistency: Thin  Diet effective now       Question Answer Comment  Room service appropriate? Yes   Fluid consistency: Thin      09/03/22 2022            Subjective:  Seen and examined at bedside.  Has exertional shortness of breath.  Overall very hard of hearing.  Examination: Constitutional: Not in acute distress Respiratory: Bilateral expiratory wheezing Cardiovascular: Normal sinus rhythm, no rubs Abdomen: Nontender nondistended good bowel sounds Musculoskeletal: No edema noted Skin: Bilateral extremity dressing Neurologic: CN 2-12 grossly intact.  And nonfocal Psychiatric: Normal judgment and insight. Alert and oriented x 3. Normal mood.  Objective: Vitals:   09/06/22 1937 09/06/22 2347 09/07/22 0000 09/07/22 0403  BP: 129/79  (!) 140/90 (!) 90/44  Pulse: 97 (!) 103  89  Resp: 20 20  20   Temp: 98.5 F (36.9 C) 98.5 F (36.9 C)  97.8 F (36.6 C)  TempSrc: Oral Oral    SpO2: 93% 93%  92%  Weight:      Height:        Intake/Output Summary (Last 24 hours) at 09/07/2022 0801 Last data filed at 09/06/2022 1000 Gross per 24 hour  Intake 120 ml  Output --  Net 120 ml   Filed Weights   09/03/22 2238 09/04/22 0326 09/06/22 0702  Weight: 78.4 kg 77.6 kg 83.6 kg    Scheduled Meds:  aspirin EC  81 mg Oral Daily   atorvastatin  40 mg Oral Daily  ciprofloxacin  500 mg Oral Q breakfast   enoxaparin (LOVENOX) injection  30 mg Subcutaneous Q24H   levothyroxine  100 mcg Oral Daily   metoprolol succinate  50 mg Oral Daily   sodium chloride flush  3 mL Intravenous Q12H   Continuous Infusions:  Nutritional status     Body mass index is 31.64 kg/m.  Data Reviewed:   CBC: Recent Labs  Lab 09/03/22 1337 09/03/22 2100 09/04/22 0430 09/05/22 0416 09/06/22 0421  09/07/22 0452  WBC 6.5 6.2 5.6 5.2 5.3 6.9  NEUTROABS 4.2  --   --  3.3 3.6 5.2  HGB 11.6* 11.5* 10.7* 10.4* 10.1* 10.1*  HCT 36.2 34.8* 33.1* 31.6* 30.2* 30.8*  MCV 96.8 97.2 96.5 96.0 95.9 95.4  PLT 180 188 166 152 156 149*   Basic Metabolic Panel: Recent Labs  Lab 09/03/22 1337 09/03/22 2100 09/04/22 0430 09/05/22 0416 09/06/22 0421 09/07/22 0452  NA 137  --  137 139 138 136  K 3.9  --  4.0 3.8 3.7 4.3  CL 102  --  104 105 104 103  CO2 23  --  24 26 26 23   GLUCOSE 109*  --  102* 81 90 91  BUN 34*  --  32* 29* 28* 29*  CREATININE 1.42* 1.33* 1.29* 1.14* 1.44* 1.38*  CALCIUM 9.1  --  8.6* 8.6* 8.5* 8.5*  MG  --   --   --   --  1.9 2.1   GFR: Estimated Creatinine Clearance: 25 mL/min (A) (by C-G formula based on SCr of 1.38 mg/dL (H)). Liver Function Tests: Recent Labs  Lab 09/03/22 1337  AST 19  18  ALT 12  13  ALKPHOS 107  105  BILITOT 0.8  0.8  PROT 7.6  7.6  ALBUMIN 3.8  3.7   No results for input(s): "LIPASE", "AMYLASE" in the last 168 hours. No results for input(s): "AMMONIA" in the last 168 hours. Coagulation Profile: Recent Labs  Lab 09/04/22 0430  INR 1.2   Cardiac Enzymes: No results for input(s): "CKTOTAL", "CKMB", "CKMBINDEX", "TROPONINI" in the last 168 hours. BNP (last 3 results) Recent Labs    06/29/22 1642  PROBNP 756.0*   HbA1C: No results for input(s): "HGBA1C" in the last 72 hours. CBG: No results for input(s): "GLUCAP" in the last 168 hours. Lipid Profile: No results for input(s): "CHOL", "HDL", "LDLCALC", "TRIG", "CHOLHDL", "LDLDIRECT" in the last 72 hours.  Thyroid Function Tests: Recent Labs    09/05/22 0414  TSH 1.198   Anemia Panel: No results for input(s): "VITAMINB12", "FOLATE", "FERRITIN", "TIBC", "IRON", "RETICCTPCT" in the last 72 hours. Sepsis Labs: Recent Labs  Lab 09/03/22 1337  LATICACIDVEN 1.1    Recent Results (from the past 240 hour(s))  Culture, blood (Routine X 2) w Reflex to ID Panel      Status: None (Preliminary result)   Collection Time: 09/03/22  1:37 PM   Specimen: Left Antecubital; Blood  Result Value Ref Range Status   Specimen Description LEFT ANTECUBITAL  Final   Special Requests   Final    BOTTLES DRAWN AEROBIC AND ANAEROBIC Blood Culture results may not be optimal due to an inadequate volume of blood received in culture bottles   Culture   Final    NO GROWTH 4 DAYS Performed at [facility], [address] [place], [place] [address]    Report Status PENDING  Incomplete  Culture, blood (Routine X 2) w Reflex to ID Panel     Status: None (  Preliminary result)   Collection Time: 09/03/22  8:26 PM   Specimen: BLOOD  Result Value Ref Range Status   Specimen Description BLOOD BLOOD LEFT ARM  Final   Special Requests   Final    BOTTLES DRAWN AEROBIC AND ANAEROBIC Blood Culture adequate volume   Culture   Final    NO GROWTH 4 DAYS Performed at [facility], [address], [place], [place] [address]    Report Status PENDING  Incomplete  Aerobic Culture w Gram Stain (superficial specimen)     Status: None (Preliminary result)   Collection Time: 09/05/22  6:00 AM   Specimen: Wound  Result Value Ref Range Status   Specimen Description   Final    WOUND LEFT LEG Performed at [facility], [address] [place], [place] [address]    Special Requests   Final    NONE Performed at [facility], [address] [place], [place] [address]    Gram Stain   Final    MODERATE STAPHYLOCOCCUS EPIDERMIDIS NO WBC SEEN FEW GRAM NEGATIVE RODS    Culture   Final    FEW PSEUDOMONAS AERUGINOSA SUSCEPTIBILITIES TO FOLLOW Performed at [facility], [address], [place], [place] [address]    Report Status PENDING  Incomplete  Aerobic Culture w Gram Stain (superficial specimen)     Status: None (Preliminary result)   Collection Time: 09/05/22  6:00 AM   Specimen: Wound  Result Value Ref Range Status   Specimen Description    Final    WOUND RIGHT LEG Performed at [facility], [address], [place], [place] [address]    Special Requests   Final    NONE Performed at [facility], [address] [place], [place] [address]    Gram Stain   Final    RARE SQUAMOUS EPITHELIAL CELLS PRESENT NO WBC SEEN FEW GRAM NEGATIVE RODS Performed at [facility], [address] [place], [place] [address]    Culture MODERATE PSEUDOMONAS AERUGINOSA  Final   Report Status PENDING  Incomplete         Radiology Studies: No results found.         LOS: 4 days   Time spent= 35 mins    Codi Folkerts Joline Maxcy, MD [facility]  If 7PM-7AM, please contact night-coverage  09/07/2022, 8:01 AM

## 2022-09-07 NOTE — TOC Progression Note (Signed)
Transition of Care [facility]) - Progression Note    Patient Details  Name: Diana [name] MRN: [id number] Date of Birth: [birth year]  Transition of Care [facility]) CM/SW Contact  Marlowe Sax, RN Phone Number: 09/07/2022, 1:57 PM  Clinical Narrative:    Reached out to Sue Lush at [facility] and inquired if they have a bed that they can offer for STR, she stated that they are not able to offer a bed for the patient that they are full. Resent to the facilities pending and considering  Expected Discharge Plan: Skilled Nursing Facility Barriers to Discharge: Continued Medical Work up  Expected Discharge Plan and Services       Living arrangements for the past 2 months: Single Family Home                                       Social Determinants of Health (SDOH) Interventions SDOH Screenings   Food Insecurity: No Food Insecurity (09/03/2022)  Housing: Low Risk  (09/03/2022)  Transportation Needs: No Transportation Needs (09/03/2022)  Utilities: Not At Risk (09/03/2022)  Alcohol Screen: Low Risk  (06/10/2022)  Depression (PHQ2-9): Low Risk  (06/10/2022)  Financial Resource Strain: Low Risk  (06/10/2022)  Physical Activity: Inactive (06/10/2022)  Social Connections: Socially Isolated (06/10/2022)  Stress: No Stress Concern Present (06/10/2022)  Tobacco Use: Medium Risk (09/03/2022)    Readmission Risk Interventions     No data to display

## 2022-09-07 NOTE — Consult Note (Signed)
ANTICOAGULATION CONSULT NOTE  Pharmacy Consult for heparin infusion Indication: atrial fibrillation  Allergies  Allergen Reactions   Meperidine Hcl Nausea And Vomiting    Patient Measurements: Height: 5\' 4"  (162.6 cm) Weight: 83.6 kg (184 lb 4.9 oz) IBW/kg (Calculated) : 54.7 Heparin Dosing Weight: 71.4 kg  Vital Signs: Temp: 98 F (36.7 C) (05/21 0919) BP: 100/47 (05/21 0919) Pulse Rate: 100 (05/21 0919)  Labs: Recent Labs    09/05/22 0416 09/06/22 0421 09/07/22 0452  HGB 10.4* 10.1* 10.1*  HCT 31.6* 30.2* 30.8*  PLT 152 156 149*  CREATININE 1.14* 1.44* 1.38*    Estimated Creatinine Clearance: 25 mL/min (A) (by C-G formula based on SCr of 1.38 mg/dL (H)).   Medical History: Past Medical History:  Diagnosis Date   Arthritis    Bacterial pneumonia, unspecified    Cancer , breast    Candidiasis of mouth    Chronic airway obstruction, not elsewhere classified    son not aware of this   Encephalitis 1961   Family history of adverse reaction to anesthesia    son has nausea   Heart murmur    has had it for years; very mild AS 05/2015 echo   Hyposmolality and/or hyponatremia    Lumbago    Obstructive chronic bronchitis with exacerbation (HCC)    Other abnormal glucose    Pain in joint, shoulder region    Palpitations    Persistent disorder of initiating or maintaining sleep    Restless leg syndrome    Unspecified essential hypertension    Unspecified hypothyroidism    Unspecified viral infection, in conditions classified elsewhere and of unspecified site    Unspecified vitamin D deficiency    Urinary tract infection, site not specified     Medications:  No home anticoagulation per pharmacist review.  While inpatient patient has been getting enoxaparin 30 mg SubQ daily, with last dose 5/21 @ 1004  Assessment: 89 yo female admitted for treatment of lower extremity cellulitis.  During admission patient found to be in Afib.  Pharmacy consulted to initiate  heparin infusion.  Goal of Therapy:  Heparin level 0.3-0.7 units/ml Monitor platelets by anticoagulation protocol: Yes   Plan:  Give 3500 units bolus x 1 Start heparin infusion at 1000 units/hr Check anti-Xa level in 8 hours and daily while on heparin Continue to monitor H&H and platelets  Barrie Folk, PharmD 09/07/2022,3:00 PM

## 2022-09-08 ENCOUNTER — Ambulatory Visit: Payer: Medicare Other | Admitting: Internal Medicine

## 2022-09-08 DIAGNOSIS — I5021 Acute systolic (congestive) heart failure: Secondary | ICD-10-CM

## 2022-09-08 DIAGNOSIS — I5020 Unspecified systolic (congestive) heart failure: Secondary | ICD-10-CM | POA: Diagnosis not present

## 2022-09-08 DIAGNOSIS — L03119 Cellulitis of unspecified part of limb: Secondary | ICD-10-CM | POA: Diagnosis not present

## 2022-09-08 DIAGNOSIS — I4891 Unspecified atrial fibrillation: Secondary | ICD-10-CM | POA: Diagnosis not present

## 2022-09-08 LAB — CBC
HCT: 32.5 % — ABNORMAL LOW (ref 36.0–46.0)
Hemoglobin: 10.8 g/dL — ABNORMAL LOW (ref 12.0–15.0)
MCH: 31.8 pg (ref 26.0–34.0)
MCHC: 33.2 g/dL (ref 30.0–36.0)
MCV: 95.6 fL (ref 80.0–100.0)
Platelets: 145 10*3/uL — ABNORMAL LOW (ref 150–400)
RBC: 3.4 MIL/uL — ABNORMAL LOW (ref 3.87–5.11)
RDW: 14.2 % (ref 11.5–15.5)
WBC: 8.7 10*3/uL (ref 4.0–10.5)
nRBC: 0 % (ref 0.0–0.2)

## 2022-09-08 LAB — BASIC METABOLIC PANEL
Anion gap: 9 (ref 5–15)
BUN: 30 mg/dL — ABNORMAL HIGH (ref 8–23)
CO2: 25 mmol/L (ref 22–32)
Calcium: 9 mg/dL (ref 8.9–10.3)
Chloride: 102 mmol/L (ref 98–111)
Creatinine, Ser: 1.33 mg/dL — ABNORMAL HIGH (ref 0.44–1.00)
GFR, Estimated: 37 mL/min — ABNORMAL LOW (ref >60)
Glucose, Bld: 102 mg/dL — ABNORMAL HIGH (ref 70–99)
Potassium: 3.9 mmol/L (ref 3.5–5.1)
Sodium: 136 mmol/L (ref 135–145)

## 2022-09-08 LAB — CULTURE, BLOOD (ROUTINE X 2)

## 2022-09-08 LAB — MAGNESIUM: Magnesium: 2 mg/dL (ref 1.7–2.4)

## 2022-09-08 LAB — HEPARIN LEVEL (UNFRACTIONATED): Heparin Unfractionated: 0.69 IU/mL (ref 0.30–0.70)

## 2022-09-08 MED ORDER — METOPROLOL TARTRATE 25 MG PO TABS
25.0000 mg | ORAL_TABLET | Freq: Four times a day (QID) | ORAL | Status: DC
  Administered 2022-09-08 – 2022-09-10 (×6): 25 mg via ORAL
  Filled 2022-09-08 (×7): qty 1

## 2022-09-08 MED ORDER — POTASSIUM CHLORIDE CRYS ER 20 MEQ PO TBCR
40.0000 meq | EXTENDED_RELEASE_TABLET | Freq: Once | ORAL | Status: AC
  Administered 2022-09-08: 40 meq via ORAL
  Filled 2022-09-08: qty 2

## 2022-09-08 MED ORDER — DIGOXIN 0.25 MG/ML IJ SOLN
0.2500 mg | Freq: Once | INTRAMUSCULAR | Status: AC
  Administered 2022-09-08: 0.25 mg via INTRAVENOUS
  Filled 2022-09-08: qty 2

## 2022-09-08 NOTE — Progress Notes (Signed)
Rounding Note    Patient Name: Diana [name] Date of Encounter: 09/08/2022  [facility] Cardiologist: New - Corabelle Spackman  Subjective   Patient feels a little better today with less shortness of breath when getting up to go to the bathroom.  No chest pain, palpitations, or lightheadedness.  Inpatient Medications    Scheduled Meds:  aspirin EC  81 mg Oral Daily   atorvastatin  40 mg Oral Daily   ciprofloxacin  500 mg Oral Q breakfast   furosemide  40 mg Intravenous BID   levothyroxine  100 mcg Oral Daily   metoprolol tartrate  25 mg Oral Q6H   polyethylene glycol  17 g Oral BID   sodium chloride flush  3 mL Intravenous Q12H   Continuous Infusions:  heparin 1,000 Units/hr (09/08/22 1547)   PRN Meds: acetaminophen **OR** acetaminophen, guaiFENesin, hydrALAZINE, HYDROcodone-acetaminophen, ipratropium-albuterol, metoprolol tartrate, oxyCODONE, senna, senna-docusate, traZODone   Vital Signs    Vitals:   09/07/22 2130 09/08/22 0631 09/08/22 0816 09/08/22 1541  BP: 98/67  100/73 127/77  Pulse: 96  99 81  Resp: 18  18 (!) 21  Temp: 97.8 F (36.6 C)  97.8 F (36.6 C) 97.6 F (36.4 C)  TempSrc: Oral     SpO2: 90%  (!) 87% 98%  Weight:  86.3 kg    Height:        Intake/Output Summary (Last 24 hours) at 09/08/2022 1720 Last data filed at 09/08/2022 1300 Gross per 24 hour  Intake 64.42 ml  Output --  Net 64.42 ml      09/08/2022    6:31 AM 09/06/2022    7:02 AM 09/04/2022    3:26 AM  Last 3 Weights  Weight (lbs) 190 lb 4.1 oz 184 lb 4.9 oz 171 lb 1.2 oz  Weight (kg) 86.3 kg 83.6 kg 77.6 kg      Telemetry    She will fibrillation with ventricular rates 100-140 bpm - Personally Reviewed  ECG    No new tracing.  Physical Exam   GEN: No acute distress.   Neck: No JVD Cardiac: Distant heart sounds, irregularly irregular with 1/6 systolic murmur. Respiratory: Mildly diminished breath sounds at the lung bases. GI: Soft, nontender, non-distended  MS: 1+  pretibial edema bilaterally; No deformity. Neuro:  Nonfocal  Psych: Normal affect   Labs    High Sensitivity Troponin:   Recent Labs  Lab 09/03/22 1343 09/03/22 2100  TROPONINIHS 600* 520*     Chemistry Recent Labs  Lab 09/03/22 1337 09/03/22 2100 09/06/22 0421 09/07/22 0452 09/08/22 0922  NA 137   < > 138 136 136  K 3.9   < > 3.7 4.3 3.9  CL 102   < > 104 103 102  CO2 23   < > 26 23 25   GLUCOSE 109*   < > 90 91 102*  BUN 34*   < > 28* 29* 30*  CREATININE 1.42*   < > 1.44* 1.38* 1.33*  CALCIUM 9.1   < > 8.5* 8.5* 9.0  MG  --   --  1.9 2.1 2.0  PROT 7.6  7.6  --   --   --   --   ALBUMIN 3.8  3.7  --   --   --   --   AST 19  18  --   --   --   --   ALT 12  13  --   --   --   --  ALKPHOS 107  105  --   --   --   --   BILITOT 0.8  0.8  --   --   --   --   GFRNONAA 34*   < > 33* 35* 37*  ANIONGAP 12   < > 8 10 9    < > = values in this interval not displayed.    Lipids  Recent Labs  Lab 09/04/22 0430  CHOL 109  TRIG 47  HDL 46  LDLCALC 54  CHOLHDL 2.4    Hematology Recent Labs  Lab 09/06/22 0421 09/07/22 0452 09/08/22 0922  WBC 5.3 6.9 8.7  RBC 3.15* 3.23* 3.40*  HGB 10.1* 10.1* 10.8*  HCT 30.2* 30.8* 32.5*  MCV 95.9 95.4 95.6  MCH 32.1 31.3 31.8  MCHC 33.4 32.8 33.2  RDW 13.8 14.2 14.2  PLT 156 149* 145*   Thyroid  Recent Labs  Lab 09/05/22 0414  TSH 1.198    BNP Recent Labs  Lab 09/03/22 1337 09/07/22 0452  BNP 1,161.3* 1,384.5*    DDimer No results for input(s): "DDIMER" in the last 168 hours.   Radiology    ECHOCARDIOGRAM COMPLETE  Result Date: 09/07/2022    ECHOCARDIOGRAM REPORT   Patient Name:   [name] Date of Exam: 09/07/2022 Medical Rec #:  [id number]        Height:       64.0 in Accession #:    [id number]       Weight:       184.3 lb Date of Birth:  11/02/1923        BSA:          1.890 m Patient Age:    89 years         BP:           90/44 mmHg Patient Gender: F                HR:           89 bpm. Exam Location:   [facility] Procedure: 2D Echo, Cardiac Doppler and Color Doppler Indications:     Dyspnea R06.00  History:         Patient has prior history of Echocardiogram examinations, most                  recent 06/12/2015. Signs/Symptoms:Murmur; Risk                  Factors:Hypertension. Palpitations.  Sonographer:     Cristela Blue Referring Phys:  [id number] Loura Halt AMIN Diagnosing Phys: Yvonne Kendall MD IMPRESSIONS  1. Left ventricular ejection fraction, by estimation, is 20 to 25%. The left ventricle has severely decreased function. Left ventricular endocardial border not optimally defined to evaluate regional wall motion. There is mild left ventricular hypertrophy. Left ventricular diastolic parameters are indeterminate.  2. Right ventricular systolic function is normal. The right ventricular size is normal. There is moderately elevated pulmonary artery systolic pressure. The estimated right ventricular systolic pressure is 47.2 mmHg.  3. The mitral valve is normal in structure. Mild to moderate mitral valve regurgitation.  4. Tricuspid valve regurgitation is moderate.  5. The aortic valve has an indeterminant number of cusps. There is severe calcifcation of the aortic valve. There is moderate thickening of the aortic valve. Aortic valve regurgitation is trivial. Moderate aortic valve stenosis.  6. Pulmonic valve regurgitation not well assessed.  7. The inferior vena cava is normal in size with <50% respiratory variability,  suggesting right atrial pressure of 8 mmHg. FINDINGS  Left Ventricle: Left ventricular ejection fraction, by estimation, is 20 to 25%. The left ventricle has severely decreased function. Left ventricular endocardial border not optimally defined to evaluate regional wall motion. The left ventricular internal cavity size was normal in size. There is mild left ventricular hypertrophy. Left ventricular diastolic function could not be evaluated due to atrial fibrillation. Left ventricular diastolic parameters  are indeterminate. Right Ventricle: The right ventricular size is normal. No increase in right ventricular wall thickness. Right ventricular systolic function is normal. There is moderately elevated pulmonary artery systolic pressure. The tricuspid regurgitant velocity is 3.13 m/s, and with an assumed right atrial pressure of 8 mmHg, the estimated right ventricular systolic pressure is 47.2 mmHg. Left Atrium: Left atrial size was normal in size. Right Atrium: Right atrial size was normal in size. Pericardium: There is no evidence of pericardial effusion. Mitral Valve: The mitral valve is normal in structure. Mild to moderate mitral valve regurgitation. Tricuspid Valve: The tricuspid valve is not well visualized. Tricuspid valve regurgitation is moderate. Aortic Valve: The aortic valve has an indeterminant number of cusps. There is severe calcifcation of the aortic valve. There is moderate thickening of the aortic valve. Aortic valve regurgitation is trivial. Moderate aortic stenosis is present. Aortic valve mean gradient measures 8.7 mmHg. Aortic valve peak gradient measures 15.2 mmHg. Aortic valve area, by VTI measures 1.02 cm. Pulmonic Valve: The pulmonic valve was not well visualized. Pulmonic valve regurgitation not well assessed. Aorta: The aortic root is normal in size and structure. Pulmonary Artery: The pulmonary artery is not well seen. Venous: The inferior vena cava is normal in size with less than 50% respiratory variability, suggesting right atrial pressure of 8 mmHg. IAS/Shunts: No atrial level shunt detected by color flow Doppler.  LEFT VENTRICLE PLAX 2D LVIDd:         4.90 cm LVIDs:         4.60 cm LV PW:         1.10 cm LV IVS:        1.00 cm LVOT diam:     2.00 cm LV SV:         34 LV SV Index:   18 LVOT Area:     3.14 cm  RIGHT VENTRICLE RV Basal diam:  3.50 cm RV Mid diam:    2.70 cm RV S prime:     8.59 cm/s TAPSE (M-mode): 1.9 cm LEFT ATRIUM           Index        RIGHT ATRIUM           Index  LA diam:      4.20 cm 2.22 cm/m   RA Area:     17.10 cm LA Vol (A2C): 33.9 ml 17.94 ml/m  RA Volume:   45.00 ml  23.81 ml/m LA Vol (A4C): 76.5 ml 40.49 ml/m  AORTIC VALVE AV Area (Vmax):    0.82 cm AV Area (Vmean):   0.85 cm AV Area (VTI):     1.02 cm AV Vmax:           195.00 cm/s AV Vmean:          131.000 cm/s AV VTI:            0.334 m AV Peak Grad:      15.2 mmHg AV Mean Grad:      8.7 mmHg LVOT Vmax:  51.10 cm/s LVOT Vmean:        35.500 cm/s LVOT VTI:          0.109 m LVOT/AV VTI ratio: 0.33  AORTA Ao Root diam: 3.20 cm MITRAL VALVE               TRICUSPID VALVE MV Area (PHT): 7.74 cm    TR Peak grad:   39.2 mmHg MV Decel Time: 98 msec     TR Vmax:        313.00 cm/s MV E velocity: 91.70 cm/s                            SHUNTS                            Systemic VTI:  0.11 m                            Systemic Diam: 2.00 cm Yvonne Kendall MD Electronically signed by Yvonne Kendall MD Signature Date/Time: 09/07/2022/1:20:52 PM    Final    DG Chest Port 1 View  Result Date: 09/07/2022 CLINICAL DATA:  Cellulitis of lower extremity. EXAM: PORTABLE CHEST 1 VIEW COMPARISON:  09/03/2022 FINDINGS: Mild cardiac enlargement. Stable from previous exam. Blunting of bilateral costophrenic angles. Mild increase interstitial markings noted bilaterally. Bibasilar scar versus atelectasis. Visualized osseous structures appear unremarkable. IMPRESSION: 1. Mild increase interstitial markings bilaterally compatible with pulmonary edema. 2. Suspect small bilateral pleural effusions. 3. Bibasilar scar versus atelectasis. Electronically Signed   By: Signa Kell M.D.   On: 09/07/2022 08:45    Cardiac Studies   See echocardiogram above  Patient Profile     89 y.o. female with history of hypertension, breast cancer, chronic bronchitis, and prior tobacco use, whom we have been asked to see due to newly discovered atrial fibrillation and HFrEF.  Assessment & Plan    Acute HFrEF: Ms. Knecht reports some  improvement in shortness of breath though she still appears volume overloaded.  No output volume recorded.  Weight up 6 pounds compared to 2 days ago, though I question accuracy given that patient feels better and also has less edema on exam.  Renal function stable. -Continue furosemide 40 mg IV twice daily. -Increase metoprolol to tartrate to 25 mg every 6 hours for improved rate control.  Will need to transition to evidence-based beta-blocker prior to discharge.  If she does not tolerate escalation of beta-blocker due to low cardiac output, may need to consider alternative treatment strategy for her atrial fibrillation with rapid ventricular response. -Give digoxin 250 mcg x 1 for rate control and inotropic effects. Defer addition of ACE inhibitor/ARB/aldosterone antagonist in the setting of soft blood pressure.  Atrial fibrillation with rapid ventricular response: Heart rates remain suboptimally controlled on low-dose metoprolol. -Increase metoprolol to tartrate to 25 mg every 6 hours. -Give digoxin 250 mcg x 1.  Defer standing digoxin in the setting of moderately impaired renal function and advanced age. -Continue heparin infusion. -If heart rates cannot be adequately controlled, we will need to consider TEE-guided cardioversion, though advanced age and heart failure increase the risk of this procedure.  Demand ischemia: I suspect elevated troponin is due to supply-demand mismatch.  No plans for ischemia evaluation.  Bilateral lower extremity cellulitis/wounds: I suspect this is due to progressive edema in the setting of heart failure. -Continue  diuresis. -Wound care and antibiotics per primary team.    For questions or updates, please contact [facility] Please consult [email or web address] for contact info under [facility].     Signed, Yvonne Kendall, MD  09/08/2022, 5:20 PM

## 2022-09-08 NOTE — Progress Notes (Signed)
PROGRESS NOTE    Diana [name]  EAV:[id number] DOB: [birth year] DOA: 09/03/2022 PCP: Excell Seltzer, MD   Brief Narrative:  89 year old female with history of chronic hearing loss, progressive bilateral lower extremity weakness, atrial fibrillation, essential hypertension comes to the hospital for evaluation of worsening bilateral lower extremity swelling and erythema with blistering.  Admitted for combination of CHF exacerbation and bilateral lower extremity purulent cellulitis.  Initially placed on IV vancomycin > PO Bactrim.  Eventually cultures grew Pseudomonas therefore transition to Cipro.  Echocardiogram showed new onset reduced EF 20-25%.  Cardiology team consulted who initiated patient on heparin drip and adjusting AV nodal blockers. PT/OT-SNF.  TOC consulted.  Assessment & Plan:  Principal Problem:   Cellulitis of lower extremity Active Problems:   Essential hypertension   Fluid overload   Troponin I above reference range   A-fib (HCC)     Assessment and Plan: * Cellulitis of lower extremity This appears to be bilateral in nature with mild purulent discharge.  Lower extremity Dopplers are negative for DVT.  Wound cultures is growing Pseudomonas.  This is sensitive to Cipro therefore we will continue.  Plan for total of 7 days of treatment.  Currently no concerns of deeper infection but if necessary will obtain CT scan  A-fib [facility]), chronic Incidental finding.  TSH normal.  Echocardiogram showing reduced EF 20-25%.  Cardiology team following.  Currently on Lopressor 12.5 mg every 6 hours.  Initially patient was hesitant about long-term anticoagulation.  Per cardiology recommendation heparin drip started  Acute congestive heart failure with reduced ejection fraction, class III.  EF 25% New diagnosis of depressed CHF complicated by atrial fibrillation.  Ongoing management per cardiology team. Goals would be GDMT, will defer ischemic eval to cardiology.  Lasix 40 mg IV twice  daily, adjust as necessary  Essential hypertension Currently on Lasix and Toprol.  Further adjust as needed.  IV as needed ordered  PT/OT-SNF.  TOC following.   DVT prophylaxis: Lovenox Code Status: DNR Family Communication: Son at bedside Status is: Inpatient SNF placement once she is euvolemic and cleared by cardiology      Diet Orders (From admission, onward)     Start     Ordered   09/03/22 2023  Diet 2 gram sodium Room service appropriate? Yes; Fluid consistency: Thin  Diet effective now       Question Answer Comment  Room service appropriate? Yes   Fluid consistency: Thin      09/03/22 2022            Subjective: Seen at bedside still has exertional dyspnea and hypoxia.  At rest feels little better  Examination: Constitutional: Not in acute distress Respiratory: Bibasilar crackles midway up the lung fields Cardiovascular: Normal sinus rhythm, no rubs Abdomen: Nontender nondistended good bowel sounds Musculoskeletal: 1+ bilateral lower extremity edema Skin: Bilateral lower extremity erythema improving Neurologic: CN 2-12 grossly intact.  And nonfocal Psychiatric: Normal judgment and insight. Alert and oriented x 3. Normal mood.  Objective: Vitals:   09/07/22 1511 09/07/22 2038 09/07/22 2130 09/08/22 0631  BP: 96/73  98/67   Pulse: 97 (!) 128 96   Resp: 18  18   Temp: (!) 97.5 F (36.4 C)  97.8 F (36.6 C)   TempSrc: Oral  Oral   SpO2: 95%  90%   Weight:    86.3 kg  Height:        Intake/Output Summary (Last 24 hours) at 09/08/2022 0746 Last data filed at 09/08/2022 0300  Gross per 24 hour  Intake 304.42 ml  Output --  Net 304.42 ml   Filed Weights   09/04/22 0326 09/06/22 0702 09/08/22 0631  Weight: 77.6 kg 83.6 kg 86.3 kg    Scheduled Meds:  aspirin EC  81 mg Oral Daily   atorvastatin  40 mg Oral Daily   ciprofloxacin  500 mg Oral Q breakfast   furosemide  40 mg Intravenous BID   levothyroxine  100 mcg Oral Daily   metoprolol tartrate   12.5 mg Oral Q6H   polyethylene glycol  17 g Oral BID   sodium chloride flush  3 mL Intravenous Q12H   Continuous Infusions:  heparin 1,000 Units/hr (09/07/22 1643)    Nutritional status     Body mass index is 32.66 kg/m.  Data Reviewed:   CBC: Recent Labs  Lab 09/03/22 1337 09/03/22 2100 09/04/22 0430 09/05/22 0416 09/06/22 0421 09/07/22 0452  WBC 6.5 6.2 5.6 5.2 5.3 6.9  NEUTROABS 4.2  --   --  3.3 3.6 5.2  HGB 11.6* 11.5* 10.7* 10.4* 10.1* 10.1*  HCT 36.2 34.8* 33.1* 31.6* 30.2* 30.8*  MCV 96.8 97.2 96.5 96.0 95.9 95.4  PLT 180 188 166 152 156 149*   Basic Metabolic Panel: Recent Labs  Lab 09/03/22 1337 09/03/22 2100 09/04/22 0430 09/05/22 0416 09/06/22 0421 09/07/22 0452  NA 137  --  137 139 138 136  K 3.9  --  4.0 3.8 3.7 4.3  CL 102  --  104 105 104 103  CO2 23  --  24 26 26 23   GLUCOSE 109*  --  102* 81 90 91  BUN 34*  --  32* 29* 28* 29*  CREATININE 1.42* 1.33* 1.29* 1.14* 1.44* 1.38*  CALCIUM 9.1  --  8.6* 8.6* 8.5* 8.5*  MG  --   --   --   --  1.9 2.1   GFR: Estimated Creatinine Clearance: 25.3 mL/min (A) (by C-G formula based on SCr of 1.38 mg/dL (H)). Liver Function Tests: Recent Labs  Lab 09/03/22 1337  AST 19  18  ALT 12  13  ALKPHOS 107  105  BILITOT 0.8  0.8  PROT 7.6  7.6  ALBUMIN 3.8  3.7   No results for input(s): "LIPASE", "AMYLASE" in the last 168 hours. No results for input(s): "AMMONIA" in the last 168 hours. Coagulation Profile: Recent Labs  Lab 09/04/22 0430 09/07/22 1516  INR 1.2 1.3*   Cardiac Enzymes: No results for input(s): "CKTOTAL", "CKMB", "CKMBINDEX", "TROPONINI" in the last 168 hours. BNP (last 3 results) Recent Labs    06/29/22 1642  PROBNP 756.0*   HbA1C: No results for input(s): "HGBA1C" in the last 72 hours. CBG: No results for input(s): "GLUCAP" in the last 168 hours. Lipid Profile: No results for input(s): "CHOL", "HDL", "LDLCALC", "TRIG", "CHOLHDL", "LDLDIRECT" in the last 72  hours.  Thyroid Function Tests: No results for input(s): "TSH", "T4TOTAL", "FREET4", "T3FREE", "THYROIDAB" in the last 72 hours.  Anemia Panel: No results for input(s): "VITAMINB12", "FOLATE", "FERRITIN", "TIBC", "IRON", "RETICCTPCT" in the last 72 hours. Sepsis Labs: Recent Labs  Lab 09/03/22 1337  LATICACIDVEN 1.1    Recent Results (from the past 240 hour(s))  Culture, blood (Routine X 2) w Reflex to ID Panel     Status: None   Collection Time: 09/03/22  1:37 PM   Specimen: Left Antecubital; Blood  Result Value Ref Range Status   Specimen Description LEFT ANTECUBITAL  Final   Special Requests  Final    BOTTLES DRAWN AEROBIC AND ANAEROBIC Blood Culture results may not be optimal due to an inadequate volume of blood received in culture bottles   Culture   Final    NO GROWTH 5 DAYS Performed at [facility], [address] [place], [place] [address]    Report Status 09/08/2022 FINAL  Final  Culture, blood (Routine X 2) w Reflex to ID Panel     Status: None   Collection Time: 09/03/22  8:26 PM   Specimen: BLOOD  Result Value Ref Range Status   Specimen Description BLOOD BLOOD LEFT ARM  Final   Special Requests   Final    BOTTLES DRAWN AEROBIC AND ANAEROBIC Blood Culture adequate volume   Culture   Final    NO GROWTH 5 DAYS Performed at [facility], [address], [place], [place] [address]    Report Status 09/08/2022 FINAL  Final  Aerobic Culture w Gram Stain (superficial specimen)     Status: None   Collection Time: 09/05/22  6:00 AM   Specimen: Wound  Result Value Ref Range Status   Specimen Description   Final    WOUND LEFT LEG Performed at [facility], [address], [place], [place] [address]    Special Requests   Final    NONE Performed at [facility], [address] [place], [place] [address]    Gram Stain   Final    MODERATE STAPHYLOCOCCUS EPIDERMIDIS NO WBC SEEN FEW GRAM NEGATIVE RODS Performed at [facility], [address], [place], [place] [address]    Culture MODERATE PSEUDOMONAS AERUGINOSA  Final   Report Status 09/07/2022 FINAL  Final   Organism ID, Bacteria PSEUDOMONAS AERUGINOSA  Final      Susceptibility   Pseudomonas aeruginosa - MIC*    CEFTAZIDIME >=64 RESISTANT Resistant     CIPROFLOXACIN <=0.25 SENSITIVE Sensitive     GENTAMICIN <=1 SENSITIVE Sensitive     IMIPENEM 2 SENSITIVE Sensitive     * MODERATE PSEUDOMONAS AERUGINOSA  Aerobic Culture w Gram Stain (superficial specimen)     Status: None   Collection Time: 09/05/22  6:00 AM   Specimen: Wound  Result Value Ref Range Status   Specimen Description   Final    WOUND RIGHT LEG Performed at [facility], [address] [place], [place] [address]    Special Requests   Final    NONE Performed at [facility], [address] [place], [place] [address]    Gram Stain   Final    RARE SQUAMOUS EPITHELIAL CELLS PRESENT NO WBC SEEN FEW GRAM NEGATIVE RODS    Culture   Final    MODERATE PSEUDOMONAS AERUGINOSA SUSCEPTIBILITIES PERFORMED ON PREVIOUS CULTURE WITHIN THE LAST 5 DAYS. Performed at [facility] [place] [facility], [address], [place], [place] [address]    Report Status 09/07/2022 FINAL  Final         Radiology Studies: ECHOCARDIOGRAM COMPLETE  Result Date: 09/07/2022    ECHOCARDIOGRAM REPORT   Patient Name:   Diana [name] Date of Exam: 09/07/2022 Medical Rec #:  [id number]        Height:       64.0 in Accession #:    [id number]       Weight:       184.3 lb Date of Birth:  August 05, 1923        BSA:          1.890  m Patient Age:    89 years         BP:           90/44 mmHg Patient Gender: F                HR:           89 bpm. Exam Location:  [facility] Procedure: 2D Echo, Cardiac Doppler and Color Doppler Indications:     Dyspnea R06.00  History:         Patient has prior history of Echocardiogram examinations, most                  recent 06/12/2015. Signs/Symptoms:Murmur; Risk                   Factors:Hypertension. Palpitations.  Sonographer:     Cristela Blue Referring Phys:  [id number] Loura Halt Azari Hasler Diagnosing Phys: Yvonne Kendall MD IMPRESSIONS  1. Left ventricular ejection fraction, by estimation, is 20 to 25%. The left ventricle has severely decreased function. Left ventricular endocardial border not optimally defined to evaluate regional wall motion. There is mild left ventricular hypertrophy. Left ventricular diastolic parameters are indeterminate.  2. Right ventricular systolic function is normal. The right ventricular size is normal. There is moderately elevated pulmonary artery systolic pressure. The estimated right ventricular systolic pressure is 47.2 mmHg.  3. The mitral valve is normal in structure. Mild to moderate mitral valve regurgitation.  4. Tricuspid valve regurgitation is moderate.  5. The aortic valve has an indeterminant number of cusps. There is severe calcifcation of the aortic valve. There is moderate thickening of the aortic valve. Aortic valve regurgitation is trivial. Moderate aortic valve stenosis.  6. Pulmonic valve regurgitation not well assessed.  7. The inferior vena cava is normal in size with <50% respiratory variability, suggesting right atrial pressure of 8 mmHg. FINDINGS  Left Ventricle: Left ventricular ejection fraction, by estimation, is 20 to 25%. The left ventricle has severely decreased function. Left ventricular endocardial border not optimally defined to evaluate regional wall motion. The left ventricular internal cavity size was normal in size. There is mild left ventricular hypertrophy. Left ventricular diastolic function could not be evaluated due to atrial fibrillation. Left ventricular diastolic parameters are indeterminate. Right Ventricle: The right ventricular size is normal. No increase in right ventricular wall thickness. Right ventricular systolic function is normal. There is moderately elevated pulmonary artery systolic pressure. The tricuspid  regurgitant velocity is 3.13 m/s, and with an assumed right atrial pressure of 8 mmHg, the estimated right ventricular systolic pressure is 47.2 mmHg. Left Atrium: Left atrial size was normal in size. Right Atrium: Right atrial size was normal in size. Pericardium: There is no evidence of pericardial effusion. Mitral Valve: The mitral valve is normal in structure. Mild to moderate mitral valve regurgitation. Tricuspid Valve: The tricuspid valve is not well visualized. Tricuspid valve regurgitation is moderate. Aortic Valve: The aortic valve has an indeterminant number of cusps. There is severe calcifcation of the aortic valve. There is moderate thickening of the aortic valve. Aortic valve regurgitation is trivial. Moderate aortic stenosis is present. Aortic valve mean gradient measures 8.7 mmHg. Aortic valve peak gradient measures 15.2 mmHg. Aortic valve area, by VTI measures 1.02 cm. Pulmonic Valve: The pulmonic valve was not well visualized. Pulmonic valve regurgitation not well assessed. Aorta: The aortic root is normal in size and structure. Pulmonary Artery: The pulmonary artery is not well seen. Venous: The inferior vena cava is normal in size  with less than 50% respiratory variability, suggesting right atrial pressure of 8 mmHg. IAS/Shunts: No atrial level shunt detected by color flow Doppler.  LEFT VENTRICLE PLAX 2D LVIDd:         4.90 cm LVIDs:         4.60 cm LV PW:         1.10 cm LV IVS:        1.00 cm LVOT diam:     2.00 cm LV SV:         34 LV SV Index:   18 LVOT Area:     3.14 cm  RIGHT VENTRICLE RV Basal diam:  3.50 cm RV Mid diam:    2.70 cm RV S prime:     8.59 cm/s TAPSE (M-mode): 1.9 cm LEFT ATRIUM           Index        RIGHT ATRIUM           Index LA diam:      4.20 cm 2.22 cm/m   RA Area:     17.10 cm LA Vol (A2C): 33.9 ml 17.94 ml/m  RA Volume:   45.00 ml  23.81 ml/m LA Vol (A4C): 76.5 ml 40.49 ml/m  AORTIC VALVE AV Area (Vmax):    0.82 cm AV Area (Vmean):   0.85 cm AV Area (VTI):      1.02 cm AV Vmax:           195.00 cm/s AV Vmean:          131.000 cm/s AV VTI:            0.334 m AV Peak Grad:      15.2 mmHg AV Mean Grad:      8.7 mmHg LVOT Vmax:         51.10 cm/s LVOT Vmean:        35.500 cm/s LVOT VTI:          0.109 m LVOT/AV VTI ratio: 0.33  AORTA Ao Root diam: 3.20 cm MITRAL VALVE               TRICUSPID VALVE MV Area (PHT): 7.74 cm    TR Peak grad:   39.2 mmHg MV Decel Time: 98 msec     TR Vmax:        313.00 cm/s MV E velocity: 91.70 cm/s                            SHUNTS                            Systemic VTI:  0.11 m                            Systemic Diam: 2.00 cm Yvonne Kendall MD Electronically signed by Yvonne Kendall MD Signature Date/Time: 09/07/2022/1:20:52 PM    Final    DG Chest Port 1 View  Result Date: 09/07/2022 CLINICAL DATA:  Cellulitis of lower extremity. EXAM: PORTABLE CHEST 1 VIEW COMPARISON:  09/03/2022 FINDINGS: Mild cardiac enlargement. Stable from previous exam. Blunting of bilateral costophrenic angles. Mild increase interstitial markings noted bilaterally. Bibasilar scar versus atelectasis. Visualized osseous structures appear unremarkable. IMPRESSION: 1. Mild increase interstitial markings bilaterally compatible with pulmonary edema. 2. Suspect small bilateral pleural effusions. 3. Bibasilar scar versus atelectasis. Electronically Signed   By: Ladona Ridgel  Bradly Chris M.D.   On: 09/07/2022 08:45           LOS: 5 days   Time spent= 35 mins    Preslea Rhodus Joline Maxcy, MD [facility]  If 7PM-7AM, please contact night-coverage  09/08/2022, 7:46 AM

## 2022-09-08 NOTE — Plan of Care (Signed)
Patient A&Ox4, from home, up with assist and walker in room, using BSC d/t DOE. No significant changes this shift. Patient would benefit from using her hearing aids.

## 2022-09-08 NOTE — Plan of Care (Signed)
Patient A&Ox4, from home, up with assist and walker in room. Patient used bedside commode several times during shift. Heparin continued. No significant changes this shift.

## 2022-09-08 NOTE — Progress Notes (Signed)
Physical Therapy Treatment Patient Details Name: Diana [name] MRN: [id number] DOB: 1923/05/16 Today's Date: 09/08/2022   History of Present Illness Pt is a 89 y/o F admitted on 09/03/22 after presenting with c/o progressive LE edema x 3 months. Pt is being treated for LE cellulitis, a-fib of incidental finding. PMH: HOH    PT Comments    Therapist in this am. Pt received in bed, son at bedside. Pt noted to be SOB, SpO2 at 83% on RA, pt required up to 3L O2 via [place] to attain 97% with education on PLB technique after several minutes. Pt titrated down to 2L O2 where she is able to sustain SpO2 at 95%. MD and multidisciplinary team notified, pt repositioned in bed. Will re-attempt in pm if time permits. Continue PT per POC.   Recommendations for follow up therapy are one component of a multi-disciplinary discharge planning process, led by the attending physician.  Recommendations may be updated based on patient status, additional functional criteria and insurance authorization.  Follow Up Recommendations  Can patient physically be transported by private vehicle: Yes    Assistance Recommended at Discharge Intermittent Supervision/Assistance  Patient can return home with the following A little help with walking and/or transfers;A little help with bathing/dressing/bathroom;Help with stairs or ramp for entrance   Equipment Recommendations  None recommended by PT    Recommendations for Other Services       Precautions / Restrictions Precautions Precautions: Fall Restrictions Weight Bearing Restrictions: No     Mobility  Bed Mobility               General bed mobility comments:  (deferred due to SOB and new need for O2)    Transfers                        Ambulation/Gait                   Stairs             Wheelchair Mobility    Modified Rankin (Stroke Patients Only)       Balance                                             Cognition Arousal/Alertness: Lethargic Behavior During Therapy:  (SOB at rest on RA) Overall Cognitive Status: Within Functional Limits for tasks assessed                                 General Comments: follows commands and oriented        Exercises General Exercises - Lower Extremity Ankle Circles/Pumps: AROM, Both, 10 reps Other Exercises Other Exercises:  (Education provided to pt and son regarding role of PT and PLB technique to improve SpO2. Pt on RA with sats in low 80's at rest requiring 3L to attain 93%.) Other Exercises:  (Pt repositioned in bed to promote airway clearance and reduce risk for skin breakdown)    General Comments General comments (skin integrity, edema, etc.):  (New requirement for O2, B LE's remain slightly edematous, however improved)      Pertinent Vitals/Pain Pain Assessment Pain Assessment: 0-10 Pain Score: 4  Pain Location:  (L LE) Pain Descriptors / Indicators: Discomfort Pain Intervention(s): Limited activity within patient's tolerance  Home Living                          Prior Function            PT Goals (current goals can now be found in the care plan section) Acute Rehab PT Goals Patient Stated Goal: go home    Frequency    Min 3X/week      PT Plan Current plan remains appropriate    Co-evaluation              AM-PAC PT "6 Clicks" Mobility   Outcome Measure  Help needed turning from your back to your side while in a flat bed without using bedrails?: A Little Help needed moving from lying on your back to sitting on the side of a flat bed without using bedrails?: A Little Help needed moving to and from a bed to a chair (including a wheelchair)?: A Little Help needed standing up from a chair using your arms (e.g., wheelchair or bedside chair)?: A Little Help needed to walk in hospital room?: A Little Help needed climbing 3-5 steps with a railing? : A Lot 6 Click Score: 17    End  of Session   Activity Tolerance: Patient limited by fatigue;Other (comment) (Limited by SOB) Patient left: in bed;with call bell/phone within reach;with bed alarm set Nurse Communication: Other (comment) (New O2 needs) PT Visit Diagnosis: Unsteadiness on feet (R26.81);Muscle weakness (generalized) (M62.81);Pain;Other abnormalities of gait and mobility (R26.89);Difficulty in walking, not elsewhere classified (R26.2) Pain - part of body: Leg     Time: 7829-5621 PT Time Calculation (min) (ACUTE ONLY): 17 min  Charges:  $Therapeutic Activity: 8-22 mins                    Zadie Cleverly, PTA   Jannet Askew 09/08/2022, 11:03 AM

## 2022-09-08 NOTE — Consult Note (Signed)
ANTICOAGULATION CONSULT NOTE  Pharmacy Consult for heparin infusion Indication: atrial fibrillation  Allergies  Allergen Reactions   Meperidine Hcl Nausea And Vomiting    Patient Measurements: Height: 5\' 4"  (162.6 cm) Weight: 83.6 kg (184 lb 4.9 oz) IBW/kg (Calculated) : 54.7 Heparin Dosing Weight: 71.4 kg  Vital Signs: Temp: 97.8 F (36.6 C) (05/21 2130) Temp Source: Oral (05/21 2130) BP: 98/67 (05/21 2130) Pulse Rate: 96 (05/21 2130)  Labs: Recent Labs    09/05/22 0416 09/06/22 0421 09/07/22 0452 09/07/22 1516 09/07/22 2323  HGB 10.4* 10.1* 10.1*  --   --   HCT 31.6* 30.2* 30.8*  --   --   PLT 152 156 149*  --   --   APTT  --   --   --  38*  --   LABPROT  --   --   --  16.5*  --   INR  --   --   --  1.3*  --   HEPARINUNFRC  --   --   --   --  0.70  CREATININE 1.14* 1.44* 1.38*  --   --      Estimated Creatinine Clearance: 25 mL/min (A) (by C-G formula based on SCr of 1.38 mg/dL (H)).   Medical History: Past Medical History:  Diagnosis Date   Arthritis    Bacterial pneumonia, unspecified    Cancer , breast    Candidiasis of mouth    Chronic airway obstruction, not elsewhere classified    son not aware of this   Encephalitis 1961   Family history of adverse reaction to anesthesia    son has nausea   Heart murmur    has had it for years; very mild AS 05/2015 echo   Hyposmolality and/or hyponatremia    Lumbago    Obstructive chronic bronchitis with exacerbation (HCC)    Other abnormal glucose    Pain in joint, shoulder region    Palpitations    Persistent disorder of initiating or maintaining sleep    Restless leg syndrome    Unspecified essential hypertension    Unspecified hypothyroidism    Unspecified viral infection, in conditions classified elsewhere and of unspecified site    Unspecified vitamin D deficiency    Urinary tract infection, site not specified     Medications:  No home anticoagulation per pharmacist review.  While inpatient  patient has been getting enoxaparin 30 mg SubQ daily, with last dose 5/21 @ 1004  Assessment: 89 yo female admitted for treatment of lower extremity cellulitis.  During admission patient found to be in Afib.  Pharmacy consulted to initiate heparin infusion.  Goal of Therapy:  Heparin level 0.3-0.7 units/ml Monitor platelets by anticoagulation protocol: Yes   Plan:  5/21:  HL @ 2323 = 0.70, therapeutic X 1 - will continue pt on current rate and recheck HL on 5/22 @ 0700.   Jozlynn Plaia D, PharmD 09/08/2022,12:15 AM

## 2022-09-08 NOTE — Consult Note (Signed)
ANTICOAGULATION CONSULT NOTE  Pharmacy Consult for heparin infusion Indication: atrial fibrillation  Allergies  Allergen Reactions   Meperidine Hcl Nausea And Vomiting    Patient Measurements: Height: 5\' 4"  (162.6 cm) Weight: 86.3 kg (190 lb 4.1 oz) IBW/kg (Calculated) : 54.7 Heparin Dosing Weight: 71.4 kg  Vital Signs: Temp: 97.8 F (36.6 C) (05/22 0816) Temp Source: Oral (05/21 2130) BP: 100/73 (05/22 0816) Pulse Rate: 99 (05/22 0816)  Labs: Recent Labs    09/06/22 0421 09/07/22 0452 09/07/22 1516 09/07/22 2323 09/08/22 0803  HGB 10.1* 10.1*  --   --   --   HCT 30.2* 30.8*  --   --   --   PLT 156 149*  --   --   --   APTT  --   --  38*  --   --   LABPROT  --   --  16.5*  --   --   INR  --   --  1.3*  --   --   HEPARINUNFRC  --   --   --  0.70 0.69  CREATININE 1.44* 1.38*  --   --   --      Estimated Creatinine Clearance: 25.3 mL/min (A) (by C-G formula based on SCr of 1.38 mg/dL (H)).   Medical History: Past Medical History:  Diagnosis Date   Arthritis    Bacterial pneumonia, unspecified    Cancer , breast    Candidiasis of mouth    Chronic airway obstruction, not elsewhere classified    son not aware of this   Encephalitis 1961   Family history of adverse reaction to anesthesia    son has nausea   Heart murmur    has had it for years; very mild AS 05/2015 echo   Hyposmolality and/or hyponatremia    Lumbago    Obstructive chronic bronchitis with exacerbation (HCC)    Other abnormal glucose    Pain in joint, shoulder region    Palpitations    Persistent disorder of initiating or maintaining sleep    Restless leg syndrome    Unspecified essential hypertension    Unspecified hypothyroidism    Unspecified viral infection, in conditions classified elsewhere and of unspecified site    Unspecified vitamin D deficiency    Urinary tract infection, site not specified     Medications:  No home anticoagulation per pharmacist review.  While inpatient  patient has been getting enoxaparin 30 mg SubQ daily, with last dose 5/21 @ 1004  Assessment: 89 yo female admitted for treatment of lower extremity cellulitis.  During admission patient found to be in Afib.  Pharmacy consulted to initiate heparin infusion.  Goal of Therapy:  Heparin level 0.3-0.7 units/ml Monitor platelets by anticoagulation protocol: Yes   5/21:  HL @ 2323 = 0.70, therapeutic X 1 5/22:  HL @ 0803 = 0.69, therapeutic x 2  Plan:  Heparin level therapeutic x 2 Continue heparin infusion at 1000 units/hr Daily heparin levels while therapeutic Daily CBC while on heparin  Barrie Folk, PharmD 09/08/2022,8:27 AM

## 2022-09-08 NOTE — TOC Progression Note (Signed)
Transition of Care [facility]) - Progression Note    Patient Details  Name: Diana [name] MRN: [id number] Date of Birth: 1923-08-02  Transition of Care [facility]) CM/SW Contact  Marlowe Sax, RN Phone Number: 09/08/2022, 8:56 AM  Clinical Narrative:   Spoke to son Smitty Cords and presented bed offers, They accepted the bed at [facility], I explained we would get Ins auth and that it appears the patient is medically ready to DC once we get auth, He is agreeable    Expected Discharge Plan: Skilled Nursing Facility Barriers to Discharge: SNF Pending bed offer, Insurance Authorization  Expected Discharge Plan and Services       Living arrangements for the past 2 months: Single Family Home                                       Social Determinants of Health (SDOH) Interventions SDOH Screenings   Food Insecurity: No Food Insecurity (09/03/2022)  Housing: Low Risk  (09/03/2022)  Transportation Needs: No Transportation Needs (09/03/2022)  Utilities: Not At Risk (09/03/2022)  Alcohol Screen: Low Risk  (06/10/2022)  Depression (PHQ2-9): Low Risk  (06/10/2022)  Financial Resource Strain: Low Risk  (06/10/2022)  Physical Activity: Inactive (06/10/2022)  Social Connections: Socially Isolated (06/10/2022)  Stress: No Stress Concern Present (06/10/2022)  Tobacco Use: Medium Risk (09/03/2022)    Readmission Risk Interventions     No data to display

## 2022-09-08 NOTE — Progress Notes (Signed)
Occupational Therapy Treatment Patient Details Name: Diana [name] MRN: [id number] DOB: 29-Jan-1924 Today's Date: 09/08/2022   History of present illness Pt is a 89 y/o F admitted on 09/03/22 after presenting with c/o progressive LE edema x 3 months. Pt is being treated for LE cellulitis, a-fib of incidental finding. PMH: HOH   OT comments  Upon entering the room, pt supine in bed and agreeable to OT intervention. Pt remains on 2Ls via [place] this session. Supine >sit with min A to EOB. Pt sitting with close supervision and set up A to wash face and comb hair. O2 saturation is at or above 93% during session. Min A to stand from EOB with use of RW. Pt taking side steps with min guard to get closer to [facility] . HR increases to 140's with activity during session. Pt returning to bed secondary to fatigue with min guard and call bell within reach.    Recommendations for follow up therapy are one component of a multi-disciplinary discharge planning process, led by the attending physician.  Recommendations may be updated based on patient status, additional functional criteria and insurance authorization.    Assistance Recommended at Discharge Intermittent Supervision/Assistance  Patient can return home with the following  A little help with walking and/or transfers;A little help with bathing/dressing/bathroom;Assistance with cooking/housework;Assist for transportation;Help with stairs or ramp for entrance   Equipment Recommendations  Other (comment) (defer to next venue of care)       Precautions / Restrictions Precautions Precautions: Fall       Mobility Bed Mobility Overal bed mobility: Needs Assistance Bed Mobility: Supine to Sit     Supine to sit: Min guard          Transfers Overall transfer level: Needs assistance Equipment used: Rolling walker (2 wheels) Transfers: Sit to/from Stand Sit to Stand: Min assist                 Balance Overall balance assessment: Needs  assistance Sitting-balance support: Feet supported Sitting balance-Leahy Scale: Good Sitting balance - Comments: seated on EOB   Standing balance support: During functional activity, Bilateral upper extremity supported, Reliant on assistive device for balance Standing balance-Leahy Scale: Fair                             ADL either performed or assessed with clinical judgement   ADL Overall ADL's : Needs assistance/impaired     Grooming: Sitting;Supervision/safety;Set up;Wash/dry face;Brushing hair                                      Extremity/Trunk Assessment Upper Extremity Assessment Upper Extremity Assessment: Generalized weakness   Lower Extremity Assessment Lower Extremity Assessment: Generalized weakness        Vision Patient Visual Report: No change from baseline            Cognition Arousal/Alertness: Awake/alert Behavior During Therapy: WFL for tasks assessed/performed Overall Cognitive Status: Within Functional Limits for tasks assessed                                 General Comments: follows commands and oriented                   Pertinent Vitals/ Pain       Pain Assessment Pain Assessment:  No/denies pain         Frequency  Min 2X/week        Progress Toward Goals  OT Goals(current goals can now be found in the care plan section)  Progress towards OT goals: Progressing toward goals     Plan Discharge plan remains appropriate;Frequency remains appropriate       AM-PAC OT "6 Clicks" Daily Activity     Outcome Measure   Help from another person eating meals?: None Help from another person taking care of personal grooming?: A Little Help from another person toileting, which includes using toliet, bedpan, or urinal?: A Lot Help from another person bathing (including washing, rinsing, drying)?: A Lot Help from another person to put on and taking off regular upper body clothing?: A  Little Help from another person to put on and taking off regular lower body clothing?: A Lot 6 Click Score: 16    End of Session Equipment Utilized During Treatment: Rolling walker (2 wheels)  OT Visit Diagnosis: Unsteadiness on feet (R26.81);Muscle weakness (generalized) (M62.81)   Activity Tolerance Patient tolerated treatment well;Patient limited by fatigue   Patient Left with call bell/phone within reach;in chair;with chair alarm set   Nurse Communication          Time: [phone] OT Time Calculation (min): 20 min  Charges: OT General Charges $OT Visit: 1 Visit OT Treatments $Self Care/Home Management : 8-22 mins  Jackquline Denmark, MS, OTR/L , CBIS ascom [phone]  09/08/22, 4:31 PM

## 2022-09-09 DIAGNOSIS — I420 Dilated cardiomyopathy: Secondary | ICD-10-CM

## 2022-09-09 DIAGNOSIS — I5021 Acute systolic (congestive) heart failure: Secondary | ICD-10-CM

## 2022-09-09 DIAGNOSIS — I4891 Unspecified atrial fibrillation: Secondary | ICD-10-CM | POA: Diagnosis not present

## 2022-09-09 DIAGNOSIS — I5023 Acute on chronic systolic (congestive) heart failure: Secondary | ICD-10-CM | POA: Diagnosis not present

## 2022-09-09 DIAGNOSIS — I509 Heart failure, unspecified: Secondary | ICD-10-CM

## 2022-09-09 DIAGNOSIS — L03116 Cellulitis of left lower limb: Secondary | ICD-10-CM | POA: Diagnosis not present

## 2022-09-09 LAB — BASIC METABOLIC PANEL
Anion gap: 10 (ref 5–15)
BUN: 31 mg/dL — ABNORMAL HIGH (ref 8–23)
CO2: 24 mmol/L (ref 22–32)
Calcium: 8.7 mg/dL — ABNORMAL LOW (ref 8.9–10.3)
Chloride: 101 mmol/L (ref 98–111)
Creatinine, Ser: 1.19 mg/dL — ABNORMAL HIGH (ref 0.44–1.00)
GFR, Estimated: 42 mL/min — ABNORMAL LOW (ref >60)
Glucose, Bld: 93 mg/dL (ref 70–99)
Potassium: 4.1 mmol/L (ref 3.5–5.1)
Sodium: 135 mmol/L (ref 135–145)

## 2022-09-09 LAB — MAGNESIUM: Magnesium: 1.9 mg/dL (ref 1.7–2.4)

## 2022-09-09 LAB — HEPARIN LEVEL (UNFRACTIONATED): Heparin Unfractionated: 0.45 IU/mL (ref 0.30–0.70)

## 2022-09-09 LAB — CBC
HCT: 29.8 % — ABNORMAL LOW (ref 36.0–46.0)
Hemoglobin: 10.1 g/dL — ABNORMAL LOW (ref 12.0–15.0)
MCH: 32.1 pg (ref 26.0–34.0)
MCHC: 33.9 g/dL (ref 30.0–36.0)
MCV: 94.6 fL (ref 80.0–100.0)
Platelets: 136 10*3/uL — ABNORMAL LOW (ref 150–400)
RBC: 3.15 MIL/uL — ABNORMAL LOW (ref 3.87–5.11)
RDW: 14.1 % (ref 11.5–15.5)
WBC: 8.7 10*3/uL (ref 4.0–10.5)
nRBC: 0 % (ref 0.0–0.2)

## 2022-09-09 NOTE — Progress Notes (Signed)
Rounding Note    Patient Name: Diana [name] Date of Encounter: 09/09/2022  [facility] Cardiologist: [facility]  Subjective   Very hard of hearing Reports her breathing is slowly improving Has to frequently get up to go to the restroom Remains on IV Lasix twice daily Ecchymotic bruising left forearm at site of IV Remains on heparin infusion Leg swelling slowly improving, legs are wrapped  Inpatient Medications    Scheduled Meds:  aspirin EC  81 mg Oral Daily   atorvastatin  40 mg Oral Daily   ciprofloxacin  500 mg Oral Q breakfast   furosemide  40 mg Intravenous BID   levothyroxine  100 mcg Oral Daily   metoprolol tartrate  25 mg Oral Q6H   polyethylene glycol  17 g Oral BID   sodium chloride flush  3 mL Intravenous Q12H   Continuous Infusions:  heparin 1,000 Units/hr (09/08/22 1547)   PRN Meds: acetaminophen **OR** acetaminophen, guaiFENesin, hydrALAZINE, HYDROcodone-acetaminophen, ipratropium-albuterol, metoprolol tartrate, oxyCODONE, senna, senna-docusate, traZODone   Vital Signs    Vitals:   09/08/22 0631 09/08/22 0816 09/08/22 1541 09/08/22 2307  BP:  100/73 127/77 102/73  Pulse:  99 81   Resp:  18 (!) 21 20  Temp:  97.8 F (36.6 C) 97.6 F (36.4 C) 98.3 F (36.8 C)  TempSrc:      SpO2:  (!) 87% 98% 96%  Weight: 86.3 kg     Height:        Intake/Output Summary (Last 24 hours) at 09/09/2022 1412 Last data filed at 09/09/2022 0500 Gross per 24 hour  Intake 60 ml  Output 1200 ml  Net -1140 ml      09/08/2022    6:31 AM 09/06/2022    7:02 AM 09/04/2022    3:26 AM  Last 3 Weights  Weight (lbs) 190 lb 4.1 oz 184 lb 4.9 oz 171 lb 1.2 oz  Weight (kg) 86.3 kg 83.6 kg 77.6 kg      Telemetry    Atrial fibrillation rate 90-100- Personally Reviewed  ECG     - Personally Reviewed  Physical Exam   GEN: No acute distress.  Hard of hearing Neck: JVD 8 Cardiac: Irregularly irregular no murmurs, rubs, or gallops.  Respiratory: Clear to  auscultation bilaterally. GI: Soft, nontender, non-distended  MS: Trace lower extremity bilateral edema; No deformity. Neuro:  Nonfocal  Psych: Normal affect   Labs    High Sensitivity Troponin:   Recent Labs  Lab 09/03/22 1343 09/03/22 2100  TROPONINIHS 600* 520*     Chemistry Recent Labs  Lab 09/03/22 1337 09/03/22 2100 09/07/22 0452 09/08/22 0922 09/09/22 0521  NA 137   < > 136 136 135  K 3.9   < > 4.3 3.9 4.1  CL 102   < > 103 102 101  CO2 23   < > 23 25 24   GLUCOSE 109*   < > 91 102* 93  BUN 34*   < > 29* 30* 31*  CREATININE 1.42*   < > 1.38* 1.33* 1.19*  CALCIUM 9.1   < > 8.5* 9.0 8.7*  MG  --    < > 2.1 2.0 1.9  PROT 7.6  7.6  --   --   --   --   ALBUMIN 3.8  3.7  --   --   --   --   AST 19  18  --   --   --   --   ALT 12  13  --   --   --   --   ALKPHOS 107  105  --   --   --   --   BILITOT 0.8  0.8  --   --   --   --   GFRNONAA 34*   < > 35* 37* 42*  ANIONGAP 12   < > 10 9 10    < > = values in this interval not displayed.    Lipids  Recent Labs  Lab 09/04/22 0430  CHOL 109  TRIG 47  HDL 46  LDLCALC 54  CHOLHDL 2.4    Hematology Recent Labs  Lab 09/07/22 0452 09/08/22 0922 09/09/22 0521  WBC 6.9 8.7 8.7  RBC 3.23* 3.40* 3.15*  HGB 10.1* 10.8* 10.1*  HCT 30.8* 32.5* 29.8*  MCV 95.4 95.6 94.6  MCH 31.3 31.8 32.1  MCHC 32.8 33.2 33.9  RDW 14.2 14.2 14.1  PLT 149* 145* 136*   Thyroid  Recent Labs  Lab 09/05/22 0414  TSH 1.198    BNP Recent Labs  Lab 09/03/22 1337 09/07/22 0452  BNP 1,161.3* 1,384.5*    DDimer No results for input(s): "DDIMER" in the last 168 hours.   Radiology    No results found.  Cardiac Studies   Echo  1. Left ventricular ejection fraction, by estimation, is 20 to 25%. The  left ventricle has severely decreased function. Left ventricular  endocardial border not optimally defined to evaluate regional wall motion.  There is mild left ventricular  hypertrophy. Left ventricular diastolic parameters  are indeterminate.   2. Right ventricular systolic function is normal. The right ventricular  size is normal. There is moderately elevated pulmonary artery systolic  pressure. The estimated right ventricular systolic pressure is 47.2 mmHg.   3. The mitral valve is normal in structure. Mild to moderate mitral valve  regurgitation.   4. Tricuspid valve regurgitation is moderate.   5. The aortic valve has an indeterminant number of cusps. There is severe  calcifcation of the aortic valve. There is moderate thickening of the  aortic valve. Aortic valve regurgitation is trivial. Moderate aortic valve  stenosis.   6. Pulmonic valve regurgitation not well assessed.   7. The inferior vena cava is normal in size with <50% respiratory  variability, suggesting right atrial pressure of 8 mmHg.   Patient Profile     89 y.o. female with history of hypertension, breast cancer, chronic bronchitis, and prior tobacco use, whom we have been asked to see due to newly discovered atrial fibrillation and HFrEF.   Assessment & Plan    Dilated cardiomyopathy/acute systolic CHF Etiology concerning for tachycardia mediated/atrial fibrillation Though unable to exclude underlying ischemia -Tolerating metoprolol tartrate 25 mg every 6 hours with improved rate -Responding well to IV Lasix twice daily, moderate improvement of dyspnea -Improving renal function with diuresis, possibly consistent with cardiorenal syndrome -Would recommend we continue IV Lasix twice daily Additional GDMT on hold in the setting of hypotension -Hesitant to initiate longstanding digoxin in the setting of age 4, renal dysfunction though potentially could consider very low-dose if atrial fibrillation rate continues to be an issue  Persistent atrial fibrillation Presenting with RVR, improving with metoprolol tartrate 25 every 6 hours Will hold off on adding digoxin at this time Currently on heparin infusion No immediate plans for TEE  cardioversion as clinical picture is improving though given severely reduced ejection fraction 25%, ultimate goal should be to restore normal sinus rhythm if possible  Bilateral lower  extremity cellulitis/wounds/edema Moderate improvement in leg edema with aggressive diuresis IV Lasix twice daily Legs are wrapped Would continue Lasix IV twice daily with close monitoring of renal function   Total encounter time more than 50 minutes  Greater than 50% was spent in counseling and coordination of care with the patient  For questions or updates, please contact [facility] Please consult [email or web address] for contact info under        Signed, Julien Nordmann, MD  09/09/2022, 2:12 PM

## 2022-09-09 NOTE — Consult Note (Signed)
ANTICOAGULATION CONSULT NOTE  Pharmacy Consult for heparin infusion Indication: atrial fibrillation  Allergies  Allergen Reactions   Meperidine Hcl Nausea And Vomiting    Patient Measurements: Height: 5\' 4"  (162.6 cm) Weight: 86.3 kg (190 lb 4.1 oz) IBW/kg (Calculated) : 54.7 Heparin Dosing Weight: 71.4 kg  Vital Signs: Temp: 98.3 F (36.8 C) (05/22 2307) BP: 102/73 (05/22 2307)  Labs: Recent Labs    09/07/22 0452 09/07/22 1516 09/07/22 2323 09/08/22 0803 09/08/22 0922 09/09/22 0521  HGB 10.1*  --   --   --  10.8* 10.1*  HCT 30.8*  --   --   --  32.5* 29.8*  PLT 149*  --   --   --  145* 136*  APTT  --  38*  --   --   --   --   LABPROT  --  16.5*  --   --   --   --   INR  --  1.3*  --   --   --   --   HEPARINUNFRC  --   --  0.70 0.69  --  0.45  CREATININE 1.38*  --   --   --  1.33* 1.19*     Estimated Creatinine Clearance: 29.4 mL/min (A) (by C-G formula based on SCr of 1.19 mg/dL (H)).   Medical History: Past Medical History:  Diagnosis Date   Arthritis    Bacterial pneumonia, unspecified    Cancer , breast    Candidiasis of mouth    Chronic airway obstruction, not elsewhere classified    son not aware of this   Encephalitis 1961   Family history of adverse reaction to anesthesia    son has nausea   Heart murmur    has had it for years; very mild AS 05/2015 echo   Hyposmolality and/or hyponatremia    Lumbago    Obstructive chronic bronchitis with exacerbation (HCC)    Other abnormal glucose    Pain in joint, shoulder region    Palpitations    Persistent disorder of initiating or maintaining sleep    Restless leg syndrome    Unspecified essential hypertension    Unspecified hypothyroidism    Unspecified viral infection, in conditions classified elsewhere and of unspecified site    Unspecified vitamin D deficiency    Urinary tract infection, site not specified     Medications:  No home anticoagulation per pharmacist review.  While inpatient  patient has been getting enoxaparin 30 mg SubQ daily, with last dose 5/21 @ 1004  Assessment: 89 yo female admitted for treatment of lower extremity cellulitis.  During admission patient found to be in Afib.  Pharmacy consulted to initiate heparin infusion.  Goal of Therapy:  Heparin level 0.3-0.7 units/ml Monitor platelets by anticoagulation protocol: Yes   5/21:  HL @ 2323 = 0.70, therapeutic X 1 5/22:  HL @ 0803 = 0.69, therapeutic x 2 5/23:  HL @ 0521 = 0.45, therapeutic X 3   Plan:  Heparin level therapeutic x 3 Continue heparin infusion at 1000 units/hr Daily heparin levels while therapeutic Daily CBC while on heparin  Jezabel Lecker D, PharmD 09/09/2022,5:59 AM

## 2022-09-09 NOTE — Progress Notes (Signed)
PROGRESS NOTE    Diana [name]  RUE:[id number] DOB: Jul 19, 1923 DOA: 09/03/2022 PCP: Excell Seltzer, MD   Brief Narrative:  89 year old female with history of chronic hearing loss, progressive bilateral lower extremity weakness, atrial fibrillation, essential hypertension comes to the hospital for evaluation of worsening bilateral lower extremity swelling and erythema with blistering.  Admitted for combination of CHF exacerbation and bilateral lower extremity purulent cellulitis.  Initially placed on IV vancomycin > PO Bactrim.  Eventually cultures grew Pseudomonas therefore transition to Cipro.  Echocardiogram showed new onset reduced EF 20-25%.  Cardiology team consulted who initiated patient on heparin drip and adjusting AV nodal blockers. PT/OT-SNF.  TOC consulted.  Assessment & Plan:  Principal Problem:   Cellulitis of lower extremity Active Problems:   Essential hypertension   Fluid overload   Troponin I above reference range   Atrial fibrillation with rapid ventricular response (HCC)   Acute HFrEF (heart failure with reduced ejection fraction) (HCC)     Assessment and Plan: * Cellulitis of lower extremity This appears to be bilateral in nature with mild purulent discharge.  Lower extremity Dopplers are negative for DVT.  Wound cultures is growing Pseudomonas.  Cipro plan for total of 7 days  Currently no concerns of deeper infection but if necessary will obtain CT scan  A-fib [facility]), chronic Incidental finding.  TSH normal.  Echocardiogram showing reduced EF 20-25%.  Cardiology team following.  Currently on heparin drip.  Lopressor uptitrated and being managed by cardiology team.  Digoxin was given.  Acute congestive heart failure with reduced ejection fraction, class II.  EF 25% New diagnosis of depressed CHF complicated by atrial fibrillation.  Ongoing management per cardiology team. Goals would be GDMT, holding off on ischemic eval for now Lasix IV twice daily, will defer  further adjustment to cardiology team  Essential hypertension Currently on Lasix and Toprol.  Further adjust as needed.  IV as needed ordered  PT/OT-SNF.  TOC following.   DVT prophylaxis: Lovenox Code Status: DNR Family Communication: Son at bedside Status is: Inpatient SNF when patient is euvolemic and cleared by cardiology   Diet Orders (From admission, onward)     Start     Ordered   09/03/22 2023  Diet 2 gram sodium Room service appropriate? Yes; Fluid consistency: Thin  Diet effective now       Question Answer Comment  Room service appropriate? Yes   Fluid consistency: Thin      09/03/22 2022            Subjective: Breathing has improved.  Mild exertional dyspnea.  Examination: Constitutional: Not in acute distress Respiratory: Very minimal crackles at the bases but overall significantly improved Cardiovascular: Normal sinus rhythm, no rubs Abdomen: Nontender nondistended good bowel sounds Musculoskeletal: No edema noted of her bilateral lower extremity which has significantly improved Skin: Minimal erythema bilateral lower extremity Neurologic: CN 2-12 grossly intact.  And nonfocal Psychiatric: Normal judgment and insight. Alert and oriented x 3. Normal mood.  Objective: Vitals:   09/08/22 0631 09/08/22 0816 09/08/22 1541 09/08/22 2307  BP:  100/73 127/77 102/73  Pulse:  99 81   Resp:  18 (!) 21 20  Temp:  97.8 F (36.6 C) 97.6 F (36.4 C) 98.3 F (36.8 C)  TempSrc:      SpO2:  (!) 87% 98% 96%  Weight: 86.3 kg     Height:        Intake/Output Summary (Last 24 hours) at 09/09/2022 0820 Last data filed at  09/09/2022 0500 Gross per 24 hour  Intake 60 ml  Output 1200 ml  Net -1140 ml   Filed Weights   09/04/22 0326 09/06/22 0702 09/08/22 0631  Weight: 77.6 kg 83.6 kg 86.3 kg    Scheduled Meds:  aspirin EC  81 mg Oral Daily   atorvastatin  40 mg Oral Daily   ciprofloxacin  500 mg Oral Q breakfast   furosemide  40 mg Intravenous BID    levothyroxine  100 mcg Oral Daily   metoprolol tartrate  25 mg Oral Q6H   polyethylene glycol  17 g Oral BID   sodium chloride flush  3 mL Intravenous Q12H   Continuous Infusions:  heparin 1,000 Units/hr (09/08/22 1547)    Nutritional status     Body mass index is 32.66 kg/m.  Data Reviewed:   CBC: Recent Labs  Lab 09/03/22 1337 09/03/22 2100 09/05/22 0416 09/06/22 0421 09/07/22 0452 09/08/22 0922 09/09/22 0521  WBC 6.5   < > 5.2 5.3 6.9 8.7 8.7  NEUTROABS 4.2  --  3.3 3.6 5.2  --   --   HGB 11.6*   < > 10.4* 10.1* 10.1* 10.8* 10.1*  HCT 36.2   < > 31.6* 30.2* 30.8* 32.5* 29.8*  MCV 96.8   < > 96.0 95.9 95.4 95.6 94.6  PLT 180   < > 152 156 149* 145* 136*   < > = values in this interval not displayed.   Basic Metabolic Panel: Recent Labs  Lab 09/05/22 0416 09/06/22 0421 09/07/22 0452 09/08/22 0922 09/09/22 0521  NA 139 138 136 136 135  K 3.8 3.7 4.3 3.9 4.1  CL 105 104 103 102 101  CO2 26 26 23 25 24   GLUCOSE 81 90 91 102* 93  BUN 29* 28* 29* 30* 31*  CREATININE 1.14* 1.44* 1.38* 1.33* 1.19*  CALCIUM 8.6* 8.5* 8.5* 9.0 8.7*  MG  --  1.9 2.1 2.0 1.9   GFR: Estimated Creatinine Clearance: 29.4 mL/min (A) (by C-G formula based on SCr of 1.19 mg/dL (H)). Liver Function Tests: Recent Labs  Lab 09/03/22 1337  AST 19  18  ALT 12  13  ALKPHOS 107  105  BILITOT 0.8  0.8  PROT 7.6  7.6  ALBUMIN 3.8  3.7   No results for input(s): "LIPASE", "AMYLASE" in the last 168 hours. No results for input(s): "AMMONIA" in the last 168 hours. Coagulation Profile: Recent Labs  Lab 09/04/22 0430 09/07/22 1516  INR 1.2 1.3*   Cardiac Enzymes: No results for input(s): "CKTOTAL", "CKMB", "CKMBINDEX", "TROPONINI" in the last 168 hours. BNP (last 3 results) Recent Labs    06/29/22 1642  PROBNP 756.0*   HbA1C: No results for input(s): "HGBA1C" in the last 72 hours. CBG: No results for input(s): "GLUCAP" in the last 168 hours. Lipid Profile: No results for  input(s): "CHOL", "HDL", "LDLCALC", "TRIG", "CHOLHDL", "LDLDIRECT" in the last 72 hours.  Thyroid Function Tests: No results for input(s): "TSH", "T4TOTAL", "FREET4", "T3FREE", "THYROIDAB" in the last 72 hours.  Anemia Panel: No results for input(s): "VITAMINB12", "FOLATE", "FERRITIN", "TIBC", "IRON", "RETICCTPCT" in the last 72 hours. Sepsis Labs: Recent Labs  Lab 09/03/22 1337  LATICACIDVEN 1.1    Recent Results (from the past 240 hour(s))  Culture, blood (Routine X 2) w Reflex to ID Panel     Status: None   Collection Time: 09/03/22  1:37 PM   Specimen: Left Antecubital; Blood  Result Value Ref Range Status   Specimen Description LEFT ANTECUBITAL  Final   Special Requests   Final    BOTTLES DRAWN AEROBIC AND ANAEROBIC Blood Culture results may not be optimal due to an inadequate volume of blood received in culture bottles   Culture   Final    NO GROWTH 5 DAYS Performed at [facility], [address] [place], [place] [address]    Report Status 09/08/2022 FINAL  Final  Culture, blood (Routine X 2) w Reflex to ID Panel     Status: None   Collection Time: 09/03/22  8:26 PM   Specimen: BLOOD  Result Value Ref Range Status   Specimen Description BLOOD BLOOD LEFT ARM  Final   Special Requests   Final    BOTTLES DRAWN AEROBIC AND ANAEROBIC Blood Culture adequate volume   Culture   Final    NO GROWTH 5 DAYS Performed at [facility], [address] [place], [place] [address]    Report Status 09/08/2022 FINAL  Final  Aerobic Culture w Gram Stain (superficial specimen)     Status: None   Collection Time: 09/05/22  6:00 AM   Specimen: Wound  Result Value Ref Range Status   Specimen Description   Final    WOUND LEFT LEG Performed at [facility], [address], [place], [place] [address]    Special Requests   Final    NONE Performed at [facility], [address] [place], [place] [address]    Gram Stain   Final    MODERATE  STAPHYLOCOCCUS EPIDERMIDIS NO WBC SEEN FEW GRAM NEGATIVE RODS Performed at [facility], [address], [place], [place] [address]    Culture MODERATE PSEUDOMONAS AERUGINOSA  Final   Report Status 09/07/2022 FINAL  Final   Organism ID, Bacteria PSEUDOMONAS AERUGINOSA  Final      Susceptibility   Pseudomonas aeruginosa - MIC*    CEFTAZIDIME >=64 RESISTANT Resistant     CIPROFLOXACIN <=0.25 SENSITIVE Sensitive     GENTAMICIN <=1 SENSITIVE Sensitive     IMIPENEM 2 SENSITIVE Sensitive     * MODERATE PSEUDOMONAS AERUGINOSA  Aerobic Culture w Gram Stain (superficial specimen)     Status: None   Collection Time: 09/05/22  6:00 AM   Specimen: Wound  Result Value Ref Range Status   Specimen Description   Final    WOUND RIGHT LEG Performed at [facility], [address], [place], [place] [address]    Special Requests   Final    NONE Performed at [facility], [address] [place], [place] [address]    Gram Stain   Final    RARE SQUAMOUS EPITHELIAL CELLS PRESENT NO WBC SEEN FEW GRAM NEGATIVE RODS    Culture   Final    MODERATE PSEUDOMONAS AERUGINOSA SUSCEPTIBILITIES PERFORMED ON PREVIOUS CULTURE WITHIN THE LAST 5 DAYS. Performed at [facility], [address], [place], [place] [address]    Report Status 09/07/2022 FINAL  Final         Radiology Studies: ECHOCARDIOGRAM COMPLETE  Result Date: 09/07/2022    ECHOCARDIOGRAM REPORT   Patient Name:   Diana [name] Date of Exam: 09/07/2022 Medical Rec #:  [id number]        Height:       64.0 in Accession #:    [id number]       Weight:       184.3 lb Date of Birth:  December 10, 1923        BSA:  1.890 m Patient Age:    89 years         BP:           90/44 mmHg Patient Gender: F                HR:           89 bpm. Exam Location:  [facility] Procedure: 2D Echo, Cardiac Doppler and Color Doppler Indications:     Dyspnea R06.00  History:         Patient has prior history of Echocardiogram examinations, most                   recent 06/12/2015. Signs/Symptoms:Murmur; Risk                  Factors:Hypertension. Palpitations.  Sonographer:     Cristela Blue Referring Phys:  [id number] Loura Halt Sherin Murdoch Diagnosing Phys: Yvonne Kendall MD IMPRESSIONS  1. Left ventricular ejection fraction, by estimation, is 20 to 25%. The left ventricle has severely decreased function. Left ventricular endocardial border not optimally defined to evaluate regional wall motion. There is mild left ventricular hypertrophy. Left ventricular diastolic parameters are indeterminate.  2. Right ventricular systolic function is normal. The right ventricular size is normal. There is moderately elevated pulmonary artery systolic pressure. The estimated right ventricular systolic pressure is 47.2 mmHg.  3. The mitral valve is normal in structure. Mild to moderate mitral valve regurgitation.  4. Tricuspid valve regurgitation is moderate.  5. The aortic valve has an indeterminant number of cusps. There is severe calcifcation of the aortic valve. There is moderate thickening of the aortic valve. Aortic valve regurgitation is trivial. Moderate aortic valve stenosis.  6. Pulmonic valve regurgitation not well assessed.  7. The inferior vena cava is normal in size with <50% respiratory variability, suggesting right atrial pressure of 8 mmHg. FINDINGS  Left Ventricle: Left ventricular ejection fraction, by estimation, is 20 to 25%. The left ventricle has severely decreased function. Left ventricular endocardial border not optimally defined to evaluate regional wall motion. The left ventricular internal cavity size was normal in size. There is mild left ventricular hypertrophy. Left ventricular diastolic function could not be evaluated due to atrial fibrillation. Left ventricular diastolic parameters are indeterminate. Right Ventricle: The right ventricular size is normal. No increase in right ventricular wall thickness. Right ventricular systolic function is normal.  There is moderately elevated pulmonary artery systolic pressure. The tricuspid regurgitant velocity is 3.13 m/s, and with an assumed right atrial pressure of 8 mmHg, the estimated right ventricular systolic pressure is 47.2 mmHg. Left Atrium: Left atrial size was normal in size. Right Atrium: Right atrial size was normal in size. Pericardium: There is no evidence of pericardial effusion. Mitral Valve: The mitral valve is normal in structure. Mild to moderate mitral valve regurgitation. Tricuspid Valve: The tricuspid valve is not well visualized. Tricuspid valve regurgitation is moderate. Aortic Valve: The aortic valve has an indeterminant number of cusps. There is severe calcifcation of the aortic valve. There is moderate thickening of the aortic valve. Aortic valve regurgitation is trivial. Moderate aortic stenosis is present. Aortic valve mean gradient measures 8.7 mmHg. Aortic valve peak gradient measures 15.2 mmHg. Aortic valve area, by VTI measures 1.02 cm. Pulmonic Valve: The pulmonic valve was not well visualized. Pulmonic valve regurgitation not well assessed. Aorta: The aortic root is normal in size and structure. Pulmonary Artery: The pulmonary artery is not well seen. Venous: The inferior vena cava is normal in  size with less than 50% respiratory variability, suggesting right atrial pressure of 8 mmHg. IAS/Shunts: No atrial level shunt detected by color flow Doppler.  LEFT VENTRICLE PLAX 2D LVIDd:         4.90 cm LVIDs:         4.60 cm LV PW:         1.10 cm LV IVS:        1.00 cm LVOT diam:     2.00 cm LV SV:         34 LV SV Index:   18 LVOT Area:     3.14 cm  RIGHT VENTRICLE RV Basal diam:  3.50 cm RV Mid diam:    2.70 cm RV S prime:     8.59 cm/s TAPSE (M-mode): 1.9 cm LEFT ATRIUM           Index        RIGHT ATRIUM           Index LA diam:      4.20 cm 2.22 cm/m   RA Area:     17.10 cm LA Vol (A2C): 33.9 ml 17.94 ml/m  RA Volume:   45.00 ml  23.81 ml/m LA Vol (A4C): 76.5 ml 40.49 ml/m  AORTIC  VALVE AV Area (Vmax):    0.82 cm AV Area (Vmean):   0.85 cm AV Area (VTI):     1.02 cm AV Vmax:           195.00 cm/s AV Vmean:          131.000 cm/s AV VTI:            0.334 m AV Peak Grad:      15.2 mmHg AV Mean Grad:      8.7 mmHg LVOT Vmax:         51.10 cm/s LVOT Vmean:        35.500 cm/s LVOT VTI:          0.109 m LVOT/AV VTI ratio: 0.33  AORTA Ao Root diam: 3.20 cm MITRAL VALVE               TRICUSPID VALVE MV Area (PHT): 7.74 cm    TR Peak grad:   39.2 mmHg MV Decel Time: 98 msec     TR Vmax:        313.00 cm/s MV E velocity: 91.70 cm/s                            SHUNTS                            Systemic VTI:  0.11 m                            Systemic Diam: 2.00 cm Yvonne Kendall MD Electronically signed by Yvonne Kendall MD Signature Date/Time: 09/07/2022/1:20:52 PM    Final    DG Chest Port 1 View  Result Date: 09/07/2022 CLINICAL DATA:  Cellulitis of lower extremity. EXAM: PORTABLE CHEST 1 VIEW COMPARISON:  09/03/2022 FINDINGS: Mild cardiac enlargement. Stable from previous exam. Blunting of bilateral costophrenic angles. Mild increase interstitial markings noted bilaterally. Bibasilar scar versus atelectasis. Visualized osseous structures appear unremarkable. IMPRESSION: 1. Mild increase interstitial markings bilaterally compatible with pulmonary edema. 2. Suspect small bilateral pleural effusions. 3. Bibasilar scar versus atelectasis. Electronically Signed   By:  Signa Kell M.D.   On: 09/07/2022 08:45           LOS: 6 days   Time spent= 35 mins    Sita Mangen Joline Maxcy, MD [facility]  If 7PM-7AM, please contact night-coverage  09/09/2022, 8:20 AM

## 2022-09-10 ENCOUNTER — Other Ambulatory Visit (HOSPITAL_COMMUNITY): Payer: Self-pay

## 2022-09-10 DIAGNOSIS — K8020 Calculus of gallbladder without cholecystitis without obstruction: Secondary | ICD-10-CM | POA: Diagnosis not present

## 2022-09-10 DIAGNOSIS — Z7401 Bed confinement status: Secondary | ICD-10-CM | POA: Diagnosis not present

## 2022-09-10 DIAGNOSIS — R079 Chest pain, unspecified: Secondary | ICD-10-CM | POA: Diagnosis not present

## 2022-09-10 DIAGNOSIS — R918 Other nonspecific abnormal finding of lung field: Secondary | ICD-10-CM | POA: Diagnosis not present

## 2022-09-10 DIAGNOSIS — S270XXA Traumatic pneumothorax, initial encounter: Secondary | ICD-10-CM | POA: Diagnosis not present

## 2022-09-10 DIAGNOSIS — L03115 Cellulitis of right lower limb: Secondary | ICD-10-CM | POA: Diagnosis not present

## 2022-09-10 DIAGNOSIS — M6281 Muscle weakness (generalized): Secondary | ICD-10-CM | POA: Diagnosis not present

## 2022-09-10 DIAGNOSIS — W19XXXA Unspecified fall, initial encounter: Secondary | ICD-10-CM | POA: Diagnosis not present

## 2022-09-10 DIAGNOSIS — S41112S Laceration without foreign body of left upper arm, sequela: Secondary | ICD-10-CM | POA: Diagnosis not present

## 2022-09-10 DIAGNOSIS — E785 Hyperlipidemia, unspecified: Secondary | ICD-10-CM | POA: Diagnosis not present

## 2022-09-10 DIAGNOSIS — L0390 Cellulitis, unspecified: Secondary | ICD-10-CM | POA: Diagnosis not present

## 2022-09-10 DIAGNOSIS — Y92009 Unspecified place in unspecified non-institutional (private) residence as the place of occurrence of the external cause: Secondary | ICD-10-CM | POA: Diagnosis not present

## 2022-09-10 DIAGNOSIS — L03113 Cellulitis of right upper limb: Secondary | ICD-10-CM | POA: Diagnosis not present

## 2022-09-10 DIAGNOSIS — R7989 Other specified abnormal findings of blood chemistry: Secondary | ICD-10-CM

## 2022-09-10 DIAGNOSIS — E8770 Fluid overload, unspecified: Secondary | ICD-10-CM

## 2022-09-10 DIAGNOSIS — S0993XA Unspecified injury of face, initial encounter: Secondary | ICD-10-CM | POA: Diagnosis not present

## 2022-09-10 DIAGNOSIS — S199XXA Unspecified injury of neck, initial encounter: Secondary | ICD-10-CM | POA: Diagnosis not present

## 2022-09-10 DIAGNOSIS — E871 Hypo-osmolality and hyponatremia: Secondary | ICD-10-CM | POA: Diagnosis not present

## 2022-09-10 DIAGNOSIS — S51812S Laceration without foreign body of left forearm, sequela: Secondary | ICD-10-CM | POA: Diagnosis not present

## 2022-09-10 DIAGNOSIS — S2249XA Multiple fractures of ribs, unspecified side, initial encounter for closed fracture: Secondary | ICD-10-CM | POA: Diagnosis present

## 2022-09-10 DIAGNOSIS — Z66 Do not resuscitate: Secondary | ICD-10-CM | POA: Diagnosis not present

## 2022-09-10 DIAGNOSIS — E875 Hyperkalemia: Secondary | ICD-10-CM | POA: Diagnosis not present

## 2022-09-10 DIAGNOSIS — S81812S Laceration without foreign body, left lower leg, sequela: Secondary | ICD-10-CM | POA: Diagnosis not present

## 2022-09-10 DIAGNOSIS — S2242XA Multiple fractures of ribs, left side, initial encounter for closed fracture: Secondary | ICD-10-CM | POA: Diagnosis not present

## 2022-09-10 DIAGNOSIS — I6502 Occlusion and stenosis of left vertebral artery: Secondary | ICD-10-CM | POA: Diagnosis not present

## 2022-09-10 DIAGNOSIS — I10 Essential (primary) hypertension: Secondary | ICD-10-CM | POA: Diagnosis not present

## 2022-09-10 DIAGNOSIS — S0181XA Laceration without foreign body of other part of head, initial encounter: Secondary | ICD-10-CM | POA: Diagnosis not present

## 2022-09-10 DIAGNOSIS — D539 Nutritional anemia, unspecified: Secondary | ICD-10-CM | POA: Diagnosis not present

## 2022-09-10 DIAGNOSIS — I482 Chronic atrial fibrillation, unspecified: Secondary | ICD-10-CM | POA: Diagnosis not present

## 2022-09-10 DIAGNOSIS — I4891 Unspecified atrial fibrillation: Secondary | ICD-10-CM | POA: Diagnosis not present

## 2022-09-10 DIAGNOSIS — J440 Chronic obstructive pulmonary disease with acute lower respiratory infection: Secondary | ICD-10-CM | POA: Diagnosis not present

## 2022-09-10 DIAGNOSIS — I83028 Varicose veins of left lower extremity with ulcer other part of lower leg: Secondary | ICD-10-CM | POA: Diagnosis not present

## 2022-09-10 DIAGNOSIS — I9589 Other hypotension: Secondary | ICD-10-CM | POA: Diagnosis not present

## 2022-09-10 DIAGNOSIS — W1830XA Fall on same level, unspecified, initial encounter: Secondary | ICD-10-CM | POA: Diagnosis present

## 2022-09-10 DIAGNOSIS — Y93E9 Activity, other interior property and clothing maintenance: Secondary | ICD-10-CM | POA: Diagnosis not present

## 2022-09-10 DIAGNOSIS — M150 Primary generalized (osteo)arthritis: Secondary | ICD-10-CM | POA: Diagnosis not present

## 2022-09-10 DIAGNOSIS — M25562 Pain in left knee: Secondary | ICD-10-CM | POA: Diagnosis not present

## 2022-09-10 DIAGNOSIS — I509 Heart failure, unspecified: Secondary | ICD-10-CM | POA: Diagnosis not present

## 2022-09-10 DIAGNOSIS — J449 Chronic obstructive pulmonary disease, unspecified: Secondary | ICD-10-CM | POA: Diagnosis not present

## 2022-09-10 DIAGNOSIS — S0003XA Contusion of scalp, initial encounter: Secondary | ICD-10-CM | POA: Diagnosis not present

## 2022-09-10 DIAGNOSIS — R1312 Dysphagia, oropharyngeal phase: Secondary | ICD-10-CM | POA: Diagnosis not present

## 2022-09-10 DIAGNOSIS — Z743 Need for continuous supervision: Secondary | ICD-10-CM | POA: Diagnosis not present

## 2022-09-10 DIAGNOSIS — E222 Syndrome of inappropriate secretion of antidiuretic hormone: Secondary | ICD-10-CM | POA: Diagnosis not present

## 2022-09-10 DIAGNOSIS — K5909 Other constipation: Secondary | ICD-10-CM | POA: Diagnosis not present

## 2022-09-10 DIAGNOSIS — Y92099 Unspecified place in other non-institutional residence as the place of occurrence of the external cause: Secondary | ICD-10-CM | POA: Diagnosis not present

## 2022-09-10 DIAGNOSIS — I5021 Acute systolic (congestive) heart failure: Secondary | ICD-10-CM | POA: Diagnosis not present

## 2022-09-10 DIAGNOSIS — I5032 Chronic diastolic (congestive) heart failure: Secondary | ICD-10-CM | POA: Diagnosis not present

## 2022-09-10 DIAGNOSIS — M6258 Muscle wasting and atrophy, not elsewhere classified, other site: Secondary | ICD-10-CM | POA: Diagnosis not present

## 2022-09-10 DIAGNOSIS — Z043 Encounter for examination and observation following other accident: Secondary | ICD-10-CM | POA: Diagnosis not present

## 2022-09-10 DIAGNOSIS — I5022 Chronic systolic (congestive) heart failure: Secondary | ICD-10-CM | POA: Diagnosis not present

## 2022-09-10 DIAGNOSIS — L03116 Cellulitis of left lower limb: Secondary | ICD-10-CM | POA: Diagnosis not present

## 2022-09-10 DIAGNOSIS — S01312A Laceration without foreign body of left ear, initial encounter: Secondary | ICD-10-CM | POA: Diagnosis not present

## 2022-09-10 DIAGNOSIS — I110 Hypertensive heart disease with heart failure: Secondary | ICD-10-CM | POA: Diagnosis not present

## 2022-09-10 DIAGNOSIS — G4700 Insomnia, unspecified: Secondary | ICD-10-CM | POA: Diagnosis not present

## 2022-09-10 DIAGNOSIS — R109 Unspecified abdominal pain: Secondary | ICD-10-CM | POA: Diagnosis not present

## 2022-09-10 DIAGNOSIS — S0990XA Unspecified injury of head, initial encounter: Secondary | ICD-10-CM | POA: Diagnosis not present

## 2022-09-10 DIAGNOSIS — I83018 Varicose veins of right lower extremity with ulcer other part of lower leg: Secondary | ICD-10-CM | POA: Diagnosis not present

## 2022-09-10 DIAGNOSIS — K5903 Drug induced constipation: Secondary | ICD-10-CM | POA: Diagnosis not present

## 2022-09-10 DIAGNOSIS — G2581 Restless legs syndrome: Secondary | ICD-10-CM | POA: Diagnosis not present

## 2022-09-10 DIAGNOSIS — E876 Hypokalemia: Secondary | ICD-10-CM | POA: Diagnosis not present

## 2022-09-10 DIAGNOSIS — E039 Hypothyroidism, unspecified: Secondary | ICD-10-CM | POA: Diagnosis not present

## 2022-09-10 DIAGNOSIS — I420 Dilated cardiomyopathy: Secondary | ICD-10-CM | POA: Diagnosis not present

## 2022-09-10 DIAGNOSIS — D352 Benign neoplasm of pituitary gland: Secondary | ICD-10-CM | POA: Diagnosis not present

## 2022-09-10 DIAGNOSIS — L03119 Cellulitis of unspecified part of limb: Secondary | ICD-10-CM | POA: Diagnosis not present

## 2022-09-10 DIAGNOSIS — R935 Abnormal findings on diagnostic imaging of other abdominal regions, including retroperitoneum: Secondary | ICD-10-CM | POA: Diagnosis not present

## 2022-09-10 DIAGNOSIS — R278 Other lack of coordination: Secondary | ICD-10-CM | POA: Diagnosis not present

## 2022-09-10 DIAGNOSIS — Z23 Encounter for immunization: Secondary | ICD-10-CM | POA: Diagnosis not present

## 2022-09-10 DIAGNOSIS — R0902 Hypoxemia: Secondary | ICD-10-CM | POA: Diagnosis not present

## 2022-09-10 DIAGNOSIS — S299XXA Unspecified injury of thorax, initial encounter: Secondary | ICD-10-CM | POA: Diagnosis not present

## 2022-09-10 DIAGNOSIS — I671 Cerebral aneurysm, nonruptured: Secondary | ICD-10-CM | POA: Diagnosis not present

## 2022-09-10 DIAGNOSIS — D6832 Hemorrhagic disorder due to extrinsic circulating anticoagulants: Secondary | ICD-10-CM | POA: Diagnosis not present

## 2022-09-10 DIAGNOSIS — D329 Benign neoplasm of meninges, unspecified: Secondary | ICD-10-CM | POA: Diagnosis not present

## 2022-09-10 DIAGNOSIS — M6259 Muscle wasting and atrophy, not elsewhere classified, multiple sites: Secondary | ICD-10-CM | POA: Diagnosis not present

## 2022-09-10 DIAGNOSIS — J4489 Other specified chronic obstructive pulmonary disease: Secondary | ICD-10-CM | POA: Diagnosis not present

## 2022-09-10 DIAGNOSIS — M7981 Nontraumatic hematoma of soft tissue: Secondary | ICD-10-CM | POA: Diagnosis not present

## 2022-09-10 DIAGNOSIS — S0083XA Contusion of other part of head, initial encounter: Secondary | ICD-10-CM | POA: Diagnosis not present

## 2022-09-10 DIAGNOSIS — I480 Paroxysmal atrial fibrillation: Secondary | ICD-10-CM | POA: Diagnosis not present

## 2022-09-10 LAB — BASIC METABOLIC PANEL
Anion gap: 7 (ref 5–15)
BUN: 29 mg/dL — ABNORMAL HIGH (ref 8–23)
CO2: 25 mmol/L (ref 22–32)
Calcium: 8.3 mg/dL — ABNORMAL LOW (ref 8.9–10.3)
Chloride: 100 mmol/L (ref 98–111)
Creatinine, Ser: 1.12 mg/dL — ABNORMAL HIGH (ref 0.44–1.00)
GFR, Estimated: 45 mL/min — ABNORMAL LOW (ref >60)
Glucose, Bld: 76 mg/dL (ref 70–99)
Potassium: 3.7 mmol/L (ref 3.5–5.1)
Sodium: 132 mmol/L — ABNORMAL LOW (ref 135–145)

## 2022-09-10 LAB — CBC
HCT: 26.3 % — ABNORMAL LOW (ref 36.0–46.0)
Hemoglobin: 8.6 g/dL — ABNORMAL LOW (ref 12.0–15.0)
MCH: 31.7 pg (ref 26.0–34.0)
MCHC: 32.7 g/dL (ref 30.0–36.0)
MCV: 97 fL (ref 80.0–100.0)
Platelets: 116 10*3/uL — ABNORMAL LOW (ref 150–400)
RBC: 2.71 MIL/uL — ABNORMAL LOW (ref 3.87–5.11)
RDW: 14.1 % (ref 11.5–15.5)
WBC: 7 10*3/uL (ref 4.0–10.5)
nRBC: 0 % (ref 0.0–0.2)

## 2022-09-10 LAB — THYROID PANEL WITH TSH
Free Thyroxine Index: 3.6 (ref 1.2–4.9)
T3 Uptake Ratio: 35 % (ref 24–39)
T4, Total: 10.3 ug/dL (ref 4.5–12.0)
TSH: 1.92 u[IU]/mL (ref 0.450–4.500)

## 2022-09-10 LAB — MAGNESIUM: Magnesium: 1.8 mg/dL (ref 1.7–2.4)

## 2022-09-10 LAB — HEPARIN LEVEL (UNFRACTIONATED): Heparin Unfractionated: 0.41 IU/mL (ref 0.30–0.70)

## 2022-09-10 MED ORDER — FUROSEMIDE 40 MG PO TABS: 40.0000 mg | ORAL_TABLET | Freq: Every day | ORAL | Status: DC

## 2022-09-10 MED ORDER — POTASSIUM CHLORIDE CRYS ER 10 MEQ PO TBCR: 10.0000 meq | EXTENDED_RELEASE_TABLET | Freq: Every day | ORAL | 3 refills | Status: DC

## 2022-09-10 MED ORDER — ASPIRIN 81 MG PO TBEC: 81.0000 mg | DELAYED_RELEASE_TABLET | Freq: Every day | ORAL | 12 refills | Status: DC

## 2022-09-10 MED ORDER — POTASSIUM CHLORIDE CRYS ER 10 MEQ PO TBCR: 10.0000 meq | EXTENDED_RELEASE_TABLET | Freq: Every day | ORAL | Status: DC

## 2022-09-10 MED ORDER — APIXABAN 5 MG PO TABS
5.0000 mg | ORAL_TABLET | Freq: Two times a day (BID) | ORAL | Status: DC
  Administered 2022-09-10: 5 mg via ORAL
  Filled 2022-09-10: qty 1

## 2022-09-10 MED ORDER — HYDROCODONE-ACETAMINOPHEN 5-325 MG PO TABS: 1.0000 | ORAL_TABLET | Freq: Four times a day (QID) | ORAL | 0 refills | Status: AC | PRN

## 2022-09-10 MED ORDER — ATORVASTATIN CALCIUM 40 MG PO TABS: 40.0000 mg | ORAL_TABLET | Freq: Every day | ORAL | Status: DC

## 2022-09-10 MED ORDER — METOPROLOL SUCCINATE ER 25 MG PO TB24
25.0000 mg | ORAL_TABLET | Freq: Two times a day (BID) | ORAL | Status: DC
  Administered 2022-09-10: 25 mg via ORAL
  Filled 2022-09-10: qty 1

## 2022-09-10 MED ORDER — POTASSIUM CHLORIDE CRYS ER 20 MEQ PO TBCR
20.0000 meq | EXTENDED_RELEASE_TABLET | Freq: Once | ORAL | Status: AC
  Administered 2022-09-10: 20 meq via ORAL
  Filled 2022-09-10: qty 1

## 2022-09-10 MED ORDER — APIXABAN 5 MG PO TABS: 5.0000 mg | ORAL_TABLET | Freq: Two times a day (BID) | ORAL | Status: DC

## 2022-09-10 MED ORDER — METOPROLOL SUCCINATE ER 25 MG PO TB24: 25.0000 mg | ORAL_TABLET | Freq: Two times a day (BID) | ORAL | Status: DC

## 2022-09-10 MED ORDER — CIPROFLOXACIN HCL 500 MG PO TABS: 500.0000 mg | ORAL_TABLET | Freq: Every day | ORAL | 0 refills | Status: AC

## 2022-09-10 NOTE — Care Management Important Message (Signed)
Important Message  Patient Details  Name: Diana [name] MRN: [id number] Date of Birth: 1924-03-02   Medicare Important Message Given:  Yes     Olegario Messier A Keyon Liller 09/10/2022, 12:27 PM

## 2022-09-10 NOTE — Consult Note (Signed)
ANTICOAGULATION CONSULT NOTE  Pharmacy Consult for heparin infusion Indication: atrial fibrillation  Allergies  Allergen Reactions   Meperidine Hcl Nausea And Vomiting    Patient Measurements: Height: 5\' 4"  (162.6 cm) Weight: 86.3 kg (190 lb 4.1 oz) IBW/kg (Calculated) : 54.7 Heparin Dosing Weight: 71.4 kg  Vital Signs: Temp: 97.8 F (36.6 C) (05/23 2259) BP: 107/86 (05/23 2259) Pulse Rate: 105 (05/23 2259)  Labs: Recent Labs    09/07/22 1516 09/07/22 2323 09/08/22 0803 09/08/22 0922 09/08/22 0922 09/09/22 0521 09/10/22 0720  HGB  --   --   --  10.8*   < > 10.1* 8.6*  HCT  --   --   --  32.5*  --  29.8* 26.3*  PLT  --   --   --  145*  --  136* 116*  APTT 38*  --   --   --   --   --   --   LABPROT 16.5*  --   --   --   --   --   --   INR 1.3*  --   --   --   --   --   --   HEPARINUNFRC  --    < > 0.69  --   --  0.45 0.41  CREATININE  --   --   --  1.33*  --  1.19* 1.12*   < > = values in this interval not displayed.     Estimated Creatinine Clearance: 31.2 mL/min (A) (by C-G formula based on SCr of 1.12 mg/dL (H)).   Medical History: Past Medical History:  Diagnosis Date   Arthritis    Bacterial pneumonia, unspecified    Cancer , breast    Candidiasis of mouth    Chronic airway obstruction, not elsewhere classified    son not aware of this   Encephalitis 1961   Family history of adverse reaction to anesthesia    son has nausea   Heart murmur    has had it for years; very mild AS 05/2015 echo   Hyposmolality and/or hyponatremia    Lumbago    Obstructive chronic bronchitis with exacerbation (HCC)    Other abnormal glucose    Pain in joint, shoulder region    Palpitations    Persistent disorder of initiating or maintaining sleep    Restless leg syndrome    Unspecified essential hypertension    Unspecified hypothyroidism    Unspecified viral infection, in conditions classified elsewhere and of unspecified site    Unspecified vitamin D deficiency     Urinary tract infection, site not specified     Medications:  No home anticoagulation per pharmacist review.  While inpatient patient has been getting enoxaparin 30 mg SubQ daily, with last dose 5/21 @ 1004  Assessment: 89 yo female admitted for treatment of lower extremity cellulitis.  During admission patient found to be in Afib.  Pharmacy consulted to initiate heparin infusion.  Goal of Therapy:  Heparin level 0.3-0.7 units/ml Monitor platelets by anticoagulation protocol: Yes   5/21:  HL @ 2323 = 0.70, therapeutic X 1 5/22:  HL @ 0803 = 0.69, therapeutic x 2 5/23:  HL @ 0521 = 0.45, therapeutic X 3  5/24:  HL @ 0720 = 0.41, therapeutic x 4 Plan:  Heparin level therapeutic Continue heparin infusion at 1000 units/hr Daily heparin levels while therapeutic Daily CBC while on heparin  Barrie Folk, PharmD 09/10/2022,8:28 AM

## 2022-09-10 NOTE — Progress Notes (Signed)
Patient discharging to [facility]. Report given to [facility]. Son notified.

## 2022-09-10 NOTE — TOC Progression Note (Signed)
Transition of Care [facility]) - Progression Note    Patient Details  Name: Diana [name] MRN: [id number] Date of Birth: 1923/12/01  Transition of Care [facility]) CM/SW Contact  Marlowe Sax, RN Phone Number: 09/10/2022, 10:16 AM Plan and Services Clinical Narrative:   TOC continues to follow this patient, She plans to go to [place] at [place] and Ins is approved. [id number] auth ID, Ins expires at [facility] 5/24, if she does not DC Ins auth will need to be started over, she continues to get Medical treatment    Living arrangements for the past 2 months: Single Family Home                                       Social Determinants of Health (SDOH) Interventions SDOH Screenings   Food Insecurity: No Food Insecurity (09/03/2022)  Housing: Low Risk  (09/03/2022)  Transportation Needs: No Transportation Needs (09/03/2022)  Utilities: Not At Risk (09/03/2022)  Alcohol Screen: Low Risk  (06/10/2022)  Depression (PHQ2-9): Low Risk  (06/10/2022)  Financial Resource Strain: Low Risk  (06/10/2022)  Physical Activity: Inactive (06/10/2022)  Social Connections: Socially Isolated (06/10/2022)  Stress: No Stress Concern Present (06/10/2022)  Tobacco Use: Medium Risk (09/03/2022)    Readmission Risk Interventions     No data to display

## 2022-09-10 NOTE — TOC Benefit Eligibility Note (Signed)
Patient Advocate Encounter  Insurance verification completed.    The patient is currently admitted and upon discharge could be taking Eliquis 5 mg.  The current 30 day co-pay is $47.00.   The patient is insured through [employer] Medicare Part D   This test claim was processed through [facility]- copay amounts may vary at other pharmacies due to pharmacy/plan contracts, or as the patient moves through the different stages of their insurance plan.  Narda Fundora, CPHT Pharmacy Patient Advocate Specialist [facility] Patient Advocate Team Direct Number: [phone]  Fax: [phone]       

## 2022-09-10 NOTE — Progress Notes (Signed)
Rounding Note    Patient Name: Diana [name] Date of Encounter: 09/10/2022  [facility] Cardiologist: [facility]  Subjective   Very hard of hearing Reports left hip is uncomfortable Shortness of breath improving on IV Lasix twice daily Ecchymotic bruising left forearm at site of IV Remains on heparin infusion Leg swelling slowly improving, legs are wrapped  Inpatient Medications    Scheduled Meds:  apixaban  5 mg Oral BID   aspirin EC  81 mg Oral Daily   atorvastatin  40 mg Oral Daily   ciprofloxacin  500 mg Oral Q breakfast   [START ON 09/11/2022] furosemide  40 mg Oral Daily   levothyroxine  100 mcg Oral Daily   metoprolol succinate  25 mg Oral BID   polyethylene glycol  17 g Oral BID   sodium chloride flush  3 mL Intravenous Q12H   Continuous Infusions:   PRN Meds: acetaminophen **OR** acetaminophen, guaiFENesin, HYDROcodone-acetaminophen, ipratropium-albuterol, metoprolol tartrate, oxyCODONE, senna, traZODone   Vital Signs    Vitals:   09/09/22 2259 09/10/22 0839 09/10/22 0849 09/10/22 1105  BP: 107/86 (!) 82/69 (!) 86/62 101/62  Pulse: (!) 105 100    Resp: 18 16    Temp: 97.8 F (36.6 C) 97.8 F (36.6 C)    TempSrc:  Oral    SpO2: 100% 100%    Weight:      Height:        Intake/Output Summary (Last 24 hours) at 09/10/2022 1443 Last data filed at 09/10/2022 1000 Gross per 24 hour  Intake 540 ml  Output 200 ml  Net 340 ml      09/08/2022    6:31 AM 09/06/2022    7:02 AM 09/04/2022    3:26 AM  Last 3 Weights  Weight (lbs) 190 lb 4.1 oz 184 lb 4.9 oz 171 lb 1.2 oz  Weight (kg) 86.3 kg 83.6 kg 77.6 kg      Telemetry    Atrial fibrillation rate 90-100- Personally Reviewed  ECG     - Personally Reviewed  Physical Exam   Constitutional:  oriented to person, place, and time. No distress.  HENT:  Head: Grossly normal Eyes:  no discharge. No scleral icterus.  Neck: No JVD, no carotid bruits  Cardiovascular: Regular rate and  rhythm, no murmurs appreciated Pulmonary/Chest: Clear to auscultation bilaterally, no wheezes or rails Abdominal: Soft.  no distension.  no tenderness.  Musculoskeletal: Normal range of motion Neurological:  normal muscle tone. Coordination normal. No atrophy Skin: Skin warm and dry Psychiatric: normal affect, pleasant   Labs    High Sensitivity Troponin:   Recent Labs  Lab 09/03/22 1343 09/03/22 2100  TROPONINIHS 600* 520*     Chemistry Recent Labs  Lab 09/08/22 0922 09/09/22 0521 09/10/22 0720  NA 136 135 132*  K 3.9 4.1 3.7  CL 102 101 100  CO2 25 24 25   GLUCOSE 102* 93 76  BUN 30* 31* 29*  CREATININE 1.33* 1.19* 1.12*  CALCIUM 9.0 8.7* 8.3*  MG 2.0 1.9 1.8  GFRNONAA 37* 42* 45*  ANIONGAP 9 10 7     Lipids  Recent Labs  Lab 09/04/22 0430  CHOL 109  TRIG 47  HDL 46  LDLCALC 54  CHOLHDL 2.4    Hematology Recent Labs  Lab 09/08/22 0922 09/09/22 0521 09/10/22 0720  WBC 8.7 8.7 7.0  RBC 3.40* 3.15* 2.71*  HGB 10.8* 10.1* 8.6*  HCT 32.5* 29.8* 26.3*  MCV 95.6 94.6 97.0  MCH 31.8 32.1  31.7  MCHC 33.2 33.9 32.7  RDW 14.2 14.1 14.1  PLT 145* 136* 116*   Thyroid  Recent Labs  Lab 09/05/22 0414  TSH 1.198    BNP Recent Labs  Lab 09/07/22 0452  BNP 1,384.5*    DDimer No results for input(s): "DDIMER" in the last 168 hours.   Radiology    No results found.  Cardiac Studies   Echo  1. Left ventricular ejection fraction, by estimation, is 20 to 25%. The  left ventricle has severely decreased function. Left ventricular  endocardial border not optimally defined to evaluate regional wall motion.  There is mild left ventricular  hypertrophy. Left ventricular diastolic parameters are indeterminate.   2. Right ventricular systolic function is normal. The right ventricular  size is normal. There is moderately elevated pulmonary artery systolic  pressure. The estimated right ventricular systolic pressure is 47.2 mmHg.   3. The mitral valve is  normal in structure. Mild to moderate mitral valve  regurgitation.   4. Tricuspid valve regurgitation is moderate.   5. The aortic valve has an indeterminant number of cusps. There is severe  calcifcation of the aortic valve. There is moderate thickening of the  aortic valve. Aortic valve regurgitation is trivial. Moderate aortic valve  stenosis.   6. Pulmonic valve regurgitation not well assessed.   7. The inferior vena cava is normal in size with <50% respiratory  variability, suggesting right atrial pressure of 8 mmHg.   Patient Profile     89 y.o. female with history of hypertension, breast cancer, chronic bronchitis, and prior tobacco use, whom we have been asked to see due to newly discovered atrial fibrillation and HFrEF.   Assessment & Plan    Dilated cardiomyopathy/acute systolic CHF Etiology concerning for tachycardia mediated/atrial fibrillation Though unable to exclude underlying ischemia Not a good candidate for ischemic workup given age, debility Will transition metoprolol to tartrate 25 every 6 hours to metoprolol succinate 25 twice daily Doses of metoprolol tartrate being held for hypotension so will reduce dose as above -Will transition to Eliquis 5 twice daily Does not meet criteria for reduced dose given above 60 kg, creatinine less than 1.5 -Lasix 40 daily, potassium 10 daily Additional GDMT on hold in the setting of hypotension  Persistent atrial fibrillation Presenting with RVR, improving with metoprolol tartrate 25 every 6 hours Currently on heparin infusion, will transition to Eliquis 5 twice daily No immediate plans for TEE cardioversion as clinical picture is improving though given severely reduced ejection fraction 25%, ultimate goal should be to restore normal sinus rhythm if possible -Will transition to metoprolol succinate 25 twice daily  Bilateral lower extremity cellulitis/wounds/edema Moderate improvement in leg edema with aggressive diuresis IV  Lasix twice daily Legs will need to be rewrapped as prior wraps are loose -Hospitalist has indicated plans for discharge -Will transition to Lasix 40 daily with potassium 10 daily   Total encounter time more than 50 minutes  Greater than 50% was spent in counseling and coordination of care with the patient  For questions or updates, please contact [facility] Please consult [email or web address] for contact info under        Signed, Julien Nordmann, MD  09/10/2022, 2:43 PM

## 2022-09-10 NOTE — Plan of Care (Signed)

## 2022-09-10 NOTE — Discharge Summary (Signed)
Physician Discharge Summary  Patient: Diana [name] ZOX:[id number] DOB: Jun 08, 1923   Code Status: DNR Admit date: 09/03/2022 Discharge date: 09/10/2022 Disposition: Skilled nursing facility, PT, OT, nurse aid, and RN PCP: Excell Seltzer, MD  Recommendations for Outpatient Follow-up:  Follow up with PCP within 1-2 weeks Regarding general hospital follow up and preventative care Recommend CBC, metabolic panel and monitoring leg wound healing Recommend initiating compression stockings to prevent LE edema/infection as needed Follow up with cardiology Regarding atrial fibrillation treatment Consider SGLT2i initiation at follow up  Discharge Diagnoses:  Principal Problem:   Cellulitis of lower extremity Active Problems:   Essential hypertension   Fluid overload   Troponin I above reference range   Atrial fibrillation (HCC)   Acute HFrEF (heart failure with reduced ejection fraction) (HCC)   Congestive heart failure (HCC)   Dilated cardiomyopathy [facility])  Brief Hospital Course Summary: 89 year old female with history of chronic hearing loss, progressive bilateral lower extremity weakness, atrial fibrillation, essential hypertension. Presented with worsening bilateral lower extremity swelling and erythema with blistering.  Admitted for combination of CHF exacerbation and bilateral lower extremity purulent cellulitis.  Initially placed on IV vancomycin > PO Bactrim. Eventually wound cultures grew Pseudomonas therefore transition to Cipro for 7 days total (end date 5/27).  Blood cultures were negative.  Cardiology was consulted in relation to the atrial fibrillation and new onset heart failure. EF on echo was 20-25%. She was started on a heparin gtt and rate controlled with metoprolol. She received diuresis with IV lasix.  She remained hemodynamically stable and was cleared for dc 5/24 with transitioning to eliquis and PO lasix. PT/OT recommended SNF at dc.   Discharge Condition: Good,  improved Recommended discharge diet: Regular healthy diet  Consultations: Cardiology   Procedures/Studies: Echo   Allergies as of 09/10/2022       Reactions   Meperidine Hcl Nausea And Vomiting        Medication List     STOP taking these medications    amLODipine-valsartan 10-160 MG tablet Commonly known as: EXFORGE   doxycycline 100 MG tablet Commonly known as: VIBRA-TABS   ergocalciferol 1.25 MG (50000 UT) capsule Commonly known as: VITAMIN D2   torsemide 20 MG tablet Commonly known as: DEMADEX   Vitamin D3 1.25 MG (50000 UT) Tabs       TAKE these medications    apixaban 5 MG Tabs tablet Commonly known as: ELIQUIS Take 1 tablet (5 mg total) by mouth 2 (two) times daily.   aspirin EC 81 MG tablet Take 1 tablet (81 mg total) by mouth daily. Swallow whole. Start taking on: Sep 11, 2022   atorvastatin 40 MG tablet Commonly known as: LIPITOR Take 1 tablet (40 mg total) by mouth daily. Start taking on: Sep 11, 2022   ciprofloxacin 500 MG tablet Commonly known as: CIPRO Take 1 tablet (500 mg total) by mouth daily with breakfast for 3 days. Start taking on: Sep 11, 2022   furosemide 40 MG tablet Commonly known as: LASIX Take 1 tablet (40 mg total) by mouth daily. Start taking on: Sep 11, 2022   HYDROcodone-acetaminophen 5-325 MG tablet Commonly known as: NORCO/VICODIN Take 1 tablet by mouth every 6 (six) hours as needed for up to 5 days for moderate pain. What changed:  how much to take how to take this when to take this reasons to take this additional instructions   levothyroxine 100 MCG tablet Commonly known as: SYNTHROID Take 1 tablet (100 mcg total) by  mouth daily.   metoprolol succinate 25 MG 24 hr tablet Commonly known as: TOPROL-XL Take 1 tablet (25 mg total) by mouth 2 (two) times daily. What changed:  medication strength how much to take how to take this when to take this additional instructions   senna 8.6 MG Tabs  tablet Commonly known as: SENOKOT Take 1 tablet by mouth daily as needed for mild constipation.   traZODone 50 MG tablet Commonly known as: DESYREL TAKE 1/2 TO 1 TABLET BY MOUTH AT BEDTIMEAS NEEDED FOR SLEEP        Contact information for after-discharge care     Destination     [facility] Preferred SNF .   Service: Skilled Nursing Contact information: [address] [place] [address] [phone]                    Subjective   Pt reports feeling well. She has mild tenderness to LLQ- when we look at it together, there is a mild bruise there. She is ready to go to rehab.   All questions and concerns were addressed at time of discharge.  Objective  Blood pressure 101/62, pulse 100, temperature 97.8 F (36.6 C), temperature source Oral, resp. rate 16, height 5\' 4"  (1.626 m), weight 86.3 kg, SpO2 100 %.   General: Pt is alert, awake, not in acute distress Cardiovascular: RRR, S1/S2 +, no rubs, no gallops Respiratory: CTA bilaterally, no wheezing, no rhonchi Abdominal: Soft, NT, ND, bowel sounds +. Approximately 5cm ecchymosis in LLQ Extremities: 1+ pitting edema of lower extremities bilaterally. No erythema or increased temp. Wounds healing well.  Significant bruising diffusely from venipuncture.   The results of significant diagnostics from this hospitalization (including imaging, microbiology, ancillary and laboratory) are listed below for reference.   Imaging studies: ECHOCARDIOGRAM COMPLETE  Result Date: 09/07/2022    ECHOCARDIOGRAM REPORT   Patient Name:   Diana [name] Date of Exam: 09/07/2022 Medical Rec #:  [id number]        Height:       64.0 in Accession #:    [id number]       Weight:       184.3 lb Date of Birth:  1923-12-08        BSA:          1.890 m Patient Age:    89 years         BP:           90/44 mmHg Patient Gender: F                HR:           89 bpm. Exam Location:  [facility] Procedure: 2D  Echo, Cardiac Doppler and Color Doppler Indications:     Dyspnea R06.00  History:         Patient has prior history of Echocardiogram examinations, most                  recent 06/12/2015. Signs/Symptoms:Murmur; Risk                  Factors:Hypertension. Palpitations.  Sonographer:     Cristela Blue Referring Phys:  [id number] Loura Halt AMIN Diagnosing Phys: Yvonne Kendall MD IMPRESSIONS  1. Left ventricular ejection fraction, by estimation, is 20 to 25%. The left ventricle has severely decreased function. Left ventricular endocardial border not optimally defined to evaluate regional wall motion. There is mild left ventricular hypertrophy. Left  ventricular diastolic parameters are indeterminate.  2. Right ventricular systolic function is normal. The right ventricular size is normal. There is moderately elevated pulmonary artery systolic pressure. The estimated right ventricular systolic pressure is 47.2 mmHg.  3. The mitral valve is normal in structure. Mild to moderate mitral valve regurgitation.  4. Tricuspid valve regurgitation is moderate.  5. The aortic valve has an indeterminant number of cusps. There is severe calcifcation of the aortic valve. There is moderate thickening of the aortic valve. Aortic valve regurgitation is trivial. Moderate aortic valve stenosis.  6. Pulmonic valve regurgitation not well assessed.  7. The inferior vena cava is normal in size with <50% respiratory variability, suggesting right atrial pressure of 8 mmHg. FINDINGS  Left Ventricle: Left ventricular ejection fraction, by estimation, is 20 to 25%. The left ventricle has severely decreased function. Left ventricular endocardial border not optimally defined to evaluate regional wall motion. The left ventricular internal cavity size was normal in size. There is mild left ventricular hypertrophy. Left ventricular diastolic function could not be evaluated due to atrial fibrillation. Left ventricular diastolic parameters are indeterminate.  Right Ventricle: The right ventricular size is normal. No increase in right ventricular wall thickness. Right ventricular systolic function is normal. There is moderately elevated pulmonary artery systolic pressure. The tricuspid regurgitant velocity is 3.13 m/s, and with an assumed right atrial pressure of 8 mmHg, the estimated right ventricular systolic pressure is 47.2 mmHg. Left Atrium: Left atrial size was normal in size. Right Atrium: Right atrial size was normal in size. Pericardium: There is no evidence of pericardial effusion. Mitral Valve: The mitral valve is normal in structure. Mild to moderate mitral valve regurgitation. Tricuspid Valve: The tricuspid valve is not well visualized. Tricuspid valve regurgitation is moderate. Aortic Valve: The aortic valve has an indeterminant number of cusps. There is severe calcifcation of the aortic valve. There is moderate thickening of the aortic valve. Aortic valve regurgitation is trivial. Moderate aortic stenosis is present. Aortic valve mean gradient measures 8.7 mmHg. Aortic valve peak gradient measures 15.2 mmHg. Aortic valve area, by VTI measures 1.02 cm. Pulmonic Valve: The pulmonic valve was not well visualized. Pulmonic valve regurgitation not well assessed. Aorta: The aortic root is normal in size and structure. Pulmonary Artery: The pulmonary artery is not well seen. Venous: The inferior vena cava is normal in size with less than 50% respiratory variability, suggesting right atrial pressure of 8 mmHg. IAS/Shunts: No atrial level shunt detected by color flow Doppler.  LEFT VENTRICLE PLAX 2D LVIDd:         4.90 cm LVIDs:         4.60 cm LV PW:         1.10 cm LV IVS:        1.00 cm LVOT diam:     2.00 cm LV SV:         34 LV SV Index:   18 LVOT Area:     3.14 cm  RIGHT VENTRICLE RV Basal diam:  3.50 cm RV Mid diam:    2.70 cm RV S prime:     8.59 cm/s TAPSE (M-mode): 1.9 cm LEFT ATRIUM           Index        RIGHT ATRIUM           Index LA diam:      4.20  cm 2.22 cm/m   RA Area:     17.10 cm LA Vol (A2C): 33.9 ml 17.94  ml/m  RA Volume:   45.00 ml  23.81 ml/m LA Vol (A4C): 76.5 ml 40.49 ml/m  AORTIC VALVE AV Area (Vmax):    0.82 cm AV Area (Vmean):   0.85 cm AV Area (VTI):     1.02 cm AV Vmax:           195.00 cm/s AV Vmean:          131.000 cm/s AV VTI:            0.334 m AV Peak Grad:      15.2 mmHg AV Mean Grad:      8.7 mmHg LVOT Vmax:         51.10 cm/s LVOT Vmean:        35.500 cm/s LVOT VTI:          0.109 m LVOT/AV VTI ratio: 0.33  AORTA Ao Root diam: 3.20 cm MITRAL VALVE               TRICUSPID VALVE MV Area (PHT): 7.74 cm    TR Peak grad:   39.2 mmHg MV Decel Time: 98 msec     TR Vmax:        313.00 cm/s MV E velocity: 91.70 cm/s                            SHUNTS                            Systemic VTI:  0.11 m                            Systemic Diam: 2.00 cm Yvonne Kendall MD Electronically signed by Yvonne Kendall MD Signature Date/Time: 09/07/2022/1:20:52 PM    Final    DG Chest Port 1 View  Result Date: 09/07/2022 CLINICAL DATA:  Cellulitis of lower extremity. EXAM: PORTABLE CHEST 1 VIEW COMPARISON:  09/03/2022 FINDINGS: Mild cardiac enlargement. Stable from previous exam. Blunting of bilateral costophrenic angles. Mild increase interstitial markings noted bilaterally. Bibasilar scar versus atelectasis. Visualized osseous structures appear unremarkable. IMPRESSION: 1. Mild increase interstitial markings bilaterally compatible with pulmonary edema. 2. Suspect small bilateral pleural effusions. 3. Bibasilar scar versus atelectasis. Electronically Signed   By: Signa Kell M.D.   On: 09/07/2022 08:45   US Venous Img Lower Bilateral (DVT)  Result Date: 09/03/2022 CLINICAL DATA:  Lower extremity pain and swelling EXAM: BILATERAL LOWER EXTREMITY VENOUS DOPPLER ULTRASOUND TECHNIQUE: Gray-scale sonography with compression, as well as color and duplex ultrasound, were performed to evaluate the deep venous system(s) from the level of the  common femoral vein through the popliteal and proximal calf veins. COMPARISON:  05/08/2016 FINDINGS: VENOUS Normal compressibility of the common femoral, superficial femoral, and popliteal veins, as well as the visualized calf veins. Visualized portions of profunda femoral vein and great saphenous vein unremarkable. No filling defects to suggest DVT on grayscale or color Doppler imaging. Doppler waveforms show normal direction of venous flow, normal respiratory plasticity and response to augmentation. OTHER None. Limitations: none IMPRESSION: 1. No evidence of deep venous thrombosis within either lower extremity. Electronically Signed   By: Sharlet Salina M.D.   On: 09/03/2022 21:29   DG Chest Portable 1 View  Result Date: 09/03/2022 CLINICAL DATA:  Shortness of breath EXAM: PORTABLE CHEST 1 VIEW COMPARISON:  05/21/2007 FINDINGS: Transverse diameter of heart is increased. Central pulmonary vessels  are more prominent. Small bilateral pleural effusions are seen. There is no focal pulmonary consolidation. There is a deformity in few left ribs suggesting old healed fractures. IMPRESSION: Cardiomegaly. Central pulmonary vessels are more prominent. There is interval increase in interstitial markings in the parahilar regions and lower lung fields suggesting possible CHF with mild interstitial pulmonary edema. Blunting of both lateral CP angles suggests small bilateral pleural effusions. Electronically Signed   By: Ernie Avena M.D.   On: 09/03/2022 18:10    Labs: Basic Metabolic Panel: Recent Labs  Lab 09/06/22 0421 09/07/22 0452 09/08/22 0922 09/09/22 0521 09/10/22 0720  NA 138 136 136 135 132*  K 3.7 4.3 3.9 4.1 3.7  CL 104 103 102 101 100  CO2 26 23 25 24 25   GLUCOSE 90 91 102* 93 76  BUN 28* 29* 30* 31* 29*  CREATININE 1.44* 1.38* 1.33* 1.19* 1.12*  CALCIUM 8.5* 8.5* 9.0 8.7* 8.3*  MG 1.9 2.1 2.0 1.9 1.8   CBC: Recent Labs  Lab 09/03/22 1337 09/03/22 2100 09/05/22 0416  09/06/22 0421 09/07/22 0452 09/08/22 0922 09/09/22 0521 09/10/22 0720  WBC 6.5   < > 5.2 5.3 6.9 8.7 8.7 7.0  NEUTROABS 4.2  --  3.3 3.6 5.2  --   --   --   HGB 11.6*   < > 10.4* 10.1* 10.1* 10.8* 10.1* 8.6*  HCT 36.2   < > 31.6* 30.2* 30.8* 32.5* 29.8* 26.3*  MCV 96.8   < > 96.0 95.9 95.4 95.6 94.6 97.0  PLT 180   < > 152 156 149* 145* 136* 116*   < > = values in this interval not displayed.   Microbiology: Results for orders placed or performed during the hospital encounter of 09/03/22  Culture, blood (Routine X 2) w Reflex to ID Panel     Status: None   Collection Time: 09/03/22  1:37 PM   Specimen: Left Antecubital; Blood  Result Value Ref Range Status   Specimen Description LEFT ANTECUBITAL  Final   Special Requests   Final    BOTTLES DRAWN AEROBIC AND ANAEROBIC Blood Culture results may not be optimal due to an inadequate volume of blood received in culture bottles   Culture   Final    NO GROWTH 5 DAYS Performed at [facility], [address] [place], [place] [address]    Report Status 09/08/2022 FINAL  Final  Culture, blood (Routine X 2) w Reflex to ID Panel     Status: None   Collection Time: 09/03/22  8:26 PM   Specimen: BLOOD  Result Value Ref Range Status   Specimen Description BLOOD BLOOD LEFT ARM  Final   Special Requests   Final    BOTTLES DRAWN AEROBIC AND ANAEROBIC Blood Culture adequate volume   Culture   Final    NO GROWTH 5 DAYS Performed at [facility], [address] [place], [place] [address]    Report Status 09/08/2022 FINAL  Final  Aerobic Culture w Gram Stain (superficial specimen)     Status: None   Collection Time: 09/05/22  6:00 AM   Specimen: Wound  Result Value Ref Range Status   Specimen Description   Final    WOUND LEFT LEG Performed at [facility], [address], [place], [place] [address]    Special Requests   Final    NONE Performed at [facility], [address], [place], [place]  [address]    Gram Stain   Final  MODERATE STAPHYLOCOCCUS EPIDERMIDIS NO WBC SEEN FEW GRAM NEGATIVE RODS Performed at [facility], [address], [place], [place] [address]    Culture MODERATE PSEUDOMONAS AERUGINOSA  Final   Report Status 09/07/2022 FINAL  Final   Organism ID, Bacteria PSEUDOMONAS AERUGINOSA  Final      Susceptibility   Pseudomonas aeruginosa - MIC*    CEFTAZIDIME >=64 RESISTANT Resistant     CIPROFLOXACIN <=0.25 SENSITIVE Sensitive     GENTAMICIN <=1 SENSITIVE Sensitive     IMIPENEM 2 SENSITIVE Sensitive     * MODERATE PSEUDOMONAS AERUGINOSA  Aerobic Culture w Gram Stain (superficial specimen)     Status: None   Collection Time: 09/05/22  6:00 AM   Specimen: Wound  Result Value Ref Range Status   Specimen Description   Final    WOUND RIGHT LEG Performed at [facility], [address], [place], [place] [address]    Special Requests   Final    NONE Performed at [facility], [address] [place], [place] [address]    Gram Stain   Final    RARE SQUAMOUS EPITHELIAL CELLS PRESENT NO WBC SEEN FEW GRAM NEGATIVE RODS    Culture   Final    MODERATE PSEUDOMONAS AERUGINOSA SUSCEPTIBILITIES PERFORMED ON PREVIOUS CULTURE WITHIN THE LAST 5 DAYS. Performed at [facility], [address] [place], [place] [address]    Report Status 09/07/2022 FINAL  Final   Time coordinating discharge: Over 30 minutes  Leeroy Bock, MD  [facility] 09/10/2022, 1:33 PM

## 2022-09-14 DIAGNOSIS — L03116 Cellulitis of left lower limb: Secondary | ICD-10-CM | POA: Diagnosis not present

## 2022-09-14 DIAGNOSIS — I83018 Varicose veins of right lower extremity with ulcer other part of lower leg: Secondary | ICD-10-CM | POA: Diagnosis not present

## 2022-09-14 DIAGNOSIS — M150 Primary generalized (osteo)arthritis: Secondary | ICD-10-CM | POA: Diagnosis not present

## 2022-09-14 DIAGNOSIS — I83028 Varicose veins of left lower extremity with ulcer other part of lower leg: Secondary | ICD-10-CM | POA: Diagnosis not present

## 2022-09-14 DIAGNOSIS — J440 Chronic obstructive pulmonary disease with acute lower respiratory infection: Secondary | ICD-10-CM | POA: Diagnosis not present

## 2022-09-14 DIAGNOSIS — I480 Paroxysmal atrial fibrillation: Secondary | ICD-10-CM | POA: Diagnosis not present

## 2022-09-14 DIAGNOSIS — I5021 Acute systolic (congestive) heart failure: Secondary | ICD-10-CM | POA: Diagnosis not present

## 2022-09-14 DIAGNOSIS — M6281 Muscle weakness (generalized): Secondary | ICD-10-CM | POA: Diagnosis not present

## 2022-09-14 DIAGNOSIS — L03115 Cellulitis of right lower limb: Secondary | ICD-10-CM | POA: Diagnosis not present

## 2022-09-15 DIAGNOSIS — I509 Heart failure, unspecified: Secondary | ICD-10-CM | POA: Diagnosis not present

## 2022-09-15 DIAGNOSIS — I480 Paroxysmal atrial fibrillation: Secondary | ICD-10-CM | POA: Diagnosis not present

## 2022-09-15 DIAGNOSIS — M6281 Muscle weakness (generalized): Secondary | ICD-10-CM | POA: Diagnosis not present

## 2022-09-15 DIAGNOSIS — M150 Primary generalized (osteo)arthritis: Secondary | ICD-10-CM | POA: Diagnosis not present

## 2022-09-15 DIAGNOSIS — L03119 Cellulitis of unspecified part of limb: Secondary | ICD-10-CM | POA: Diagnosis not present

## 2022-09-15 DIAGNOSIS — R278 Other lack of coordination: Secondary | ICD-10-CM | POA: Diagnosis not present

## 2022-09-15 DIAGNOSIS — L03116 Cellulitis of left lower limb: Secondary | ICD-10-CM | POA: Diagnosis not present

## 2022-09-15 DIAGNOSIS — L03115 Cellulitis of right lower limb: Secondary | ICD-10-CM | POA: Diagnosis not present

## 2022-09-15 DIAGNOSIS — M6259 Muscle wasting and atrophy, not elsewhere classified, multiple sites: Secondary | ICD-10-CM | POA: Diagnosis not present

## 2022-09-15 DIAGNOSIS — I5021 Acute systolic (congestive) heart failure: Secondary | ICD-10-CM | POA: Diagnosis not present

## 2022-09-15 DIAGNOSIS — J440 Chronic obstructive pulmonary disease with acute lower respiratory infection: Secondary | ICD-10-CM | POA: Diagnosis not present

## 2022-09-15 NOTE — Consult Note (Signed)
[facility] Customer service manager [facility]) Accountable Care Organization (ACO) [facility] Liaison Note  09/15/2022  Diana [name] 1923/11/27 [id number]  Location: [facility] RN Hospital Liaison screened the patient remotely at [facility].  Insurance: [employer] Diana [name] is a 89 y.o. female who is a Primary Care Patient of Excell Seltzer, MD. [facility] [place]). The patient was screened for readmission hospitalization with noted medium risk score for unplanned readmission risk with 1 IP in 6 months.  The patient was assessed for potential [facility]) Care Management service needs for post hospital transition for care coordination. Review of patient's electronic medical record reveals patient was admitted with CHF and discharged to SNF for ongoing STR [facility]). Will collaborate with [facility] PAC-RN for ongoing monitoring for [facility] services..   Plan: Discharged to short term rehab (SNF).   [facility] Care Management/Population Health does not replace or interfere with any arrangements made by the Inpatient Transition of Care team.   For questions contact:   Elliot Cousin, RN, BSN [facility] Liaison [place]   [facility]  Population Health Office Hours MTWF  8:00 am-6:00 pm Off on Thursday [phone] mobile [phone] [Office toll free line][facility] Office Hours are M-F 8:30 - 5 pm 24 hour nurse advise line [phone] Concierge  [name]

## 2022-09-16 DIAGNOSIS — I480 Paroxysmal atrial fibrillation: Secondary | ICD-10-CM | POA: Diagnosis not present

## 2022-09-16 DIAGNOSIS — L03115 Cellulitis of right lower limb: Secondary | ICD-10-CM | POA: Diagnosis not present

## 2022-09-16 DIAGNOSIS — M6281 Muscle weakness (generalized): Secondary | ICD-10-CM | POA: Diagnosis not present

## 2022-09-16 DIAGNOSIS — M150 Primary generalized (osteo)arthritis: Secondary | ICD-10-CM | POA: Diagnosis not present

## 2022-09-16 DIAGNOSIS — L03116 Cellulitis of left lower limb: Secondary | ICD-10-CM | POA: Diagnosis not present

## 2022-09-16 DIAGNOSIS — J440 Chronic obstructive pulmonary disease with acute lower respiratory infection: Secondary | ICD-10-CM | POA: Diagnosis not present

## 2022-09-16 DIAGNOSIS — I5021 Acute systolic (congestive) heart failure: Secondary | ICD-10-CM | POA: Diagnosis not present

## 2022-09-17 DIAGNOSIS — J440 Chronic obstructive pulmonary disease with acute lower respiratory infection: Secondary | ICD-10-CM | POA: Diagnosis not present

## 2022-09-17 DIAGNOSIS — I5021 Acute systolic (congestive) heart failure: Secondary | ICD-10-CM | POA: Diagnosis not present

## 2022-09-20 DIAGNOSIS — I480 Paroxysmal atrial fibrillation: Secondary | ICD-10-CM | POA: Diagnosis not present

## 2022-09-20 DIAGNOSIS — M6281 Muscle weakness (generalized): Secondary | ICD-10-CM | POA: Diagnosis not present

## 2022-09-20 DIAGNOSIS — M150 Primary generalized (osteo)arthritis: Secondary | ICD-10-CM | POA: Diagnosis not present

## 2022-09-20 DIAGNOSIS — L03115 Cellulitis of right lower limb: Secondary | ICD-10-CM | POA: Diagnosis not present

## 2022-09-20 DIAGNOSIS — L03116 Cellulitis of left lower limb: Secondary | ICD-10-CM | POA: Diagnosis not present

## 2022-09-20 DIAGNOSIS — J440 Chronic obstructive pulmonary disease with acute lower respiratory infection: Secondary | ICD-10-CM | POA: Diagnosis not present

## 2022-09-20 DIAGNOSIS — K5909 Other constipation: Secondary | ICD-10-CM | POA: Diagnosis not present

## 2022-09-20 DIAGNOSIS — I5021 Acute systolic (congestive) heart failure: Secondary | ICD-10-CM | POA: Diagnosis not present

## 2022-09-21 DIAGNOSIS — J440 Chronic obstructive pulmonary disease with acute lower respiratory infection: Secondary | ICD-10-CM | POA: Diagnosis not present

## 2022-09-21 DIAGNOSIS — I480 Paroxysmal atrial fibrillation: Secondary | ICD-10-CM | POA: Diagnosis not present

## 2022-09-21 DIAGNOSIS — I83028 Varicose veins of left lower extremity with ulcer other part of lower leg: Secondary | ICD-10-CM | POA: Diagnosis not present

## 2022-09-21 DIAGNOSIS — I83018 Varicose veins of right lower extremity with ulcer other part of lower leg: Secondary | ICD-10-CM | POA: Diagnosis not present

## 2022-09-21 DIAGNOSIS — I5021 Acute systolic (congestive) heart failure: Secondary | ICD-10-CM | POA: Diagnosis not present

## 2022-09-21 DIAGNOSIS — I509 Heart failure, unspecified: Secondary | ICD-10-CM | POA: Diagnosis not present

## 2022-09-21 DIAGNOSIS — M6259 Muscle wasting and atrophy, not elsewhere classified, multiple sites: Secondary | ICD-10-CM | POA: Diagnosis not present

## 2022-09-21 DIAGNOSIS — L03115 Cellulitis of right lower limb: Secondary | ICD-10-CM | POA: Diagnosis not present

## 2022-09-21 DIAGNOSIS — I4891 Unspecified atrial fibrillation: Secondary | ICD-10-CM | POA: Diagnosis not present

## 2022-09-21 DIAGNOSIS — L03119 Cellulitis of unspecified part of limb: Secondary | ICD-10-CM | POA: Diagnosis not present

## 2022-09-21 DIAGNOSIS — R278 Other lack of coordination: Secondary | ICD-10-CM | POA: Diagnosis not present

## 2022-09-21 DIAGNOSIS — M150 Primary generalized (osteo)arthritis: Secondary | ICD-10-CM | POA: Diagnosis not present

## 2022-09-21 DIAGNOSIS — L03116 Cellulitis of left lower limb: Secondary | ICD-10-CM | POA: Diagnosis not present

## 2022-09-21 DIAGNOSIS — M6281 Muscle weakness (generalized): Secondary | ICD-10-CM | POA: Diagnosis not present

## 2022-09-22 DIAGNOSIS — M150 Primary generalized (osteo)arthritis: Secondary | ICD-10-CM | POA: Diagnosis not present

## 2022-09-22 DIAGNOSIS — K5909 Other constipation: Secondary | ICD-10-CM | POA: Diagnosis not present

## 2022-09-22 DIAGNOSIS — L03115 Cellulitis of right lower limb: Secondary | ICD-10-CM | POA: Diagnosis not present

## 2022-09-22 DIAGNOSIS — I5021 Acute systolic (congestive) heart failure: Secondary | ICD-10-CM | POA: Diagnosis not present

## 2022-09-22 DIAGNOSIS — I9589 Other hypotension: Secondary | ICD-10-CM | POA: Diagnosis not present

## 2022-09-22 DIAGNOSIS — L03116 Cellulitis of left lower limb: Secondary | ICD-10-CM | POA: Diagnosis not present

## 2022-09-22 DIAGNOSIS — M6281 Muscle weakness (generalized): Secondary | ICD-10-CM | POA: Diagnosis not present

## 2022-09-22 DIAGNOSIS — J440 Chronic obstructive pulmonary disease with acute lower respiratory infection: Secondary | ICD-10-CM | POA: Diagnosis not present

## 2022-09-22 DIAGNOSIS — I480 Paroxysmal atrial fibrillation: Secondary | ICD-10-CM | POA: Diagnosis not present

## 2022-09-23 DIAGNOSIS — M150 Primary generalized (osteo)arthritis: Secondary | ICD-10-CM | POA: Diagnosis not present

## 2022-09-23 DIAGNOSIS — M6281 Muscle weakness (generalized): Secondary | ICD-10-CM | POA: Diagnosis not present

## 2022-09-23 DIAGNOSIS — I5021 Acute systolic (congestive) heart failure: Secondary | ICD-10-CM | POA: Diagnosis not present

## 2022-09-23 DIAGNOSIS — K5909 Other constipation: Secondary | ICD-10-CM | POA: Diagnosis not present

## 2022-09-23 DIAGNOSIS — J440 Chronic obstructive pulmonary disease with acute lower respiratory infection: Secondary | ICD-10-CM | POA: Diagnosis not present

## 2022-09-23 DIAGNOSIS — L03115 Cellulitis of right lower limb: Secondary | ICD-10-CM | POA: Diagnosis not present

## 2022-09-23 DIAGNOSIS — L03116 Cellulitis of left lower limb: Secondary | ICD-10-CM | POA: Diagnosis not present

## 2022-09-23 DIAGNOSIS — I480 Paroxysmal atrial fibrillation: Secondary | ICD-10-CM | POA: Diagnosis not present

## 2022-09-24 DIAGNOSIS — M6259 Muscle wasting and atrophy, not elsewhere classified, multiple sites: Secondary | ICD-10-CM | POA: Diagnosis not present

## 2022-09-24 DIAGNOSIS — L03119 Cellulitis of unspecified part of limb: Secondary | ICD-10-CM | POA: Diagnosis not present

## 2022-09-24 DIAGNOSIS — R278 Other lack of coordination: Secondary | ICD-10-CM | POA: Diagnosis not present

## 2022-09-24 DIAGNOSIS — I509 Heart failure, unspecified: Secondary | ICD-10-CM | POA: Diagnosis not present

## 2022-09-24 DIAGNOSIS — M6281 Muscle weakness (generalized): Secondary | ICD-10-CM | POA: Diagnosis not present

## 2022-09-24 DIAGNOSIS — I480 Paroxysmal atrial fibrillation: Secondary | ICD-10-CM | POA: Diagnosis not present

## 2022-09-27 ENCOUNTER — Other Ambulatory Visit: Payer: Self-pay | Admitting: Family Medicine

## 2022-09-27 DIAGNOSIS — I83028 Varicose veins of left lower extremity with ulcer other part of lower leg: Secondary | ICD-10-CM | POA: Diagnosis not present

## 2022-09-27 DIAGNOSIS — I83018 Varicose veins of right lower extremity with ulcer other part of lower leg: Secondary | ICD-10-CM | POA: Diagnosis not present

## 2022-09-27 NOTE — Telephone Encounter (Signed)
Patient was d/c from this at hospital on 09/10/22 ok to decline refill?

## 2022-09-28 ENCOUNTER — Telehealth: Payer: Self-pay | Admitting: Family Medicine

## 2022-09-28 NOTE — Telephone Encounter (Signed)
Handicap Placard placed in Dr. Daphine Deutscher office in box to sign.

## 2022-09-28 NOTE — Telephone Encounter (Signed)
Completed and in outbox. 

## 2022-09-28 NOTE — Telephone Encounter (Signed)
Type of forms received: handicap parking placard   Routed to: bedsole's pool   Paperwork received by : Audree Camel    Individual made aware of 3-5 business day turn around (Y/N): Y   Form completed and patient made aware of charges(Y/N): Y    Faxed to : pt's son, Smitty Cords, requested a call back @ [phone] once ppw is comp   Form location:  bedsole's folder

## 2022-09-29 DIAGNOSIS — R278 Other lack of coordination: Secondary | ICD-10-CM | POA: Diagnosis not present

## 2022-09-29 DIAGNOSIS — I480 Paroxysmal atrial fibrillation: Secondary | ICD-10-CM | POA: Diagnosis not present

## 2022-09-29 DIAGNOSIS — I509 Heart failure, unspecified: Secondary | ICD-10-CM | POA: Diagnosis not present

## 2022-09-29 DIAGNOSIS — M6259 Muscle wasting and atrophy, not elsewhere classified, multiple sites: Secondary | ICD-10-CM | POA: Diagnosis not present

## 2022-09-29 DIAGNOSIS — L03119 Cellulitis of unspecified part of limb: Secondary | ICD-10-CM | POA: Diagnosis not present

## 2022-09-29 DIAGNOSIS — M6281 Muscle weakness (generalized): Secondary | ICD-10-CM | POA: Diagnosis not present

## 2022-09-29 NOTE — Telephone Encounter (Signed)
Bruce notified by telephone that Handicap Placard is ready to be picked up at the front desk.

## 2022-10-05 ENCOUNTER — Other Ambulatory Visit: Payer: Self-pay | Admitting: *Deleted

## 2022-10-05 DIAGNOSIS — K5903 Drug induced constipation: Secondary | ICD-10-CM | POA: Diagnosis not present

## 2022-10-05 DIAGNOSIS — S41112S Laceration without foreign body of left upper arm, sequela: Secondary | ICD-10-CM | POA: Diagnosis not present

## 2022-10-05 DIAGNOSIS — S81812S Laceration without foreign body, left lower leg, sequela: Secondary | ICD-10-CM | POA: Diagnosis not present

## 2022-10-05 DIAGNOSIS — R278 Other lack of coordination: Secondary | ICD-10-CM | POA: Diagnosis not present

## 2022-10-05 DIAGNOSIS — I509 Heart failure, unspecified: Secondary | ICD-10-CM | POA: Diagnosis not present

## 2022-10-05 DIAGNOSIS — M6259 Muscle wasting and atrophy, not elsewhere classified, multiple sites: Secondary | ICD-10-CM | POA: Diagnosis not present

## 2022-10-05 DIAGNOSIS — I83018 Varicose veins of right lower extremity with ulcer other part of lower leg: Secondary | ICD-10-CM | POA: Diagnosis not present

## 2022-10-05 DIAGNOSIS — I480 Paroxysmal atrial fibrillation: Secondary | ICD-10-CM | POA: Diagnosis not present

## 2022-10-05 DIAGNOSIS — S51812S Laceration without foreign body of left forearm, sequela: Secondary | ICD-10-CM | POA: Diagnosis not present

## 2022-10-05 DIAGNOSIS — I83028 Varicose veins of left lower extremity with ulcer other part of lower leg: Secondary | ICD-10-CM | POA: Diagnosis not present

## 2022-10-05 DIAGNOSIS — L03119 Cellulitis of unspecified part of limb: Secondary | ICD-10-CM | POA: Diagnosis not present

## 2022-10-05 DIAGNOSIS — M6281 Muscle weakness (generalized): Secondary | ICD-10-CM | POA: Diagnosis not present

## 2022-10-05 NOTE — Patient Outreach (Signed)
Late entry for 10/04/22  Per [facility]  Mrs. Shupe resides in [facility] skilled nursing facility. Screening for potential care coordination services as benefit of health plan and Primary Care Provider.   Secure communication sent to Terri, Child psychotherapist for collaboration about transition plans and potential care coordination needs.   Will continue to follow.   [name], MSN, RN,BSN Post Acute Care Coordinator [phone] (Direct dial)

## 2022-10-08 DIAGNOSIS — I509 Heart failure, unspecified: Secondary | ICD-10-CM | POA: Diagnosis not present

## 2022-10-08 DIAGNOSIS — M6259 Muscle wasting and atrophy, not elsewhere classified, multiple sites: Secondary | ICD-10-CM | POA: Diagnosis not present

## 2022-10-08 DIAGNOSIS — I480 Paroxysmal atrial fibrillation: Secondary | ICD-10-CM | POA: Diagnosis not present

## 2022-10-08 DIAGNOSIS — R278 Other lack of coordination: Secondary | ICD-10-CM | POA: Diagnosis not present

## 2022-10-08 DIAGNOSIS — L03119 Cellulitis of unspecified part of limb: Secondary | ICD-10-CM | POA: Diagnosis not present

## 2022-10-08 DIAGNOSIS — M6281 Muscle weakness (generalized): Secondary | ICD-10-CM | POA: Diagnosis not present

## 2022-10-11 NOTE — Progress Notes (Signed)
Diana [name] ([id number]) [id number] Page 1 of 6 Visit Report for 09/03/2022 Arrival Information Details Patient Name: Date of Service: Diana [name], Diana [name] 09/03/2022 12:15 PM Medical Record Number: [id number] Patient Account Number: [id number] Date of Birth/Sex: Treating RN: 02/15/1924 (89 y.o. Diana [name] Primary Care Diana [name]: Diana [name] Other Clinician: Referring Diana [name]: Treating Diana [name]/Extender: Diana [name], Diana [name] in Treatment: 0 Visit Information History Since Last Visit Added or deleted any medications: No Patient Arrived: Wheel Chair Any new allergies or adverse reactions: No Arrival Time: 12:42 Had a fall or experienced change in No Accompanied By: family activities of daily living that may affect Transfer Assistance: EasyPivot Patient Lift risk of falls: Patient Identification Verified: Yes Hospitalized since last visit: No Secondary Verification Process Completed: Yes Has Dressing in Place as Prescribed: Yes Patient Requires Transmission-Based Precautions: No Has Compression in Place as Prescribed: Yes Patient Has Alerts: Yes Pain Present Now: Yes Patient Alerts: Patient on Blood Thinner NOT DIABETIC Electronic Signature([name]) Signed: 09/03/2022 1:31:41 PM By: Diana [name] Entered By: Diana [name] on 09/03/2022 12:43:17 -------------------------------------------------------------------------------- Clinic Level of Care Assessment Details Patient Name: Date of Service: Diana [name] 09/03/2022 12:15 PM Medical Record Number: [id number] Patient Account Number: [id number] Date of Birth/Sex: Treating RN: 1923/06/08 (89 y.o. Diana [name] Primary Care Diana [name]: Diana [name] Other Clinician: Referring Diana [name]: Treating Diana [name]/Extender: Diana [name], Diana [name] in Treatment: 0 Clinic Level of Care Assessment Items TOOL 4 Quantity Score []  - 0 Use when only an EandM is performed on FOLLOW-UP  visit ASSESSMENTS - Nursing Assessment / Reassessment X- 1 10 Reassessment of Co-morbidities (includes updates in patient status) X- 1 5 Reassessment of Adherence to Treatment Plan ASSESSMENTS - Wound and Skin A ssessment / Reassessment []  - 0 Simple Wound Assessment / Reassessment - one wound Diana [name], Diana [name] ([id number]) [id number] Page 2 of 6 X- 2 5 Complex Wound Assessment / Reassessment - multiple wounds []  - 0 Dermatologic / Skin Assessment (not related to wound area) ASSESSMENTS - Focused Assessment []  - 0 Circumferential Edema Measurements - multi extremities []  - 0 Nutritional Assessment / Counseling / Intervention []  - 0 Lower Extremity Assessment (monofilament, tuning fork, pulses) []  - 0 Peripheral Arterial Disease Assessment (using hand held doppler) ASSESSMENTS - Ostomy and/or Continence Assessment and Care []  - 0 Incontinence Assessment and Management []  - 0 Ostomy Care Assessment and Management (repouching, etc.) PROCESS - Coordination of Care X - Simple Patient / Family Education for ongoing care 1 15 []  - 0 Complex (extensive) Patient / Family Education for ongoing care X- 1 10 Staff obtains Chiropractor, Records, T Results / Process Orders est []  - 0 Staff telephones HHA, Nursing Homes / Clarify orders / etc []  - 0 Routine Transfer to another Facility (non-emergent condition) []  - 0 Routine Hospital Admission (non-emergent condition) []  - 0 New Admissions / Manufacturing engineer / Ordering NPWT Apligraf, etc. , []  - 0 Emergency Hospital Admission (emergent condition) X- 1 10 Simple Discharge Coordination []  - 0 Complex (extensive) Discharge Coordination PROCESS - Special Needs []  - 0 Pediatric / Minor Patient Management []  - 0 Isolation Patient Management []  - 0 Hearing / Language / Visual special needs []  - 0 Assessment of Community assistance (transportation, D/[name] planning, etc.) []  - 0 Additional assistance /  Altered mentation []  - 0 Support Surface([name]) Assessment (bed, cushion, seat, etc.) INTERVENTIONS - Wound Cleansing / Measurement []  - 0 Simple Wound Cleansing - one wound X- 2 5 Complex Wound Cleansing - multiple wounds  X- 1 5 Wound Imaging (photographs - any number of wounds) []  - 0 Wound Tracing (instead of photographs) []  - 0 Simple Wound Measurement - one wound X- 2 5 Complex Wound Measurement - multiple wounds INTERVENTIONS - Wound Dressings X - Small Wound Dressing one or multiple wounds 2 10 []  - 0 Medium Wound Dressing one or multiple wounds []  - 0 Large Wound Dressing one or multiple wounds []  - 0 Application of Medications - topical []  - 0 Application of Medications - injection INTERVENTIONS - Miscellaneous []  - 0 External ear exam []  - 0 Specimen Collection (cultures, biopsies, blood, body fluids, etc.) []  - 0 Specimen([name]) / Culture([name]) sent or taken to Lab for analysis Diana [name], Diana [name] ([id number]) [id number].pdf Page 3 of 6 []  - 0 Patient Transfer (multiple staff / Nurse, adult / Similar devices) []  - 0 Simple Staple / Suture removal (25 or less) []  - 0 Complex Staple / Suture removal (26 or more) []  - 0 Hypo / Hyperglycemic Management (close monitor of Blood Glucose) []  - 0 Ankle / Brachial Index (ABI) - do not check if billed separately []  - 0 Vital Signs Has the patient been seen at the hospital within the last three years: Yes Total Score: 105 Level Of Care: New/Established - Level 3 Electronic Signature([name]) Signed: 09/03/2022 1:31:41 PM By: Diana [name] Entered By: Diana [name] on 09/03/2022 13:12:18 -------------------------------------------------------------------------------- Encounter Discharge Information Details Patient Name: Date of Service: Diana [name] 09/03/2022 12:15 PM Medical Record Number: [id number] Patient Account Number: [id number] Date of Birth/Sex: Treating RN: [birth year] (89 y.o. Diana [name] Primary Care Diana [name]: Diana [name] Other Clinician: Referring Diana [name]: Treating Diana [name]/Extender: Diana [name], Diana [name] in Treatment: 0 Encounter Discharge Information Items Discharge Condition: Stable Ambulatory Status: Wheelchair Discharge Destination: Emergency Room Telephoned: Yes Spoke With: Marylene Land triage nurse Orders Sent: No Transportation: Private Auto Accompanied By: son Schedule Follow-up Appointment: Yes Clinical Summary of Care: Electronic Signature([name]) Signed: 09/03/2022 1:31:41 PM By: Diana [name] Entered By: Diana [name] on 09/03/2022 13:11:25 -------------------------------------------------------------------------------- Multi-Disciplinary Care Plan Details Patient Name: Date of Service: Diana [name] 09/03/2022 12:15 PM Diana [name] ([id number]) [id number] Page 4 of 6 Medical Record Number: [id number] Patient Account Number: [id number] Date of Birth/Sex: Treating RN: 1923/06/26 (89 y.o. Skip Mayer Primary Care Iva Montelongo: Diana [name] Other Clinician: Referring Terrian Ridlon: Treating Tequan Redmon/Extender: Diana [name], Diana [name] in Treatment: 0 Active Inactive Electronic Signature([name]) Signed: 10/11/2022 9:40:14 AM By: Elliot Gurney, BSN, RN, CWS, Kim RN, BSN Entered By: Elliot Gurney, BSN, RN, CWS, Kim on 10/11/2022 09:40:14 -------------------------------------------------------------------------------- Wound Assessment Details Patient Name: Date of Service: Diana [name], Diana [name] 09/03/2022 12:15 PM Medical Record Number: [id number] Patient Account Number: [id number] Date of Birth/Sex: Treating RN: 03-21-1924 (89 y.o. Diana [name] Primary Care Cherell Colvin: Diana [name] Other Clinician: Referring Demitrios Molyneux: Treating Virjean Boman/Extender: Diana [name], Diana [name] in Treatment: 0 Wound Status Wound Number: 1 Primary Venous Leg Ulcer Etiology: Wound Location: Right, Posterior Lower Leg Wound Open Wounding  Event: Gradually Appeared Status: Date Acquired: 08/18/2022 Comorbid Chronic Obstructive Pulmonary Disease (COPD), [name] Of Treatment: 0 History: Hypertension, Osteoarthritis Clustered Wound: No Photos Wound Measurements Length: (cm) 2.6 Width: (cm) 2.5 Depth: (cm) 0.1 Area: (cm) 5.105 Volume: (cm) 0.511 % Reduction in Area: 32.3% % Reduction in Volume: 32.2% Epithelialization: None Tunneling: No Undermining: No Wound Description Classification: Full Thickness Without Exposed Support Wound Margin: Flat and Intact Exudate Amount: Large Exudate Type: Serous Exudate Color: amber Structures Slough/Fibrino Yes Wound Bed Diana [name], Diana [name] ([id number]) [id number]  Page 5 of 6 Granulation Amount: None Present (0%) Exposed Structure Necrotic Amount: Large (67-100%) Fascia Exposed: No Necrotic Quality: Eschar, Adherent Slough Fat Layer (Subcutaneous Tissue) Exposed: No Tendon Exposed: No Muscle Exposed: No Joint Exposed: No Bone Exposed: No Assessment Notes swelling into knee, lower thigh Electronic Signature([name]) Signed: 09/03/2022 1:31:41 PM By: Diana [name] Entered By: Diana [name] on 09/03/2022 13:14:41 -------------------------------------------------------------------------------- Wound Assessment Details Patient Name: Date of Service: Diana [name], Diana [name] 09/03/2022 12:15 PM Medical Record Number: [id number] Patient Account Number: [id number] Date of Birth/Sex: Treating RN: 1923/10/27 (89 y.o. Diana [name] Primary Care Jermisha Hoffart: Diana [name] Other Clinician: Referring Makayle Krahn: Treating Joellen Tullos/Extender: Diana [name], Diana [name] in Treatment: 0 Wound Status Wound Number: 2 Primary Venous Leg Ulcer Etiology: Wound Location: Left, Lateral Lower Leg Wound Open Wounding Event: Gradually Appeared Status: Date Acquired: 08/18/2022 Comorbid Chronic Obstructive Pulmonary Disease (COPD), [name] Of Treatment: 0 History: Hypertension,  Osteoarthritis Clustered Wound: Yes Photos Wound Measurements Length: (cm) Width: (cm) Depth: (cm) Clustered Quantity: Area: (cm) Volume: (cm) 10.5 % Reduction in Area: -51% 11 % Reduction in Volume: -51% 0.2 Epithelialization: None 3 Tunneling: No 90.713 Undermining: No 18.143 Wound Description Classification: Full Thickness Without Exposed Support Structures Wound Margin: Indistinct, nonvisible Exudate Amount: Medium Exudate Type: Serous Exudate Color: amber Diana [name], Diana [name] ([id number]) Foul Odor After Cleansing: No Slough/Fibrino Yes [phone].pdf Page 6 of 6 Wound Bed Granulation Amount: Small (1-33%) Exposed Structure Granulation Quality: Pink, Pale Fascia Exposed: No Necrotic Amount: Large (67-100%) Fat Layer (Subcutaneous Tissue) Exposed: Yes Necrotic Quality: Adherent Slough Tendon Exposed: No Muscle Exposed: No Joint Exposed: No Bone Exposed: No Assessment Notes swelling into knees, thigh, redness, warmth, increased drainage Electronic Signature([name]) Signed: 09/03/2022 1:31:41 PM By: Diana [name] Entered By: Diana [name] on 09/03/2022 13:14:23

## 2022-10-12 DIAGNOSIS — J440 Chronic obstructive pulmonary disease with acute lower respiratory infection: Secondary | ICD-10-CM | POA: Diagnosis not present

## 2022-10-12 DIAGNOSIS — I83028 Varicose veins of left lower extremity with ulcer other part of lower leg: Secondary | ICD-10-CM | POA: Diagnosis not present

## 2022-10-12 DIAGNOSIS — I5021 Acute systolic (congestive) heart failure: Secondary | ICD-10-CM | POA: Diagnosis not present

## 2022-10-13 ENCOUNTER — Other Ambulatory Visit: Payer: Self-pay

## 2022-10-13 ENCOUNTER — Emergency Department (HOSPITAL_COMMUNITY): Payer: Medicare Other

## 2022-10-13 ENCOUNTER — Encounter (HOSPITAL_COMMUNITY): Payer: Self-pay

## 2022-10-13 ENCOUNTER — Inpatient Hospital Stay (HOSPITAL_COMMUNITY)
Admission: EM | Admit: 2022-10-13 | Discharge: 2022-10-26 | DRG: 184 | Disposition: A | Discharge status: Skilled Nursing Facility | Payer: Medicare Other | Source: Skilled Nursing Facility | Attending: Internal Medicine | Admitting: Internal Medicine

## 2022-10-13 DIAGNOSIS — S0181XA Laceration without foreign body of other part of head, initial encounter: Secondary | ICD-10-CM | POA: Diagnosis present

## 2022-10-13 DIAGNOSIS — Z743 Need for continuous supervision: Secondary | ICD-10-CM | POA: Diagnosis not present

## 2022-10-13 DIAGNOSIS — L03113 Cellulitis of right upper limb: Secondary | ICD-10-CM | POA: Diagnosis not present

## 2022-10-13 DIAGNOSIS — E039 Hypothyroidism, unspecified: Secondary | ICD-10-CM | POA: Diagnosis present

## 2022-10-13 DIAGNOSIS — R531 Weakness: Secondary | ICD-10-CM | POA: Diagnosis not present

## 2022-10-13 DIAGNOSIS — S299XXA Unspecified injury of thorax, initial encounter: Secondary | ICD-10-CM | POA: Diagnosis not present

## 2022-10-13 DIAGNOSIS — M25421 Effusion, right elbow: Secondary | ICD-10-CM | POA: Diagnosis not present

## 2022-10-13 DIAGNOSIS — E876 Hypokalemia: Secondary | ICD-10-CM | POA: Diagnosis not present

## 2022-10-13 DIAGNOSIS — R1312 Dysphagia, oropharyngeal phase: Secondary | ICD-10-CM | POA: Diagnosis not present

## 2022-10-13 DIAGNOSIS — Z79899 Other long term (current) drug therapy: Secondary | ICD-10-CM

## 2022-10-13 DIAGNOSIS — D329 Benign neoplasm of meninges, unspecified: Secondary | ICD-10-CM | POA: Diagnosis not present

## 2022-10-13 DIAGNOSIS — Z853 Personal history of malignant neoplasm of breast: Secondary | ICD-10-CM

## 2022-10-13 DIAGNOSIS — E871 Hypo-osmolality and hyponatremia: Secondary | ICD-10-CM | POA: Diagnosis not present

## 2022-10-13 DIAGNOSIS — S0003XA Contusion of scalp, initial encounter: Secondary | ICD-10-CM | POA: Diagnosis not present

## 2022-10-13 DIAGNOSIS — K5903 Drug induced constipation: Secondary | ICD-10-CM | POA: Diagnosis not present

## 2022-10-13 DIAGNOSIS — R079 Chest pain, unspecified: Secondary | ICD-10-CM | POA: Diagnosis not present

## 2022-10-13 DIAGNOSIS — E785 Hyperlipidemia, unspecified: Secondary | ICD-10-CM | POA: Diagnosis present

## 2022-10-13 DIAGNOSIS — R109 Unspecified abdominal pain: Secondary | ICD-10-CM | POA: Diagnosis not present

## 2022-10-13 DIAGNOSIS — I110 Hypertensive heart disease with heart failure: Secondary | ICD-10-CM | POA: Diagnosis not present

## 2022-10-13 DIAGNOSIS — Y92009 Unspecified place in unspecified non-institutional (private) residence as the place of occurrence of the external cause: Secondary | ICD-10-CM | POA: Diagnosis not present

## 2022-10-13 DIAGNOSIS — I5021 Acute systolic (congestive) heart failure: Secondary | ICD-10-CM | POA: Diagnosis not present

## 2022-10-13 DIAGNOSIS — S01312A Laceration without foreign body of left ear, initial encounter: Secondary | ICD-10-CM | POA: Diagnosis not present

## 2022-10-13 DIAGNOSIS — Z87891 Personal history of nicotine dependence: Secondary | ICD-10-CM

## 2022-10-13 DIAGNOSIS — K5909 Other constipation: Secondary | ICD-10-CM | POA: Diagnosis not present

## 2022-10-13 DIAGNOSIS — S2249XA Multiple fractures of ribs, unspecified side, initial encounter for closed fracture: Secondary | ICD-10-CM | POA: Diagnosis present

## 2022-10-13 DIAGNOSIS — R0902 Hypoxemia: Secondary | ICD-10-CM | POA: Diagnosis present

## 2022-10-13 DIAGNOSIS — Z23 Encounter for immunization: Secondary | ICD-10-CM

## 2022-10-13 DIAGNOSIS — S0512XA Contusion of eyeball and orbital tissues, left eye, initial encounter: Secondary | ICD-10-CM | POA: Diagnosis present

## 2022-10-13 DIAGNOSIS — J440 Chronic obstructive pulmonary disease with acute lower respiratory infection: Secondary | ICD-10-CM | POA: Diagnosis not present

## 2022-10-13 DIAGNOSIS — S0990XA Unspecified injury of head, initial encounter: Secondary | ICD-10-CM | POA: Diagnosis not present

## 2022-10-13 DIAGNOSIS — D6832 Hemorrhagic disorder due to extrinsic circulating anticoagulants: Secondary | ICD-10-CM | POA: Diagnosis present

## 2022-10-13 DIAGNOSIS — M7989 Other specified soft tissue disorders: Secondary | ICD-10-CM | POA: Diagnosis not present

## 2022-10-13 DIAGNOSIS — I4891 Unspecified atrial fibrillation: Secondary | ICD-10-CM | POA: Diagnosis not present

## 2022-10-13 DIAGNOSIS — S199XXA Unspecified injury of neck, initial encounter: Secondary | ICD-10-CM | POA: Diagnosis not present

## 2022-10-13 DIAGNOSIS — G4700 Insomnia, unspecified: Secondary | ICD-10-CM | POA: Diagnosis not present

## 2022-10-13 DIAGNOSIS — E875 Hyperkalemia: Secondary | ICD-10-CM | POA: Diagnosis not present

## 2022-10-13 DIAGNOSIS — Y92099 Unspecified place in other non-institutional residence as the place of occurrence of the external cause: Secondary | ICD-10-CM | POA: Diagnosis not present

## 2022-10-13 DIAGNOSIS — J449 Chronic obstructive pulmonary disease, unspecified: Secondary | ICD-10-CM | POA: Diagnosis present

## 2022-10-13 DIAGNOSIS — M6281 Muscle weakness (generalized): Secondary | ICD-10-CM | POA: Diagnosis not present

## 2022-10-13 DIAGNOSIS — Y93E9 Activity, other interior property and clothing maintenance: Secondary | ICD-10-CM

## 2022-10-13 DIAGNOSIS — M6258 Muscle wasting and atrophy, not elsewhere classified, other site: Secondary | ICD-10-CM | POA: Diagnosis not present

## 2022-10-13 DIAGNOSIS — Z043 Encounter for examination and observation following other accident: Secondary | ICD-10-CM | POA: Diagnosis not present

## 2022-10-13 DIAGNOSIS — I482 Chronic atrial fibrillation, unspecified: Secondary | ICD-10-CM | POA: Diagnosis not present

## 2022-10-13 DIAGNOSIS — S301XXA Contusion of abdominal wall, initial encounter: Secondary | ICD-10-CM | POA: Diagnosis present

## 2022-10-13 DIAGNOSIS — Z833 Family history of diabetes mellitus: Secondary | ICD-10-CM

## 2022-10-13 DIAGNOSIS — K8020 Calculus of gallbladder without cholecystitis without obstruction: Secondary | ICD-10-CM | POA: Diagnosis not present

## 2022-10-13 DIAGNOSIS — I2510 Atherosclerotic heart disease of native coronary artery without angina pectoris: Secondary | ICD-10-CM | POA: Diagnosis present

## 2022-10-13 DIAGNOSIS — Z7401 Bed confinement status: Secondary | ICD-10-CM | POA: Diagnosis not present

## 2022-10-13 DIAGNOSIS — H9190 Unspecified hearing loss, unspecified ear: Secondary | ICD-10-CM | POA: Diagnosis present

## 2022-10-13 DIAGNOSIS — I480 Paroxysmal atrial fibrillation: Secondary | ICD-10-CM | POA: Diagnosis not present

## 2022-10-13 DIAGNOSIS — E222 Syndrome of inappropriate secretion of antidiuretic hormone: Secondary | ICD-10-CM | POA: Diagnosis present

## 2022-10-13 DIAGNOSIS — S2242XD Multiple fractures of ribs, left side, subsequent encounter for fracture with routine healing: Secondary | ICD-10-CM | POA: Diagnosis not present

## 2022-10-13 DIAGNOSIS — L03116 Cellulitis of left lower limb: Secondary | ICD-10-CM | POA: Diagnosis not present

## 2022-10-13 DIAGNOSIS — M7981 Nontraumatic hematoma of soft tissue: Secondary | ICD-10-CM | POA: Diagnosis not present

## 2022-10-13 DIAGNOSIS — I6502 Occlusion and stenosis of left vertebral artery: Secondary | ICD-10-CM | POA: Diagnosis not present

## 2022-10-13 DIAGNOSIS — R278 Other lack of coordination: Secondary | ICD-10-CM | POA: Diagnosis not present

## 2022-10-13 DIAGNOSIS — I509 Heart failure, unspecified: Secondary | ICD-10-CM

## 2022-10-13 DIAGNOSIS — I5022 Chronic systolic (congestive) heart failure: Secondary | ICD-10-CM | POA: Diagnosis present

## 2022-10-13 DIAGNOSIS — J4489 Other specified chronic obstructive pulmonary disease: Secondary | ICD-10-CM | POA: Diagnosis not present

## 2022-10-13 DIAGNOSIS — Z9011 Acquired absence of right breast and nipple: Secondary | ICD-10-CM

## 2022-10-13 DIAGNOSIS — I10 Essential (primary) hypertension: Secondary | ICD-10-CM | POA: Diagnosis not present

## 2022-10-13 DIAGNOSIS — T45515A Adverse effect of anticoagulants, initial encounter: Secondary | ICD-10-CM | POA: Diagnosis present

## 2022-10-13 DIAGNOSIS — G2581 Restless legs syndrome: Secondary | ICD-10-CM | POA: Diagnosis present

## 2022-10-13 DIAGNOSIS — Z7982 Long term (current) use of aspirin: Secondary | ICD-10-CM

## 2022-10-13 DIAGNOSIS — R935 Abnormal findings on diagnostic imaging of other abdominal regions, including retroperitoneum: Secondary | ICD-10-CM | POA: Diagnosis not present

## 2022-10-13 DIAGNOSIS — I5032 Chronic diastolic (congestive) heart failure: Secondary | ICD-10-CM | POA: Diagnosis not present

## 2022-10-13 DIAGNOSIS — Z66 Do not resuscitate: Secondary | ICD-10-CM | POA: Diagnosis not present

## 2022-10-13 DIAGNOSIS — W1830XA Fall on same level, unspecified, initial encounter: Secondary | ICD-10-CM | POA: Diagnosis not present

## 2022-10-13 DIAGNOSIS — R54 Age-related physical debility: Secondary | ICD-10-CM | POA: Diagnosis present

## 2022-10-13 DIAGNOSIS — Z79890 Hormone replacement therapy (postmenopausal): Secondary | ICD-10-CM

## 2022-10-13 DIAGNOSIS — R918 Other nonspecific abnormal finding of lung field: Secondary | ICD-10-CM | POA: Diagnosis not present

## 2022-10-13 DIAGNOSIS — Z8661 Personal history of infections of the central nervous system: Secondary | ICD-10-CM

## 2022-10-13 DIAGNOSIS — S0083XA Contusion of other part of head, initial encounter: Secondary | ICD-10-CM | POA: Diagnosis not present

## 2022-10-13 DIAGNOSIS — D352 Benign neoplasm of pituitary gland: Secondary | ICD-10-CM | POA: Diagnosis not present

## 2022-10-13 DIAGNOSIS — Z8261 Family history of arthritis: Secondary | ICD-10-CM

## 2022-10-13 DIAGNOSIS — G939 Disorder of brain, unspecified: Secondary | ICD-10-CM | POA: Diagnosis not present

## 2022-10-13 DIAGNOSIS — M25562 Pain in left knee: Secondary | ICD-10-CM | POA: Diagnosis not present

## 2022-10-13 DIAGNOSIS — D539 Nutritional anemia, unspecified: Secondary | ICD-10-CM | POA: Diagnosis present

## 2022-10-13 DIAGNOSIS — S0993XA Unspecified injury of face, initial encounter: Secondary | ICD-10-CM | POA: Diagnosis not present

## 2022-10-13 DIAGNOSIS — R9089 Other abnormal findings on diagnostic imaging of central nervous system: Secondary | ICD-10-CM | POA: Diagnosis not present

## 2022-10-13 DIAGNOSIS — Z7901 Long term (current) use of anticoagulants: Secondary | ICD-10-CM

## 2022-10-13 DIAGNOSIS — I671 Cerebral aneurysm, nonruptured: Secondary | ICD-10-CM | POA: Diagnosis not present

## 2022-10-13 DIAGNOSIS — I6523 Occlusion and stenosis of bilateral carotid arteries: Secondary | ICD-10-CM | POA: Diagnosis not present

## 2022-10-13 DIAGNOSIS — W19XXXA Unspecified fall, initial encounter: Secondary | ICD-10-CM

## 2022-10-13 DIAGNOSIS — S2242XA Multiple fractures of ribs, left side, initial encounter for closed fracture: Secondary | ICD-10-CM | POA: Diagnosis not present

## 2022-10-13 DIAGNOSIS — G4733 Obstructive sleep apnea (adult) (pediatric): Secondary | ICD-10-CM | POA: Diagnosis present

## 2022-10-13 DIAGNOSIS — R110 Nausea: Secondary | ICD-10-CM | POA: Diagnosis not present

## 2022-10-13 DIAGNOSIS — M150 Primary generalized (osteo)arthritis: Secondary | ICD-10-CM | POA: Diagnosis not present

## 2022-10-13 DIAGNOSIS — I890 Lymphedema, not elsewhere classified: Secondary | ICD-10-CM | POA: Diagnosis present

## 2022-10-13 DIAGNOSIS — S270XXA Traumatic pneumothorax, initial encounter: Secondary | ICD-10-CM | POA: Diagnosis not present

## 2022-10-13 DIAGNOSIS — Z888 Allergy status to other drugs, medicaments and biological substances status: Secondary | ICD-10-CM

## 2022-10-13 DIAGNOSIS — Z823 Family history of stroke: Secondary | ICD-10-CM

## 2022-10-13 LAB — COMPREHENSIVE METABOLIC PANEL
ALT: 15 U/L (ref 0–44)
AST: 21 U/L (ref 15–41)
Albumin: 3.2 g/dL — ABNORMAL LOW (ref 3.5–5.0)
Alkaline Phosphatase: 86 U/L (ref 38–126)
Anion gap: 11 (ref 5–15)
BUN: 24 mg/dL — ABNORMAL HIGH (ref 8–23)
CO2: 24 mmol/L (ref 22–32)
Calcium: 8.5 mg/dL — ABNORMAL LOW (ref 8.9–10.3)
Chloride: 97 mmol/L — ABNORMAL LOW (ref 98–111)
Creatinine, Ser: 1.07 mg/dL — ABNORMAL HIGH (ref 0.44–1.00)
GFR, Estimated: 48 mL/min — ABNORMAL LOW (ref >60)
Glucose, Bld: 110 mg/dL — ABNORMAL HIGH (ref 70–99)
Potassium: 3.1 mmol/L — ABNORMAL LOW (ref 3.5–5.1)
Sodium: 132 mmol/L — ABNORMAL LOW (ref 135–145)
Total Bilirubin: 1.1 mg/dL (ref 0.3–1.2)
Total Protein: 6.3 g/dL — ABNORMAL LOW (ref 6.5–8.1)

## 2022-10-13 LAB — I-STAT CHEM 8, ED
BUN: 25 mg/dL — ABNORMAL HIGH (ref 8–23)
Calcium, Ion: 1.08 mmol/L — ABNORMAL LOW (ref 1.15–1.40)
Chloride: 95 mmol/L — ABNORMAL LOW (ref 98–111)
Creatinine, Ser: 1.1 mg/dL — ABNORMAL HIGH (ref 0.44–1.00)
Glucose, Bld: 108 mg/dL — ABNORMAL HIGH (ref 70–99)
HCT: 31 % — ABNORMAL LOW (ref 36.0–46.0)
Hemoglobin: 10.5 g/dL — ABNORMAL LOW (ref 12.0–15.0)
Potassium: 3.1 mmol/L — ABNORMAL LOW (ref 3.5–5.1)
Sodium: 134 mmol/L — ABNORMAL LOW (ref 135–145)
TCO2: 26 mmol/L (ref 22–32)

## 2022-10-13 LAB — CBC
HCT: 31.4 % — ABNORMAL LOW (ref 36.0–46.0)
Hemoglobin: 9.7 g/dL — ABNORMAL LOW (ref 12.0–15.0)
MCH: 33.1 pg (ref 26.0–34.0)
MCHC: 30.9 g/dL (ref 30.0–36.0)
MCV: 107.2 fL — ABNORMAL HIGH (ref 80.0–100.0)
Platelets: 183 10*3/uL (ref 150–400)
RBC: 2.93 MIL/uL — ABNORMAL LOW (ref 3.87–5.11)
RDW: 18.9 % — ABNORMAL HIGH (ref 11.5–15.5)
WBC: 6.1 10*3/uL (ref 4.0–10.5)
nRBC: 0 % (ref 0.0–0.2)

## 2022-10-13 LAB — SAMPLE TO BLOOD BANK

## 2022-10-13 LAB — PROTIME-INR
INR: 1.9 — ABNORMAL HIGH (ref 0.8–1.2)
Prothrombin Time: 21.6 seconds — ABNORMAL HIGH (ref 11.4–15.2)

## 2022-10-13 MED ORDER — ATORVASTATIN CALCIUM 40 MG PO TABS
40.0000 mg | ORAL_TABLET | Freq: Every day | ORAL | Status: DC
  Administered 2022-10-14 – 2022-10-26 (×13): 40 mg via ORAL
  Filled 2022-10-13 (×13): qty 1

## 2022-10-13 MED ORDER — HYDRALAZINE HCL 20 MG/ML IJ SOLN: 10.0000 mg | INTRAMUSCULAR | Status: DC | PRN

## 2022-10-13 MED ORDER — METOPROLOL TARTRATE 5 MG/5ML IV SOLN
5.0000 mg | Freq: Four times a day (QID) | INTRAVENOUS | Status: DC | PRN
  Administered 2022-10-25: 5 mg via INTRAVENOUS
  Filled 2022-10-13: qty 5

## 2022-10-13 MED ORDER — METHOCARBAMOL 1000 MG/10ML IJ SOLN
500.0000 mg | Freq: Three times a day (TID) | INTRAVENOUS | Status: AC
  Filled 2022-10-13: qty 5

## 2022-10-13 MED ORDER — ONDANSETRON 4 MG PO TBDP: 4.0000 mg | ORAL_TABLET | Freq: Four times a day (QID) | ORAL | Status: DC | PRN

## 2022-10-13 MED ORDER — HYDROCODONE-ACETAMINOPHEN 5-325 MG PO TABS
1.0000 | ORAL_TABLET | Freq: Once | ORAL | Status: AC
  Administered 2022-10-13: 1 via ORAL
  Filled 2022-10-13: qty 1

## 2022-10-13 MED ORDER — OXYCODONE HCL 5 MG PO TABS
5.0000 mg | ORAL_TABLET | Freq: Four times a day (QID) | ORAL | Status: DC | PRN
  Administered 2022-10-14 – 2022-10-24 (×14): 5 mg via ORAL
  Filled 2022-10-13 (×16): qty 1

## 2022-10-13 MED ORDER — ASPIRIN 81 MG PO TBEC
81.0000 mg | DELAYED_RELEASE_TABLET | Freq: Every day | ORAL | Status: DC
  Administered 2022-10-14 – 2022-10-26 (×13): 81 mg via ORAL
  Filled 2022-10-13 (×13): qty 1

## 2022-10-13 MED ORDER — ACETAMINOPHEN 500 MG PO TABS
1000.0000 mg | ORAL_TABLET | Freq: Four times a day (QID) | ORAL | Status: DC
  Administered 2022-10-13 – 2022-10-14 (×3): 1000 mg via ORAL
  Administered 2022-10-14: 500 mg via ORAL
  Administered 2022-10-15 – 2022-10-26 (×35): 1000 mg via ORAL
  Filled 2022-10-13 (×44): qty 2

## 2022-10-13 MED ORDER — FUROSEMIDE 40 MG PO TABS
20.0000 mg | ORAL_TABLET | Freq: Every day | ORAL | Status: DC
  Administered 2022-10-15 – 2022-10-16 (×2): 20 mg via ORAL
  Filled 2022-10-13 (×2): qty 1

## 2022-10-13 MED ORDER — TRAZODONE HCL 50 MG PO TABS
25.0000 mg | ORAL_TABLET | Freq: Every evening | ORAL | Status: DC | PRN
  Administered 2022-10-14 – 2022-10-25 (×8): 25 mg via ORAL
  Filled 2022-10-13 (×11): qty 1

## 2022-10-13 MED ORDER — ONDANSETRON HCL 4 MG/2ML IJ SOLN
4.0000 mg | Freq: Four times a day (QID) | INTRAMUSCULAR | Status: DC | PRN
  Administered 2022-10-16 – 2022-10-26 (×3): 4 mg via INTRAVENOUS
  Filled 2022-10-13 (×3): qty 2

## 2022-10-13 MED ORDER — POTASSIUM CHLORIDE CRYS ER 10 MEQ PO TBCR: 10.0000 meq | EXTENDED_RELEASE_TABLET | Freq: Every day | ORAL | Status: DC

## 2022-10-13 MED ORDER — LEVOTHYROXINE SODIUM 100 MCG PO TABS
100.0000 ug | ORAL_TABLET | Freq: Every day | ORAL | Status: DC
  Administered 2022-10-14 – 2022-10-26 (×12): 100 ug via ORAL
  Filled 2022-10-13 (×12): qty 1

## 2022-10-13 MED ORDER — LIDOCAINE-EPINEPHRINE (PF) 2 %-1:200000 IJ SOLN
10.0000 mL | Freq: Once | INTRAMUSCULAR | Status: AC
  Administered 2022-10-13: 10 mL via INTRADERMAL
  Filled 2022-10-13: qty 20

## 2022-10-13 MED ORDER — LIDOCAINE 5 % EX PTCH
1.0000 | MEDICATED_PATCH | CUTANEOUS | Status: DC
  Administered 2022-10-13 – 2022-10-25 (×13): 1 via TRANSDERMAL
  Filled 2022-10-13 (×16): qty 1

## 2022-10-13 MED ORDER — FENTANYL CITRATE PF 50 MCG/ML IJ SOSY
50.0000 ug | PREFILLED_SYRINGE | Freq: Once | INTRAMUSCULAR | Status: AC
  Administered 2022-10-13: 50 ug via INTRAVENOUS
  Filled 2022-10-13: qty 1

## 2022-10-13 MED ORDER — METOPROLOL SUCCINATE ER 25 MG PO TB24
25.0000 mg | ORAL_TABLET | Freq: Two times a day (BID) | ORAL | Status: DC
  Administered 2022-10-13 – 2022-10-24 (×20): 25 mg via ORAL
  Filled 2022-10-13 (×29): qty 1

## 2022-10-13 MED ORDER — POLYETHYLENE GLYCOL 3350 17 G PO PACK
17.0000 g | PACK | Freq: Every day | ORAL | Status: DC | PRN
  Administered 2022-10-14: 17 g via ORAL
  Filled 2022-10-13: qty 1

## 2022-10-13 MED ORDER — POTASSIUM CHLORIDE 20 MEQ PO PACK
40.0000 meq | PACK | Freq: Two times a day (BID) | ORAL | Status: DC
  Administered 2022-10-13: 40 meq via ORAL
  Filled 2022-10-13: qty 2

## 2022-10-13 MED ORDER — IOHEXOL 350 MG/ML SOLN
75.0000 mL | Freq: Once | INTRAVENOUS | Status: AC | PRN
  Administered 2022-10-13: 75 mL via INTRAVENOUS

## 2022-10-13 MED ORDER — METHOCARBAMOL 500 MG PO TABS
500.0000 mg | ORAL_TABLET | Freq: Three times a day (TID) | ORAL | Status: AC
  Administered 2022-10-13 – 2022-10-16 (×9): 500 mg via ORAL
  Filled 2022-10-13 (×11): qty 1

## 2022-10-13 MED ORDER — HYDROMORPHONE HCL 1 MG/ML IJ SOLN
0.5000 mg | INTRAMUSCULAR | Status: DC | PRN
  Administered 2022-10-21 – 2022-10-24 (×3): 0.5 mg via INTRAVENOUS
  Filled 2022-10-13 (×3): qty 0.5

## 2022-10-13 MED ORDER — DOCUSATE SODIUM 100 MG PO CAPS
100.0000 mg | ORAL_CAPSULE | Freq: Two times a day (BID) | ORAL | Status: DC
  Administered 2022-10-13 – 2022-10-24 (×20): 100 mg via ORAL
  Filled 2022-10-13 (×24): qty 1

## 2022-10-13 MED ORDER — SENNA 8.6 MG PO TABS
1.0000 | ORAL_TABLET | Freq: Every day | ORAL | Status: DC | PRN
  Administered 2022-10-14: 8.6 mg via ORAL
  Filled 2022-10-13: qty 1

## 2022-10-13 MED ORDER — TETANUS-DIPHTH-ACELL PERTUSSIS 5-2.5-18.5 LF-MCG/0.5 IM SUSY
0.5000 mL | PREFILLED_SYRINGE | Freq: Once | INTRAMUSCULAR | Status: AC
  Administered 2022-10-13: 0.5 mL via INTRAMUSCULAR
  Filled 2022-10-13: qty 0.5

## 2022-10-13 NOTE — ED Notes (Addendum)
ED TO INPATIENT HANDOFF REPORT  ED Nurse Name and Phone #: Nolia Tschantz/ [phone]  S Name/Age/Gender Diana [name] 89 y.o. female Room/Bed: 025C/025C  Code Status   Code Status: Full Code  Home/SNF/Other Skilled nursing facility Patient oriented to: self, place, time, and situation Is this baseline? Yes   Triage Complete: Triage complete  Chief Complaint Rib fractures [S22.49XA]  Triage Note Pt BIB [facility] from [facility] in [place].   Pt BIB [facility] from [facility] in [place]. Pt fell in the bathroom while trying to pull her pants up. Pt normally walks with a walker. Pt stated she fell forward and then fell on her L side. Pt takes eliquis.   EMS VS BP 122/88 RR 22 O2 97% RA CBG 142   Allergies Allergies  Allergen Reactions   Meperidine Hcl Nausea And Vomiting    Level of Care/Admitting Diagnosis ED Disposition     ED Disposition  Admit   Condition  --   Comment  Hospital Area: [facility] [[id number]]  Level of Care: Progressive [102]  Admit to Progressive based on following criteria: CARDIOVASCULAR & THORACIC of moderate stability with acute coronary syndrome symptoms/low risk myocardial infarction/hypertensive urgency/arrhythmias/heart failure potentially compromising stability and stable post cardiovascular intervention patients.  May admit patient to [facility] or [facility] if equivalent level of care is available:: No  Covid Evaluation: Asymptomatic - no recent exposure (last 10 days) testing not required  Diagnosis: Rib fractures [[id number]]  Admitting Physician: TRAUMA MD [[id number]]  Attending Physician: TRAUMA MD [[id number]]  Bed request comments: 4 NP  Certification:: I certify this patient will need inpatient services for at least 2 midnights  Estimated Length of Stay: 4          B Medical/Surgery History Past Medical History:  Diagnosis Date   Arthritis    Bacterial pneumonia, unspecified     Cancer , breast    Candidiasis of mouth    Chronic airway obstruction, not elsewhere classified    son not aware of this   Encephalitis 1961   Family history of adverse reaction to anesthesia    son has nausea   Heart murmur    has had it for years; very mild AS 05/2015 echo   Hyposmolality and/or hyponatremia    Lumbago    Obstructive chronic bronchitis with exacerbation (HCC)    Other abnormal glucose    Pain in joint, shoulder region    Palpitations    Persistent disorder of initiating or maintaining sleep    Restless leg syndrome    Unspecified essential hypertension    Unspecified hypothyroidism    Unspecified viral infection, in conditions classified elsewhere and of unspecified site    Unspecified vitamin D deficiency    Urinary tract infection, site not specified    Past Surgical History:  Procedure Laterality Date   ABI  04/19/05   Negative   Back MRI  2005   mild bulging disks   EYE SURGERY Bilateral    Cataract surgery    HEMORRHOID SURGERY     MASTECTOMY, RADICAL     POSTERIOR CERVICAL LAMINECTOMY N/A 06/26/2015   Procedure: Cervical three-four POSTERIOR CERVICAL LAMINECTOMY for decompression ;  Surgeon: Loura Halt Ditty, MD;  Location: [facility];  Service: Neurosurgery;  Laterality: N/A;  C34 laminectomy   Venous doppler  04/19/05   LE normal     A IV Location/Drains/Wounds Patient Lines/Drains/Airways Status     Active Line/Drains/Airways  Name Placement date Placement time Site Days   Peripheral IV 10/13/22 20 G Left Antecubital 10/13/22  1641  Antecubital  less than 1   Wound / Incision (Open or Dehisced) 09/03/22 Pretibial Left 09/03/22  2300  Pretibial  40   Wound / Incision (Open or Dehisced) 09/03/22 Tibial Posterior;Right 09/03/22  2300  Tibial  40            Intake/Output Last 24 hours No intake or output data in the 24 hours ending 10/13/22 1954  Labs/Imaging Results for orders placed or performed during the hospital encounter of  10/13/22 (from the past 48 hour(s))  Comprehensive metabolic panel     Status: Abnormal   Collection Time: 10/13/22  4:11 PM  Result Value Ref Range   Sodium 132 (L) 135 - 145 mmol/L   Potassium 3.1 (L) 3.5 - 5.1 mmol/L   Chloride 97 (L) 98 - 111 mmol/L   CO2 24 22 - 32 mmol/L   Glucose, Bld 110 (H) 70 - 99 mg/dL    Comment: Glucose reference range applies only to samples taken after fasting for at least 8 hours.   BUN 24 (H) 8 - 23 mg/dL   Creatinine, Ser 4.09 (H) 0.44 - 1.00 mg/dL   Calcium 8.5 (L) 8.9 - 10.3 mg/dL   Total Protein 6.3 (L) 6.5 - 8.1 g/dL   Albumin 3.2 (L) 3.5 - 5.0 g/dL   AST 21 15 - 41 U/L   ALT 15 0 - 44 U/L   Alkaline Phosphatase 86 38 - 126 U/L   Total Bilirubin 1.1 0.3 - 1.2 mg/dL   GFR, Estimated 48 (L) >60 mL/min    Comment: (NOTE) Calculated using the CKD-EPI Creatinine Equation (2021)    Anion gap 11 5 - 15    Comment: Performed at [facility], [address], [place], [place] [address]  CBC     Status: Abnormal   Collection Time: 10/13/22  4:11 PM  Result Value Ref Range   WBC 6.1 4.0 - 10.5 K/uL   RBC 2.93 (L) 3.87 - 5.11 MIL/uL   Hemoglobin 9.7 (L) 12.0 - 15.0 g/dL   HCT 47.8 (L) 29.5 - 62.1 %   MCV 107.2 (H) 80.0 - 100.0 fL   MCH 33.1 26.0 - 34.0 pg   MCHC 30.9 30.0 - 36.0 g/dL   RDW 30.8 (H) 65.7 - 84.6 %   Platelets 183 150 - 400 K/uL   nRBC 0.0 0.0 - 0.2 %    Comment: Performed at [facility], [address], [place], [place] [address]  Protime-INR     Status: Abnormal   Collection Time: 10/13/22  4:11 PM  Result Value Ref Range   Prothrombin Time 21.6 (H) 11.4 - 15.2 seconds   INR 1.9 (H) 0.8 - 1.2    Comment: (NOTE) INR goal varies based on device and disease states. Performed at [facility], [address] [place], [place] [address]   Sample to Blood Bank     Status: None   Collection Time: 10/13/22  4:11 PM  Result Value Ref Range   Blood Bank Specimen SAMPLE AVAILABLE FOR TESTING    Sample Expiration       10/16/2022,2359 Performed at [facility], [address], [place], [place] [address]   I-Stat Chem 8, ED     Status: Abnormal   Collection Time: 10/13/22  4:14 PM  Result Value Ref Range   Sodium 134 (L)  135 - 145 mmol/L   Potassium 3.1 (L) 3.5 - 5.1 mmol/L   Chloride 95 (L) 98 - 111 mmol/L   BUN 25 (H) 8 - 23 mg/dL   Creatinine, Ser 1.61 (H) 0.44 - 1.00 mg/dL   Glucose, Bld 096 (H) 70 - 99 mg/dL    Comment: Glucose reference range applies only to samples taken after fasting for at least 8 hours.   Calcium, Ion 1.08 (L) 1.15 - 1.40 mmol/L   TCO2 26 22 - 32 mmol/L   Hemoglobin 10.5 (L) 12.0 - 15.0 g/dL   HCT 04.5 (L) 40.9 - 81.1 %   CT CHEST ABDOMEN PELVIS W CONTRAST  Result Date: 10/13/2022 CLINICAL DATA:  Pain after fall.  On Eliquis EXAM: CT CHEST, ABDOMEN, AND PELVIS WITH CONTRAST TECHNIQUE: Multidetector CT imaging of the chest, abdomen and pelvis was performed following the standard protocol during bolus administration of intravenous contrast. RADIATION DOSE REDUCTION: This exam was performed according to the departmental dose-optimization program which includes automated exposure control, adjustment of the mA and/or kV according to patient size and/or use of iterative reconstruction technique. CONTRAST:  75mL OMNIPAQUE IOHEXOL 350 MG/ML SOLN COMPARISON:  None Available. FINDINGS: CT CHEST FINDINGS Cardiovascular: Heart is enlarged particularly the right atrium. No pericardial effusion. There are calcifications in the area of the aortic valve. The thoracic aorta overall has a normal course and caliber with some scattered vascular calcifications. Coronary artery calcifications are seen. Is also significant calcifications along the great vessels. No mediastinal hematoma. Mediastinum/Nodes: No specific abnormal lymph node enlargement identified in the axillary regions, hilum or mediastinum. Normal caliber thoracic esophagus. Heterogeneous thyroid. Lungs/Pleura: There is a small right  pleural effusion. Trace left. Breathing motion seen throughout the examination. There are some areas of bandlike changes along the lung bases likely scar or atelectasis. There are some ill-defined areas of ground-glass in both lungs as well. Please correlate for any evidence of air trapping. Mild bronchial wall thickening identified in several locations. No pneumothorax. Musculoskeletal: Scattered degenerative changes along the spine. Degenerative changes of the shoulders. Fractures involving the anterior aspect of the left second, third, fourth, fifth ribs. Based on appearance these are age-indeterminate. Please correlate to point tenderness. CT ABDOMEN PELVIS FINDINGS Hepatobiliary: Contrast is in the late arterial phase. Grossly preserved hepatic parenchyma. Dependent stone in the nondilated gallbladder. Patent portal vein. Pancreas: Unremarkable. No pancreatic ductal dilatation or surrounding inflammatory changes. Spleen: Normal in size without focal abnormality. Adrenals/Urinary Tract: Slight thickening of the adrenal glands, nonspecific. Malrotated right kidney. Mild bilateral renal atrophy. No enhancing renal mass or collecting system dilatation. Distended urinary bladder. Stomach/Bowel: No oral contrast. Significant stool in the rectum. Scattered colonic diverticula. No large bowel or small bowel dilatation. Stomach is relatively collapsed. Small bowel is nondilated. No free air or free fluid. Vascular/Lymphatic: Aortic atherosclerosis. No enlarged abdominal or pelvic lymph nodes. Reproductive: Uterus and bilateral adnexa are unremarkable. Other: Nonspecific presacral fat stranding. Diffuse anasarca identified as well. No free intra-air or free fluid. Musculoskeletal: Left-sided rectus muscle hematoma identified. This extends cephalocaudal for proximally 9 cm. Transverse dimensions of 9.3 by 4.4 cm. No active extravasation. Curvature and degenerative changes along the spine. Degenerative changes of the  pelvis. IMPRESSION: Left-sided rectus muscle hematoma measuring up to 9.3 cm. No active extravasation. Multiple left-sided anterior possibly acute rib fractures. No underlying pneumothorax. Small right pleural effusion.  Tiny left Enlarged heart. Colonic diverticulosis. Significant stool in the rectum. No bowel obstruction, free air or fluid collection. Gallstone. Anasarca.  Nonspecific  stranding in the presacral pelvis. Electronically Signed   By: Karen Kays M.D.   On: 10/13/2022 17:17   CT Head Wo Contrast  Result Date: 10/13/2022 CLINICAL DATA:  Head trauma, minor (Age >= 65y); Facial trauma, blunt; Neck trauma (Age >= 65y) EXAM: CT HEAD WITHOUT CONTRAST CT MAXILLOFACIAL WITHOUT CONTRAST CT CERVICAL SPINE WITHOUT CONTRAST TECHNIQUE: Multidetector CT imaging of the head, cervical spine, and maxillofacial structures were performed using the standard protocol without intravenous contrast. Multiplanar CT image reconstructions of the cervical spine and maxillofacial structures were also generated. RADIATION DOSE REDUCTION: This exam was performed according to the departmental dose-optimization program which includes automated exposure control, adjustment of the mA and/or kV according to patient size and/or use of iterative reconstruction technique. COMPARISON:  None Available. FINDINGS: CT HEAD FINDINGS Brain: No evidence of acute infarction, hemorrhage, hydrocephalus, extra-axial collection. There is a 0.0 x 1.9 x 2.2 cm suprasellar mass with mass effect on the optic chiasm. 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 Sequela of moderate chronic microvascular ischemic change. Ventriculomegaly likely secondary to generalized volume loss. Vascular: No hyperdense vessel or unexpected calcification. Skull: Soft tissue hematoma along the left lateral frontal scalp. No  evidence of underlying calvarial fracture. Other: None. CT MAXILLOFACIAL FINDINGS Osseous: No fracture or mandibular dislocation. No destructive process. Orbits: Negative. No traumatic or inflammatory finding. Sinuses: No middle ear or mastoid effusion. Paranasal sinuses are clear. Bilateral lens replacement. Orbits are otherwise unremarkable. Soft tissues: Soft tissue injury to the posterior auricular soft tissues on the left. CT CERVICAL SPINE FINDINGS Alignment: Straightening of the normal cervical lordosis. Grade 1 anterolisthesis of C3 on C4 and C4 on C5. Skull base and vertebrae: No acute fracture. No primary bone lesion or focal pathologic process. Soft tissues and spinal canal: No prevertebral fluid or swelling. No visible canal hematoma. Disc levels:  No evidence of high-grade spinal canal stenosis. Upper chest: Negative. Other: None IMPRESSION: 1. Soft tissue hematoma along the left lateral frontal scalp. No evidence of underlying calvarial fracture or intracranial injury. 2. No acute facial bone fracture. 3. No acute cervical spine fracture. 4. There is a 0.0 x 1.9 x 2.2 cm suprasellar mass with mass effect on the optic chiasm. 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 Electronically Signed   By: Lorenza Cambridge M.D.   On: 10/13/2022 17:11   CT Cervical Spine Wo Contrast  Result Date: 10/13/2022 CLINICAL DATA:  Head trauma, minor (Age >= 65y); Facial trauma, blunt; Neck trauma (Age >= 65y) EXAM: CT HEAD WITHOUT CONTRAST CT MAXILLOFACIAL WITHOUT CONTRAST CT CERVICAL SPINE WITHOUT CONTRAST TECHNIQUE: Multidetector CT imaging of the head, cervical spine, and maxillofacial structures were performed using the standard protocol without intravenous contrast. Multiplanar CT image reconstructions of the cervical spine and maxillofacial structures were also generated.  RADIATION DOSE REDUCTION: This exam was performed according to the departmental dose-optimization program which includes automated exposure control, adjustment of the mA and/or kV according to patient size and/or use of iterative reconstruction technique. COMPARISON:  None Available. FINDINGS: CT HEAD FINDINGS Brain: No evidence of acute infarction, hemorrhage, hydrocephalus, extra-axial collection. There is a 0.0 x 1.9 x 2.2 cm suprasellar mass with mass effect on the optic  chiasm. 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 Sequela of moderate chronic microvascular ischemic change. Ventriculomegaly likely secondary to generalized volume loss. Vascular: No hyperdense vessel or unexpected calcification. Skull: Soft tissue hematoma along the left lateral frontal scalp. No evidence of underlying calvarial fracture. Other: None. CT MAXILLOFACIAL FINDINGS Osseous: No fracture or mandibular dislocation. No destructive process. Orbits: Negative. No traumatic or inflammatory finding. Sinuses: No middle ear or mastoid effusion. Paranasal sinuses are clear. Bilateral lens replacement. Orbits are otherwise unremarkable. Soft tissues: Soft tissue injury to the posterior auricular soft tissues on the left. CT CERVICAL SPINE FINDINGS Alignment: Straightening of the normal cervical lordosis. Grade 1 anterolisthesis of C3 on C4 and C4 on C5. Skull base and vertebrae: No acute fracture. No primary bone lesion or focal pathologic process. Soft tissues and spinal canal: No prevertebral fluid or swelling. No visible canal hematoma. Disc levels:  No evidence of high-grade spinal canal stenosis. Upper chest: Negative. Other: None IMPRESSION: 1. Soft tissue hematoma along the left lateral frontal scalp. No evidence of underlying calvarial fracture or intracranial injury. 2. No acute facial bone  fracture. 3. No acute cervical spine fracture. 4. There is a 0.0 x 1.9 x 2.2 cm suprasellar mass with mass effect on the optic chiasm. 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 Electronically Signed   By: Lorenza Cambridge M.D.   On: 10/13/2022 17:11   CT Maxillofacial Wo Contrast  Result Date: 10/13/2022 CLINICAL DATA:  Head trauma, minor (Age >= 65y); Facial trauma, blunt; Neck trauma (Age >= 65y) EXAM: CT HEAD WITHOUT CONTRAST CT MAXILLOFACIAL WITHOUT CONTRAST CT CERVICAL SPINE WITHOUT CONTRAST TECHNIQUE: Multidetector CT imaging of the head, cervical spine, and maxillofacial structures were performed using the standard protocol without intravenous contrast. Multiplanar CT image reconstructions of the cervical spine and maxillofacial structures were also generated. RADIATION DOSE REDUCTION: This exam was performed according to the departmental dose-optimization program which includes automated exposure control, adjustment of the mA and/or kV according to patient size and/or use of iterative reconstruction technique. COMPARISON:  None Available. FINDINGS: CT HEAD FINDINGS Brain: No evidence of acute infarction, hemorrhage, hydrocephalus, extra-axial collection. There is a 0.0 x 1.9 x 2.2 cm suprasellar mass with mass effect on the optic chiasm. 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 Sequela of moderate chronic microvascular ischemic change. Ventriculomegaly likely secondary to generalized volume loss. Vascular: No hyperdense vessel or unexpected calcification. Skull: Soft tissue hematoma along the left lateral frontal scalp. No evidence of underlying calvarial fracture. Other: None. CT MAXILLOFACIAL FINDINGS Osseous: No  fracture or mandibular dislocation. No destructive process. Orbits: Negative. No traumatic or inflammatory finding. Sinuses: No middle ear or mastoid effusion. Paranasal sinuses are clear. Bilateral lens replacement. Orbits are otherwise unremarkable. Soft tissues: Soft tissue injury to the posterior auricular soft tissues on the left. CT CERVICAL SPINE FINDINGS Alignment: Straightening of the normal cervical lordosis. Grade 1 anterolisthesis of C3 on C4 and C4 on C5. Skull base and vertebrae: No acute fracture. No primary bone lesion or focal pathologic process. Soft tissues and  spinal canal: No prevertebral fluid or swelling. No visible canal hematoma. Disc levels:  No evidence of high-grade spinal canal stenosis. Upper chest: Negative. Other: None IMPRESSION: 1. Soft tissue hematoma along the left lateral frontal scalp. No evidence of underlying calvarial fracture or intracranial injury. 2. No acute facial bone fracture. 3. No acute cervical spine fracture. 4. There is a 0.0 x 1.9 x 2.2 cm suprasellar mass with mass effect on the optic chiasm. 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 Electronically Signed   By: Lorenza Cambridge M.D.   On: 10/13/2022 17:11   DG Pelvis Portable  Result Date: 10/13/2022 CLINICAL DATA:  Fall. EXAM: PORTABLE PELVIS 1-2 VIEWS COMPARISON:  None Available. FINDINGS: There is no evidence of pelvic fracture or diastasis. No pelvic bone lesions are seen. IMPRESSION: Negative. Electronically Signed   By: Lupita Raider M.D.   On: 10/13/2022 16:37   DG Shoulder Left Portable  Result Date: 10/13/2022 CLINICAL DATA:  Fall. EXAM: LEFT SHOULDER COMPARISON:  October 17, 2013. FINDINGS: No definite fracture is noted. Old left rib fractures are noted. There does appear to be some elevation of the distal end of the left clavicle relative to acromion  which was present on prior exam suggesting acromioclavicular joint separation. IMPRESSION: Stable elevation of distal end of left clavicle relative to the acromion suggesting stable old acromioclavicular joint separation. No acute fracture or dislocation is noted. Electronically Signed   By: Lupita Raider M.D.   On: 10/13/2022 16:37   DG Elbow 2 Views Left  Result Date: 10/13/2022 CLINICAL DATA:  Fall. EXAM: LEFT ELBOW - 2 VIEW COMPARISON:  None Available. FINDINGS: There is no evidence of fracture, dislocation, or joint effusion. There is no evidence of arthropathy or other focal bone abnormality. Soft tissues are unremarkable. IMPRESSION: Negative. Electronically Signed   By: Lupita Raider M.D.   On: 10/13/2022 16:35   DG Knee 2 Views Left  Result Date: 10/13/2022 CLINICAL DATA:  Left knee pain after fall. EXAM: LEFT KNEE - 1-2 VIEW COMPARISON:  None Available. FINDINGS: No evidence of fracture, dislocation, or joint effusion. No evidence of arthropathy or other focal bone abnormality. Vascular calcifications are noted. IMPRESSION: No significant abnormality seen in the left knee. Electronically Signed   By: Lupita Raider M.D.   On: 10/13/2022 16:33   DG Hip Unilat W or Wo Pelvis 2-3 Views Left  Result Date: 10/13/2022 CLINICAL DATA:  Status post fall.  Rule out fracture. EXAM: DG HIP (WITH OR WITHOUT PELVIS) 2-3V LEFT COMPARISON:  None Available. FINDINGS: There is no evidence of hip fracture or dislocation. There is cortical irregularity of the superior pubic ramus on the left, obscured by formed stool in the rectum. IMPRESSION: 1. No evidence of hip fracture or dislocation. 2. Cortical irregularity of the superior pubic ramus on the left, obscured by formed stool in the rectum. Consider further evaluation with dedicated pelvic radiograph or CT. Electronically Signed   By: Ted Mcalpine M.D.   On: 10/13/2022 16:32   DG Chest Port 1 View  Result Date: 10/13/2022 CLINICAL DATA:  Trauma.  EXAM: PORTABLE CHEST 1 VIEW COMPARISON:  Sep 07, 2022 FINDINGS: Cardiomediastinal silhouette is enlarged. Mediastinal contours appear intact. Possible interstitial pulmonary edema with small bilateral pleural effusions. Osseous structures are without acute abnormality. Soft tissues are grossly normal. IMPRESSION: Possible  interstitial pulmonary edema with small bilateral pleural effusions. Electronically Signed   By: Ted Mcalpine M.D.   On: 10/13/2022 16:30    Pending Labs Unresulted Labs (From admission, onward)     Start     Ordered   10/13/22 1603  Lactic acid, plasma  (Trauma Panel)  Once,   STAT        10/13/22 1602   Signed and Held  CBC  Tomorrow morning,   R        Signed and Held   Signed and Held  Basic metabolic panel  Tomorrow morning,   R        Signed and Held            Vitals/Pain Today's Vitals   10/13/22 1601 10/13/22 1632 10/13/22 1704 10/13/22 1915  BP: 122/78   129/84  Pulse: (!) 101   60  Resp:    10  Temp: (!) 97.5 F (36.4 C)     TempSrc: Oral     SpO2: 97%  93% 99%  Weight:  74.4 kg    Height:  5\' 4"  (1.626 m)      Isolation Precautions No active isolations  Medications Medications  fentaNYL (SUBLIMAZE) injection 50 mcg (50 mcg Intravenous Given 10/13/22 1708)  Tdap (BOOSTRIX) injection 0.5 mL (0.5 mLs Intramuscular Given 10/13/22 1710)  iohexol (OMNIPAQUE) 350 MG/ML injection 75 mL (75 mLs Intravenous Contrast Given 10/13/22 1657)  lidocaine-EPINEPHrine (XYLOCAINE W/EPI) 2 %-1:200000 (PF) injection 10 mL (10 mLs Intradermal Given 10/13/22 1736)  HYDROcodone-acetaminophen (NORCO/VICODIN) 5-325 MG per tablet 1 tablet (1 tablet Oral Given 10/13/22 1735)    Mobility walks with device     Focused Assessments    R Recommendations: See Admitting Provider Note  Report given to:   Additional Notes:  Pt came in for a fall. She normally walks with a walker but fell forward in the bathroom after trying to pull her pants up. She has 20 G in L AC.  So far she's gotten fentanyl for the pain. Son is at bedside.

## 2022-10-13 NOTE — TOC CAGE-AID Note (Signed)
Transition of Care [facility]) - CAGE-AID Screening   Patient Details  Name: Diana [name] MRN: [id number] Date of Birth: [birth year]  Transition of Care [facility]) CM/SW Contact:    Leota Sauers, RN Phone Number: 10/13/2022, 9:09 PM   Clinical Narrative:  Patient denies the use of alcohol and illicit drugs. Education not offered at this time.  CAGE-AID Screening:    Have You Ever Felt You Ought to Cut Down on Your Drinking or Drug Use?: No Have People Annoyed You By Critizing Your Drinking Or Drug Use?: No Have You Felt Bad Or Guilty About Your Drinking Or Drug Use?: No Have You Ever Had a Drink or Used Drugs First Thing In The Morning to Steady Your Nerves or to Get Rid of a Hangover?: No CAGE-AID Score: 0  Substance Abuse Education Offered: No

## 2022-10-13 NOTE — ED Triage Notes (Signed)
Pt BIB [facility] from [facility] in [place].

## 2022-10-13 NOTE — Consult Note (Signed)
Initial Consultation Note   Patient: Diana [name]:[id number] DOB: 1923/10/09 PCP: Excell Seltzer, MD DOA: 10/13/2022 DOS: the patient was seen and examined on 10/13/2022 Primary service: Md, Trauma, MD  Referring physician: kommer Reason for consult: med management  Assessment/Plan: Assessment and Plan: Diana [name] a 89 year old who presented after mechanical fall.  Workup in the emergency department revealed rib fractures as well as scalp hematoma.  Patient was admitted to the trauma service for further workup.  Upon discussion with family she [name] unable to verbalize her pain and family requesting scheduled medications  # Fall # Left scalp hematoma # Left-sided rib fractures -Management per trauma team - Recommend lidocaine patch to left-sided rib fractures - Recommend therapy evaluation with possible placement  # CAD-continue aspirin  # Concern for cranial aneurysm-patient will need CTA head and neck  # Hyperlipidemia-continue Lipitor  # History of heart failure-continue Lasix, metoprolol. TTE in May showed EF 25%. Recommend cautious volume resuscation and low threshold for IV lasix.  # Hypothyroidism-continue levothyroxine  # Insomnia-continue trazodone  # Hyponatremia likely related to hypervolemia- continue lasix  # Hypokalemia-replete with p.o.  TRH will continue to follow the patient.  HPI: Diana [name] a 89 y.o. female with past medical history of breast cancer, chronic bronchitis, RLS, hypothyroidism, hypertension who presented to the emergency department after mechanical fall patient reportedly fell on her left side while walking.  She had notable bruising on the left side of her face as well as pain in her shoulder and hip.  She presented to the emergency department where she was found to be afebrile hemodynamically stable.  Labs were taken which were revealed sodium 132, potassium 3.1, creatinine 1.07, hemoglobin 9.7, WBC 6.1, platelets 183, INR 1.9.   Patient underwent chest x-ray which showed possible pulmonary edema and small effusions.  X-ray pelvis demonstrated no fracture.  X-ray of shoulder showed no acute fractures.  X-ray of elbow showed no fracture.  X-ray of knee showed no abnormalities.  X-ray of hips showed irregularity in pubic ramus.  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  CT chest abdomen pelvis demonstrated left-sided rectus hematoma, multiple left-sided rib fractures and significant stool in rectum.  Patient was admitted to the trauma service and hospitalist were consulted for medication management.  On arrival she was alert and oriented denied complaints.  Review of Systems: As mentioned in the history of present illness. All other systems reviewed and are negative. Past Medical History:  Diagnosis Date   Arthritis    Bacterial pneumonia, unspecified    Cancer , breast    Candidiasis of mouth    Chronic airway obstruction, not elsewhere classified    son not aware of this   Encephalitis 1961   Family history of adverse reaction to anesthesia    son has nausea   Heart murmur    has had it for years; very mild AS 05/2015 echo   Hyposmolality and/or hyponatremia    Lumbago    Obstructive chronic bronchitis with exacerbation (HCC)    Other abnormal glucose    Pain in joint, shoulder region    Palpitations    Persistent disorder of initiating or maintaining sleep    Restless leg syndrome    Unspecified essential hypertension    Unspecified hypothyroidism    Unspecified viral infection,  in conditions classified elsewhere and of unspecified site    Unspecified vitamin D deficiency    Urinary tract infection, site not specified    Past Surgical History:  Procedure Laterality Date   ABI  04/19/05   Negative   Back MRI  2005   mild  bulging disks   EYE SURGERY Bilateral    Cataract surgery    HEMORRHOID SURGERY     MASTECTOMY, RADICAL     POSTERIOR CERVICAL LAMINECTOMY N/A 06/26/2015   Procedure: Cervical three-four POSTERIOR CERVICAL LAMINECTOMY for decompression ;  Surgeon: Loura Halt Ditty, MD;  Location: [facility];  Service: Neurosurgery;  Laterality: N/A;  C34 laminectomy   Venous doppler  04/19/05   LE normal   Social History:  reports that she has quit smoking. Her smoking use included cigarettes. She has a 21.00 pack-year smoking history. She has never used smokeless tobacco. She reports that she does not drink alcohol and does not use drugs.  Allergies  Allergen Reactions   Meperidine Hcl Nausea And Vomiting    Family History  Problem Relation Age of Onset   Rheum arthritis Father    Stroke Mother    Diabetes Mother    Healthy Sister    Healthy Sister    Liver cancer Unknown        Aunt   Stomach cancer Unknown        Aunt    Prior to Admission medications   Medication Sig Start Date End Date Taking? Authorizing Provider  apixaban (ELIQUIS) 5 MG TABS tablet Take 1 tablet (5 mg total) by mouth 2 (two) times daily. 09/10/22   Leeroy Bock, MD  aspirin EC 81 MG tablet Take 1 tablet (81 mg total) by mouth daily. Swallow whole. 09/11/22   Leeroy Bock, MD  atorvastatin (LIPITOR) 40 MG tablet Take 1 tablet (40 mg total) by mouth daily. 09/11/22   Leeroy Bock, MD  furosemide (LASIX) 40 MG tablet Take 1 tablet (40 mg total) by mouth daily. 09/11/22   Leeroy Bock, MD  levothyroxine (SYNTHROID) 100 MCG tablet Take 1 tablet (100 mcg total) by mouth daily. 08/18/22   Bedsole, Amy E, MD  metoprolol succinate (TOPROL-XL) 25 MG 24 hr tablet Take 1 tablet (25 mg total) by mouth 2 (two) times daily. 09/10/22   Leeroy Bock, MD  potassium chloride (KLOR-CON M) 10 MEQ tablet Take 1 tablet (10 mEq total) by mouth daily. 09/11/22   Antonieta Iba, MD  senna (SENOKOT) 8.6 MG TABS  tablet Take 1 tablet by mouth daily as needed for mild constipation.    [provider]  traZODone (DESYREL) 50 MG tablet TAKE 1/2 TO 1 TABLET BY MOUTH AT [facility] NEEDED FOR SLEEP 04/06/22   Excell Seltzer, MD    Physical Exam: Vitals:   10/13/22 1632 10/13/22 1704 10/13/22 1915 10/13/22 2110  BP:   129/84   Pulse:   60 97  Resp:   10   Temp:    97.9 F (36.6 C)  TempSrc:    Oral  SpO2:  93% 99% 98%  Weight: 74.4 kg     Height: 5\' 4"  (1.626 m)      Physical Exam Constitutional:      Appearance: She [name] normal weight.  HENT:     Head:     Comments: Trauma/hematoma on left side of face Eyes:     Conjunctiva/sclera: Conjunctivae normal.     Pupils: Pupils are equal, round, and  reactive to light.  Cardiovascular:     Rate and Rhythm: Normal rate.     Pulses: Normal pulses.     Heart sounds: Normal heart sounds.  Pulmonary:     Effort: Pulmonary effort [name] normal.     Breath sounds: Normal breath sounds.  Abdominal:     General: Abdomen [name] flat. Bowel sounds are normal.  Musculoskeletal:        General: Normal range of motion.     Cervical back: Normal range of motion.  Skin:    General: Skin [name] warm.     Capillary Refill: Capillary refill takes less than 2 seconds.  Neurological:     General: No focal deficit present.     Mental Status: She [name] alert. Mental status [name] at baseline. She [name] disoriented.  Psychiatric:        Mood and Affect: Mood normal.      Thank you very much for involving [place] in the care of your patient.  Author: Alan Mulder, MD 10/13/2022 10:02 PM  For on call review [email or web address].

## 2022-10-13 NOTE — ED Notes (Signed)
Trauma Response Nurse Documentation   GLADY [name] is a 89 y.o. female arriving to [facility] ED via EMS  On Eliquis (apixaban) daily. Trauma was activated as a Level 2 by ED Charge RN based on the following trauma criteria Elderly patients > 65 with head trauma on anti-coagulation (excluding ASA).  Patient cleared for CT by Dr. Posey Rea. Pt transported to CT with trauma response nurse present to monitor. RN remained with the patient throughout their absence from the department for clinical observation.   GCS 14.  History   Past Medical History:  Diagnosis Date   Arthritis    Bacterial pneumonia, unspecified    Cancer , breast    Candidiasis of mouth    Chronic airway obstruction, not elsewhere classified    son not aware of this   Encephalitis 1961   Family history of adverse reaction to anesthesia    son has nausea   Heart murmur    has had it for years; very mild AS 05/2015 echo   Hyposmolality and/or hyponatremia    Lumbago    Obstructive chronic bronchitis with exacerbation (HCC)    Other abnormal glucose    Pain in joint, shoulder region    Palpitations    Persistent disorder of initiating or maintaining sleep    Restless leg syndrome    Unspecified essential hypertension    Unspecified hypothyroidism    Unspecified viral infection, in conditions classified elsewhere and of unspecified site    Unspecified vitamin D deficiency    Urinary tract infection, site not specified      Past Surgical History:  Procedure Laterality Date   ABI  04/19/05   Negative   Back MRI  2005   mild bulging disks   EYE SURGERY Bilateral    Cataract surgery    HEMORRHOID SURGERY     MASTECTOMY, RADICAL     POSTERIOR CERVICAL LAMINECTOMY N/A 06/26/2015   Procedure: Cervical three-four POSTERIOR CERVICAL LAMINECTOMY for decompression ;  Surgeon: Loura Halt Ditty, MD;  Location: [facility];  Service: Neurosurgery;  Laterality: N/A;  C34 laminectomy   Venous doppler  04/19/05   LE normal      Initial Focused Assessment (If applicable, or please see trauma documentation): - Airway intact  - Breath sounds equal, clear bilaterally - Multiple lacs and bruises to face, head, L shoulder etc - Bruising to L knee - 20G PIV to L AC - C-collar in place  CT's Completed:   CT Head, CT Maxillofacial, CT C-Spine, CT Chest w/ contrast, and CT abdomen/pelvis w/ contrast   Interventions:  - CT pan scan - CXR - Pelvic XR - L shoulder XR - L elbow XR - Left knee XR - Left hip XR - trauma labs   Plan for disposition:  Admission to floor   Consults completed:  Trauma surgeon @ 1725.  Event Summary: Pt was BIB [facility] from [facility] in [place].  Pt had a fall in the bathroom while trying to pull her pants up; she states she fell forward and then onto her left side. No LOC.  Pt walks with walker normally. On Eliquis. Very HOH.  Bedside handoff with ED RN Joya.    Diana [name]  Trauma Response RN  Please call TRN at [phone] for further assistance.

## 2022-10-13 NOTE — Progress Notes (Signed)
   10/13/22 1600  Spiritual Encounters  Type of Visit Initial  Care provided to: Patient  Conversation partners present during encounter Nurse  Referral source Trauma page  Reason for visit Trauma  OnCall Visit No   Ch responded to trauma page. There was no family at bedside. Ch provided emotional support for pt. No follow-up needed at this time.

## 2022-10-13 NOTE — H&P (Signed)
CC: Ground level fall, rib fractures  HPI: Diana [name] is an 89 y.o. female with hx of chronic hearing loss, progressive bilateral lower extremity weakness, atrial fibrillation, essential hypertension -recently discharged from [facility] 09/06/2022 with cellulitis of the lower extremity.  She was discharged to a rehab facility where she was for a few weeks.  She subsequently was transferred to another facility today where she experienced a fall by her son's report.  Arrived complaining of left-sided chest pain  Underwent workup and we are asked to see.   She complains of soreness in her left chest wall, brought on by deep inspiration.  She is nonambulatory after the fall and was transported directly here by EMS. Denies pain in her head, neck, back, abdomen/pelvis, or any extremity.   Past Medical History:  Diagnosis Date   Arthritis    Bacterial pneumonia, unspecified    Cancer , breast    Candidiasis of mouth    Chronic airway obstruction, not elsewhere classified    son not aware of this   Encephalitis 1961   Family history of adverse reaction to anesthesia    son has nausea   Heart murmur    has had it for years; very mild AS 05/2015 echo   Hyposmolality and/or hyponatremia    Lumbago    Obstructive chronic bronchitis with exacerbation (HCC)    Other abnormal glucose    Pain in joint, shoulder region    Palpitations    Persistent disorder of initiating or maintaining sleep    Restless leg syndrome    Unspecified essential hypertension    Unspecified hypothyroidism    Unspecified viral infection, in conditions classified elsewhere and of unspecified site    Unspecified vitamin D deficiency    Urinary tract infection, site not specified     Past Surgical History:  Procedure Laterality Date   ABI  04/19/05   Negative   Back MRI  2005   mild bulging disks   EYE SURGERY Bilateral    Cataract surgery    HEMORRHOID SURGERY     MASTECTOMY, RADICAL     POSTERIOR CERVICAL  LAMINECTOMY N/A 06/26/2015   Procedure: Cervical three-four POSTERIOR CERVICAL LAMINECTOMY for decompression ;  Surgeon: Loura Halt Ditty, MD;  Location: [facility];  Service: Neurosurgery;  Laterality: N/A;  C34 laminectomy   Venous doppler  04/19/05   LE normal    Family History  Problem Relation Age of Onset   Rheum arthritis Father    Stroke Mother    Diabetes Mother    Healthy Sister    Healthy Sister    Liver cancer Unknown        Aunt   Stomach cancer Unknown        Aunt    Social:  reports that she has quit smoking. Her smoking use included cigarettes. She has a 21.00 pack-year smoking history. She has never used smokeless tobacco. She reports that she does not drink alcohol and does not use drugs.  Allergies:  Allergies  Allergen Reactions   Meperidine Hcl Nausea And Vomiting    Medications: I have reviewed the patient's current medications.  Results for orders placed or performed during the hospital encounter of 10/13/22 (from the past 48 hour(s))  Comprehensive metabolic panel     Status: Abnormal   Collection Time: 10/13/22  4:11 PM  Result Value Ref Range   Sodium 132 (L) 135 - 145 mmol/L   Potassium 3.1 (L) 3.5 - 5.1 mmol/L  Chloride 97 (L) 98 - 111 mmol/L   CO2 24 22 - 32 mmol/L   Glucose, Bld 110 (H) 70 - 99 mg/dL    Comment: Glucose reference range applies only to samples taken after fasting for at least 8 hours.   BUN 24 (H) 8 - 23 mg/dL   Creatinine, Ser 4.09 (H) 0.44 - 1.00 mg/dL   Calcium 8.5 (L) 8.9 - 10.3 mg/dL   Total Protein 6.3 (L) 6.5 - 8.1 g/dL   Albumin 3.2 (L) 3.5 - 5.0 g/dL   AST 21 15 - 41 U/L   ALT 15 0 - 44 U/L   Alkaline Phosphatase 86 38 - 126 U/L   Total Bilirubin 1.1 0.3 - 1.2 mg/dL   GFR, Estimated 48 (L) >60 mL/min    Comment: (NOTE) Calculated using the CKD-EPI Creatinine Equation (2021)    Anion gap 11 5 - 15    Comment: Performed at [facility], [address] [place], [place] [address]  CBC     Status:  Abnormal   Collection Time: 10/13/22  4:11 PM  Result Value Ref Range   WBC 6.1 4.0 - 10.5 K/uL   RBC 2.93 (L) 3.87 - 5.11 MIL/uL   Hemoglobin 9.7 (L) 12.0 - 15.0 g/dL   HCT 47.8 (L) 29.5 - 62.1 %   MCV 107.2 (H) 80.0 - 100.0 fL   MCH 33.1 26.0 - 34.0 pg   MCHC 30.9 30.0 - 36.0 g/dL   RDW 30.8 (H) 65.7 - 84.6 %   Platelets 183 150 - 400 K/uL   nRBC 0.0 0.0 - 0.2 %    Comment: Performed at [facility], [address], [place], [place] [address]  Protime-INR     Status: Abnormal   Collection Time: 10/13/22  4:11 PM  Result Value Ref Range   Prothrombin Time 21.6 (H) 11.4 - 15.2 seconds   INR 1.9 (H) 0.8 - 1.2    Comment: (NOTE) INR goal varies based on device and disease states. Performed at [facility], [address] [place], [place] [address]   Sample to Blood Bank     Status: None   Collection Time: 10/13/22  4:11 PM  Result Value Ref Range   Blood Bank Specimen SAMPLE AVAILABLE FOR TESTING    Sample Expiration      10/16/2022,2359 Performed at [facility], [address] [place], [place] [address]   I-Stat Chem 8, ED     Status: Abnormal   Collection Time: 10/13/22  4:14 PM  Result Value Ref Range   Sodium 134 (L) 135 - 145 mmol/L   Potassium 3.1 (L) 3.5 - 5.1 mmol/L   Chloride 95 (L) 98 - 111 mmol/L   BUN 25 (H) 8 - 23 mg/dL   Creatinine, Ser 0.27 (H) 0.44 - 1.00 mg/dL   Glucose, Bld 253 (H) 70 - 99 mg/dL    Comment: Glucose reference range applies only to samples taken after fasting for at least 8 hours.   Calcium, Ion 1.08 (L) 1.15 - 1.40 mmol/L   TCO2 26 22 - 32 mmol/L   Hemoglobin 10.5 (L) 12.0 - 15.0 g/dL   HCT 66.4 (L) 40.3 - 47.4 %    CT CHEST ABDOMEN PELVIS W CONTRAST  Result Date: 10/13/2022 CLINICAL DATA:  Pain after fall.  On Eliquis EXAM: CT CHEST, ABDOMEN, AND PELVIS WITH CONTRAST TECHNIQUE: Multidetector CT imaging of the chest, abdomen and pelvis was performed following the standard  protocol during bolus administration of  intravenous contrast. RADIATION DOSE REDUCTION: This exam was performed according to the departmental dose-optimization program which includes automated exposure control, adjustment of the mA and/or kV according to patient size and/or use of iterative reconstruction technique. CONTRAST:  75mL OMNIPAQUE IOHEXOL 350 MG/ML SOLN COMPARISON:  None Available. FINDINGS: CT CHEST FINDINGS Cardiovascular: Heart is enlarged particularly the right atrium. No pericardial effusion. There are calcifications in the area of the aortic valve. The thoracic aorta overall has a normal course and caliber with some scattered vascular calcifications. Coronary artery calcifications are seen. Is also significant calcifications along the great vessels. No mediastinal hematoma. Mediastinum/Nodes: No specific abnormal lymph node enlargement identified in the axillary regions, hilum or mediastinum. Normal caliber thoracic esophagus. Heterogeneous thyroid. Lungs/Pleura: There is a small right pleural effusion. Trace left. Breathing motion seen throughout the examination. There are some areas of bandlike changes along the lung bases likely scar or atelectasis. There are some ill-defined areas of ground-glass in both lungs as well. Please correlate for any evidence of air trapping. Mild bronchial wall thickening identified in several locations. No pneumothorax. Musculoskeletal: Scattered degenerative changes along the spine. Degenerative changes of the shoulders. Fractures involving the anterior aspect of the left second, third, fourth, fifth ribs. Based on appearance these are age-indeterminate. Please correlate to point tenderness. CT ABDOMEN PELVIS FINDINGS Hepatobiliary: Contrast is in the late arterial phase. Grossly preserved hepatic parenchyma. Dependent stone in the nondilated gallbladder. Patent portal vein. Pancreas: Unremarkable. No pancreatic ductal dilatation or surrounding inflammatory changes. Spleen: Normal in size without focal  abnormality. Adrenals/Urinary Tract: Slight thickening of the adrenal glands, nonspecific. Malrotated right kidney. Mild bilateral renal atrophy. No enhancing renal mass or collecting system dilatation. Distended urinary bladder. Stomach/Bowel: No oral contrast. Significant stool in the rectum. Scattered colonic diverticula. No large bowel or small bowel dilatation. Stomach is relatively collapsed. Small bowel is nondilated. No free air or free fluid. Vascular/Lymphatic: Aortic atherosclerosis. No enlarged abdominal or pelvic lymph nodes. Reproductive: Uterus and bilateral adnexa are unremarkable. Other: Nonspecific presacral fat stranding. Diffuse anasarca identified as well. No free intra-air or free fluid. Musculoskeletal: Left-sided rectus muscle hematoma identified. This extends cephalocaudal for proximally 9 cm. Transverse dimensions of 9.3 by 4.4 cm. No active extravasation. Curvature and degenerative changes along the spine. Degenerative changes of the pelvis. IMPRESSION: Left-sided rectus muscle hematoma measuring up to 9.3 cm. No active extravasation. Multiple left-sided anterior possibly acute rib fractures. No underlying pneumothorax. Small right pleural effusion.  Tiny left Enlarged heart. Colonic diverticulosis. Significant stool in the rectum. No bowel obstruction, free air or fluid collection. Gallstone. Anasarca.  Nonspecific stranding in the presacral pelvis. Electronically Signed   By: Karen Kays M.D.   On: 10/13/2022 17:17   CT Head Wo Contrast  Result Date: 10/13/2022 CLINICAL DATA:  Head trauma, minor (Age >= 65y); Facial trauma, blunt; Neck trauma (Age >= 65y) EXAM: CT HEAD WITHOUT CONTRAST CT MAXILLOFACIAL WITHOUT CONTRAST CT CERVICAL SPINE WITHOUT CONTRAST TECHNIQUE: Multidetector CT imaging of the head, cervical spine, and maxillofacial structures were performed using the standard protocol without intravenous contrast. Multiplanar CT image reconstructions of the cervical spine and  maxillofacial structures were also generated. RADIATION DOSE REDUCTION: This exam was performed according to the departmental dose-optimization program which includes automated exposure control, adjustment of the mA and/or kV according to patient size and/or use of iterative reconstruction technique. COMPARISON:  None Available. FINDINGS: CT HEAD FINDINGS Brain: No evidence of acute infarction, hemorrhage, hydrocephalus, extra-axial collection. There is  a 0.0 x 1.9 x 2.2 cm suprasellar mass with mass effect on the optic chiasm. 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 Sequela of moderate chronic microvascular ischemic change. Ventriculomegaly likely secondary to generalized volume loss. Vascular: No hyperdense vessel or unexpected calcification. Skull: Soft tissue hematoma along the left lateral frontal scalp. No evidence of underlying calvarial fracture. Other: None. CT MAXILLOFACIAL FINDINGS Osseous: No fracture or mandibular dislocation. No destructive process. Orbits: Negative. No traumatic or inflammatory finding. Sinuses: No middle ear or mastoid effusion. Paranasal sinuses are clear. Bilateral lens replacement. Orbits are otherwise unremarkable. Soft tissues: Soft tissue injury to the posterior auricular soft tissues on the left. CT CERVICAL SPINE FINDINGS Alignment: Straightening of the normal cervical lordosis. Grade 1 anterolisthesis of C3 on C4 and C4 on C5. Skull base and vertebrae: No acute fracture. No primary bone lesion or focal pathologic process. Soft tissues and spinal canal: No prevertebral fluid or swelling. No visible canal hematoma. Disc levels:  No evidence of high-grade spinal canal stenosis. Upper chest: Negative. Other: None IMPRESSION: 1. Soft tissue hematoma along the left lateral frontal scalp. No evidence of underlying calvarial fracture or  intracranial injury. 2. No acute facial bone fracture. 3. No acute cervical spine fracture. 4. There is a 0.0 x 1.9 x 2.2 cm suprasellar mass with mass effect on the optic chiasm. 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 Electronically Signed   By: Lorenza Cambridge M.D.   On: 10/13/2022 17:11   CT Cervical Spine Wo Contrast  Result Date: 10/13/2022 CLINICAL DATA:  Head trauma, minor (Age >= 65y); Facial trauma, blunt; Neck trauma (Age >= 65y) EXAM: CT HEAD WITHOUT CONTRAST CT MAXILLOFACIAL WITHOUT CONTRAST CT CERVICAL SPINE WITHOUT CONTRAST TECHNIQUE: Multidetector CT imaging of the head, cervical spine, and maxillofacial structures were performed using the standard protocol without intravenous contrast. Multiplanar CT image reconstructions of the cervical spine and maxillofacial structures were also generated. RADIATION DOSE REDUCTION: This exam was performed according to the departmental dose-optimization program which includes automated exposure control, adjustment of the mA and/or kV according to patient size and/or use of iterative reconstruction technique. COMPARISON:  None Available. FINDINGS: CT HEAD FINDINGS Brain: No evidence of acute infarction, hemorrhage, hydrocephalus, extra-axial collection. There is a 0.0 x 1.9 x 2.2 cm suprasellar mass with mass effect on the optic chiasm. 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 Sequela of moderate chronic microvascular ischemic change. Ventriculomegaly likely secondary to generalized volume loss. Vascular: No hyperdense vessel or unexpected calcification. Skull: Soft tissue hematoma along the left lateral frontal scalp. No evidence of underlying calvarial fracture. Other:  None. CT MAXILLOFACIAL FINDINGS Osseous: No fracture or mandibular dislocation. No destructive process. Orbits: Negative. No traumatic or inflammatory finding. Sinuses: No middle ear or mastoid effusion. Paranasal sinuses are clear. Bilateral lens replacement. Orbits are otherwise unremarkable. Soft tissues: Soft tissue injury to the posterior auricular soft tissues on the left. CT CERVICAL SPINE FINDINGS Alignment: Straightening of the normal cervical lordosis. Grade 1 anterolisthesis of C3 on C4 and C4 on C5. Skull base  and vertebrae: No acute fracture. No primary bone lesion or focal pathologic process. Soft tissues and spinal canal: No prevertebral fluid or swelling. No visible canal hematoma. Disc levels:  No evidence of high-grade spinal canal stenosis. Upper chest: Negative. Other: None IMPRESSION: 1. Soft tissue hematoma along the left lateral frontal scalp. No evidence of underlying calvarial fracture or intracranial injury. 2. No acute facial bone fracture. 3. No acute cervical spine fracture. 4. There is a 0.0 x 1.9 x 2.2 cm suprasellar mass with mass effect on the optic chiasm. 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 Electronically Signed   By: Lorenza Cambridge M.D.   On: 10/13/2022 17:11   CT Maxillofacial Wo Contrast  Result Date: 10/13/2022 CLINICAL DATA:  Head trauma, minor (Age >= 65y); Facial trauma, blunt; Neck trauma (Age >= 65y) EXAM: CT HEAD WITHOUT CONTRAST CT MAXILLOFACIAL WITHOUT CONTRAST CT CERVICAL SPINE WITHOUT CONTRAST TECHNIQUE: Multidetector CT imaging of the head, cervical spine, and maxillofacial structures were performed using the standard protocol without intravenous contrast. Multiplanar CT image reconstructions of the cervical spine and maxillofacial structures were also generated. RADIATION DOSE REDUCTION: This exam was performed  according to the departmental dose-optimization program which includes automated exposure control, adjustment of the mA and/or kV according to patient size and/or use of iterative reconstruction technique. COMPARISON:  None Available. FINDINGS: CT HEAD FINDINGS Brain: No evidence of acute infarction, hemorrhage, hydrocephalus, extra-axial collection. There is a 0.0 x 1.9 x 2.2 cm suprasellar mass with mass effect on the optic chiasm. 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 Sequela of moderate chronic microvascular ischemic change. Ventriculomegaly likely secondary to generalized volume loss. Vascular: No hyperdense vessel or unexpected calcification. Skull: Soft tissue hematoma along the left lateral frontal scalp. No evidence of underlying calvarial fracture. Other: None. CT MAXILLOFACIAL FINDINGS Osseous: No fracture or mandibular dislocation. No destructive process. Orbits: Negative. No traumatic or inflammatory finding. Sinuses: No middle ear or mastoid effusion. Paranasal sinuses are clear. Bilateral lens replacement. Orbits are otherwise unremarkable. Soft tissues: Soft tissue injury to the posterior auricular soft tissues on the left. CT CERVICAL SPINE FINDINGS Alignment: Straightening of the normal cervical lordosis. Grade 1 anterolisthesis of C3 on C4 and C4 on C5. Skull base and vertebrae: No acute fracture. No primary bone lesion or focal pathologic process. Soft tissues and spinal canal: No prevertebral fluid or swelling. No visible canal hematoma. Disc levels:  No evidence of high-grade spinal canal stenosis. Upper chest: Negative. Other: None IMPRESSION: 1. Soft tissue hematoma along the left lateral frontal scalp. No evidence of underlying calvarial fracture or intracranial injury. 2. No acute facial bone fracture. 3. No acute cervical spine fracture. 4.  There is a 0.0 x 1.9 x 2.2 cm suprasellar mass with mass effect on the optic chiasm. 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 Electronically Signed   By: Lorenza Cambridge M.D.   On: 10/13/2022 17:11   DG Pelvis Portable  Result Date: 10/13/2022 CLINICAL DATA:  Fall. EXAM: PORTABLE PELVIS 1-2 VIEWS COMPARISON:  None Available. FINDINGS: There is no  evidence of pelvic fracture or diastasis. No pelvic bone lesions are seen. IMPRESSION: Negative. Electronically Signed   By: Lupita Raider M.D.   On: 10/13/2022 16:37   DG Shoulder Left Portable  Result Date: 10/13/2022 CLINICAL DATA:  Fall. EXAM: LEFT SHOULDER COMPARISON:  October 17, 2013. FINDINGS: No definite fracture is noted. Old left rib fractures are noted. There does appear to be some elevation of the distal end of the left clavicle relative to acromion which was present on prior exam suggesting acromioclavicular joint separation. IMPRESSION: Stable elevation of distal end of left clavicle relative to the acromion suggesting stable old acromioclavicular joint separation. No acute fracture or dislocation is noted. Electronically Signed   By: Lupita Raider M.D.   On: 10/13/2022 16:37   DG Elbow 2 Views Left  Result Date: 10/13/2022 CLINICAL DATA:  Fall. EXAM: LEFT ELBOW - 2 VIEW COMPARISON:  None Available. FINDINGS: There is no evidence of fracture, dislocation, or joint effusion. There is no evidence of arthropathy or other focal bone abnormality. Soft tissues are unremarkable. IMPRESSION: Negative. Electronically Signed   By: Lupita Raider M.D.   On: 10/13/2022 16:35   DG Knee 2 Views Left  Result Date: 10/13/2022 CLINICAL DATA:  Left knee pain after fall. EXAM: LEFT KNEE - 1-2 VIEW COMPARISON:  None Available. FINDINGS: No evidence of fracture, dislocation, or joint effusion. No evidence of arthropathy  or other focal bone abnormality. Vascular calcifications are noted. IMPRESSION: No significant abnormality seen in the left knee. Electronically Signed   By: Lupita Raider M.D.   On: 10/13/2022 16:33   DG Hip Unilat W or Wo Pelvis 2-3 Views Left  Result Date: 10/13/2022 CLINICAL DATA:  Status post fall.  Rule out fracture. EXAM: DG HIP (WITH OR WITHOUT PELVIS) 2-3V LEFT COMPARISON:  None Available. FINDINGS: There is no evidence of hip fracture or dislocation. There is cortical irregularity of the superior pubic ramus on the left, obscured by formed stool in the rectum. IMPRESSION: 1. No evidence of hip fracture or dislocation. 2. Cortical irregularity of the superior pubic ramus on the left, obscured by formed stool in the rectum. Consider further evaluation with dedicated pelvic radiograph or CT. Electronically Signed   By: Ted Mcalpine M.D.   On: 10/13/2022 16:32   DG Chest Port 1 View  Result Date: 10/13/2022 CLINICAL DATA:  Trauma. EXAM: PORTABLE CHEST 1 VIEW COMPARISON:  Sep 07, 2022 FINDINGS: Cardiomediastinal silhouette is enlarged. Mediastinal contours appear intact. Possible interstitial pulmonary edema with small bilateral pleural effusions. Osseous structures are without acute abnormality. Soft tissues are grossly normal. IMPRESSION: Possible interstitial pulmonary edema with small bilateral pleural effusions. Electronically Signed   By: Ted Mcalpine M.D.   On: 10/13/2022 16:30    ROS - all of the below systems have been reviewed with the patient and positives are indicated with bold text General: chills, fever or night sweats Eyes: blurry vision or double vision ENT: epistaxis or sore throat; hearing loss (chronic) Allergy/Immunology: itchy/watery eyes or nasal congestion Hematologic/Lymphatic: bleeding problems, blood clots or swollen lymph nodes Endocrine: temperature intolerance or unexpected weight changes Breast: new or changing breast lumps or nipple  discharge Resp: cough, shortness of breath (2/2 pain in left chest wall), or wheezing CV: chest pain (see above) or dyspnea on exertion GI: nausea, vomiting, abdominal pain GU: dysuria, trouble voiding, or hematuria MSK: joint pain or joint stiffness (chronic) Neuro: TIA or stroke symptoms Derm: pruritus and skin lesion changes Psych: anxiety  and depression  PE Blood pressure 129/84, pulse 60, temperature (!) 97.5 F (36.4 C), temperature source Oral, resp. rate 10, height 5\' 4"  (1.626 m), weight 74.4 kg, SpO2 99 %. Physical Exam Constitutional: NAD; conversant; no evident deformities Eyes: Moist conjunctiva; no lid lag; anicteric Neck: Trachea midline Lungs: Normal respiratory effort CV: Irreg rate/rhythm GI: Abd soft, palpable hematoma LLQ; no overlying ecchymosis; no abdominal tenderness including over hematoma; nondistended; no rebound nor guarding MSK: Normal range of motion of extremities Psychiatric: Appropriate affect; alert and oriented x3  Results for orders placed or performed during the hospital encounter of 10/13/22 (from the past 48 hour(s))  Comprehensive metabolic panel     Status: Abnormal   Collection Time: 10/13/22  4:11 PM  Result Value Ref Range   Sodium 132 (L) 135 - 145 mmol/L   Potassium 3.1 (L) 3.5 - 5.1 mmol/L   Chloride 97 (L) 98 - 111 mmol/L   CO2 24 22 - 32 mmol/L   Glucose, Bld 110 (H) 70 - 99 mg/dL    Comment: Glucose reference range applies only to samples taken after fasting for at least 8 hours.   BUN 24 (H) 8 - 23 mg/dL   Creatinine, Ser 9.62 (H) 0.44 - 1.00 mg/dL   Calcium 8.5 (L) 8.9 - 10.3 mg/dL   Total Protein 6.3 (L) 6.5 - 8.1 g/dL   Albumin 3.2 (L) 3.5 - 5.0 g/dL   AST 21 15 - 41 U/L   ALT 15 0 - 44 U/L   Alkaline Phosphatase 86 38 - 126 U/L   Total Bilirubin 1.1 0.3 - 1.2 mg/dL   GFR, Estimated 48 (L) >60 mL/min    Comment: (NOTE) Calculated using the CKD-EPI Creatinine Equation (2021)    Anion gap 11 5 - 15    Comment:  Performed at [facility], [address] [place], [place] [address]  CBC     Status: Abnormal   Collection Time: 10/13/22  4:11 PM  Result Value Ref Range   WBC 6.1 4.0 - 10.5 K/uL   RBC 2.93 (L) 3.87 - 5.11 MIL/uL   Hemoglobin 9.7 (L) 12.0 - 15.0 g/dL   HCT 13.2 (L) 44.0 - 10.2 %   MCV 107.2 (H) 80.0 - 100.0 fL   MCH 33.1 26.0 - 34.0 pg   MCHC 30.9 30.0 - 36.0 g/dL   RDW 72.5 (H) 36.6 - 44.0 %   Platelets 183 150 - 400 K/uL   nRBC 0.0 0.0 - 0.2 %    Comment: Performed at [facility], [address] [place], [place] [address]  Protime-INR     Status: Abnormal   Collection Time: 10/13/22  4:11 PM  Result Value Ref Range   Prothrombin Time 21.6 (H) 11.4 - 15.2 seconds   INR 1.9 (H) 0.8 - 1.2    Comment: (NOTE) INR goal varies based on device and disease states. Performed at [facility], [address] [place], [place] [address]   Sample to Blood Bank     Status: None   Collection Time: 10/13/22  4:11 PM  Result Value Ref Range   Blood Bank Specimen SAMPLE AVAILABLE FOR TESTING    Sample Expiration      10/16/2022,2359 Performed at [facility], [address] [place], [place] [address]   I-Stat Chem 8, ED     Status: Abnormal   Collection Time: 10/13/22  4:14 PM  Result Value Ref Range   Sodium 134 (  L) 135 - 145 mmol/L   Potassium 3.1 (L) 3.5 - 5.1 mmol/L   Chloride 95 (L) 98 - 111 mmol/L   BUN 25 (H) 8 - 23 mg/dL   Creatinine, Ser 2.95 (H) 0.44 - 1.00 mg/dL   Glucose, Bld 621 (H) 70 - 99 mg/dL    Comment: Glucose reference range applies only to samples taken after fasting for at least 8 hours.   Calcium, Ion 1.08 (L) 1.15 - 1.40 mmol/L   TCO2 26 22 - 32 mmol/L   Hemoglobin 10.5 (L) 12.0 - 15.0 g/dL   HCT 30.8 (L) 65.7 - 84.6 %    CT CHEST ABDOMEN PELVIS W CONTRAST  Result Date: 10/13/2022 CLINICAL DATA:  Pain after fall.  On Eliquis EXAM: CT CHEST, ABDOMEN, AND PELVIS WITH CONTRAST TECHNIQUE: Multidetector CT imaging of the chest,  abdomen and pelvis was performed following the standard protocol during bolus administration of intravenous contrast. RADIATION DOSE REDUCTION: This exam was performed according to the departmental dose-optimization program which includes automated exposure control, adjustment of the mA and/or kV according to patient size and/or use of iterative reconstruction technique. CONTRAST:  75mL OMNIPAQUE IOHEXOL 350 MG/ML SOLN COMPARISON:  None Available. FINDINGS: CT CHEST FINDINGS Cardiovascular: Heart is enlarged particularly the right atrium. No pericardial effusion. There are calcifications in the area of the aortic valve. The thoracic aorta overall has a normal course and caliber with some scattered vascular calcifications. Coronary artery calcifications are seen. Is also significant calcifications along the great vessels. No mediastinal hematoma. Mediastinum/Nodes: No specific abnormal lymph node enlargement identified in the axillary regions, hilum or mediastinum. Normal caliber thoracic esophagus. Heterogeneous thyroid. Lungs/Pleura: There is a small right pleural effusion. Trace left. Breathing motion seen throughout the examination. There are some areas of bandlike changes along the lung bases likely scar or atelectasis. There are some ill-defined areas of ground-glass in both lungs as well. Please correlate for any evidence of air trapping. Mild bronchial wall thickening identified in several locations. No pneumothorax. Musculoskeletal: Scattered degenerative changes along the spine. Degenerative changes of the shoulders. Fractures involving the anterior aspect of the left second, third, fourth, fifth ribs. Based on appearance these are age-indeterminate. Please correlate to point tenderness. CT ABDOMEN PELVIS FINDINGS Hepatobiliary: Contrast is in the late arterial phase. Grossly preserved hepatic parenchyma. Dependent stone in the nondilated gallbladder. Patent portal vein. Pancreas: Unremarkable. No  pancreatic ductal dilatation or surrounding inflammatory changes. Spleen: Normal in size without focal abnormality. Adrenals/Urinary Tract: Slight thickening of the adrenal glands, nonspecific. Malrotated right kidney. Mild bilateral renal atrophy. No enhancing renal mass or collecting system dilatation. Distended urinary bladder. Stomach/Bowel: No oral contrast. Significant stool in the rectum. Scattered colonic diverticula. No large bowel or small bowel dilatation. Stomach is relatively collapsed. Small bowel is nondilated. No free air or free fluid. Vascular/Lymphatic: Aortic atherosclerosis. No enlarged abdominal or pelvic lymph nodes. Reproductive: Uterus and bilateral adnexa are unremarkable. Other: Nonspecific presacral fat stranding. Diffuse anasarca identified as well. No free intra-air or free fluid. Musculoskeletal: Left-sided rectus muscle hematoma identified. This extends cephalocaudal for proximally 9 cm. Transverse dimensions of 9.3 by 4.4 cm. No active extravasation. Curvature and degenerative changes along the spine. Degenerative changes of the pelvis. IMPRESSION: Left-sided rectus muscle hematoma measuring up to 9.3 cm. No active extravasation. Multiple left-sided anterior possibly acute rib fractures. No underlying pneumothorax. Small right pleural effusion.  Tiny left Enlarged heart. Colonic diverticulosis. Significant stool in the rectum. No bowel obstruction, free air or fluid collection. Gallstone. Anasarca.  Nonspecific stranding in the presacral pelvis. Electronically Signed   By: Karen Kays M.D.   On: 10/13/2022 17:17   CT Head Wo Contrast  Result Date: 10/13/2022 CLINICAL DATA:  Head trauma, minor (Age >= 65y); Facial trauma, blunt; Neck trauma (Age >= 65y) EXAM: CT HEAD WITHOUT CONTRAST CT MAXILLOFACIAL WITHOUT CONTRAST CT CERVICAL SPINE WITHOUT CONTRAST TECHNIQUE: Multidetector CT imaging of the head, cervical spine, and maxillofacial structures were performed using the standard  protocol without intravenous contrast. Multiplanar CT image reconstructions of the cervical spine and maxillofacial structures were also generated. RADIATION DOSE REDUCTION: This exam was performed according to the departmental dose-optimization program which includes automated exposure control, adjustment of the mA and/or kV according to patient size and/or use of iterative reconstruction technique. COMPARISON:  None Available. FINDINGS: CT HEAD FINDINGS Brain: No evidence of acute infarction, hemorrhage, hydrocephalus, extra-axial collection. There is a 0.0 x 1.9 x 2.2 cm suprasellar mass with mass effect on the optic chiasm. 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 Sequela of moderate chronic microvascular ischemic change. Ventriculomegaly likely secondary to generalized volume loss. Vascular: No hyperdense vessel or unexpected calcification. Skull: Soft tissue hematoma along the left lateral frontal scalp. No evidence of underlying calvarial fracture. Other: None. CT MAXILLOFACIAL FINDINGS Osseous: No fracture or mandibular dislocation. No destructive process. Orbits: Negative. No traumatic or inflammatory finding. Sinuses: No middle ear or mastoid effusion. Paranasal sinuses are clear. Bilateral lens replacement. Orbits are otherwise unremarkable. Soft tissues: Soft tissue injury to the posterior auricular soft tissues on the left. CT CERVICAL SPINE FINDINGS Alignment: Straightening of the normal cervical lordosis. Grade 1 anterolisthesis of C3 on C4 and C4 on C5. Skull base and vertebrae: No acute fracture. No primary bone lesion or focal pathologic process. Soft tissues and spinal canal: No prevertebral fluid or swelling. No visible canal hematoma. Disc levels:  No evidence of high-grade spinal canal stenosis. Upper chest: Negative. Other: None IMPRESSION: 1. Soft  tissue hematoma along the left lateral frontal scalp. No evidence of underlying calvarial fracture or intracranial injury. 2. No acute facial bone fracture. 3. No acute cervical spine fracture. 4. There is a 0.0 x 1.9 x 2.2 cm suprasellar mass with mass effect on the optic chiasm. 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 Electronically Signed   By: Lorenza Cambridge M.D.   On: 10/13/2022 17:11   CT Cervical Spine Wo Contrast  Result Date: 10/13/2022 CLINICAL DATA:  Head trauma, minor (Age >= 65y); Facial trauma, blunt; Neck trauma (Age >= 65y) EXAM: CT HEAD WITHOUT CONTRAST CT MAXILLOFACIAL WITHOUT CONTRAST CT CERVICAL SPINE WITHOUT CONTRAST TECHNIQUE: Multidetector CT imaging of the head, cervical spine, and maxillofacial structures were performed using the standard protocol without intravenous contrast. Multiplanar CT image reconstructions of the cervical spine and maxillofacial structures were also generated. RADIATION DOSE REDUCTION: This exam was performed according to the departmental dose-optimization program which includes automated exposure control, adjustment of the mA and/or kV according to patient size and/or use of iterative reconstruction technique. COMPARISON:  None Available. FINDINGS: CT HEAD FINDINGS Brain: No evidence of acute infarction, hemorrhage, hydrocephalus, extra-axial collection. There is a 0.0 x 1.9 x 2.2 cm suprasellar mass with mass effect on the  optic chiasm. 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 Sequela of moderate chronic microvascular ischemic change. Ventriculomegaly likely secondary to generalized volume loss. Vascular: No hyperdense vessel or unexpected calcification. Skull: Soft tissue  hematoma along the left lateral frontal scalp. No evidence of underlying calvarial fracture. Other: None. CT MAXILLOFACIAL FINDINGS Osseous: No fracture or mandibular dislocation. No destructive process. Orbits: Negative. No traumatic or inflammatory finding. Sinuses: No middle ear or mastoid effusion. Paranasal sinuses are clear. Bilateral lens replacement. Orbits are otherwise unremarkable. Soft tissues: Soft tissue injury to the posterior auricular soft tissues on the left. CT CERVICAL SPINE FINDINGS Alignment: Straightening of the normal cervical lordosis. Grade 1 anterolisthesis of C3 on C4 and C4 on C5. Skull base and vertebrae: No acute fracture. No primary bone lesion or focal pathologic process. Soft tissues and spinal canal: No prevertebral fluid or swelling. No visible canal hematoma. Disc levels:  No evidence of high-grade spinal canal stenosis. Upper chest: Negative. Other: None IMPRESSION: 1. Soft tissue hematoma along the left lateral frontal scalp. No evidence of underlying calvarial fracture or intracranial injury. 2. No acute facial bone fracture. 3. No acute cervical spine fracture. 4. There is a 0.0 x 1.9 x 2.2 cm suprasellar mass with mass effect on the optic chiasm. 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 Electronically Signed   By: Lorenza Cambridge M.D.   On: 10/13/2022 17:11   CT Maxillofacial Wo Contrast  Result Date: 10/13/2022 CLINICAL DATA:  Head trauma, minor (Age >= 65y); Facial trauma, blunt; Neck trauma (Age >= 65y) EXAM: CT HEAD WITHOUT CONTRAST CT MAXILLOFACIAL WITHOUT CONTRAST CT CERVICAL SPINE WITHOUT CONTRAST TECHNIQUE: Multidetector CT imaging of the head, cervical spine, and maxillofacial structures were performed using the standard protocol without intravenous contrast. Multiplanar CT image reconstructions of the cervical spine  and maxillofacial structures were also generated. RADIATION DOSE REDUCTION: This exam was performed according to the departmental dose-optimization program which includes automated exposure control, adjustment of the mA and/or kV according to patient size and/or use of iterative reconstruction technique. COMPARISON:  None Available. FINDINGS: CT HEAD FINDINGS Brain: No evidence of acute infarction, hemorrhage, hydrocephalus, extra-axial collection. There is a 0.0 x 1.9 x 2.2 cm suprasellar mass with mass effect on the optic chiasm. 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 Sequela of moderate chronic microvascular ischemic change. Ventriculomegaly likely secondary to generalized volume loss. Vascular: No hyperdense vessel or unexpected calcification. Skull: Soft tissue hematoma along the left lateral frontal scalp. No evidence of underlying calvarial fracture. Other: None. CT MAXILLOFACIAL FINDINGS Osseous: No fracture or mandibular dislocation. No destructive process. Orbits: Negative. No traumatic or inflammatory finding. Sinuses: No middle ear or mastoid effusion. Paranasal sinuses are clear. Bilateral lens replacement. Orbits are otherwise unremarkable. Soft tissues: Soft tissue injury to the posterior auricular soft tissues on the left. CT CERVICAL SPINE FINDINGS Alignment: Straightening of the normal cervical lordosis. Grade 1 anterolisthesis of C3 on C4 and C4 on C5. Skull base and vertebrae: No acute fracture. No primary bone lesion or focal pathologic process. Soft tissues  and spinal canal: No prevertebral fluid or swelling. No visible canal hematoma. Disc levels:  No evidence of high-grade spinal canal stenosis. Upper chest: Negative. Other: None IMPRESSION: 1. Soft tissue hematoma along the left lateral frontal scalp. No evidence of underlying calvarial  fracture or intracranial injury. 2. No acute facial bone fracture. 3. No acute cervical spine fracture. 4. There is a 0.0 x 1.9 x 2.2 cm suprasellar mass with mass effect on the optic chiasm. 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 Electronically Signed   By: Lorenza Cambridge M.D.   On: 10/13/2022 17:11   DG Pelvis Portable  Result Date: 10/13/2022 CLINICAL DATA:  Fall. EXAM: PORTABLE PELVIS 1-2 VIEWS COMPARISON:  None Available. FINDINGS: There is no evidence of pelvic fracture or diastasis. No pelvic bone lesions are seen. IMPRESSION: Negative. Electronically Signed   By: Lupita Raider M.D.   On: 10/13/2022 16:37   DG Shoulder Left Portable  Result Date: 10/13/2022 CLINICAL DATA:  Fall. EXAM: LEFT SHOULDER COMPARISON:  October 17, 2013. FINDINGS: No definite fracture is noted. Old left rib fractures are noted. There does appear to be some elevation of the distal end of the left clavicle relative to acromion which was present on prior exam suggesting acromioclavicular joint separation. IMPRESSION: Stable elevation of distal end of left clavicle relative to the acromion suggesting stable old acromioclavicular joint separation. No acute fracture or dislocation is noted. Electronically Signed   By: Lupita Raider M.D.   On: 10/13/2022 16:37   DG Elbow 2 Views Left  Result Date: 10/13/2022 CLINICAL DATA:  Fall. EXAM: LEFT ELBOW - 2 VIEW COMPARISON:  None Available. FINDINGS: There is no evidence of fracture, dislocation, or joint effusion. There is no evidence of arthropathy or other focal bone abnormality. Soft tissues are unremarkable. IMPRESSION: Negative. Electronically Signed   By: Lupita Raider M.D.   On: 10/13/2022 16:35   DG Knee 2 Views Left  Result Date: 10/13/2022 CLINICAL DATA:  Left knee pain after fall. EXAM: LEFT KNEE - 1-2 VIEW COMPARISON:   None Available. FINDINGS: No evidence of fracture, dislocation, or joint effusion. No evidence of arthropathy or other focal bone abnormality. Vascular calcifications are noted. IMPRESSION: No significant abnormality seen in the left knee. Electronically Signed   By: Lupita Raider M.D.   On: 10/13/2022 16:33   DG Hip Unilat W or Wo Pelvis 2-3 Views Left  Result Date: 10/13/2022 CLINICAL DATA:  Status post fall.  Rule out fracture. EXAM: DG HIP (WITH OR WITHOUT PELVIS) 2-3V LEFT COMPARISON:  None Available. FINDINGS: There is no evidence of hip fracture or dislocation. There is cortical irregularity of the superior pubic ramus on the left, obscured by formed stool in the rectum. IMPRESSION: 1. No evidence of hip fracture or dislocation. 2. Cortical irregularity of the superior pubic ramus on the left, obscured by formed stool in the rectum. Consider further evaluation with dedicated pelvic radiograph or CT. Electronically Signed   By: Ted Mcalpine M.D.   On: 10/13/2022 16:32   DG Chest Port 1 View  Result Date: 10/13/2022 CLINICAL DATA:  Trauma. EXAM: PORTABLE CHEST 1 VIEW COMPARISON:  Sep 07, 2022 FINDINGS: Cardiomediastinal silhouette is enlarged. Mediastinal contours appear intact. Possible interstitial pulmonary edema with small bilateral pleural effusions. Osseous structures are without acute abnormality. Soft tissues are grossly normal. IMPRESSION:  Possible interstitial pulmonary edema with small bilateral pleural effusions. Electronically Signed   By: Ted Mcalpine M.D.   On: 10/13/2022 16:30      Assessment/Plan: 89yoF s/p fall  Left 2-5 rib fractures - Sats <90% on room air - on 2L, improved to 100%. IS 10x/hr while awake; multidmodal pain control Rectus sheath hematoma - monitor abdominal exam, no extrav on CT; if expanding, would consider reversal, otherwise hold for now Left ear lac - repaired by EDP with suture Left forehead lac - repaired by EDP with dermabond Chronic  constipation - based on CT, large rectal stool burden. Bowel regimen, enemas tomorrow  Afib - hold anticoagulation; if evident expansion or worsening symptoms of her hematoma, consider reversal  PPX: SCDs; holding chemical dvt ppx in light of onboard anticoagulation, rectus sheath hematoma  Dispo - Admit to 4NP for closer monitoring; pulse ox; [facility] consulted for assistance in medical comorbidity mgmt  - appreciate their help in her care  Her son whom lives at home with her prior to rehab was at bedside.  All the above was reviewed with him.  All of his questions were answered, expressed understanding and agreement to plan.  I spent a total of 80 minutes in both face-to-face and non-face-to-face activities, excluding procedures performed, for this visit on the date of this encounter.  Marin Olp, MD [facility]

## 2022-10-13 NOTE — ED Triage Notes (Signed)
Pt BIB [facility] from [facility] in [place]. Pt fell in the bathroom while trying to pull her pants up. Pt normally walks with a walker. Pt stated she fell forward and then fell on her L side. Pt takes eliquis.   EMS VS BP 122/88 RR 22 O2 97% RA CBG 142

## 2022-10-13 NOTE — ED Provider Notes (Signed)
[facility] Provider Note  CSN: [id number] Arrival date & time: 10/13/22 1553  Chief Complaint(s) Fall (Level 2 FOT)  HPI Diana [name] is a 89 y.o. female with PMH A-fib on Eliquis, HTN, CHF who presents emergency room for evaluation of a mechanical fall.  Patient arrives as a level 2 trauma after falling onto her left side.  She arrives with visible trauma over the orbit on the left and complaints of left arm and shoulder pain, left hip pain.  Patient arrives hemodynamically stable and is denying nausea, vomiting, shortness of breath or other systemic symptoms.   Past Medical History Past Medical History:  Diagnosis Date   Arthritis    Bacterial pneumonia, unspecified    Cancer , breast    Candidiasis of mouth    Chronic airway obstruction, not elsewhere classified    son not aware of this   Encephalitis 1961   Family history of adverse reaction to anesthesia    son has nausea   Heart murmur    has had it for years; very mild AS 05/2015 echo   Hyposmolality and/or hyponatremia    Lumbago    Obstructive chronic bronchitis with exacerbation (HCC)    Other abnormal glucose    Pain in joint, shoulder region    Palpitations    Persistent disorder of initiating or maintaining sleep    Restless leg syndrome    Unspecified essential hypertension    Unspecified hypothyroidism    Unspecified viral infection, in conditions classified elsewhere and of unspecified site    Unspecified vitamin D deficiency    Urinary tract infection, site not specified    Patient Active Problem List   Diagnosis Date Noted   Hypokalemia 10/14/2022   Rib fractures 10/13/2022   Congestive heart failure (HCC) 09/09/2022   Dilated cardiomyopathy (HCC) 09/09/2022   Acute HFrEF (heart failure with reduced ejection fraction) (HCC) 09/08/2022   Fluid overload 09/03/2022   Troponin I above reference range 09/03/2022   Atrial fibrillation (HCC) 09/03/2022   Cellulitis of lower  extremity 08/27/2022   Venous stasis ulcers of both lower extremities (HCC) 08/27/2022   Peripheral edema 06/30/2022   Hematoma 04/13/2022   Other chronic pain 06/26/2021   Burning with urination 03/01/2018   Iron deficiency anemia due to chronic blood loss 06/16/2017   Lymphedema of right arm 11/17/2016   Vaginal irritation 10/26/2016   Right-sided low back pain without sciatica 08/22/2015   Spinal stenosis in cervical region 08/07/2015   Cervical spondylosis with myelopathy 06/26/2015   Aortic valve calcification 06/03/2015   BPV (benign positional vertigo) 01/29/2015   Poor balance 10/07/2011   History of right breast cancer 03/29/2011   Vitamin D deficiency 10/17/2009   Prediabetes 10/17/2009   COPD (chronic obstructive pulmonary disease) (HCC) 12/19/2007   INSOMNIA, CHRONIC 10/25/2007   BACK PAIN, LUMBAR 03/01/2007   Hypothyroidism 01/18/2007   HYPONATREMIA, MILD 01/18/2007   Essential hypertension 01/18/2007   Home Medication(s) Prior to Admission medications   Medication Sig Start Date End Date Taking? Authorizing Provider  apixaban (ELIQUIS) 5 MG TABS tablet Take 1 tablet (5 mg total) by mouth 2 (two) times daily. 09/10/22   Leeroy Bock, MD  aspirin EC 81 MG tablet Take 1 tablet (81 mg total) by mouth daily. Swallow whole. 09/11/22   Leeroy Bock, MD  atorvastatin (LIPITOR) 40 MG tablet Take 1 tablet (40 mg total) by mouth daily. 09/11/22   Leeroy Bock, MD  furosemide (LASIX) 40  MG tablet Take 1 tablet (40 mg total) by mouth daily. 09/11/22   Leeroy Bock, MD  levothyroxine (SYNTHROID) 100 MCG tablet Take 1 tablet (100 mcg total) by mouth daily. 08/18/22   Bedsole, Amy E, MD  metoprolol succinate (TOPROL-XL) 25 MG 24 hr tablet Take 1 tablet (25 mg total) by mouth 2 (two) times daily. 09/10/22   Leeroy Bock, MD  potassium chloride (KLOR-CON M) 10 MEQ tablet Take 1 tablet (10 mEq total) by mouth daily. 09/11/22   Antonieta Iba, MD  senna  (SENOKOT) 8.6 MG TABS tablet Take 1 tablet by mouth daily as needed for mild constipation.    [provider]  traZODone (DESYREL) 50 MG tablet TAKE 1/2 TO 1 TABLET BY MOUTH AT [facility] NEEDED FOR SLEEP 04/06/22   Excell Seltzer, MD                                                                                                                                    Past Surgical History Past Surgical History:  Procedure Laterality Date   ABI  04/19/05   Negative   Back MRI  2005   mild bulging disks   EYE SURGERY Bilateral    Cataract surgery    HEMORRHOID SURGERY     MASTECTOMY, RADICAL     POSTERIOR CERVICAL LAMINECTOMY N/A 06/26/2015   Procedure: Cervical three-four POSTERIOR CERVICAL LAMINECTOMY for decompression ;  Surgeon: Loura Halt Ditty, MD;  Location: [facility];  Service: Neurosurgery;  Laterality: N/A;  C34 laminectomy   Venous doppler  04/19/05   LE normal   Family History Family History  Problem Relation Age of Onset   Rheum arthritis Father    Stroke Mother    Diabetes Mother    Healthy Sister    Healthy Sister    Liver cancer Unknown        Aunt   Stomach cancer Unknown        Aunt    Social History Social History   Tobacco Use   Smoking status: Former    Packs/day: 0.50    Years: 42.00    Additional pack years: 0.00    Total pack years: 21.00    Types: Cigarettes   Smokeless tobacco: Never  Substance Use Topics   Alcohol use: No   Drug use: No   Allergies Meperidine hcl  Review of Systems Review of Systems  Cardiovascular:  Positive for chest pain.  Gastrointestinal:  Positive for abdominal pain.  Musculoskeletal:  Positive for arthralgias, back pain, myalgias and neck pain.    Physical Exam Vital Signs  I have reviewed the triage vital signs BP 124/61 (BP Location: Left Arm)   Pulse 100   Temp 98.4 F (36.9 C) (Oral)   Resp 15   Ht 5\' 4"  (1.626 m)   Wt 74.4 kg   SpO2 95%   BMI 28.15 kg/m   Physical  Exam Vitals and  nursing note reviewed.  Constitutional:      General: She is not in acute distress.    Appearance: She is well-developed.  HENT:     Head: Normocephalic.     Comments: Bruising, small laceration over left orbit Eyes:     Conjunctiva/sclera: Conjunctivae normal.  Cardiovascular:     Rate and Rhythm: Normal rate and regular rhythm.     Heart sounds: No murmur heard. Pulmonary:     Effort: Pulmonary effort is normal. No respiratory distress.  Abdominal:     Tenderness: There is abdominal tenderness.  Musculoskeletal:        General: Tenderness present. No swelling.     Cervical back: Neck supple.  Skin:    General: Skin is warm and dry.     Capillary Refill: Capillary refill takes less than 2 seconds.  Neurological:     Mental Status: She is alert.  Psychiatric:        Mood and Affect: Mood normal.     ED Results and Treatments Labs (all labs ordered are listed, but only abnormal results are displayed) Labs Reviewed  COMPREHENSIVE METABOLIC PANEL - Abnormal; Notable for the following components:      Result Value   Sodium 132 (*)    Potassium 3.1 (*)    Chloride 97 (*)    Glucose, Bld 110 (*)    BUN 24 (*)    Creatinine, Ser 1.07 (*)    Calcium 8.5 (*)    Total Protein 6.3 (*)    Albumin 3.2 (*)    GFR, Estimated 48 (*)    All other components within normal limits  CBC - Abnormal; Notable for the following components:   RBC 2.93 (*)    Hemoglobin 9.7 (*)    HCT 31.4 (*)    MCV 107.2 (*)    RDW 18.9 (*)    All other components within normal limits  PROTIME-INR - Abnormal; Notable for the following components:   Prothrombin Time 21.6 (*)    INR 1.9 (*)    All other components within normal limits  CBC - Abnormal; Notable for the following components:   RBC 2.72 (*)    Hemoglobin 8.9 (*)    HCT 27.8 (*)    MCV 102.2 (*)    RDW 18.6 (*)    All other components within normal limits  BASIC METABOLIC PANEL - Abnormal; Notable for the following components:    Sodium 132 (*)    Potassium 3.3 (*)    Chloride 97 (*)    Calcium 8.4 (*)    GFR, Estimated 52 (*)    All other components within normal limits  I-STAT CHEM 8, ED - Abnormal; Notable for the following components:   Sodium 134 (*)    Potassium 3.1 (*)    Chloride 95 (*)    BUN 25 (*)    Creatinine, Ser 1.10 (*)    Glucose, Bld 108 (*)    Calcium, Ion 1.08 (*)    Hemoglobin 10.5 (*)    HCT 31.0 (*)    All other components within normal limits  CBC  SAMPLE TO BLOOD BANK  Radiology CT CHEST ABDOMEN PELVIS W CONTRAST  Result Date: 10/13/2022 CLINICAL DATA:  Pain after fall.  On Eliquis EXAM: CT CHEST, ABDOMEN, AND PELVIS WITH CONTRAST TECHNIQUE: Multidetector CT imaging of the chest, abdomen and pelvis was performed following the standard protocol during bolus administration of intravenous contrast. RADIATION DOSE REDUCTION: This exam was performed according to the departmental dose-optimization program which includes automated exposure control, adjustment of the mA and/or kV according to patient size and/or use of iterative reconstruction technique. CONTRAST:  75mL OMNIPAQUE IOHEXOL 350 MG/ML SOLN COMPARISON:  None Available. FINDINGS: CT CHEST FINDINGS Cardiovascular: Heart is enlarged particularly the right atrium. No pericardial effusion. There are calcifications in the area of the aortic valve. The thoracic aorta overall has a normal course and caliber with some scattered vascular calcifications. Coronary artery calcifications are seen. Is also significant calcifications along the great vessels. No mediastinal hematoma. Mediastinum/Nodes: No specific abnormal lymph node enlargement identified in the axillary regions, hilum or mediastinum. Normal caliber thoracic esophagus. Heterogeneous thyroid. Lungs/Pleura: There is a small right pleural effusion. Trace left. Breathing  motion seen throughout the examination. There are some areas of bandlike changes along the lung bases likely scar or atelectasis. There are some ill-defined areas of ground-glass in both lungs as well. Please correlate for any evidence of air trapping. Mild bronchial wall thickening identified in several locations. No pneumothorax. Musculoskeletal: Scattered degenerative changes along the spine. Degenerative changes of the shoulders. Fractures involving the anterior aspect of the left second, third, fourth, fifth ribs. Based on appearance these are age-indeterminate. Please correlate to point tenderness. CT ABDOMEN PELVIS FINDINGS Hepatobiliary: Contrast is in the late arterial phase. Grossly preserved hepatic parenchyma. Dependent stone in the nondilated gallbladder. Patent portal vein. Pancreas: Unremarkable. No pancreatic ductal dilatation or surrounding inflammatory changes. Spleen: Normal in size without focal abnormality. Adrenals/Urinary Tract: Slight thickening of the adrenal glands, nonspecific. Malrotated right kidney. Mild bilateral renal atrophy. No enhancing renal mass or collecting system dilatation. Distended urinary bladder. Stomach/Bowel: No oral contrast. Significant stool in the rectum. Scattered colonic diverticula. No large bowel or small bowel dilatation. Stomach is relatively collapsed. Small bowel is nondilated. No free air or free fluid. Vascular/Lymphatic: Aortic atherosclerosis. No enlarged abdominal or pelvic lymph nodes. Reproductive: Uterus and bilateral adnexa are unremarkable. Other: Nonspecific presacral fat stranding. Diffuse anasarca identified as well. No free intra-air or free fluid. Musculoskeletal: Left-sided rectus muscle hematoma identified. This extends cephalocaudal for proximally 9 cm. Transverse dimensions of 9.3 by 4.4 cm. No active extravasation. Curvature and degenerative changes along the spine. Degenerative changes of the pelvis. IMPRESSION: Left-sided rectus muscle  hematoma measuring up to 9.3 cm. No active extravasation. Multiple left-sided anterior possibly acute rib fractures. No underlying pneumothorax. Small right pleural effusion.  Tiny left Enlarged heart. Colonic diverticulosis. Significant stool in the rectum. No bowel obstruction, free air or fluid collection. Gallstone. Anasarca.  Nonspecific stranding in the presacral pelvis. Electronically Signed   By: Karen Kays M.D.   On: 10/13/2022 17:17   CT Head Wo Contrast  Result Date: 10/13/2022 CLINICAL DATA:  Head trauma, minor (Age >= 65y); Facial trauma, blunt; Neck trauma (Age >= 65y) EXAM: CT HEAD WITHOUT CONTRAST CT MAXILLOFACIAL WITHOUT CONTRAST CT CERVICAL SPINE WITHOUT CONTRAST TECHNIQUE: Multidetector CT imaging of the head, cervical spine, and maxillofacial structures were performed using the standard protocol without intravenous contrast. Multiplanar CT image reconstructions of the cervical spine and maxillofacial structures were also generated. RADIATION DOSE REDUCTION: This exam was performed according to the departmental dose-optimization  program which includes automated exposure control, adjustment of the mA and/or kV according to patient size and/or use of iterative reconstruction technique. COMPARISON:  None Available. FINDINGS: CT HEAD FINDINGS Brain: No evidence of acute infarction, hemorrhage, hydrocephalus, extra-axial collection. There is a 0.0 x 1.9 x 2.2 cm suprasellar mass with mass effect on the optic chiasm. 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 Sequela of moderate chronic microvascular ischemic change. Ventriculomegaly likely secondary to generalized volume loss. Vascular: No hyperdense vessel or unexpected calcification. Skull: Soft tissue hematoma along the left lateral frontal scalp. No evidence of underlying calvarial fracture. Other:  None. CT MAXILLOFACIAL FINDINGS Osseous: No fracture or mandibular dislocation. No destructive process. Orbits: Negative. No traumatic or inflammatory finding. Sinuses: No middle ear or mastoid effusion. Paranasal sinuses are clear. Bilateral lens replacement. Orbits are otherwise unremarkable. Soft tissues: Soft tissue injury to the posterior auricular soft tissues on the left. CT CERVICAL SPINE FINDINGS Alignment: Straightening of the normal cervical lordosis. Grade 1 anterolisthesis of C3 on C4 and C4 on C5. Skull base and vertebrae: No acute fracture. No primary bone lesion or focal pathologic process. Soft tissues and spinal canal: No prevertebral fluid or swelling. No visible canal hematoma. Disc levels:  No evidence of high-grade spinal canal stenosis. Upper chest: Negative. Other: None IMPRESSION: 1. Soft tissue hematoma along the left lateral frontal scalp. No evidence of underlying calvarial fracture or intracranial injury. 2. No acute facial bone fracture. 3. No acute cervical spine fracture. 4. There is a 0.0 x 1.9 x 2.2 cm suprasellar mass with mass effect on the optic chiasm. 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 Electronically Signed   By: Lorenza Cambridge M.D.   On: 10/13/2022 17:11   CT Cervical Spine Wo Contrast  Result Date: 10/13/2022 CLINICAL DATA:  Head trauma, minor (Age >= 65y); Facial trauma, blunt; Neck trauma (Age >= 65y) EXAM: CT HEAD WITHOUT CONTRAST CT MAXILLOFACIAL WITHOUT CONTRAST CT CERVICAL SPINE WITHOUT CONTRAST TECHNIQUE: Multidetector CT imaging of the head, cervical spine, and maxillofacial structures were performed using the standard protocol without intravenous contrast. Multiplanar CT image reconstructions of the cervical spine and maxillofacial structures were also generated. RADIATION DOSE REDUCTION: This exam was performed  according to the departmental dose-optimization program which includes automated exposure control, adjustment of the mA and/or kV according to patient size and/or use of iterative reconstruction technique. COMPARISON:  None Available. FINDINGS: CT HEAD FINDINGS Brain: No evidence of acute infarction, hemorrhage, hydrocephalus, extra-axial collection. There is a 0.0 x 1.9 x 2.2 cm suprasellar mass with mass effect on the optic chiasm. 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 Sequela of moderate chronic microvascular ischemic change. Ventriculomegaly likely secondary to generalized volume loss. Vascular: No hyperdense vessel or unexpected calcification. Skull: Soft tissue hematoma along the left lateral frontal scalp. No evidence of underlying calvarial fracture. Other: None. CT MAXILLOFACIAL FINDINGS Osseous: No fracture or mandibular dislocation. No destructive process. Orbits: Negative. No traumatic or inflammatory finding. Sinuses: No middle ear or mastoid effusion. Paranasal sinuses are clear. Bilateral lens replacement.  Orbits are otherwise unremarkable. Soft tissues: Soft tissue injury to the posterior auricular soft tissues on the left. CT CERVICAL SPINE FINDINGS Alignment: Straightening of the normal cervical lordosis. Grade 1 anterolisthesis of C3 on C4 and C4 on C5. Skull base and vertebrae: No acute fracture. No primary bone lesion or focal pathologic process. Soft tissues and spinal canal: No prevertebral fluid or swelling. No visible canal hematoma. Disc levels:  No evidence of high-grade spinal canal stenosis. Upper chest: Negative. Other: None IMPRESSION: 1. Soft tissue hematoma along the left lateral frontal scalp. No evidence of underlying calvarial fracture or intracranial injury. 2. No acute facial bone fracture. 3. No acute cervical spine fracture. 4.  There is a 0.0 x 1.9 x 2.2 cm suprasellar mass with mass effect on the optic chiasm. 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 Electronically Signed   By: Lorenza Cambridge M.D.   On: 10/13/2022 17:11   CT Maxillofacial Wo Contrast  Result Date: 10/13/2022 CLINICAL DATA:  Head trauma, minor (Age >= 65y); Facial trauma, blunt; Neck trauma (Age >= 65y) EXAM: CT HEAD WITHOUT CONTRAST CT MAXILLOFACIAL WITHOUT CONTRAST CT CERVICAL SPINE WITHOUT CONTRAST TECHNIQUE: Multidetector CT imaging of the head, cervical spine, and maxillofacial structures were performed using the standard protocol without intravenous contrast. Multiplanar CT image reconstructions of the cervical spine and maxillofacial structures were also generated. RADIATION DOSE REDUCTION: This exam was performed according to the departmental dose-optimization program which includes automated exposure control, adjustment of the mA and/or kV according to patient size and/or use of iterative reconstruction technique. COMPARISON:  None Available. FINDINGS: CT HEAD FINDINGS Brain: No evidence of acute infarction, hemorrhage, hydrocephalus, extra-axial collection. There is a 0.0 x 1.9 x 2.2 cm suprasellar mass with mass effect on the optic chiasm. 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 Sequela of moderate chronic microvascular ischemic change. Ventriculomegaly likely secondary to generalized volume loss. Vascular: No hyperdense vessel or unexpected calcification. Skull: Soft tissue hematoma along the left lateral frontal scalp. No evidence of underlying calvarial fracture. Other: None. CT MAXILLOFACIAL FINDINGS Osseous: No fracture or mandibular dislocation. No destructive  process. Orbits: Negative. No traumatic or inflammatory finding. Sinuses: No middle ear or mastoid effusion. Paranasal sinuses are clear. Bilateral lens replacement. Orbits are otherwise unremarkable. Soft tissues: Soft tissue injury to the posterior auricular soft tissues on the left. CT CERVICAL SPINE FINDINGS Alignment: Straightening of the normal cervical lordosis. Grade 1 anterolisthesis of C3 on C4 and C4 on C5. Skull base and vertebrae: No acute fracture. No primary bone lesion or focal pathologic process. Soft tissues and spinal canal: No prevertebral fluid or swelling. No visible canal hematoma. Disc levels:  No evidence of high-grade spinal canal stenosis. Upper chest: Negative. Other: None IMPRESSION: 1. Soft tissue hematoma along the left lateral frontal scalp. No evidence of underlying calvarial fracture or intracranial injury. 2. No acute facial bone fracture. 3. No acute cervical spine fracture. 4. There is a 0.0 x 1.9 x 2.2 cm suprasellar mass with mass effect on the optic chiasm. 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 Electronically Signed   By: Lorenza Cambridge M.D.   On: 10/13/2022 17:11   DG Pelvis Portable  Result Date: 10/13/2022 CLINICAL DATA:  Fall. EXAM: PORTABLE PELVIS 1-2 VIEWS COMPARISON:  None Available. FINDINGS: There is no evidence of pelvic fracture or diastasis. No pelvic bone lesions are seen. IMPRESSION: Negative. Electronically Signed   By: Lupita Raider M.D.   On: 10/13/2022 16:37   DG Shoulder Left Portable  Result Date: 10/13/2022 CLINICAL DATA:  Fall. EXAM: LEFT SHOULDER COMPARISON:  October 17, 2013. FINDINGS: No definite fracture is noted. Old left rib fractures are noted. There does appear to be some elevation of the distal end of the left clavicle relative to acromion which was present on prior exam suggesting  acromioclavicular joint separation. IMPRESSION: Stable elevation of distal end of left clavicle relative to the acromion suggesting stable old acromioclavicular joint separation. No acute fracture or dislocation is noted. Electronically Signed   By: Lupita Raider M.D.   On: 10/13/2022 16:37   DG Elbow 2 Views Left  Result Date: 10/13/2022 CLINICAL DATA:  Fall. EXAM: LEFT ELBOW - 2 VIEW COMPARISON:  None Available. FINDINGS: There is no evidence of fracture, dislocation, or joint effusion. There is no evidence of arthropathy or other focal bone abnormality. Soft tissues are unremarkable. IMPRESSION: Negative. Electronically Signed   By: Lupita Raider M.D.   On: 10/13/2022 16:35   DG Knee 2 Views Left  Result Date: 10/13/2022 CLINICAL DATA:  Left knee pain after fall. EXAM: LEFT KNEE - 1-2 VIEW COMPARISON:  None Available. FINDINGS: No evidence of fracture, dislocation, or joint effusion. No evidence of arthropathy or other focal bone abnormality. Vascular calcifications are noted. IMPRESSION: No significant abnormality seen in the left knee. Electronically Signed   By: Lupita Raider M.D.   On: 10/13/2022 16:33   DG Hip Unilat W or Wo Pelvis 2-3 Views Left  Result Date: 10/13/2022 CLINICAL DATA:  Status post fall.  Rule out fracture. EXAM: DG HIP (WITH OR WITHOUT PELVIS) 2-3V LEFT COMPARISON:  None Available. FINDINGS: There is no evidence of hip fracture or dislocation. There is cortical irregularity of the superior pubic ramus on the left, obscured by formed stool in the rectum. IMPRESSION: 1. No evidence of hip fracture or dislocation. 2. Cortical irregularity of the superior pubic ramus on the left, obscured by formed stool in the rectum. Consider further evaluation with dedicated pelvic radiograph or CT. Electronically Signed   By: Ted Mcalpine M.D.   On: 10/13/2022 16:32   DG Chest Port 1 View  Result Date: 10/13/2022 CLINICAL DATA:  Trauma. EXAM: PORTABLE CHEST 1 VIEW COMPARISON:   Sep 07, 2022 FINDINGS: Cardiomediastinal silhouette is enlarged. Mediastinal contours appear intact. Possible interstitial pulmonary edema with small bilateral pleural effusions. Osseous structures are without acute abnormality. Soft tissues are grossly normal. IMPRESSION: Possible interstitial pulmonary edema with small bilateral pleural effusions. Electronically Signed   By: Ted Mcalpine M.D.   On: 10/13/2022 16:30    Pertinent labs & imaging results that were available during my care of the patient were reviewed by me and considered in my medical decision making (see MDM for details).  Medications Ordered in ED Medications  aspirin EC tablet 81 mg (81 mg Oral Given 10/14/22 1116)  atorvastatin (LIPITOR) tablet 40 mg (40 mg Oral Given 10/14/22 1116)  furosemide (LASIX) tablet 20 mg (20 mg Oral Not Given 10/14/22 1123)  metoprolol succinate (TOPROL-XL) 24 hr tablet 25 mg (25  mg Oral Given 10/14/22 1116)  traZODone (DESYREL) tablet 25 mg (has no administration in time range)  levothyroxine (SYNTHROID) tablet 100 mcg (100 mcg Oral Given 10/14/22 0943)  senna (SENOKOT) tablet 8.6 mg (has no administration in time range)  acetaminophen (TYLENOL) tablet 1,000 mg (1,000 mg Oral Given 10/14/22 1123)  methocarbamol (ROBAXIN) tablet 500 mg (500 mg Oral Given 10/14/22 0630)    Or  methocarbamol (ROBAXIN) 500 mg in dextrose 5 % 50 mL IVPB ( Intravenous See Alternative 10/14/22 0630)  docusate sodium (COLACE) capsule 100 mg (100 mg Oral Given 10/14/22 1117)  polyethylene glycol (MIRALAX / GLYCOLAX) packet 17 g (has no administration in time range)  ondansetron (ZOFRAN-ODT) disintegrating tablet 4 mg (has no administration in time range)    Or  ondansetron (ZOFRAN) injection 4 mg (has no administration in time range)  metoprolol tartrate (LOPRESSOR) injection 5 mg (has no administration in time range)  hydrALAZINE (APRESOLINE) injection 10 mg (has no administration in time range)  oxyCODONE (Oxy  IR/ROXICODONE) immediate release tablet 5 mg (has no administration in time range)  HYDROmorphone (DILAUDID) injection 0.5 mg (has no administration in time range)  lidocaine (LIDODERM) 5 % 1 patch (1 patch Transdermal Patch Removed 10/14/22 0921)  albuterol (PROVENTIL) (2.5 MG/3ML) 0.083% nebulizer solution 2.5 mg (has no administration in time range)  milk and molasses enema (has no administration in time range)  fentaNYL (SUBLIMAZE) injection 50 mcg (50 mcg Intravenous Given 10/13/22 1708)  Tdap (BOOSTRIX) injection 0.5 mL (0.5 mLs Intramuscular Given 10/13/22 1710)  iohexol (OMNIPAQUE) 350 MG/ML injection 75 mL (75 mLs Intravenous Contrast Given 10/13/22 1657)  lidocaine-EPINEPHrine (XYLOCAINE W/EPI) 2 %-1:200000 (PF) injection 10 mL (10 mLs Intradermal Given 10/13/22 1736)  HYDROcodone-acetaminophen (NORCO/VICODIN) 5-325 MG per tablet 1 tablet (1 tablet Oral Given 10/13/22 1735)  potassium chloride (KLOR-CON) packet 40 mEq (40 mEq Oral Given 10/14/22 1123)                                                                                                                                     Procedures .Critical Care  Performed by: Glendora Score, MD Authorized by: Glendora Score, MD   Critical care provider statement:    Critical care time (minutes):  30   Critical care was necessary to treat or prevent imminent or life-threatening deterioration of the following conditions:  Trauma and respiratory failure   Critical care was time spent personally by me on the following activities:  Development of treatment plan with patient or surrogate, discussions with consultants, evaluation of patient's response to treatment, examination of patient, ordering and review of laboratory studies, ordering and review of radiographic studies, ordering and performing treatments and interventions, pulse oximetry, re-evaluation of patient's condition and review of old charts .Marland KitchenLaceration Repair  Date/Time: 10/14/2022  12:13 PM  Performed by: Glendora Score, MD Authorized by: Glendora Score, MD   Anesthesia:    Anesthesia method:  Local infiltration  Local anesthetic:  Lidocaine 2% WITH epi Laceration details:    Location:  Ear   Ear location:  L ear   Length (cm):  2 Treatment:    Area cleansed with:  Chlorhexidine Skin repair:    Repair method:  Sutures   Suture size:  4-0   Suture material:  Plain gut   Number of sutures:  2 Approximation:    Approximation:  Close Repair type:    Repair type:  Simple Post-procedure details:    Dressing:  Open (no dressing) .Marland KitchenLaceration Repair  Date/Time: 10/14/2022 12:13 PM  Performed by: Glendora Score, MD Authorized by: Glendora Score, MD   Anesthesia:    Anesthesia method:  None Laceration details:    Location:  Face   Face location:  L eyebrow   Length (cm):  1 Skin repair:    Repair method:  Tissue adhesive Approximation:    Approximation:  Close Repair type:    Repair type:  Simple Post-procedure details:    Dressing:  Open (no dressing)   (including critical care time)  Medical Decision Making / ED Course   This patient presents to the ED for concern of fall on blood thinners, this involves an extensive number of treatment options, and is a complaint that carries with it a high risk of complications and morbidity.  The differential diagnosis includes fracture, contusion, hematoma, ligamentous injury, closed head injury, ICH, laceration, intrathoracic injury, intra-abdominal injury  MDM: Patient seen emergency room as a level 2 trauma for fall on blood thinners.  Physical exam reveals a laceration behind the left ear and over the left orbit, tenderness along the left shoulder, elbow, ribs, abdomen, hip and knee.  Laboratory evaluation with a hemoglobin of 9.7 with an MCV of 107.2, INR 1.9, creatinine 1.07 with a BUN of 24, mild hypokalemia 3.1, sodium 132 trauma imaging revealing a left-sided rectus muscle hematoma approximately  9.3 cm with no active extravasation, multiple left-sided rib fractures but is otherwise unremarkable.  Patient initially presenting with normal oxygen saturations on room air but started to become hypoxic here in the emergency department requiring 2 L nasal cannula.  Ear laceration and orbital laceration repaired at bedside and tetanus updated.  Given multiple rib fractures, abdominal wall hematoma and new hypoxia patient will require trauma admission.  Spoke with Dr. Cliffton Asters of trauma who will admit the patient and is requesting routine medical consultation to help manage patient's other comorbidities.  I spoke with the [facility] hospitalist who will assist the trauma team.  Patient then admitted to trauma.   Additional history obtained: -Additional history obtained from son -External records from outside source obtained and reviewed including: Chart review including previous notes, labs, imaging, consultation notes   Lab Tests: -I ordered, reviewed, and interpreted labs.   The pertinent results include:   Labs Reviewed  COMPREHENSIVE METABOLIC PANEL - Abnormal; Notable for the following components:      Result Value   Sodium 132 (*)    Potassium 3.1 (*)    Chloride 97 (*)    Glucose, Bld 110 (*)    BUN 24 (*)    Creatinine, Ser 1.07 (*)    Calcium 8.5 (*)    Total Protein 6.3 (*)    Albumin 3.2 (*)    GFR, Estimated 48 (*)    All other components within normal limits  CBC - Abnormal; Notable for the following components:   RBC 2.93 (*)    Hemoglobin 9.7 (*)    HCT 31.4 (*)  MCV 107.2 (*)    RDW 18.9 (*)    All other components within normal limits  PROTIME-INR - Abnormal; Notable for the following components:   Prothrombin Time 21.6 (*)    INR 1.9 (*)    All other components within normal limits  CBC - Abnormal; Notable for the following components:   RBC 2.72 (*)    Hemoglobin 8.9 (*)    HCT 27.8 (*)    MCV 102.2 (*)    RDW 18.6 (*)    All other components within normal  limits  BASIC METABOLIC PANEL - Abnormal; Notable for the following components:   Sodium 132 (*)    Potassium 3.3 (*)    Chloride 97 (*)    Calcium 8.4 (*)    GFR, Estimated 52 (*)    All other components within normal limits  I-STAT CHEM 8, ED - Abnormal; Notable for the following components:   Sodium 134 (*)    Potassium 3.1 (*)    Chloride 95 (*)    BUN 25 (*)    Creatinine, Ser 1.10 (*)    Glucose, Bld 108 (*)    Calcium, Ion 1.08 (*)    Hemoglobin 10.5 (*)    HCT 31.0 (*)    All other components within normal limits  CBC  SAMPLE TO BLOOD BANK       Imaging Studies ordered: I ordered imaging studies including chest x-ray, hip x-ray, knee x-ray, elbow x-ray, pelvis x-ray, CT head, max face, C-spine, chest abdomen pelvis I independently visualized and interpreted imaging. I agree with the radiologist interpretation   Medicines ordered and prescription drug management: Meds ordered this encounter  Medications   fentaNYL (SUBLIMAZE) injection 50 mcg   Tdap (BOOSTRIX) injection 0.5 mL   iohexol (OMNIPAQUE) 350 MG/ML injection 75 mL   lidocaine-EPINEPHrine (XYLOCAINE W/EPI) 2 %-1:200000 (PF) injection 10 mL   HYDROcodone-acetaminophen (NORCO/VICODIN) 5-325 MG per tablet 1 tablet   aspirin EC tablet 81 mg    Swallow whole.     atorvastatin (LIPITOR) tablet 40 mg   furosemide (LASIX) tablet 20 mg   metoprolol succinate (TOPROL-XL) 24 hr tablet 25 mg   traZODone (DESYREL) tablet 25 mg   levothyroxine (SYNTHROID) tablet 100 mcg   senna (SENOKOT) tablet 8.6 mg   DISCONTD: potassium chloride (KLOR-CON M) CR tablet 10 mEq   acetaminophen (TYLENOL) tablet 1,000 mg   OR Linked Order Group    methocarbamol (ROBAXIN) tablet 500 mg    methocarbamol (ROBAXIN) 500 mg in dextrose 5 % 50 mL IVPB   docusate sodium (COLACE) capsule 100 mg   polyethylene glycol (MIRALAX / GLYCOLAX) packet 17 g   OR Linked Order Group    ondansetron (ZOFRAN-ODT) disintegrating tablet 4 mg     ondansetron (ZOFRAN) injection 4 mg   metoprolol tartrate (LOPRESSOR) injection 5 mg   hydrALAZINE (APRESOLINE) injection 10 mg   oxyCODONE (Oxy IR/ROXICODONE) immediate release tablet 5 mg   HYDROmorphone (DILAUDID) injection 0.5 mg   lidocaine (LIDODERM) 5 % 1 patch   DISCONTD: potassium chloride (KLOR-CON) packet 40 mEq   DISCONTD: potassium chloride 10 mEq in 100 mL IVPB   albuterol (PROVENTIL) (2.5 MG/3ML) 0.083% nebulizer solution 2.5 mg   milk and molasses enema   potassium chloride (KLOR-CON) packet 40 mEq    -I have reviewed the patients home medicines and have made adjustments as needed  Critical interventions Trauma activation and evaluation, supplemental oxygen, laceration repairs  Consultations Obtained: I requested consultation with the trauma  surgeon on-call Dr. Cliffton Asters,  and discussed lab and imaging findings as well as pertinent plan - they recommend: Trauma admission   Cardiac Monitoring: The patient was maintained on a cardiac monitor.  I personally viewed and interpreted the cardiac monitored which showed an underlying rhythm of: NSR  Social Determinants of Health:  Factors impacting patients care include: none   Reevaluation: After the interventions noted above, I reevaluated the patient and found that they have :improved  Co morbidities that complicate the patient evaluation  Past Medical History:  Diagnosis Date   Arthritis    Bacterial pneumonia, unspecified    Cancer , breast    Candidiasis of mouth    Chronic airway obstruction, not elsewhere classified    son not aware of this   Encephalitis 1961   Family history of adverse reaction to anesthesia    son has nausea   Heart murmur    has had it for years; very mild AS 05/2015 echo   Hyposmolality and/or hyponatremia    Lumbago    Obstructive chronic bronchitis with exacerbation (HCC)    Other abnormal glucose    Pain in joint, shoulder region    Palpitations    Persistent disorder of  initiating or maintaining sleep    Restless leg syndrome    Unspecified essential hypertension    Unspecified hypothyroidism    Unspecified viral infection, in conditions classified elsewhere and of unspecified site    Unspecified vitamin D deficiency    Urinary tract infection, site not specified       Dispostion: I considered admission for this patient, and due to multiple rib fractures and hypoxia patient require hospital admission     Final Clinical Impression(s) / ED Diagnoses Final diagnoses:  Closed fracture of multiple ribs of left side, initial encounter     @PCDICTATION @    Glendora Score, MD 10/14/22 1219

## 2022-10-13 NOTE — ED Notes (Signed)
Pt in CT.

## 2022-10-14 DIAGNOSIS — I5022 Chronic systolic (congestive) heart failure: Secondary | ICD-10-CM

## 2022-10-14 DIAGNOSIS — I10 Essential (primary) hypertension: Secondary | ICD-10-CM

## 2022-10-14 DIAGNOSIS — S2242XA Multiple fractures of ribs, left side, initial encounter for closed fracture: Secondary | ICD-10-CM | POA: Diagnosis not present

## 2022-10-14 DIAGNOSIS — E039 Hypothyroidism, unspecified: Secondary | ICD-10-CM

## 2022-10-14 DIAGNOSIS — J449 Chronic obstructive pulmonary disease, unspecified: Secondary | ICD-10-CM

## 2022-10-14 DIAGNOSIS — E876 Hypokalemia: Secondary | ICD-10-CM

## 2022-10-14 LAB — CBC
HCT: 27.8 % — ABNORMAL LOW (ref 36.0–46.0)
HCT: 30.5 % — ABNORMAL LOW (ref 36.0–46.0)
Hemoglobin: 8.9 g/dL — ABNORMAL LOW (ref 12.0–15.0)
Hemoglobin: 9.7 g/dL — ABNORMAL LOW (ref 12.0–15.0)
MCH: 32.7 pg (ref 26.0–34.0)
MCH: 33.4 pg (ref 26.0–34.0)
MCHC: 32 g/dL (ref 30.0–36.0)
MCHC: 31.8 g/dL (ref 30.0–36.0)
MCV: 102.2 fL — ABNORMAL HIGH (ref 80.0–100.0)
MCV: 105.2 fL — ABNORMAL HIGH (ref 80.0–100.0)
Platelets: 175 10*3/uL (ref 150–400)
Platelets: 177 10*3/uL (ref 150–400)
RBC: 2.72 MIL/uL — ABNORMAL LOW (ref 3.87–5.11)
RBC: 2.9 MIL/uL — ABNORMAL LOW (ref 3.87–5.11)
RDW: 18.6 % — ABNORMAL HIGH (ref 11.5–15.5)
RDW: 19 % — ABNORMAL HIGH (ref 11.5–15.5)
WBC: 7.1 10*3/uL (ref 4.0–10.5)
WBC: 6.5 10*3/uL (ref 4.0–10.5)
nRBC: 0 % (ref 0.0–0.2)
nRBC: 0 % (ref 0.0–0.2)

## 2022-10-14 LAB — BASIC METABOLIC PANEL
Anion gap: 9 (ref 5–15)
BUN: 22 mg/dL (ref 8–23)
CO2: 26 mmol/L (ref 22–32)
Calcium: 8.4 mg/dL — ABNORMAL LOW (ref 8.9–10.3)
Chloride: 97 mmol/L — ABNORMAL LOW (ref 98–111)
Creatinine, Ser: 0.99 mg/dL (ref 0.44–1.00)
GFR, Estimated: 52 mL/min — ABNORMAL LOW (ref >60)
Glucose, Bld: 84 mg/dL (ref 70–99)
Potassium: 3.3 mmol/L — ABNORMAL LOW (ref 3.5–5.1)
Sodium: 132 mmol/L — ABNORMAL LOW (ref 135–145)

## 2022-10-14 MED ORDER — TAMSULOSIN HCL 0.4 MG PO CAPS
0.4000 mg | ORAL_CAPSULE | Freq: Every day | ORAL | Status: DC
  Administered 2022-10-14 – 2022-10-24 (×11): 0.4 mg via ORAL
  Filled 2022-10-14 (×11): qty 1

## 2022-10-14 MED ORDER — POTASSIUM CHLORIDE 20 MEQ PO PACK
40.0000 meq | PACK | Freq: Once | ORAL | Status: AC
  Administered 2022-10-14: 40 meq via ORAL
  Filled 2022-10-14: qty 2

## 2022-10-14 MED ORDER — POTASSIUM CHLORIDE 10 MEQ/100ML IV SOLN
10.0000 meq | INTRAVENOUS | Status: DC
  Administered 2022-10-14: 10 meq via INTRAVENOUS
  Filled 2022-10-14: qty 100

## 2022-10-14 MED ORDER — ALBUTEROL SULFATE (2.5 MG/3ML) 0.083% IN NEBU: 2.5000 mg | INHALATION_SOLUTION | Freq: Four times a day (QID) | RESPIRATORY_TRACT | Status: DC | PRN

## 2022-10-14 MED ORDER — MILK AND MOLASSES ENEMA
1.0000 | Freq: Once | RECTAL | Status: AC
  Administered 2022-10-14: 240 mL via RECTAL
  Filled 2022-10-14: qty 240

## 2022-10-14 NOTE — Evaluation (Signed)
Occupational Therapy Evaluation Patient Details Name: Diana [name] MRN: [id number] DOB: [birth year] Today's Date: 10/14/2022   History of Present Illness Pt is a 89 year old admitted 10/13/22 after mechanical fall. Dx with rib fractures L 2-5, scalp laceration and hematoma and small R PE. PMH includes chronic hearing loss, progressive bilateral lower extremity weakness, atrial fibrillation, essential hypertension -recently discharged from [facility]/2022 with cellulitis of the lower extremity.  She was discharged to a rehab facility where she was for a few weeks.  She subsequently was transferred to ALF 6/26 where she fell in the bathroom.   Clinical Impression   Pt is typically ambulatory for short distances with RW, WC for longer or community mobility. She is generally supervision for bathing and dressing, mod I for grooming and independent in feeding. Today she is min guard assist for bed mobility and transfers with RW requiring A for boost and vc for safe hand placement. Pt continent of bladder and able to perform peri care while seated on the commode. Pt does present with overall decreased balance, activity tolerance. At this time recommend return to ALF with supervision while in the bathroom for toileting AND bathing and follow up with facility OT/HHOT to maximize safety and independence in ADL and functional transfers in natural environment. OT will continue to follow acutely with next session to focus on OOB activity, energy conservation strategies.       Recommendations for follow up therapy are one component of a multi-disciplinary discharge planning process, led by the attending physician.  Recommendations may be updated based on patient status, additional functional criteria and insurance authorization.   Assistance Recommended at Discharge Intermittent Supervision/Assistance (in bathroom esp)  Patient can return home with the following A little help with walking and/or transfers;A lot of  help with bathing/dressing/bathroom    Functional Status Assessment  Patient has had a recent decline in their functional status and demonstrates the ability to make significant improvements in function in a reasonable and predictable amount of time.  Equipment Recommendations  None recommended by OT (Pt has appropriate DME)    Recommendations for Other Services PT consult     Precautions / Restrictions Precautions Precautions: Fall Restrictions Weight Bearing Restrictions: No      Mobility Bed Mobility Overal bed mobility: Needs Assistance Bed Mobility: Rolling, Sidelying to Sit Rolling: Min guard Sidelying to sit: Min assist       General bed mobility comments: min A to bring hips EOB using pad to assist    Transfers Overall transfer level: Needs assistance Equipment used: Rolling walker (2 wheels) Transfers: Sit to/from Stand Sit to Stand: Min guard, Min assist           General transfer comment: initially assist to boost, vc for safe hand placement      Balance Overall balance assessment: Mild deficits observed, not formally tested                                         ADL either performed or assessed with clinical judgement   ADL Overall ADL's : Needs assistance/impaired Eating/Feeding: NPO   Grooming: Set up;Sitting Grooming Details (indicate cue type and reason): decreased standing tolerance Upper Body Bathing: Moderate assistance   Lower Body Bathing: Moderate assistance   Upper Body Dressing : Set up   Lower Body Dressing: Moderate assistance   Toilet Transfer: Hydrographic surveyor Details (indicate cue  type and reason): vc for safe hand placement Toileting- Clothing Manipulation and Hygiene: Min guard Toileting - Clothing Manipulation Details (indicate cue type and reason): front peri care seated on commode Tub/ Shower Transfer: Minimal assistance;Walk-in shower (lip for water - no zero entry)   Functional mobility  during ADLs: Min guard;Rolling walker (2 wheels)       Vision Ability to See in Adequate Light: 0 Adequate Patient Visual Report: No change from baseline       Perception     Praxis      Pertinent Vitals/Pain Pain Assessment Pain Assessment: Faces Faces Pain Scale: Hurts a little bit Pain Location: L arm (IV site) and ribs Pain Descriptors / Indicators: Discomfort, Sore Pain Intervention(s): Limited activity within patient's tolerance, Monitored during session, Repositioned (RN assessed and removed IV)     Hand Dominance Right   Extremity/Trunk Assessment Upper Extremity Assessment Upper Extremity Assessment: LUE deficits/detail LUE Deficits / Details: decreased elevation due to pain in ribs, also infiltrated IV, so overall decreased ROM due to pain - limited evaluation but functionally using for transfers and ADL LUE Coordination: decreased gross motor   Lower Extremity Assessment Lower Extremity Assessment: Defer to PT evaluation   Cervical / Trunk Assessment Cervical / Trunk Assessment: Kyphotic   Communication Communication Communication: HOH (VERY - left ear better)   Cognition Arousal/Alertness: Awake/alert Behavior During Therapy: WFL for tasks assessed/performed Overall Cognitive Status: Within Functional Limits for tasks assessed (son does assist with money management and medicine management at baseline)                                       General Comments  HR up to 141, SpO2 down to 89 with activity, returns >90% in seated position <1 min; son present throughout, very supportive    Exercises     Shoulder Instructions      Home Living Family/patient expects to be discharged to:: Assisted living ([facility])                             Home Equipment: Rolling Walker (2 wheels);Grab bars - toilet;Grab bars - tub/shower;Shower seat - built in   Additional Comments: just moved in, had only been there an hour when she  fell      Prior Functioning/Environment Prior Level of Function : Needs assist             Mobility Comments: Pt short ambulation with RW - recent dc from SNF and just moved into ALF - planning on working with therapy, community or long distances at [facility] level ADLs Comments: dependent in IADL, grooming supervision, assist with bathing/dressing, mod I for bathroom- but experienced fall when attempting to pull up underwear/bottoms        OT Problem List: Decreased activity tolerance;Decreased safety awareness;Cardiopulmonary status limiting activity;Obesity;Pain      OT Treatment/Interventions: Self-care/ADL training;DME and/or AE instruction;Energy conservation;Therapeutic activities;Patient/family education;Balance training    OT Goals(Current goals can be found in the care plan section) Acute Rehab OT Goals Patient Stated Goal: decrease pain OT Goal Formulation: With patient/family Time For Goal Achievement: 10/28/22 Potential to Achieve Goals: Good ADL Goals Pt Will Perform Grooming: with supervision;standing Pt Will Perform Upper Body Dressing: with set-up Pt Will Perform Lower Body Dressing: with min guard assist Pt Will Transfer to Toilet: with supervision;ambulating Pt Will Perform Toileting -  Clothing Manipulation and hygiene: with supervision;sit to/from stand Additional ADL Goal #1: Pt will verbalize at least 3 energy conservation strategies for ADL with no cues  OT Frequency: Min 2X/week    Co-evaluation              AM-PAC OT "6 Clicks" Daily Activity     Outcome Measure Help from another person eating meals?: Total (NPO) Help from another person taking care of personal grooming?: A Little Help from another person toileting, which includes using toliet, bedpan, or urinal?: A Little Help from another person bathing (including washing, rinsing, drying)?: A Lot Help from another person to put on and taking off regular upper body clothing?: A Little Help from  another person to put on and taking off regular lower body clothing?: A Lot 6 Click Score: 14   End of Session Equipment Utilized During Treatment: Gait belt;Rolling walker (2 wheels) Nurse Communication: Mobility status;Precautions  Activity Tolerance: Patient tolerated treatment well Patient left: Other (comment) (walking with PT)  OT Visit Diagnosis: Unsteadiness on feet (R26.81);Muscle weakness (generalized) (M62.81);Pain Pain - Right/Left: Left Pain - part of body:  (ribs, head)                Time: 1000-1020 OT Time Calculation (min): 20 min Charges:  OT General Charges $OT Visit: 1 Visit OT Evaluation $OT Eval Moderate Complexity: 1 Mod  Nyoka Cowden OTR/L Acute Rehabilitation Services Office: [phone]   Evern Bio [facility] 10/14/2022, 11:19 AM

## 2022-10-14 NOTE — Evaluation (Signed)
Physical Therapy Evaluation Patient Details Name: Diana [name] MRN: [id number] DOB: 1923-06-28 Today's Date: 10/14/2022  History of Present Illness  Pt is a 89 year old admitted 10/13/22 after mechanical fall. Dx with rib fractures L 2-5, scalp laceration and hematoma and small R PE. PMH includes chronic hearing loss, progressive bilateral lower extremity weakness, atrial fibrillation, essential hypertension -recently discharged from [facility] 09/06/2022 with cellulitis of the lower extremity.  She was discharged to a rehab facility where she was for a few weeks.  She subsequently was transferred to ALF 6/26 where she fell in the bathroom.  Clinical Impression  Patient presents with mobility currently limited by pain and elevated HR (a-fib) and fatigue and decreased balance.  Son present and reports she was doing everything independent at home prior to her leg cellulitis which caused decline requiring SNF stay and just getting her into ALF when she fell.  Feel she is mobilizing well despite the injuries from the fall and should be able to return to ALF with initial supervision for ADL's and HHPT/OT to improve independence as she transitions to the ALF environment and for balance training.  She did have HR elevated on 141 so limited to in room mobility today.  Resting HR in 110's.  PT will follow up during acute stay.      Recommendations for follow up therapy are one component of a multi-disciplinary discharge planning process, led by the attending physician.  Recommendations may be updated based on patient status, additional functional criteria and insurance authorization.  Follow Up Recommendations       Assistance Recommended at Discharge Intermittent Supervision/Assistance  Patient can return home with the following  Assistance with cooking/housework;Direct supervision/assist for medications management;Assist for transportation;Help with stairs or ramp for entrance;A little help with  bathing/dressing/bathroom    Equipment Recommendations None recommended by PT  Recommendations for Other Services       Functional Status Assessment Patient has had a recent decline in their functional status and demonstrates the ability to make significant improvements in function in a reasonable and predictable amount of time.     Precautions / Restrictions Precautions Precautions: Fall Restrictions Weight Bearing Restrictions: No      Mobility  Bed Mobility               General bed mobility comments: up on EOB with OT    Transfers Overall transfer level: Needs assistance Equipment used: Rolling walker (2 wheels) Transfers: Sit to/from Stand Sit to Stand: Min guard, Min assist           General transfer comment: initially assist to boost, vc for safe hand placement    Ambulation/Gait Ambulation/Gait assistance: Supervision, Min guard Gait Distance (Feet): 30 Feet Assistive device: Rolling walker (2 wheels) Gait Pattern/deviations: Step-through pattern, Trunk flexed, Decreased stride length       General Gait Details: forward flexed, initially minguard for safety, then able to walk without support in room to door then to recliner, HR max 141  Stairs            Wheelchair Mobility    Modified Rankin (Stroke Patients Only)       Balance Overall balance assessment: Mild deficits observed, not formally tested                                           Pertinent Vitals/Pain Pain Assessment Pain Assessment: Faces Faces  Pain Scale: Hurts little more Pain Location: L arm (IV site) and ribs Pain Descriptors / Indicators: Discomfort, Sore Pain Intervention(s): Limited activity within patient's tolerance, Monitored during session, Repositioned    Home Living Family/patient expects to be discharged to:: Assisted living (just moved into [facility])                 Home Equipment: Rolling Walker (2 wheels);Grab bars -  toilet;Grab bars - tub/shower;Shower seat - built in Additional Comments: just moved in, had only been there an hour when she fell    Prior Function Prior Level of Function : Needs assist             Mobility Comments: Pt short ambulation with RW - recent dc from SNF and just moved into ALF - planning on working with therapy, community or long distances at [facility] level ADLs Comments: dependent in IADL, grooming supervision, assist with bathing/dressing, mod I for bathroom- but experienced fall when attempting to pull up underwear/bottoms     Hand Dominance   Dominant Hand: Right    Extremity/Trunk Assessment   Upper Extremity Assessment Upper Extremity Assessment: Defer to OT evaluation LUE Deficits / Details: decreased elevation due to pain in ribs, also infiltrated IV, so overall decreased ROM due to pain - limited evaluation but functionally using for transfers and ADL LUE Coordination: decreased gross motor    Lower Extremity Assessment Lower Extremity Assessment: Generalized weakness    Cervical / Trunk Assessment Cervical / Trunk Assessment: Kyphotic  Communication   Communication: HOH (L ear better)  Cognition Arousal/Alertness: Awake/alert Behavior During Therapy: WFL for tasks assessed/performed Overall Cognitive Status: Within Functional Limits for tasks assessed                                          General Comments General comments (skin integrity, edema, etc.): HR as high as 141 and SpO2 89% with mobility, back to >90% in seated position HR in 110's'; son present and very supportive; hopeful she can return to [facility]    Exercises     Assessment/Plan    PT Assessment Patient needs continued PT services  PT Problem List Pain;Decreased mobility;Decreased activity tolerance;Cardiopulmonary status limiting activity;Decreased balance       PT Treatment Interventions DME instruction;Functional mobility training;Balance  training;Patient/family education;Therapeutic activities;Gait training;Therapeutic exercise    PT Goals (Current goals can be found in the Care Plan section)  Acute Rehab PT Goals Patient Stated Goal: to return to ALF PT Goal Formulation: With patient/family Time For Goal Achievement: 10/28/22 Potential to Achieve Goals: Good    Frequency Min 4X/week     Co-evaluation               AM-PAC PT "6 Clicks" Mobility  Outcome Measure Help needed turning from your back to your side while in a flat bed without using bedrails?: A Little Help needed moving from lying on your back to sitting on the side of a flat bed without using bedrails?: A Little Help needed moving to and from a bed to a chair (including a wheelchair)?: A Little Help needed standing up from a chair using your arms (e.g., wheelchair or bedside chair)?: A Little Help needed to walk in hospital room?: A Little Help needed climbing 3-5 steps with a railing? : Total 6 Click Score: 16    End of Session Equipment Utilized During Treatment: Gait  belt Activity Tolerance: Patient tolerated treatment well Patient left: in chair;with chair alarm set;with call bell/phone within reach;with family/visitor present Nurse Communication: Mobility status PT Visit Diagnosis: Other abnormalities of gait and mobility (R26.89);History of falling (Z91.81)    Time: 2725-3664 PT Time Calculation (min) (ACUTE ONLY): 16 min   Charges:   PT Evaluation $PT Eval Moderate Complexity: 1 Mod          Cyndi Graciana Sessa, PT Acute Rehabilitation Services [phone] 10/14/2022   [name] 10/14/2022, 1:53 PM

## 2022-10-14 NOTE — Plan of Care (Signed)
  Problem: Pain Managment: Goal: General experience of comfort will improve Outcome: Progressing   Problem: Safety: Goal: Ability to remain free from injury will improve Outcome: Progressing   Problem: Skin Integrity: Goal: Risk for impaired skin integrity will decrease Outcome: Progressing   

## 2022-10-14 NOTE — Progress Notes (Signed)
[facility] Progress Note     Subjective: CC:  HOH - spoke to her by yelling in L ear Denies pain currently.  Son at bedside confirms the patient takes eliquis at home   Objective: Vital signs in last 24 hours: Temp:  [97.4 F (36.3 C)-97.9 F (36.6 C)] 97.4 F (36.3 C) (06/27 0756) Pulse Rate:  [60-101] 92 (06/27 0756) Resp:  [10-19] 16 (06/27 0756) BP: (111-129)/(55-84) 111/55 (06/27 0756) SpO2:  [92 %-99 %] 94 % (06/27 0756) Weight:  [74.4 kg] 74.4 kg (06/26 1632) Last BM Date : 10/13/22  Intake/Output from previous day: No intake/output data recorded. Intake/Output this shift: No intake/output data recorded.  PE: Gen:  Alert, elderly female, NAD HEENT: L peri-orbital ecchymosis  Card:  Regular rate and rhythm Pulm:  Normal effort on [place], mild expiratory wheezing Abd: Soft, non-tender, non-distended, left hemi-abdomen with no visible hematoma but palpable firmness/fullness in left hemi-abdomen  Skin: warm and dry, no rashes  Psych: A&Ox3   Lab Results:  Recent Labs    10/13/22 1611 10/13/22 1614 10/14/22 0348  WBC 6.1  --  7.1  HGB 9.7* 10.5* 8.9*  HCT 31.4* 31.0* 27.8*  PLT 183  --  175   BMET Recent Labs    10/13/22 1611 10/13/22 1614 10/14/22 0348  NA 132* 134* 132*  K 3.1* 3.1* 3.3*  CL 97* 95* 97*  CO2 24  --  26  GLUCOSE 110* 108* 84  BUN 24* 25* 22  CREATININE 1.07* 1.10* 0.99  CALCIUM 8.5*  --  8.4*   PT/INR Recent Labs    10/13/22 1611  LABPROT 21.6*  INR 1.9*   CMP     Component Value Date/Time   NA 132 (L) 10/14/2022 0348   NA 136 (A) 07/02/2015 0000   K 3.3 (L) 10/14/2022 0348   CL 97 (L) 10/14/2022 0348   CO2 26 10/14/2022 0348   GLUCOSE 84 10/14/2022 0348   BUN 22 10/14/2022 0348   BUN 16 07/02/2015 0000   CREATININE 0.99 10/14/2022 0348   CREATININE 1.27 (H) 08/27/2022 1609   CALCIUM 8.4 (L) 10/14/2022 0348   PROT 6.3 (L) 10/13/2022 1611   ALBUMIN 3.2 (L) 10/13/2022 1611   AST 21 10/13/2022 1611    ALT 15 10/13/2022 1611   ALKPHOS 86 10/13/2022 1611   BILITOT 1.1 10/13/2022 1611   GFRNONAA 52 (L) 10/14/2022 0348   GFRAA >60 01/10/2017 1248   Lipase  No results found for: "LIPASE"     Studies/Results: CT CHEST ABDOMEN PELVIS W CONTRAST  Result Date: 10/13/2022 CLINICAL DATA:  Pain after fall.  On Eliquis EXAM: CT CHEST, ABDOMEN, AND PELVIS WITH CONTRAST TECHNIQUE: Multidetector CT imaging of the chest, abdomen and pelvis was performed following the standard protocol during bolus administration of intravenous contrast. RADIATION DOSE REDUCTION: This exam was performed according to the departmental dose-optimization program which includes automated exposure control, adjustment of the mA and/or kV according to patient size and/or use of iterative reconstruction technique. CONTRAST:  75mL OMNIPAQUE IOHEXOL 350 MG/ML SOLN COMPARISON:  None Available. FINDINGS: CT CHEST FINDINGS Cardiovascular: Heart is enlarged particularly the right atrium. No pericardial effusion. There are calcifications in the area of the aortic valve. The thoracic aorta overall has a normal course and caliber with some scattered vascular calcifications. Coronary artery calcifications are seen. Is also significant calcifications along the great vessels. No mediastinal hematoma. Mediastinum/Nodes: No specific abnormal lymph node enlargement identified in the axillary regions, hilum or mediastinum. Normal caliber  thoracic esophagus. Heterogeneous thyroid. Lungs/Pleura: There is a small right pleural effusion. Trace left. Breathing motion seen throughout the examination. There are some areas of bandlike changes along the lung bases likely scar or atelectasis. There are some ill-defined areas of ground-glass in both lungs as well. Please correlate for any evidence of air trapping. Mild bronchial wall thickening identified in several locations. No pneumothorax. Musculoskeletal: Scattered degenerative changes along the spine.  Degenerative changes of the shoulders. Fractures involving the anterior aspect of the left second, third, fourth, fifth ribs. Based on appearance these are age-indeterminate. Please correlate to point tenderness. CT ABDOMEN PELVIS FINDINGS Hepatobiliary: Contrast is in the late arterial phase. Grossly preserved hepatic parenchyma. Dependent stone in the nondilated gallbladder. Patent portal vein. Pancreas: Unremarkable. No pancreatic ductal dilatation or surrounding inflammatory changes. Spleen: Normal in size without focal abnormality. Adrenals/Urinary Tract: Slight thickening of the adrenal glands, nonspecific. Malrotated right kidney. Mild bilateral renal atrophy. No enhancing renal mass or collecting system dilatation. Distended urinary bladder. Stomach/Bowel: No oral contrast. Significant stool in the rectum. Scattered colonic diverticula. No large bowel or small bowel dilatation. Stomach is relatively collapsed. Small bowel is nondilated. No free air or free fluid. Vascular/Lymphatic: Aortic atherosclerosis. No enlarged abdominal or pelvic lymph nodes. Reproductive: Uterus and bilateral adnexa are unremarkable. Other: Nonspecific presacral fat stranding. Diffuse anasarca identified as well. No free intra-air or free fluid. Musculoskeletal: Left-sided rectus muscle hematoma identified. This extends cephalocaudal for proximally 9 cm. Transverse dimensions of 9.3 by 4.4 cm. No active extravasation. Curvature and degenerative changes along the spine. Degenerative changes of the pelvis. IMPRESSION: Left-sided rectus muscle hematoma measuring up to 9.3 cm. No active extravasation. Multiple left-sided anterior possibly acute rib fractures. No underlying pneumothorax. Small right pleural effusion.  Tiny left Enlarged heart. Colonic diverticulosis. Significant stool in the rectum. No bowel obstruction, free air or fluid collection. Gallstone. Anasarca.  Nonspecific stranding in the presacral pelvis. Electronically  Signed   By: Karen Kays M.D.   On: 10/13/2022 17:17   CT Head Wo Contrast  Result Date: 10/13/2022 CLINICAL DATA:  Head trauma, minor (Age >= 65y); Facial trauma, blunt; Neck trauma (Age >= 65y) EXAM: CT HEAD WITHOUT CONTRAST CT MAXILLOFACIAL WITHOUT CONTRAST CT CERVICAL SPINE WITHOUT CONTRAST TECHNIQUE: Multidetector CT imaging of the head, cervical spine, and maxillofacial structures were performed using the standard protocol without intravenous contrast. Multiplanar CT image reconstructions of the cervical spine and maxillofacial structures were also generated. RADIATION DOSE REDUCTION: This exam was performed according to the departmental dose-optimization program which includes automated exposure control, adjustment of the mA and/or kV according to patient size and/or use of iterative reconstruction technique. COMPARISON:  None Available. FINDINGS: CT HEAD FINDINGS Brain: No evidence of acute infarction, hemorrhage, hydrocephalus, extra-axial collection. There is a 0.0 x 1.9 x 2.2 cm suprasellar mass with mass effect on the optic chiasm. 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 Sequela of moderate chronic microvascular ischemic change. Ventriculomegaly likely secondary to generalized volume loss. Vascular: No hyperdense vessel or unexpected calcification. Skull: Soft tissue hematoma along the left lateral frontal scalp. No evidence of underlying calvarial fracture. Other: None. CT MAXILLOFACIAL FINDINGS Osseous: No fracture or mandibular dislocation. No destructive process. Orbits: Negative. No traumatic or inflammatory finding. Sinuses: No middle ear or mastoid effusion. Paranasal sinuses are clear. Bilateral lens replacement. Orbits are otherwise unremarkable. Soft  tissues: Soft tissue injury to the posterior auricular soft tissues on the left. CT CERVICAL  SPINE FINDINGS Alignment: Straightening of the normal cervical lordosis. Grade 1 anterolisthesis of C3 on C4 and C4 on C5. Skull base and vertebrae: No acute fracture. No primary bone lesion or focal pathologic process. Soft tissues and spinal canal: No prevertebral fluid or swelling. No visible canal hematoma. Disc levels:  No evidence of high-grade spinal canal stenosis. Upper chest: Negative. Other: None IMPRESSION: 1. Soft tissue hematoma along the left lateral frontal scalp. No evidence of underlying calvarial fracture or intracranial injury. 2. No acute facial bone fracture. 3. No acute cervical spine fracture. 4. There is a 0.0 x 1.9 x 2.2 cm suprasellar mass with mass effect on the optic chiasm. 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 Electronically Signed   By: Lorenza Cambridge M.D.   On: 10/13/2022 17:11   CT Cervical Spine Wo Contrast  Result Date: 10/13/2022 CLINICAL DATA:  Head trauma, minor (Age >= 65y); Facial trauma, blunt; Neck trauma (Age >= 65y) EXAM: CT HEAD WITHOUT CONTRAST CT MAXILLOFACIAL WITHOUT CONTRAST CT CERVICAL SPINE WITHOUT CONTRAST TECHNIQUE: Multidetector CT imaging of the head, cervical spine, and maxillofacial structures were performed using the standard protocol without intravenous contrast. Multiplanar CT image reconstructions of the cervical spine and maxillofacial structures were also generated. RADIATION DOSE REDUCTION: This exam was performed according to the departmental dose-optimization program which includes automated exposure control, adjustment of the mA and/or kV according to patient size and/or use of iterative reconstruction technique. COMPARISON:  None Available. FINDINGS: CT HEAD FINDINGS Brain: No evidence of acute infarction, hemorrhage, hydrocephalus, extra-axial collection. There is a 0.0 x 1.9 x 2.2 cm suprasellar  mass with mass effect on the optic chiasm. 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 Sequela of moderate chronic microvascular ischemic change. Ventriculomegaly likely secondary to generalized volume loss. Vascular: No hyperdense vessel or unexpected calcification. Skull: Soft tissue hematoma along the left lateral frontal scalp. No evidence of underlying calvarial fracture. Other: None. CT MAXILLOFACIAL FINDINGS Osseous: No fracture or mandibular dislocation. No destructive process. Orbits: Negative. No traumatic or inflammatory finding. Sinuses: No middle ear or mastoid effusion. Paranasal sinuses are clear. Bilateral lens replacement. Orbits are otherwise unremarkable. Soft tissues: Soft tissue injury to the posterior auricular soft tissues on the left. CT CERVICAL SPINE FINDINGS Alignment: Straightening of the normal cervical lordosis. Grade 1 anterolisthesis of C3 on C4 and C4 on C5. Skull base and vertebrae: No acute fracture. No primary bone lesion or focal pathologic process. Soft tissues and spinal canal: No prevertebral fluid or swelling. No visible canal hematoma. Disc levels:  No evidence of high-grade spinal canal stenosis. Upper chest: Negative. Other: None IMPRESSION: 1. Soft tissue hematoma along the left lateral frontal scalp. No evidence of underlying calvarial fracture or intracranial injury. 2. No acute facial bone fracture. 3. No acute cervical spine fracture. 4. There is a 0.0 x 1.9 x 2.2 cm suprasellar mass with mass effect on the optic chiasm. 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 Electronically Signed  By: Lorenza Cambridge M.D.   On: 10/13/2022 17:11   CT Maxillofacial Wo Contrast  Result Date:  10/13/2022 CLINICAL DATA:  Head trauma, minor (Age >= 65y); Facial trauma, blunt; Neck trauma (Age >= 65y) EXAM: CT HEAD WITHOUT CONTRAST CT MAXILLOFACIAL WITHOUT CONTRAST CT CERVICAL SPINE WITHOUT CONTRAST TECHNIQUE: Multidetector CT imaging of the head, cervical spine, and maxillofacial structures were performed using the standard protocol without intravenous contrast. Multiplanar CT image reconstructions of the cervical spine and maxillofacial structures were also generated. RADIATION DOSE REDUCTION: This exam was performed according to the departmental dose-optimization program which includes automated exposure control, adjustment of the mA and/or kV according to patient size and/or use of iterative reconstruction technique. COMPARISON:  None Available. FINDINGS: CT HEAD FINDINGS Brain: No evidence of acute infarction, hemorrhage, hydrocephalus, extra-axial collection. There is a 0.0 x 1.9 x 2.2 cm suprasellar mass with mass effect on the optic chiasm. 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 Sequela of moderate chronic microvascular ischemic change. Ventriculomegaly likely secondary to generalized volume loss. Vascular: No hyperdense vessel or unexpected calcification. Skull: Soft tissue hematoma along the left lateral frontal scalp. No evidence of underlying calvarial fracture. Other: None. CT MAXILLOFACIAL FINDINGS Osseous: No fracture or mandibular dislocation. No destructive process. Orbits: Negative. No traumatic or inflammatory finding. Sinuses: No middle ear or mastoid effusion. Paranasal sinuses are clear. Bilateral lens replacement. Orbits are otherwise unremarkable. Soft tissues: Soft tissue injury to the posterior auricular soft tissues on the left. CT CERVICAL SPINE FINDINGS Alignment: Straightening of the normal cervical lordosis. Grade 1  anterolisthesis of C3 on C4 and C4 on C5. Skull base and vertebrae: No acute fracture. No primary bone lesion or focal pathologic process. Soft tissues and spinal canal: No prevertebral fluid or swelling. No visible canal hematoma. Disc levels:  No evidence of high-grade spinal canal stenosis. Upper chest: Negative. Other: None IMPRESSION: 1. Soft tissue hematoma along the left lateral frontal scalp. No evidence of underlying calvarial fracture or intracranial injury. 2. No acute facial bone fracture. 3. No acute cervical spine fracture. 4. There is a 0.0 x 1.9 x 2.2 cm suprasellar mass with mass effect on the optic chiasm. 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 Electronically Signed   By: Lorenza Cambridge M.D.   On: 10/13/2022 17:11   DG Pelvis Portable  Result Date: 10/13/2022 CLINICAL DATA:  Fall. EXAM: PORTABLE PELVIS 1-2 VIEWS COMPARISON:  None Available. FINDINGS: There is no evidence of pelvic fracture or diastasis. No pelvic bone lesions are seen. IMPRESSION: Negative. Electronically Signed   By: Lupita Raider M.D.   On: 10/13/2022 16:37   DG Shoulder Left Portable  Result Date: 10/13/2022 CLINICAL DATA:  Fall. EXAM: LEFT SHOULDER COMPARISON:  October 17, 2013. FINDINGS: No definite fracture is noted. Old left rib fractures are noted. There does appear to be some elevation of the distal end of the left clavicle relative to acromion which was present on prior exam suggesting acromioclavicular joint separation. IMPRESSION: Stable elevation of distal end of left clavicle relative to the acromion suggesting stable old acromioclavicular joint separation. No acute fracture or dislocation is noted. Electronically Signed   By: Zenda Alpers.D.  On: 10/13/2022 16:37   DG Elbow 2 Views Left  Result Date: 10/13/2022 CLINICAL DATA:  Fall. EXAM: LEFT ELBOW - 2  VIEW COMPARISON:  None Available. FINDINGS: There is no evidence of fracture, dislocation, or joint effusion. There is no evidence of arthropathy or other focal bone abnormality. Soft tissues are unremarkable. IMPRESSION: Negative. Electronically Signed   By: Lupita Raider M.D.   On: 10/13/2022 16:35   DG Knee 2 Views Left  Result Date: 10/13/2022 CLINICAL DATA:  Left knee pain after fall. EXAM: LEFT KNEE - 1-2 VIEW COMPARISON:  None Available. FINDINGS: No evidence of fracture, dislocation, or joint effusion. No evidence of arthropathy or other focal bone abnormality. Vascular calcifications are noted. IMPRESSION: No significant abnormality seen in the left knee. Electronically Signed   By: Lupita Raider M.D.   On: 10/13/2022 16:33   DG Hip Unilat W or Wo Pelvis 2-3 Views Left  Result Date: 10/13/2022 CLINICAL DATA:  Status post fall.  Rule out fracture. EXAM: DG HIP (WITH OR WITHOUT PELVIS) 2-3V LEFT COMPARISON:  None Available. FINDINGS: There is no evidence of hip fracture or dislocation. There is cortical irregularity of the superior pubic ramus on the left, obscured by formed stool in the rectum. IMPRESSION: 1. No evidence of hip fracture or dislocation. 2. Cortical irregularity of the superior pubic ramus on the left, obscured by formed stool in the rectum. Consider further evaluation with dedicated pelvic radiograph or CT. Electronically Signed   By: Ted Mcalpine M.D.   On: 10/13/2022 16:32   DG Chest Port 1 View  Result Date: 10/13/2022 CLINICAL DATA:  Trauma. EXAM: PORTABLE CHEST 1 VIEW COMPARISON:  Sep 07, 2022 FINDINGS: Cardiomediastinal silhouette is enlarged. Mediastinal contours appear intact. Possible interstitial pulmonary edema with small bilateral pleural effusions. Osseous structures are without acute abnormality. Soft tissues are grossly normal. IMPRESSION: Possible interstitial pulmonary edema with small bilateral pleural effusions. Electronically Signed   By: Ted Mcalpine M.D.   On: 10/13/2022 16:30    Anti-infectives: Anti-infectives (From admission, onward)    None        Assessment/Plan  89yoF s/p fall Left 2-5 rib fractures - IS 10x/hr while awake; multidmodal pain control, wean O2 as able Rectus sheath hematoma - monitor abdominal exam, no extrav on CT; monitory hgb -- re-check today at 1400 and in AM. Left ear lac - repaired by EDP with suture Left forehead lac - repaired by EDP with dermabond Chronic constipation - based on CT, large rectal stool burden. Bowel regimen, enema today   Afib - hold anticoagulation   PPX: SCDs; holding chemical dvt ppx in light of onboard anticoagulation, rectus sheath hematoma FEN: soft diet, SLIV ID: none Dispo - 4NP     LOS: 1 day   I reviewed nursing notes, hospitalist notes, last 24 h vitals and pain scores, last 48 h intake and output, last 24 h labs and trends, and last 24 h imaging results.  This care required moderate level of medical decision making.   Hosie Spangle, PA-C [facility] Please see Amion for pager number during day hours 7:00am-4:30pm

## 2022-10-14 NOTE — TOC Initial Note (Signed)
Transition of Care [facility]) - Initial/Assessment Note    Patient Details  Name: Diana [name] MRN: [id number] Date of Birth: 1923/11/20  [facility] CM/SW Contact:    Glennon Mac, RN Phone Number: 10/14/2022, 5:10 PM  Clinical Narrative:                 Pt is a 89 year old admitted 10/13/22 after mechanical fall. Dx with rib fractures L 2-5, scalp laceration and hematoma and small R PE.  Patient was recently at [facility] for a few weeks and then transferred to [facility] on 10/13/22.  She fell very soon after arriving at ALF.  Spoke with patient's son, and he states he would prefer that patient go back to [place], but does not want her to be discharged too soon.  We discussed continued therapies with [facility] (PT/OT and ST) as PTA, and he would like to continue these.   Called admitting at [facility] in [place]; spoke with [place] who states pt certainly can return to facility.  I was transferred to [facility] and [employer], [place] [phone]), who states that if patient is dc back to facility within 2-3 days, she will not require a new FL2 or Nurse assessment from their facility.  They cannot accept the patient back on the weekend, per RN.  If Monday is dc date, patient will need new FL2, and Health/Wellness nurse will come to hospital to evaluate patient for return to ALF.   Will see how patient progresses overnight and update facility tomorrow.    Expected Discharge Plan: Assisted Living Barriers to Discharge: Continued Medical Work up            Expected Discharge Plan and Services   Discharge Planning Services: CM Consult                               HH Arranged: PT, OT, Speech Therapy HH Agency: [facility] Date [facility] Agency Contacted: 10/14/22 Time HH Agency Contacted: 1707 Representative spoke with at [facility] Agency: Angie  Prior Living Arrangements/Services   Lives with:: Facility Resident Patient  language and need for interpreter reviewed:: Yes Do you feel safe going back to the place where you live?: Yes      Need for Family Participation in Patient Care: Yes (Comment) Care giver support system in place?: Yes (comment) Current home services: Home PT, Home OT, Other (comment) (ST) Criminal Activity/Legal Involvement Pertinent to Current Situation/Hospitalization: No - Comment as needed     Permission Sought/Granted      Share Information with NAME: Thedora Rings     Permission granted to share info w Relationship: son  Permission granted to share info w Contact Information: [phone]  Emotional Assessment Appearance:: Appears stated age   Affect (typically observed): Accepting, Appropriate Orientation: : Oriented to Self, Oriented to Place, Oriented to Situation      Admission diagnosis:  Rib fractures [S22.49XA] Closed fracture of multiple ribs of left side, initial encounter [S22.42XA] Patient Active Problem List   Diagnosis Date Noted   Hypokalemia 10/14/2022   Rib fractures 10/13/2022   Congestive heart failure (HCC) 09/09/2022   Dilated cardiomyopathy (HCC) 09/09/2022   Acute HFrEF (heart failure with reduced ejection fraction) (HCC) 09/08/2022   Fluid overload 09/03/2022   Troponin I above reference range 09/03/2022   Atrial fibrillation (HCC) 09/03/2022   Cellulitis of lower extremity 08/27/2022   Venous stasis ulcers of both  lower extremities (HCC) 08/27/2022   Peripheral edema 06/30/2022   Hematoma 04/13/2022   Other chronic pain 06/26/2021   Burning with urination 03/01/2018   Iron deficiency anemia due to chronic blood loss 06/16/2017   Lymphedema of right arm 11/17/2016   Vaginal irritation 10/26/2016   Right-sided low back pain without sciatica 08/22/2015   Spinal stenosis in cervical region 08/07/2015   Cervical spondylosis with myelopathy 06/26/2015   Aortic valve calcification 06/03/2015   BPV (benign positional vertigo) 01/29/2015   Poor  balance 10/07/2011   History of right breast cancer 03/29/2011   Vitamin D deficiency 10/17/2009   Prediabetes 10/17/2009   COPD (chronic obstructive pulmonary disease) (HCC) 12/19/2007   INSOMNIA, CHRONIC 10/25/2007   BACK PAIN, LUMBAR 03/01/2007   Hypothyroidism 01/18/2007   HYPONATREMIA, MILD 01/18/2007   Essential hypertension 01/18/2007   PCP:  Excell Seltzer, MD Pharmacy:   [facility] - [place], [place] - [phone], [address] [place] [address] Phone: [phone] Fax: [phone]  [facility] - [place], [place] - [address] [place] [address] Phone: [phone] Fax: [phone]     Social Determinants of Health (SDOH) Social History: SDOH Screenings   Food Insecurity: No Food Insecurity (09/03/2022)  Housing: Low Risk  (09/03/2022)  Transportation Needs: No Transportation Needs (09/03/2022)  Utilities: Not At Risk (09/03/2022)  Alcohol Screen: Low Risk  (06/10/2022)  Depression (PHQ2-9): Low Risk  (06/10/2022)  Financial Resource Strain: Low Risk  (06/10/2022)  Physical Activity: Inactive (06/10/2022)  Social Connections: Socially Isolated (06/10/2022)  Stress: No Stress Concern Present (06/10/2022)  Tobacco Use: Medium Risk (10/13/2022)   SDOH Interventions:     Readmission Risk Interventions     No data to display         Quintella Baton, RN, BSN  Trauma/Neuro ICU Case Manager [phone]

## 2022-10-14 NOTE — Progress Notes (Signed)
   10/14/22 1430  Urine Characteristics  Urine Color Yellow/straw  Urine Appearance Clear  Bladder Scan Volume (mL) 498 mL  Intermittent/Straight Cath (mL) 650 mL  Intermittent Catheter Size 16  Hygiene Peri care

## 2022-10-14 NOTE — Progress Notes (Signed)
PROGRESS NOTE    Diana [name]  ZOX:[id number] DOB: 1923/07/20 DOA: 10/13/2022 PCP: Excell Seltzer, MD    Brief Narrative:  Ms Diana [name] is a 89 year old female with past medical history of obstructive sleep apnea, hypertension, hypothyroidism, COPD who presented after mechanical fall.  Workup in the emergency department revealed rib fractures as well as scalp hematoma.  Patient was admitted to the trauma service for further workup.  Medicine team was consulted for medication management.  Assessment and plan.  Fall with Left scalp hematoma, Left-sided rib fractures -Management per trauma team.  Lidocaine patch, analgesia with oxycodone and Dilaudid Robaxin, physical therapy evaluation for possible need for rehabilitation.  CAD- continue aspirin, Lipitor, metoprolol no active chest pain.   Concern for cranial aneurysm-patient will need CTA head and neck to rule out ACOM aneurysm.   Hyperlipidemia-continue Lipitor   History of chronic systolic heart failure-continue Lasix, metoprolol.  Intake and output charting, daily weights.  Review of previous TTE in May showed EF 25%. Marland Kitchen   Hypothyroidism-continue Synthroid   Insomnia-continue trazodone    Hyponatremia likely related to hypervolemia- continue lasix, continue to monitor.  Sodium level at 132 today.   Hypokalemia-will replace through IV.  Will give 40 mEq of potassium today.  Patient is currently NPO.  Check BMP in AM.       DVT prophylaxis: SCDs Start: 10/13/22 2116   Code Status:     Code Status: DNR  Disposition: As per primary team.  Might need rehabitation.  Status is: Inpatient   Family Communication: None at bedside  Procedures:  None  Antimicrobials:  None  Anti-infectives (From admission, onward)    None        Subjective: Today, patient was seen and examined at bedside.  Hard of hearing.  Complains of mild discomfort.  Denies any shortness of breath or dyspnea.  Objective: Vitals:   10/13/22  2110 10/13/22 2322 10/14/22 0343 10/14/22 0756  BP:  120/84 116/71 (!) 111/55  Pulse: 97 84 87 92  Resp:  11 17 16   Temp: 97.9 F (36.6 C)  97.6 F (36.4 C) (!) 97.4 F (36.3 C)  TempSrc: Oral  Oral Oral  SpO2: 98% 92% 95% 94%  Weight:      Height:       No intake or output data in the 24 hours ending 10/14/22 0951 Filed Weights   10/13/22 1632  Weight: 74.4 kg    Physical Examination: Body mass index is 28.15 kg/m.  General:  Average built, not in obvious distress, elderly female, appears weak and deconditioned, HENT:   Moist mucosa.   Chest.  Diminished breath sounds bilaterally.  Tenderness over the left chest wall. CVS: S1 &S2 heard. No murmur.  Regular rate and rhythm. Abdomen: Soft, nontender, nondistended.  Bowel sounds are heard.   Extremities: No cyanosis, clubbing or edema.  Peripheral pulses are palpable. Psych: Alert, awake and Communicative, CNS:  No cranial nerve deficits.  Moves extremities. Skin: Warm and dry.  Diffuse bruising noted.  Data Reviewed:   CBC: Recent Labs  Lab 10/13/22 1611 10/13/22 1614 10/14/22 0348  WBC 6.1  --  7.1  HGB 9.7* 10.5* 8.9*  HCT 31.4* 31.0* 27.8*  MCV 107.2*  --  102.2*  PLT 183  --  175    Basic Metabolic Panel: Recent Labs  Lab 10/13/22 1611 10/13/22 1614 10/14/22 0348  NA 132* 134* 132*  K 3.1* 3.1* 3.3*  CL 97* 95* 97*  CO2 24  --  26  GLUCOSE 110* 108* 84  BUN 24* 25* 22  CREATININE 1.07* 1.10* 0.99  CALCIUM 8.5*  --  8.4*    Liver Function Tests: Recent Labs  Lab 10/13/22 1611  AST 21  ALT 15  ALKPHOS 86  BILITOT 1.1  PROT 6.3*  ALBUMIN 3.2*     Radiology Studies: CT CHEST ABDOMEN PELVIS W CONTRAST  Result Date: 10/13/2022 CLINICAL DATA:  Pain after fall.  On Eliquis EXAM: CT CHEST, ABDOMEN, AND PELVIS WITH CONTRAST TECHNIQUE: Multidetector CT imaging of the chest, abdomen and pelvis was performed following the standard protocol during bolus administration of intravenous contrast.  RADIATION DOSE REDUCTION: This exam was performed according to the departmental dose-optimization program which includes automated exposure control, adjustment of the mA and/or kV according to patient size and/or use of iterative reconstruction technique. CONTRAST:  75mL OMNIPAQUE IOHEXOL 350 MG/ML SOLN COMPARISON:  None Available. FINDINGS: CT CHEST FINDINGS Cardiovascular: Heart is enlarged particularly the right atrium. No pericardial effusion. There are calcifications in the area of the aortic valve. The thoracic aorta overall has a normal course and caliber with some scattered vascular calcifications. Coronary artery calcifications are seen. Is also significant calcifications along the great vessels. No mediastinal hematoma. Mediastinum/Nodes: No specific abnormal lymph node enlargement identified in the axillary regions, hilum or mediastinum. Normal caliber thoracic esophagus. Heterogeneous thyroid. Lungs/Pleura: There is a small right pleural effusion. Trace left. Breathing motion seen throughout the examination. There are some areas of bandlike changes along the lung bases likely scar or atelectasis. There are some ill-defined areas of ground-glass in both lungs as well. Please correlate for any evidence of air trapping. Mild bronchial wall thickening identified in several locations. No pneumothorax. Musculoskeletal: Scattered degenerative changes along the spine. Degenerative changes of the shoulders. Fractures involving the anterior aspect of the left second, third, fourth, fifth ribs. Based on appearance these are age-indeterminate. Please correlate to point tenderness. CT ABDOMEN PELVIS FINDINGS Hepatobiliary: Contrast is in the late arterial phase. Grossly preserved hepatic parenchyma. Dependent stone in the nondilated gallbladder. Patent portal vein. Pancreas: Unremarkable. No pancreatic ductal dilatation or surrounding inflammatory changes. Spleen: Normal in size without focal abnormality.  Adrenals/Urinary Tract: Slight thickening of the adrenal glands, nonspecific. Malrotated right kidney. Mild bilateral renal atrophy. No enhancing renal mass or collecting system dilatation. Distended urinary bladder. Stomach/Bowel: No oral contrast. Significant stool in the rectum. Scattered colonic diverticula. No large bowel or small bowel dilatation. Stomach is relatively collapsed. Small bowel is nondilated. No free air or free fluid. Vascular/Lymphatic: Aortic atherosclerosis. No enlarged abdominal or pelvic lymph nodes. Reproductive: Uterus and bilateral adnexa are unremarkable. Other: Nonspecific presacral fat stranding. Diffuse anasarca identified as well. No free intra-air or free fluid. Musculoskeletal: Left-sided rectus muscle hematoma identified. This extends cephalocaudal for proximally 9 cm. Transverse dimensions of 9.3 by 4.4 cm. No active extravasation. Curvature and degenerative changes along the spine. Degenerative changes of the pelvis. IMPRESSION: Left-sided rectus muscle hematoma measuring up to 9.3 cm. No active extravasation. Multiple left-sided anterior possibly acute rib fractures. No underlying pneumothorax. Small right pleural effusion.  Tiny left Enlarged heart. Colonic diverticulosis. Significant stool in the rectum. No bowel obstruction, free air or fluid collection. Gallstone. Anasarca.  Nonspecific stranding in the presacral pelvis. Electronically Signed   By: Karen Kays M.D.   On: 10/13/2022 17:17   CT Head Wo Contrast  Result Date: 10/13/2022 CLINICAL DATA:  Head trauma, minor (Age >= 65y); Facial trauma, blunt; Neck trauma (Age >= 65y) EXAM: CT HEAD  WITHOUT CONTRAST CT MAXILLOFACIAL WITHOUT CONTRAST CT CERVICAL SPINE WITHOUT CONTRAST TECHNIQUE: Multidetector CT imaging of the head, cervical spine, and maxillofacial structures were performed using the standard protocol without intravenous contrast. Multiplanar CT image reconstructions of the cervical spine and maxillofacial  structures were also generated. RADIATION DOSE REDUCTION: This exam was performed according to the departmental dose-optimization program which includes automated exposure control, adjustment of the mA and/or kV according to patient size and/or use of iterative reconstruction technique. COMPARISON:  None Available. FINDINGS: CT HEAD FINDINGS Brain: No evidence of acute infarction, hemorrhage, hydrocephalus, extra-axial collection. There is a 0.0 x 1.9 x 2.2 cm suprasellar mass with mass effect on the optic chiasm. 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 Sequela of moderate chronic microvascular ischemic change. Ventriculomegaly likely secondary to generalized volume loss. Vascular: No hyperdense vessel or unexpected calcification. Skull: Soft tissue hematoma along the left lateral frontal scalp. No evidence of underlying calvarial fracture. Other: None. CT MAXILLOFACIAL FINDINGS Osseous: No fracture or mandibular dislocation. No destructive process. Orbits: Negative. No traumatic or inflammatory finding. Sinuses: No middle ear or mastoid effusion. Paranasal sinuses are clear. Bilateral lens replacement. Orbits are otherwise unremarkable. Soft tissues: Soft tissue injury to the posterior auricular soft tissues on the left. CT CERVICAL SPINE FINDINGS Alignment: Straightening of the normal cervical lordosis. Grade 1 anterolisthesis of C3 on C4 and C4 on C5. Skull base and vertebrae: No acute fracture. No primary bone lesion or focal pathologic process. Soft tissues and spinal canal: No prevertebral fluid or swelling. No visible canal hematoma. Disc levels:  No evidence of high-grade spinal canal stenosis. Upper chest: Negative. Other: None IMPRESSION: 1. Soft tissue hematoma along the left lateral frontal scalp. No evidence of underlying calvarial fracture or intracranial  injury. 2. No acute facial bone fracture. 3. No acute cervical spine fracture. 4. There is a 0.0 x 1.9 x 2.2 cm suprasellar mass with mass effect on the optic chiasm. 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 Electronically Signed   By: Lorenza Cambridge M.D.   On: 10/13/2022 17:11   CT Cervical Spine Wo Contrast  Result Date: 10/13/2022 CLINICAL DATA:  Head trauma, minor (Age >= 65y); Facial trauma, blunt; Neck trauma (Age >= 65y) EXAM: CT HEAD WITHOUT CONTRAST CT MAXILLOFACIAL WITHOUT CONTRAST CT CERVICAL SPINE WITHOUT CONTRAST TECHNIQUE: Multidetector CT imaging of the head, cervical spine, and maxillofacial structures were performed using the standard protocol without intravenous contrast. Multiplanar CT image reconstructions of the cervical spine and maxillofacial structures were also generated. RADIATION DOSE REDUCTION: This exam was performed according to the departmental dose-optimization program which includes automated exposure control, adjustment of the mA and/or kV according to patient size and/or use of iterative reconstruction technique. COMPARISON:  None Available. FINDINGS: CT HEAD FINDINGS Brain: No evidence of acute infarction, hemorrhage, hydrocephalus, extra-axial collection. There is a 0.0 x 1.9 x 2.2 cm suprasellar mass with mass effect on the optic chiasm. 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 Sequela of moderate chronic microvascular ischemic change. Ventriculomegaly likely secondary to generalized volume loss.  Vascular: No hyperdense vessel or unexpected calcification. Skull: Soft tissue hematoma along the left lateral frontal scalp. No evidence of underlying calvarial fracture. Other: None. CT  MAXILLOFACIAL FINDINGS Osseous: No fracture or mandibular dislocation. No destructive process. Orbits: Negative. No traumatic or inflammatory finding. Sinuses: No middle ear or mastoid effusion. Paranasal sinuses are clear. Bilateral lens replacement. Orbits are otherwise unremarkable. Soft tissues: Soft tissue injury to the posterior auricular soft tissues on the left. CT CERVICAL SPINE FINDINGS Alignment: Straightening of the normal cervical lordosis. Grade 1 anterolisthesis of C3 on C4 and C4 on C5. Skull base and vertebrae: No acute fracture. No primary bone lesion or focal pathologic process. Soft tissues and spinal canal: No prevertebral fluid or swelling. No visible canal hematoma. Disc levels:  No evidence of high-grade spinal canal stenosis. Upper chest: Negative. Other: None IMPRESSION: 1. Soft tissue hematoma along the left lateral frontal scalp. No evidence of underlying calvarial fracture or intracranial injury. 2. No acute facial bone fracture. 3. No acute cervical spine fracture. 4. There is a 0.0 x 1.9 x 2.2 cm suprasellar mass with mass effect on the optic chiasm. 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 Electronically Signed   By: Lorenza Cambridge M.D.   On: 10/13/2022 17:11   CT Maxillofacial Wo Contrast  Result Date: 10/13/2022 CLINICAL DATA:  Head trauma, minor (Age >= 65y); Facial trauma, blunt; Neck trauma (Age >= 65y) EXAM: CT HEAD WITHOUT CONTRAST CT MAXILLOFACIAL WITHOUT CONTRAST CT CERVICAL SPINE WITHOUT CONTRAST TECHNIQUE: Multidetector CT imaging of the head, cervical spine, and maxillofacial structures were performed using the standard protocol without intravenous contrast. Multiplanar CT image reconstructions of the cervical spine and maxillofacial structures were also generated. RADIATION DOSE REDUCTION: This exam was performed according  to the departmental dose-optimization program which includes automated exposure control, adjustment of the mA and/or kV according to patient size and/or use of iterative reconstruction technique. COMPARISON:  None Available. FINDINGS: CT HEAD FINDINGS Brain: No evidence of acute infarction, hemorrhage, hydrocephalus, extra-axial collection. There is a 0.0 x 1.9 x 2.2 cm suprasellar mass with mass effect on the optic chiasm. 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 Sequela of moderate chronic microvascular ischemic change. Ventriculomegaly likely secondary to generalized volume loss. Vascular: No hyperdense vessel or unexpected calcification. Skull: Soft tissue hematoma along the left lateral frontal scalp. No evidence of underlying calvarial fracture. Other: None. CT MAXILLOFACIAL FINDINGS Osseous: No fracture or mandibular dislocation. No destructive process. Orbits: Negative. No traumatic or inflammatory finding. Sinuses: No middle ear or mastoid effusion. Paranasal sinuses are clear. Bilateral lens replacement. Orbits are otherwise unremarkable. Soft tissues: Soft tissue injury to the posterior auricular soft tissues on the left. CT CERVICAL SPINE FINDINGS Alignment: Straightening of the normal cervical lordosis. Grade 1 anterolisthesis of C3 on C4 and C4 on C5. Skull base and vertebrae: No acute fracture. No primary bone lesion or focal pathologic process. Soft tissues and spinal canal: No prevertebral fluid or swelling. No visible canal hematoma. Disc levels:  No evidence of high-grade spinal canal stenosis. Upper chest: Negative. Other: None IMPRESSION: 1. Soft tissue hematoma along the left lateral frontal scalp. No evidence of underlying calvarial fracture or intracranial injury. 2. No acute  facial bone fracture. 3. No acute cervical spine fracture. 4. There is a  0.0 x 1.9 x 2.2 cm suprasellar mass with mass effect on the optic chiasm. 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 Electronically Signed   By: Lorenza Cambridge M.D.   On: 10/13/2022 17:11   DG Pelvis Portable  Result Date: 10/13/2022 CLINICAL DATA:  Fall. EXAM: PORTABLE PELVIS 1-2 VIEWS COMPARISON:  None Available. FINDINGS: There is no evidence of pelvic fracture or diastasis. No pelvic bone lesions are seen. IMPRESSION: Negative. Electronically Signed   By: Lupita Raider M.D.   On: 10/13/2022 16:37   DG Shoulder Left Portable  Result Date: 10/13/2022 CLINICAL DATA:  Fall. EXAM: LEFT SHOULDER COMPARISON:  October 17, 2013. FINDINGS: No definite fracture is noted. Old left rib fractures are noted. There does appear to be some elevation of the distal end of the left clavicle relative to acromion which was present on prior exam suggesting acromioclavicular joint separation. IMPRESSION: Stable elevation of distal end of left clavicle relative to the acromion suggesting stable old acromioclavicular joint separation. No acute fracture or dislocation is noted. Electronically Signed   By: Lupita Raider M.D.   On: 10/13/2022 16:37   DG Elbow 2 Views Left  Result Date: 10/13/2022 CLINICAL DATA:  Fall. EXAM: LEFT ELBOW - 2 VIEW COMPARISON:  None Available. FINDINGS: There is no evidence of fracture, dislocation, or joint effusion. There is no evidence of arthropathy or other focal bone abnormality. Soft tissues are unremarkable. IMPRESSION: Negative. Electronically Signed   By: Lupita Raider M.D.   On: 10/13/2022 16:35   DG Knee 2 Views Left  Result Date: 10/13/2022 CLINICAL DATA:  Left knee pain after fall. EXAM: LEFT KNEE - 1-2 VIEW COMPARISON:  None Available. FINDINGS: No evidence of fracture, dislocation, or joint effusion. No evidence of arthropathy or other  focal bone abnormality. Vascular calcifications are noted. IMPRESSION: No significant abnormality seen in the left knee. Electronically Signed   By: Lupita Raider M.D.   On: 10/13/2022 16:33   DG Hip Unilat W or Wo Pelvis 2-3 Views Left  Result Date: 10/13/2022 CLINICAL DATA:  Status post fall.  Rule out fracture. EXAM: DG HIP (WITH OR WITHOUT PELVIS) 2-3V LEFT COMPARISON:  None Available. FINDINGS: There is no evidence of hip fracture or dislocation. There is cortical irregularity of the superior pubic ramus on the left, obscured by formed stool in the rectum. IMPRESSION: 1. No evidence of hip fracture or dislocation. 2. Cortical irregularity of the superior pubic ramus on the left, obscured by formed stool in the rectum. Consider further evaluation with dedicated pelvic radiograph or CT. Electronically Signed   By: Ted Mcalpine M.D.   On: 10/13/2022 16:32   DG Chest Port 1 View  Result Date: 10/13/2022 CLINICAL DATA:  Trauma. EXAM: PORTABLE CHEST 1 VIEW COMPARISON:  Sep 07, 2022 FINDINGS: Cardiomediastinal silhouette is enlarged. Mediastinal contours appear intact. Possible interstitial pulmonary edema with small bilateral pleural effusions. Osseous structures are without acute abnormality. Soft tissues are grossly normal. IMPRESSION: Possible interstitial pulmonary edema with small bilateral pleural effusions. Electronically Signed   By: Ted Mcalpine M.D.   On: 10/13/2022 16:30      LOS: 1 day   Joycelyn Das, MD [facility] Available via Epic secure chat 7am-7pm After these hours, please refer to  coverage provider listed on [email or web address] 10/14/2022, 9:51 AM

## 2022-10-15 ENCOUNTER — Inpatient Hospital Stay (HOSPITAL_COMMUNITY): Payer: Medicare Other

## 2022-10-15 DIAGNOSIS — I10 Essential (primary) hypertension: Secondary | ICD-10-CM | POA: Diagnosis not present

## 2022-10-15 DIAGNOSIS — S2242XA Multiple fractures of ribs, left side, initial encounter for closed fracture: Secondary | ICD-10-CM | POA: Diagnosis not present

## 2022-10-15 DIAGNOSIS — E876 Hypokalemia: Secondary | ICD-10-CM | POA: Diagnosis not present

## 2022-10-15 DIAGNOSIS — J449 Chronic obstructive pulmonary disease, unspecified: Secondary | ICD-10-CM | POA: Diagnosis not present

## 2022-10-15 DIAGNOSIS — I509 Heart failure, unspecified: Secondary | ICD-10-CM

## 2022-10-15 LAB — BASIC METABOLIC PANEL
Anion gap: 10 (ref 5–15)
BUN: 18 mg/dL (ref 8–23)
CO2: 24 mmol/L (ref 22–32)
Calcium: 8.6 mg/dL — ABNORMAL LOW (ref 8.9–10.3)
Chloride: 98 mmol/L (ref 98–111)
Creatinine, Ser: 1.04 mg/dL — ABNORMAL HIGH (ref 0.44–1.00)
GFR, Estimated: 49 mL/min — ABNORMAL LOW (ref >60)
Glucose, Bld: 85 mg/dL (ref 70–99)
Potassium: 3.7 mmol/L (ref 3.5–5.1)
Sodium: 132 mmol/L — ABNORMAL LOW (ref 135–145)

## 2022-10-15 LAB — CBC
HCT: 30.6 % — ABNORMAL LOW (ref 36.0–46.0)
Hemoglobin: 9.6 g/dL — ABNORMAL LOW (ref 12.0–15.0)
MCH: 32.8 pg (ref 26.0–34.0)
MCHC: 31.4 g/dL (ref 30.0–36.0)
MCV: 104.4 fL — ABNORMAL HIGH (ref 80.0–100.0)
Platelets: 163 10*3/uL (ref 150–400)
RBC: 2.93 MIL/uL — ABNORMAL LOW (ref 3.87–5.11)
RDW: 18.9 % — ABNORMAL HIGH (ref 11.5–15.5)
WBC: 5.9 10*3/uL (ref 4.0–10.5)
nRBC: 0 % (ref 0.0–0.2)

## 2022-10-15 LAB — MAGNESIUM: Magnesium: 2.2 mg/dL (ref 1.7–2.4)

## 2022-10-15 MED ORDER — IOHEXOL 350 MG/ML SOLN
75.0000 mL | Freq: Once | INTRAVENOUS | Status: AC | PRN
  Administered 2022-10-15: 75 mL via INTRAVENOUS

## 2022-10-15 NOTE — TOC Progression Note (Addendum)
Transition of Care [facility]) - Progression Note    Patient Details  Name: Diana [name] MRN: [id number] Date of Birth: 12/25/1923  Transition of Care [facility]) CM/SW Contact  Glennon Mac, RN Phone Number: 10/15/2022, 2:36 PM  Clinical Narrative:    Sherron Monday with [facility], Health and Wellness Coordinator at [facility] [place]; informed her that patient will be ready for discharge on Monday, barring any issues over the weekend.  She states that she will need a new FL2 and resumption orders for HHPT/OT/ST faxed to [phone].  She states she will not have to visit patient in hospital to perform pre-admission assessment. Will initiate FL2 and fax with HH orders to facility.  Plan to follow up on Monday, July 1, for medical stability. Notified Marylene Land with [facility] of plan for DC on Monday.     Expected Discharge Plan: Assisted Living Barriers to Discharge: Continued Medical Work up  Expected Discharge Plan and Services   Discharge Planning Services: CM Consult                               HH Arranged: PT, OT, Speech Therapy HH Agency: [facility] Date [facility] Agency Contacted: 10/14/22 Time HH Agency Contacted: 1707 Representative spoke with at [facility] Agency: Angie   Social Determinants of Health (SDOH) Interventions SDOH Screenings   Food Insecurity: No Food Insecurity (09/03/2022)  Housing: Low Risk  (09/03/2022)  Transportation Needs: No Transportation Needs (09/03/2022)  Utilities: Not At Risk (09/03/2022)  Alcohol Screen: Low Risk  (06/10/2022)  Depression (PHQ2-9): Low Risk  (06/10/2022)  Financial Resource Strain: Low Risk  (06/10/2022)  Physical Activity: Inactive (06/10/2022)  Social Connections: Socially Isolated (06/10/2022)  Stress: No Stress Concern Present (06/10/2022)  Tobacco Use: Medium Risk (10/13/2022)    Readmission Risk Interventions     No data to display         Quintella Baton, RN, BSN  Trauma/Neuro ICU Case Manager [phone]

## 2022-10-15 NOTE — Progress Notes (Signed)
PT Cancellation Note  Patient Details Name: Diana [name] MRN: [id number] DOB: Jul 10, 1923   Cancelled Treatment:    Reason Eval/Treat Not Completed: Fatigue/lethargy limiting ability to participate; attempted 3x to see pt and each time she was either just finished getting up for bathing or toileting with nursing or just settled to rest.  Will attempt again tomorrow.   [name] 10/15/2022, 2:50 PM Sheran Lawless, PT Acute Rehabilitation Services Office:[phone] 10/15/2022

## 2022-10-15 NOTE — Progress Notes (Signed)
[facility] Progress Note     Subjective: CC:  NAEO. Did well with PT yesterday. Had some sanguinous oozing from L ear that is now hemostatic. Hgb stable, 9.6. +BM x2 s/p enema  Objective: Vital signs in last 24 hours: Temp:  [97.3 F (36.3 C)-98.4 F (36.9 C)] 97.3 F (36.3 C) (06/28 0730) Pulse Rate:  [89-121] 97 (06/28 0730) Resp:  [7-26] 16 (06/28 0730) BP: (94-124)/(51-80) 97/69 (06/28 0730) SpO2:  [90 %-97 %] 95 % (06/28 0730) Weight:  [72.9 kg] 72.9 kg (06/27 1400) Last BM Date : 10/14/22  Intake/Output from previous day: 06/27 0701 - 06/28 0700 In: 420 [P.O.:420] Out: 1275 [Urine:1275] Intake/Output this shift: Total I/O In: 120 [P.O.:120] Out: -   PE: Gen:  Alert, elderly female, NAD HEENT: L peri-orbital ecchymosis  Card:  Regular rate and rhythm Pulm:  Normal effort on [place] Abd: Soft, non-tender, non-distended, left hemi-abdomen with no visible hematoma but palpable firmness/fullness in left hemi-abdomen  Skin: warm and dry, no rashes  Psych: A&Ox3   Lab Results:  Recent Labs    10/14/22 1333 10/15/22 0435  WBC 6.5 5.9  HGB 9.7* 9.6*  HCT 30.5* 30.6*  PLT 177 163   BMET Recent Labs    10/14/22 0348 10/15/22 0435  NA 132* 132*  K 3.3* 3.7  CL 97* 98  CO2 26 24  GLUCOSE 84 85  BUN 22 18  CREATININE 0.99 1.04*  CALCIUM 8.4* 8.6*   PT/INR Recent Labs    10/13/22 1611  LABPROT 21.6*  INR 1.9*   CMP     Component Value Date/Time   NA 132 (L) 10/15/2022 0435   NA 136 (A) 07/02/2015 0000   K 3.7 10/15/2022 0435   CL 98 10/15/2022 0435   CO2 24 10/15/2022 0435   GLUCOSE 85 10/15/2022 0435   BUN 18 10/15/2022 0435   BUN 16 07/02/2015 0000   CREATININE 1.04 (H) 10/15/2022 0435   CREATININE 1.27 (H) 08/27/2022 1609   CALCIUM 8.6 (L) 10/15/2022 0435   PROT 6.3 (L) 10/13/2022 1611   ALBUMIN 3.2 (L) 10/13/2022 1611   AST 21 10/13/2022 1611   ALT 15 10/13/2022 1611   ALKPHOS 86 10/13/2022 1611   BILITOT 1.1 10/13/2022  1611   GFRNONAA 49 (L) 10/15/2022 0435   GFRAA >60 01/10/2017 1248   Lipase  No results found for: "LIPASE"     Studies/Results: CT CHEST ABDOMEN PELVIS W CONTRAST  Result Date: 10/13/2022 CLINICAL DATA:  Pain after fall.  On Eliquis EXAM: CT CHEST, ABDOMEN, AND PELVIS WITH CONTRAST TECHNIQUE: Multidetector CT imaging of the chest, abdomen and pelvis was performed following the standard protocol during bolus administration of intravenous contrast. RADIATION DOSE REDUCTION: This exam was performed according to the departmental dose-optimization program which includes automated exposure control, adjustment of the mA and/or kV according to patient size and/or use of iterative reconstruction technique. CONTRAST:  75mL OMNIPAQUE IOHEXOL 350 MG/ML SOLN COMPARISON:  None Available. FINDINGS: CT CHEST FINDINGS Cardiovascular: Heart is enlarged particularly the right atrium. No pericardial effusion. There are calcifications in the area of the aortic valve. The thoracic aorta overall has a normal course and caliber with some scattered vascular calcifications. Coronary artery calcifications are seen. Is also significant calcifications along the great vessels. No mediastinal hematoma. Mediastinum/Nodes: No specific abnormal lymph node enlargement identified in the axillary regions, hilum or mediastinum. Normal caliber thoracic esophagus. Heterogeneous thyroid. Lungs/Pleura: There is a small right pleural effusion. Trace left. Breathing motion seen throughout  the examination. There are some areas of bandlike changes along the lung bases likely scar or atelectasis. There are some ill-defined areas of ground-glass in both lungs as well. Please correlate for any evidence of air trapping. Mild bronchial wall thickening identified in several locations. No pneumothorax. Musculoskeletal: Scattered degenerative changes along the spine. Degenerative changes of the shoulders. Fractures involving the anterior aspect of the  left second, third, fourth, fifth ribs. Based on appearance these are age-indeterminate. Please correlate to point tenderness. CT ABDOMEN PELVIS FINDINGS Hepatobiliary: Contrast is in the late arterial phase. Grossly preserved hepatic parenchyma. Dependent stone in the nondilated gallbladder. Patent portal vein. Pancreas: Unremarkable. No pancreatic ductal dilatation or surrounding inflammatory changes. Spleen: Normal in size without focal abnormality. Adrenals/Urinary Tract: Slight thickening of the adrenal glands, nonspecific. Malrotated right kidney. Mild bilateral renal atrophy. No enhancing renal mass or collecting system dilatation. Distended urinary bladder. Stomach/Bowel: No oral contrast. Significant stool in the rectum. Scattered colonic diverticula. No large bowel or small bowel dilatation. Stomach is relatively collapsed. Small bowel is nondilated. No free air or free fluid. Vascular/Lymphatic: Aortic atherosclerosis. No enlarged abdominal or pelvic lymph nodes. Reproductive: Uterus and bilateral adnexa are unremarkable. Other: Nonspecific presacral fat stranding. Diffuse anasarca identified as well. No free intra-air or free fluid. Musculoskeletal: Left-sided rectus muscle hematoma identified. This extends cephalocaudal for proximally 9 cm. Transverse dimensions of 9.3 by 4.4 cm. No active extravasation. Curvature and degenerative changes along the spine. Degenerative changes of the pelvis. IMPRESSION: Left-sided rectus muscle hematoma measuring up to 9.3 cm. No active extravasation. Multiple left-sided anterior possibly acute rib fractures. No underlying pneumothorax. Small right pleural effusion.  Tiny left Enlarged heart. Colonic diverticulosis. Significant stool in the rectum. No bowel obstruction, free air or fluid collection. Gallstone. Anasarca.  Nonspecific stranding in the presacral pelvis. Electronically Signed   By: Karen Kays M.D.   On: 10/13/2022 17:17   CT Head Wo Contrast  Result  Date: 10/13/2022 CLINICAL DATA:  Head trauma, minor (Age >= 65y); Facial trauma, blunt; Neck trauma (Age >= 65y) EXAM: CT HEAD WITHOUT CONTRAST CT MAXILLOFACIAL WITHOUT CONTRAST CT CERVICAL SPINE WITHOUT CONTRAST TECHNIQUE: Multidetector CT imaging of the head, cervical spine, and maxillofacial structures were performed using the standard protocol without intravenous contrast. Multiplanar CT image reconstructions of the cervical spine and maxillofacial structures were also generated. RADIATION DOSE REDUCTION: This exam was performed according to the departmental dose-optimization program which includes automated exposure control, adjustment of the mA and/or kV according to patient size and/or use of iterative reconstruction technique. COMPARISON:  None Available. FINDINGS: CT HEAD FINDINGS Brain: No evidence of acute infarction, hemorrhage, hydrocephalus, extra-axial collection. There is a 0.0 x 1.9 x 2.2 cm suprasellar mass with mass effect on the optic chiasm. 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 Sequela of moderate chronic microvascular ischemic change. Ventriculomegaly likely secondary to generalized volume loss. Vascular: No hyperdense vessel or unexpected calcification. Skull: Soft tissue hematoma along the left lateral frontal scalp. No evidence of underlying calvarial fracture. Other: None. CT MAXILLOFACIAL FINDINGS Osseous: No fracture or mandibular dislocation. No destructive process. Orbits: Negative. No traumatic or inflammatory finding. Sinuses: No middle ear or mastoid effusion. Paranasal sinuses are clear. Bilateral lens replacement. Orbits are otherwise unremarkable. Soft tissues: Soft tissue injury to the posterior auricular soft tissues on the left. CT CERVICAL SPINE FINDINGS Alignment:  Straightening of the normal cervical lordosis. Grade 1  anterolisthesis of C3 on C4 and C4 on C5. Skull base and vertebrae: No acute fracture. No primary bone lesion or focal pathologic process. Soft tissues and spinal canal: No prevertebral fluid or swelling. No visible canal hematoma. Disc levels:  No evidence of high-grade spinal canal stenosis. Upper chest: Negative. Other: None IMPRESSION: 1. Soft tissue hematoma along the left lateral frontal scalp. No evidence of underlying calvarial fracture or intracranial injury. 2. No acute facial bone fracture. 3. No acute cervical spine fracture. 4. There is a 0.0 x 1.9 x 2.2 cm suprasellar mass with mass effect on the optic chiasm. 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 Electronically Signed   By: Lorenza Cambridge M.D.   On: 10/13/2022 17:11   CT Cervical Spine Wo Contrast  Result Date: 10/13/2022 CLINICAL DATA:  Head trauma, minor (Age >= 65y); Facial trauma, blunt; Neck trauma (Age >= 65y) EXAM: CT HEAD WITHOUT CONTRAST CT MAXILLOFACIAL WITHOUT CONTRAST CT CERVICAL SPINE WITHOUT CONTRAST TECHNIQUE: Multidetector CT imaging of the head, cervical spine, and maxillofacial structures were performed using the standard protocol without intravenous contrast. Multiplanar CT image reconstructions of the cervical spine and maxillofacial structures were also generated. RADIATION DOSE REDUCTION: This exam was performed according to the departmental dose-optimization program which includes automated exposure control, adjustment of the mA and/or kV according to patient size and/or use of iterative reconstruction technique. COMPARISON:  None Available. FINDINGS: CT HEAD FINDINGS Brain: No evidence of acute infarction, hemorrhage, hydrocephalus, extra-axial collection. There is a 0.0 x 1.9 x 2.2 cm suprasellar mass with mass effect on the optic chiasm. This most likely represents a  pituitary a macrodenoma but if this is not been previously worked up a CTA of the head and neck is recommended to exclude the possibility of an ACOM aneurysm. Sequela of moderate chronic microvascular ischemic change. Ventriculomegaly likely secondary to generalized volume loss. Vascular: No hyperdense vessel or unexpected calcification. Skull: Soft tissue hematoma along the left lateral frontal scalp. No evidence of underlying calvarial fracture. Other: None. CT MAXILLOFACIAL FINDINGS Osseous: No fracture or mandibular dislocation. No destructive process. Orbits: Negative. No traumatic or inflammatory finding. Sinuses: No middle ear or mastoid effusion. Paranasal sinuses are clear. Bilateral lens replacement. Orbits are otherwise unremarkable. Soft tissues: Soft tissue injury to the posterior auricular soft tissues on the left. CT CERVICAL SPINE FINDINGS Alignment: Straightening of the normal cervical lordosis. Grade 1 anterolisthesis of C3 on C4 and C4 on C5. Skull base and vertebrae: No acute fracture. No primary bone lesion or focal pathologic process. Soft tissues and spinal canal: No prevertebral fluid or swelling. No visible canal hematoma. Disc levels:  No evidence of high-grade spinal canal stenosis. Upper chest: Negative. Other: None IMPRESSION: 1. Soft tissue hematoma along the left lateral frontal scalp. No evidence of underlying calvarial fracture or intracranial injury. 2. No acute facial bone fracture. 3. No acute cervical spine fracture. 4. There is a 0.0 x 1.9 x 2.2 cm suprasellar mass with mass effect on the optic chiasm. 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 Electronically Signed   By: Lorenza Cambridge M.D.   On: 10/13/2022 17:11   CT Maxillofacial Wo Contrast  Result Date: 10/13/2022 CLINICAL DATA:  Head trauma, minor (Age >= 65y); Facial trauma,  blunt; Neck trauma (Age >= 65y) EXAM: CT HEAD WITHOUT CONTRAST CT MAXILLOFACIAL WITHOUT CONTRAST CT CERVICAL SPINE WITHOUT CONTRAST TECHNIQUE: Multidetector CT imaging of the head, cervical spine, and maxillofacial structures were performed using the standard protocol without intravenous contrast. Multiplanar CT image reconstructions of the cervical spine and maxillofacial structures were also generated. RADIATION DOSE REDUCTION: This exam was performed according to the departmental dose-optimization program which includes automated exposure control, adjustment of the mA and/or kV according to patient size and/or use of iterative reconstruction technique. COMPARISON:  None Available. FINDINGS: CT HEAD FINDINGS Brain: No evidence of acute infarction, hemorrhage, hydrocephalus, extra-axial collection. There is a 0.0 x 1.9 x 2.2 cm suprasellar mass with mass effect on the optic chiasm. 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 Sequela of moderate chronic microvascular ischemic change. Ventriculomegaly likely secondary to generalized volume loss. Vascular: No hyperdense vessel or unexpected calcification. Skull: Soft tissue hematoma along the left lateral frontal scalp. No evidence of underlying calvarial fracture. Other: None. CT MAXILLOFACIAL FINDINGS Osseous: No fracture or mandibular dislocation. No destructive process. Orbits: Negative. No traumatic or inflammatory finding. Sinuses: No middle ear or mastoid effusion. Paranasal sinuses are clear. Bilateral lens replacement. Orbits are otherwise unremarkable. Soft tissues: Soft tissue injury to the posterior auricular soft tissues on the left. CT CERVICAL SPINE FINDINGS Alignment: Straightening of the normal cervical lordosis. Grade 1 anterolisthesis of C3 on C4 and C4 on C5. Skull base and vertebrae: No acute fracture. No  primary bone lesion or focal pathologic process. Soft tissues and spinal canal: No prevertebral fluid or swelling. No visible canal hematoma. Disc levels:  No evidence of high-grade spinal canal stenosis. Upper chest: Negative. Other: None IMPRESSION: 1. Soft tissue hematoma along the left lateral frontal scalp. No evidence of underlying calvarial fracture or intracranial injury. 2. No acute facial bone fracture. 3. No acute cervical spine fracture. 4. There is a 0.0 x 1.9 x 2.2 cm suprasellar mass with mass effect on the optic chiasm. 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 Electronically Signed   By: Lorenza Cambridge M.D.   On: 10/13/2022 17:11   DG Pelvis Portable  Result Date: 10/13/2022 CLINICAL DATA:  Fall. EXAM: PORTABLE PELVIS 1-2 VIEWS COMPARISON:  None Available. FINDINGS: There is no evidence of pelvic fracture or diastasis. No pelvic bone lesions are seen. IMPRESSION: Negative. Electronically Signed   By: Lupita Raider M.D.   On: 10/13/2022 16:37   DG Shoulder Left Portable  Result Date: 10/13/2022 CLINICAL DATA:  Fall. EXAM: LEFT SHOULDER COMPARISON:  October 17, 2013. FINDINGS: No definite fracture is noted. Old left rib fractures are noted. There does appear to be some elevation of the distal end of the left clavicle relative to acromion which was present on prior exam suggesting acromioclavicular joint separation. IMPRESSION: Stable elevation of distal end of left clavicle relative to the acromion suggesting stable old acromioclavicular joint separation. No acute fracture or dislocation is noted. Electronically Signed   By: Lupita Raider M.D.   On: 10/13/2022 16:37   DG Elbow 2 Views Left  Result Date: 10/13/2022 CLINICAL DATA:  Fall. EXAM: LEFT ELBOW - 2 VIEW COMPARISON:  None Available. FINDINGS: There is no evidence of fracture, dislocation,  or joint effusion. There is no evidence of arthropathy or other focal bone abnormality. Soft tissues are unremarkable. IMPRESSION: Negative. Electronically Signed   By: Lupita Raider M.D.   On: 10/13/2022 16:35   DG Knee 2 Views Left  Result Date: 10/13/2022 CLINICAL DATA:  Left knee pain after fall. EXAM: LEFT KNEE - 1-2 VIEW COMPARISON:  None Available. FINDINGS: No evidence of fracture, dislocation, or joint effusion. No evidence of arthropathy or other focal bone abnormality. Vascular calcifications are noted. IMPRESSION: No significant abnormality seen in the left knee. Electronically Signed   By: Lupita Raider M.D.   On: 10/13/2022 16:33   DG Hip Unilat W or Wo Pelvis 2-3 Views Left  Result Date: 10/13/2022 CLINICAL DATA:  Status post fall.  Rule out fracture. EXAM: DG HIP (WITH OR WITHOUT PELVIS) 2-3V LEFT COMPARISON:  None Available. FINDINGS: There is no evidence of hip fracture or dislocation. There is cortical irregularity of the superior pubic ramus on the left, obscured by formed stool in the rectum. IMPRESSION: 1. No evidence of hip fracture or dislocation. 2. Cortical irregularity of the superior pubic ramus on the left, obscured by formed stool in the rectum. Consider further evaluation with dedicated pelvic radiograph or CT. Electronically Signed   By: Ted Mcalpine M.D.   On: 10/13/2022 16:32   DG Chest Port 1 View  Result Date: 10/13/2022 CLINICAL DATA:  Trauma. EXAM: PORTABLE CHEST 1 VIEW COMPARISON:  Sep 07, 2022 FINDINGS: Cardiomediastinal silhouette is enlarged. Mediastinal contours appear intact. Possible interstitial pulmonary edema with small bilateral pleural effusions. Osseous structures are without acute abnormality. Soft tissues are grossly normal. IMPRESSION: Possible interstitial pulmonary edema with small bilateral pleural effusions. Electronically Signed   By: Ted Mcalpine M.D.   On: 10/13/2022 16:30    Anti-infectives: Anti-infectives (From  admission, onward)    None        Assessment/Plan  89yoF s/p fall Left 2-5 rib fractures - IS 10x/hr while awake; multidmodal pain control, wean O2 as able Rectus sheath hematoma - monitor abdominal exam, no extrav on CT; hgb stable 8.9 > 9.7 >9.6 Left ear lac - repaired by EDP with suture Left forehead lac - repaired by EDP with dermabond Chronic constipation - based on CT, large rectal stool burden. Bowel regimen, Miralax BID and senna BID    Afib - hold anticoagulation   PPX: SCDs; holding chemical dvt ppx in light of onboard anticoagulation, rectus sheath hematoma; plan to resume lovenox tomorrow if  hgb remains stable. FEN: soft diet, SLIV ID: none Dispo - 4NP, Pt/OT over the weekend, anticipate discharge to ALF Monday.   LOS: 2 days   I reviewed nursing notes, hospitalist notes, last 24 h vitals and pain scores, last 48 h intake and output, last 24 h labs and trends, and last 24 h imaging results.  This care required moderate level of medical decision making.   Hosie Spangle, PA-C [facility] Please see Amion for pager number during day hours 7:00am-4:30pm

## 2022-10-15 NOTE — [place] FL2 (Signed)
[place] MEDICAID FL2 LEVEL OF CARE FORM     IDENTIFICATION  Patient Name: Diana [name] Birthdate: 09/08/1923 Sex: female Admission Date (Current Location): 10/13/2022  [facility] and [place] Number:  Producer, television/film/video and Address:  [facility], [address], [place], [place] [address]      Provider Number: [id number]  Attending Physician Name and Address:  Violeta Gelinas, MD  Relative Name and Phone Number:  Everett Asaro, phone: [phone]    Current Level of Care: Hospital Recommended Level of Care: Assisted Living Facility Prior Approval Number:    Date Approved/Denied:   PASRR Number:    Discharge Plan: Other (Comment) (Assisted Living Facility)    Current Diagnoses: Patient Active Problem List   Diagnosis Date Noted   Hypokalemia 10/14/2022   Rib fractures 10/13/2022   Congestive heart failure (HCC) 09/09/2022   Dilated cardiomyopathy (HCC) 09/09/2022   Acute HFrEF (heart failure with reduced ejection fraction) (HCC) 09/08/2022   Fluid overload 09/03/2022   Troponin I above reference range 09/03/2022   Atrial fibrillation (HCC) 09/03/2022   Cellulitis of lower extremity 08/27/2022   Venous stasis ulcers of both lower extremities (HCC) 08/27/2022   Peripheral edema 06/30/2022   Hematoma 04/13/2022   Other chronic pain 06/26/2021   Burning with urination 03/01/2018   Iron deficiency anemia due to chronic blood loss 06/16/2017   Lymphedema of right arm 11/17/2016   Vaginal irritation 10/26/2016   Right-sided low back pain without sciatica 08/22/2015   Spinal stenosis in cervical region 08/07/2015   Cervical spondylosis with myelopathy 06/26/2015   Aortic valve calcification 06/03/2015   BPV (benign positional vertigo) 01/29/2015   Poor balance 10/07/2011   History of right breast cancer 03/29/2011   Vitamin D deficiency 10/17/2009   Prediabetes 10/17/2009   COPD (chronic obstructive pulmonary disease) (HCC) 12/19/2007    INSOMNIA, CHRONIC 10/25/2007   BACK PAIN, LUMBAR 03/01/2007   Hypothyroidism 01/18/2007   HYPONATREMIA, MILD 01/18/2007   Essential hypertension 01/18/2007    Orientation RESPIRATION BLADDER Height & Weight     Self, Time, Situation, Place  Normal External catheter Weight: 72.9 kg Height:  5\' 4"  (162.6 cm)  BEHAVIORAL SYMPTOMS/MOOD NEUROLOGICAL BOWEL NUTRITION STATUS      Continent Diet (soft, thin liquids)  AMBULATORY STATUS COMMUNICATION OF NEEDS Skin   Limited Assist (Uses rolling walker) Verbally Bruising (bruising bilateral arms, eyes and legs)                       Personal Care Assistance Level of Assistance  Bathing Bathing Assistance: Limited assistance (Patient will need supervision with all bathing and toileting) Feeding assistance: Limited assistance       Functional Limitations Info             SPECIAL CARE FACTORS FREQUENCY  PT (By licensed PT), OT (By licensed OT)     PT Frequency: 5x weekly OT Frequency: 5x weekly            Contractures Contractures Info: Not present    Additional Factors Info  Code Status, Allergies Code Status Info: DNR Allergies Info: Meperidine HCL-Nausea/Vomiting           Current Medications (10/15/2022):  This is the current hospital active medication list Current Facility-Administered Medications  Medication Dose Route Frequency Provider Last Rate Last Admin   acetaminophen (TYLENOL) tablet 1,000 mg  1,000 mg Oral Q6H Andria Meuse, MD   1,000 mg at 10/15/22 1238  albuterol (PROVENTIL) (2.5 MG/3ML) 0.083% nebulizer solution 2.5 mg  2.5 mg Nebulization Q6H PRN Pokhrel, Laxman, MD       aspirin EC tablet 81 mg  81 mg Oral Daily Andria Meuse, MD   81 mg at 10/15/22 0942   atorvastatin (LIPITOR) tablet 40 mg  40 mg Oral Daily Andria Meuse, MD   40 mg at 10/15/22 0941   docusate sodium (COLACE) capsule 100 mg  100 mg Oral BID Andria Meuse, MD   100 mg at 10/15/22 9562   furosemide  (LASIX) tablet 20 mg  20 mg Oral Daily Andria Meuse, MD   20 mg at 10/15/22 0941   hydrALAZINE (APRESOLINE) injection 10 mg  10 mg Intravenous Q2H PRN Andria Meuse, MD       HYDROmorphone (DILAUDID) injection 0.5 mg  0.5 mg Intravenous Q3H PRN Andria Meuse, MD       levothyroxine (SYNTHROID) tablet 100 mcg  100 mcg Oral Daily Andria Meuse, MD   100 mcg at 10/15/22 0600   lidocaine (LIDODERM) 5 % 1 patch  1 patch Transdermal Q24H Alan Mulder, MD   1 patch at 10/14/22 2118   methocarbamol (ROBAXIN) tablet 500 mg  500 mg Oral Q8H Andria Meuse, MD   500 mg at 10/15/22 1238   Or   methocarbamol (ROBAXIN) 500 mg in dextrose 5 % 50 mL IVPB  500 mg Intravenous Q8H Andria Meuse, MD       metoprolol succinate (TOPROL-XL) 24 hr tablet 25 mg  25 mg Oral BID Andria Meuse, MD   25 mg at 10/15/22 0942   metoprolol tartrate (LOPRESSOR) injection 5 mg  5 mg Intravenous Q6H PRN Andria Meuse, MD       ondansetron (ZOFRAN-ODT) disintegrating tablet 4 mg  4 mg Oral Q6H PRN Andria Meuse, MD       Or   ondansetron [facility]) injection 4 mg  4 mg Intravenous Q6H PRN Andria Meuse, MD       oxyCODONE (Oxy IR/ROXICODONE) immediate release tablet 5 mg  5 mg Oral Q6H PRN Andria Meuse, MD   5 mg at 10/15/22 0942   polyethylene glycol (MIRALAX / GLYCOLAX) packet 17 g  17 g Oral Daily PRN Andria Meuse, MD   17 g at 10/14/22 1758   senna (SENOKOT) tablet 8.6 mg  1 tablet Oral Daily PRN Andria Meuse, MD   8.6 mg at 10/14/22 1758   tamsulosin (FLOMAX) capsule 0.4 mg  0.4 mg Oral Daily Juliet Rude, PA-C   0.4 mg at 10/15/22 1308   traZODone (DESYREL) tablet 25 mg  25 mg Oral QHS PRN Andria Meuse, MD   25 mg at 10/14/22 2116     Discharge Medications: Please see discharge summary for a list of discharge medications.  Relevant Imaging Results:  Relevant Lab Results:   Additional Information SSN:  [id number]  Quintella Baton, RN, BSN  Trauma/Neuro ICU Case Manager [phone]

## 2022-10-15 NOTE — Progress Notes (Addendum)
PROGRESS NOTE    Diana [name]  ZOX:[id number] DOB: [birth year] DOA: 10/13/2022 PCP: Excell Seltzer, MD    Brief Narrative:  Ms Knoblock is a 89 year old female with past medical history of obstructive sleep apnea, hypertension, hypothyroidism, COPD who presented after mechanical fall.  Workup in the emergency department revealed rib fractures as well as scalp hematoma.  Patient was admitted to the trauma service for further workup.  Medicine team was consulted for medication management.  Assessment and plan.  Fall with Left scalp hematoma, Left-sided rib fractures -Management per trauma team.  Pain management.  Physical therapy evaluation recommend home health..  Incentive spirometry.  Rectus sheath hematoma.  Being managed conservatively.  Monitor CBC.Hemoglobin today at 9.6.  CAD- continue aspirin, Lipitor, metoprolol no active chest pain.   Concern for cranial aneurysm-incidental finding noted on CT head scan.  Radiology has recommended  CTA head and neck to rule out ACOM aneurysm.  I discussed this with the patient's son at bedside who is worried about this since his father also died from aneurysm.  He would like to know more about it.  Will order CTA of the head and neck.   Hyperlipidemia-continue Lipitor   History of chronic systolic heart failure-continue Lasix, metoprolol.  Intake and output charting, daily weights.  Review of previous TTE in May showed EF 25%. Marland Kitchen   Hypothyroidism-continue Synthroid   Insomnia-continue trazodone    Hyponatremia likely related to hypervolemia- continue lasix, continue to monitor.  Sodium level at 132 today.   Hypokalemia-replenished yesterday.  Improved to 3.7 today.  Chronic constipation.  Continue bowel regimen.  Has had bowel movements.  History of atrial fibrillation.  Anticoagulation hold.  Continue SCD.  Hemoglobin today at 9.6 from 9.7..       DVT prophylaxis: SCDs Start: 10/13/22 2116   Code Status:     Code Status:  DNR  Disposition: As per primary team.  Likely home health on discharge.  Status is: Inpatient   Family Communication: Spoke with the patient's son at bedside.  Procedures:  None  Antimicrobials:  None  Anti-infectives (From admission, onward)    None       Subjective: Today, patient was seen and examined at bedside.  Hard of hearing.  Patient to be more alert awake and Communicative.  Patient's son at bedside.  Denies any nausea vomiting headache.  Had bowel movement.  Objective: Vitals:   10/15/22 0200 10/15/22 0334 10/15/22 0400 10/15/22 0730  BP: (!) 101/51 94/72 105/71 97/69  Pulse: (!) 101 (!) 120 97 97  Resp: (!) 7 20 20 16   Temp:  (!) 97.4 F (36.3 C)  (!) 97.3 F (36.3 C)  TempSrc:  Oral  Oral  SpO2: 93% 94% 96% 95%  Weight:      Height:        Intake/Output Summary (Last 24 hours) at 10/15/2022 1027 Last data filed at 10/15/2022 0830 Gross per 24 hour  Intake 540 ml  Output 1275 ml  Net -735 ml   Filed Weights   10/13/22 1632 10/14/22 1400  Weight: 74.4 kg 72.9 kg    Physical Examination: Body mass index is 27.59 kg/m.   General:  Average built, not in obvious distress, elderly female, appears weak and deconditioned, HENT:   Moist mucosa.  Left periorbital ecchymosis.  Left forehead laceration status postrepair. Chest.  Diminished breath sounds bilaterally.  Tenderness over the left chest wall. CVS: S1 &S2 heard. No murmur.  Regular rate and rhythm. Abdomen: Soft, nontender,  nondistended.  Bowel sounds are heard.  Left abdomen palpable fullness. Extremities: No cyanosis, clubbing or edema.  Peripheral pulses are palpable. Psych: Alert, awake and Communicative, CNS:  No cranial nerve deficits.  Moves extremities. Skin: Warm and dry.  Bruises noted over the skin.  Data Reviewed:   CBC: Recent Labs  Lab 10/13/22 1611 10/13/22 1614 10/14/22 0348 10/14/22 1333 10/15/22 0435  WBC 6.1  --  7.1 6.5 5.9  HGB 9.7* 10.5* 8.9* 9.7* 9.6*  HCT  31.4* 31.0* 27.8* 30.5* 30.6*  MCV 107.2*  --  102.2* 105.2* 104.4*  PLT 183  --  175 177 163     Basic Metabolic Panel: Recent Labs  Lab 10/13/22 1611 10/13/22 1614 10/14/22 0348 10/15/22 0435  NA 132* 134* 132* 132*  K 3.1* 3.1* 3.3* 3.7  CL 97* 95* 97* 98  CO2 24  --  26 24  GLUCOSE 110* 108* 84 85  BUN 24* 25* 22 18  CREATININE 1.07* 1.10* 0.99 1.04*  CALCIUM 8.5*  --  8.4* 8.6*  MG  --   --   --  2.2     Liver Function Tests: Recent Labs  Lab 10/13/22 1611  AST 21  ALT 15  ALKPHOS 86  BILITOT 1.1  PROT 6.3*  ALBUMIN 3.2*      Radiology Studies: CT CHEST ABDOMEN PELVIS W CONTRAST  Result Date: 10/13/2022 CLINICAL DATA:  Pain after fall.  On Eliquis EXAM: CT CHEST, ABDOMEN, AND PELVIS WITH CONTRAST TECHNIQUE: Multidetector CT imaging of the chest, abdomen and pelvis was performed following the standard protocol during bolus administration of intravenous contrast. RADIATION DOSE REDUCTION: This exam was performed according to the departmental dose-optimization program which includes automated exposure control, adjustment of the mA and/or kV according to patient size and/or use of iterative reconstruction technique. CONTRAST:  75mL OMNIPAQUE IOHEXOL 350 MG/ML SOLN COMPARISON:  None Available. FINDINGS: CT CHEST FINDINGS Cardiovascular: Heart is enlarged particularly the right atrium. No pericardial effusion. There are calcifications in the area of the aortic valve. The thoracic aorta overall has a normal course and caliber with some scattered vascular calcifications. Coronary artery calcifications are seen. Is also significant calcifications along the great vessels. No mediastinal hematoma. Mediastinum/Nodes: No specific abnormal lymph node enlargement identified in the axillary regions, hilum or mediastinum. Normal caliber thoracic esophagus. Heterogeneous thyroid. Lungs/Pleura: There is a small right pleural effusion. Trace left. Breathing motion seen throughout the  examination. There are some areas of bandlike changes along the lung bases likely scar or atelectasis. There are some ill-defined areas of ground-glass in both lungs as well. Please correlate for any evidence of air trapping. Mild bronchial wall thickening identified in several locations. No pneumothorax. Musculoskeletal: Scattered degenerative changes along the spine. Degenerative changes of the shoulders. Fractures involving the anterior aspect of the left second, third, fourth, fifth ribs. Based on appearance these are age-indeterminate. Please correlate to point tenderness. CT ABDOMEN PELVIS FINDINGS Hepatobiliary: Contrast is in the late arterial phase. Grossly preserved hepatic parenchyma. Dependent stone in the nondilated gallbladder. Patent portal vein. Pancreas: Unremarkable. No pancreatic ductal dilatation or surrounding inflammatory changes. Spleen: Normal in size without focal abnormality. Adrenals/Urinary Tract: Slight thickening of the adrenal glands, nonspecific. Malrotated right kidney. Mild bilateral renal atrophy. No enhancing renal mass or collecting system dilatation. Distended urinary bladder. Stomach/Bowel: No oral contrast. Significant stool in the rectum. Scattered colonic diverticula. No large bowel or small bowel dilatation. Stomach is relatively collapsed. Small bowel is nondilated. No free air or free  fluid. Vascular/Lymphatic: Aortic atherosclerosis. No enlarged abdominal or pelvic lymph nodes. Reproductive: Uterus and bilateral adnexa are unremarkable. Other: Nonspecific presacral fat stranding. Diffuse anasarca identified as well. No free intra-air or free fluid. Musculoskeletal: Left-sided rectus muscle hematoma identified. This extends cephalocaudal for proximally 9 cm. Transverse dimensions of 9.3 by 4.4 cm. No active extravasation. Curvature and degenerative changes along the spine. Degenerative changes of the pelvis. IMPRESSION: Left-sided rectus muscle hematoma measuring up to  9.3 cm. No active extravasation. Multiple left-sided anterior possibly acute rib fractures. No underlying pneumothorax. Small right pleural effusion.  Tiny left Enlarged heart. Colonic diverticulosis. Significant stool in the rectum. No bowel obstruction, free air or fluid collection. Gallstone. Anasarca.  Nonspecific stranding in the presacral pelvis. Electronically Signed   By: Karen Kays M.D.   On: 10/13/2022 17:17   CT Head Wo Contrast  Result Date: 10/13/2022 CLINICAL DATA:  Head trauma, minor (Age >= 65y); Facial trauma, blunt; Neck trauma (Age >= 65y) EXAM: CT HEAD WITHOUT CONTRAST CT MAXILLOFACIAL WITHOUT CONTRAST CT CERVICAL SPINE WITHOUT CONTRAST TECHNIQUE: Multidetector CT imaging of the head, cervical spine, and maxillofacial structures were performed using the standard protocol without intravenous contrast. Multiplanar CT image reconstructions of the cervical spine and maxillofacial structures were also generated. RADIATION DOSE REDUCTION: This exam was performed according to the departmental dose-optimization program which includes automated exposure control, adjustment of the mA and/or kV according to patient size and/or use of iterative reconstruction technique. COMPARISON:  None Available. FINDINGS: CT HEAD FINDINGS Brain: No evidence of acute infarction, hemorrhage, hydrocephalus, extra-axial collection. There is a 0.0 x 1.9 x 2.2 cm suprasellar mass with mass effect on the optic chiasm. 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 Sequela of moderate chronic microvascular ischemic change. Ventriculomegaly likely secondary to generalized volume loss. Vascular: No hyperdense vessel or unexpected calcification. Skull: Soft tissue hematoma along the left lateral frontal scalp. No evidence of underlying calvarial fracture. Other: None. CT MAXILLOFACIAL  FINDINGS Osseous: No fracture or mandibular dislocation. No destructive process. Orbits: Negative. No traumatic or inflammatory finding. Sinuses: No middle ear or mastoid effusion. Paranasal sinuses are clear. Bilateral lens replacement. Orbits are otherwise unremarkable. Soft tissues: Soft tissue injury to the posterior auricular soft tissues on the left. CT CERVICAL SPINE FINDINGS Alignment: Straightening of the normal cervical lordosis. Grade 1 anterolisthesis of C3 on C4 and C4 on C5. Skull base and vertebrae: No acute fracture. No primary bone lesion or focal pathologic process. Soft tissues and spinal canal: No prevertebral fluid or swelling. No visible canal hematoma. Disc levels:  No evidence of high-grade spinal canal stenosis. Upper chest: Negative. Other: None IMPRESSION: 1. Soft tissue hematoma along the left lateral frontal scalp. No evidence of underlying calvarial fracture or intracranial injury. 2. No acute facial bone fracture. 3. No acute cervical spine fracture. 4. There is a 0.0 x 1.9 x 2.2 cm suprasellar mass with mass effect on the optic chiasm. 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 Electronically Signed   By: Lorenza Cambridge M.D.   On: 10/13/2022 17:11   CT Cervical Spine Wo Contrast  Result Date: 10/13/2022 CLINICAL DATA:  Head trauma, minor (Age >= 65y); Facial trauma, blunt; Neck trauma (Age >=  65y) EXAM: CT HEAD WITHOUT CONTRAST CT MAXILLOFACIAL WITHOUT CONTRAST CT CERVICAL SPINE WITHOUT CONTRAST TECHNIQUE: Multidetector CT imaging of the head, cervical spine, and maxillofacial structures were performed using the standard protocol without intravenous contrast. Multiplanar CT image reconstructions of the cervical spine and maxillofacial structures were also generated. RADIATION DOSE REDUCTION: This exam was performed according to the  departmental dose-optimization program which includes automated exposure control, adjustment of the mA and/or kV according to patient size and/or use of iterative reconstruction technique. COMPARISON:  None Available. FINDINGS: CT HEAD FINDINGS Brain: No evidence of acute infarction, hemorrhage, hydrocephalus, extra-axial collection. There is a 0.0 x 1.9 x 2.2 cm suprasellar mass with mass effect on the optic chiasm. 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 Sequela of moderate chronic microvascular ischemic change. Ventriculomegaly likely secondary to generalized volume loss. Vascular: No hyperdense vessel or unexpected calcification. Skull: Soft tissue hematoma along the left lateral frontal scalp. No evidence of underlying calvarial fracture. Other: None. CT MAXILLOFACIAL FINDINGS Osseous: No fracture or mandibular dislocation. No destructive process. Orbits: Negative. No traumatic or inflammatory finding. Sinuses: No middle ear or mastoid effusion. Paranasal sinuses are clear. Bilateral lens replacement. Orbits are otherwise unremarkable. Soft tissues: Soft tissue injury to the posterior auricular soft tissues on the left. CT CERVICAL SPINE FINDINGS Alignment: Straightening of the normal cervical lordosis. Grade 1 anterolisthesis of C3 on C4 and C4 on C5. Skull base and vertebrae: No acute fracture. No primary bone lesion or focal pathologic process. Soft tissues and spinal canal: No prevertebral fluid or swelling. No visible canal hematoma. Disc levels:  No evidence of high-grade spinal canal stenosis. Upper chest: Negative. Other: None IMPRESSION: 1. Soft tissue hematoma along the left lateral frontal scalp. No evidence of underlying calvarial fracture or intracranial injury. 2. No acute facial bone fracture. 3. No acute cervical spine fracture. 4. There is a 0.0 x  1.9 x 2.2 cm suprasellar mass with mass effect on the optic chiasm. 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 Electronically Signed   By: Lorenza Cambridge M.D.   On: 10/13/2022 17:11   CT Maxillofacial Wo Contrast  Result Date: 10/13/2022 CLINICAL DATA:  Head trauma, minor (Age >= 65y); Facial trauma, blunt; Neck trauma (Age >= 65y) EXAM: CT HEAD WITHOUT CONTRAST CT MAXILLOFACIAL WITHOUT CONTRAST CT CERVICAL SPINE WITHOUT CONTRAST TECHNIQUE: Multidetector CT imaging of the head, cervical spine, and maxillofacial structures were performed using the standard protocol without intravenous contrast. Multiplanar CT image reconstructions of the cervical spine and maxillofacial structures were also generated. RADIATION DOSE REDUCTION: This exam was performed according to the departmental dose-optimization program which includes automated exposure control, adjustment of the mA and/or kV according to patient size and/or use of iterative reconstruction technique. COMPARISON:  None Available. FINDINGS: CT HEAD FINDINGS Brain: No evidence of acute infarction, hemorrhage, hydrocephalus, extra-axial collection. There is a 0.0 x 1.9 x 2.2 cm suprasellar mass with mass effect on the optic chiasm. 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 Sequela of moderate chronic microvascular ischemic change. Ventriculomegaly likely secondary to  generalized volume loss. Vascular: No hyperdense vessel or unexpected calcification. Skull: Soft tissue hematoma along the left lateral frontal scalp. No evidence of underlying calvarial fracture. Other: None. CT MAXILLOFACIAL FINDINGS Osseous: No fracture or mandibular dislocation. No destructive process. Orbits:  Negative. No traumatic or inflammatory finding. Sinuses: No middle ear or mastoid effusion. Paranasal sinuses are clear. Bilateral lens replacement. Orbits are otherwise unremarkable. Soft tissues: Soft tissue injury to the posterior auricular soft tissues on the left. CT CERVICAL SPINE FINDINGS Alignment: Straightening of the normal cervical lordosis. Grade 1 anterolisthesis of C3 on C4 and C4 on C5. Skull base and vertebrae: No acute fracture. No primary bone lesion or focal pathologic process. Soft tissues and spinal canal: No prevertebral fluid or swelling. No visible canal hematoma. Disc levels:  No evidence of high-grade spinal canal stenosis. Upper chest: Negative. Other: None IMPRESSION: 1. Soft tissue hematoma along the left lateral frontal scalp. No evidence of underlying calvarial fracture or intracranial injury. 2. No acute facial bone fracture. 3. No acute cervical spine fracture. 4. There is a 0.0 x 1.9 x 2.2 cm suprasellar mass with mass effect on the optic chiasm. 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 Electronically Signed   By: Lorenza Cambridge M.D.   On: 10/13/2022 17:11   DG Pelvis Portable  Result Date: 10/13/2022 CLINICAL DATA:  Fall. EXAM: PORTABLE PELVIS 1-2 VIEWS COMPARISON:  None Available. FINDINGS: There is no evidence of pelvic fracture or diastasis. No pelvic bone lesions are seen. IMPRESSION: Negative. Electronically Signed   By: Lupita Raider M.D.   On: 10/13/2022 16:37   DG Shoulder Left Portable  Result Date: 10/13/2022 CLINICAL DATA:  Fall. EXAM: LEFT SHOULDER COMPARISON:  October 17, 2013. FINDINGS: No definite fracture is noted. Old left rib fractures are noted. There does appear to be some elevation of the distal end of the left clavicle relative to acromion which was present on prior exam suggesting acromioclavicular joint  separation. IMPRESSION: Stable elevation of distal end of left clavicle relative to the acromion suggesting stable old acromioclavicular joint separation. No acute fracture or dislocation is noted. Electronically Signed   By: Lupita Raider M.D.   On: 10/13/2022 16:37   DG Elbow 2 Views Left  Result Date: 10/13/2022 CLINICAL DATA:  Fall. EXAM: LEFT ELBOW - 2 VIEW COMPARISON:  None Available. FINDINGS: There is no evidence of fracture, dislocation, or joint effusion. There is no evidence of arthropathy or other focal bone abnormality. Soft tissues are unremarkable. IMPRESSION: Negative. Electronically Signed   By: Lupita Raider M.D.   On: 10/13/2022 16:35   DG Knee 2 Views Left  Result Date: 10/13/2022 CLINICAL DATA:  Left knee pain after fall. EXAM: LEFT KNEE - 1-2 VIEW COMPARISON:  None Available. FINDINGS: No evidence of fracture, dislocation, or joint effusion. No evidence of arthropathy or other focal bone abnormality. Vascular calcifications are noted. IMPRESSION: No significant abnormality seen in the left knee. Electronically Signed   By: Lupita Raider M.D.   On: 10/13/2022 16:33   DG Hip Unilat W or Wo Pelvis 2-3 Views Left  Result Date: 10/13/2022 CLINICAL DATA:  Status post fall.  Rule out fracture. EXAM: DG HIP (WITH OR WITHOUT PELVIS) 2-3V LEFT COMPARISON:  None Available. FINDINGS: There is no evidence of hip fracture or dislocation. There is cortical irregularity of the  superior pubic ramus on the left, obscured by formed stool in the rectum. IMPRESSION: 1. No evidence of hip fracture or dislocation. 2. Cortical irregularity of the superior pubic ramus on the left, obscured by formed stool in the rectum. Consider further evaluation with dedicated pelvic radiograph or CT. Electronically Signed   By: Ted Mcalpine M.D.   On: 10/13/2022 16:32   DG Chest Port 1 View  Result Date: 10/13/2022 CLINICAL DATA:  Trauma. EXAM: PORTABLE CHEST 1 VIEW COMPARISON:  Sep 07, 2022 FINDINGS:  Cardiomediastinal silhouette is enlarged. Mediastinal contours appear intact. Possible interstitial pulmonary edema with small bilateral pleural effusions. Osseous structures are without acute abnormality. Soft tissues are grossly normal. IMPRESSION: Possible interstitial pulmonary edema with small bilateral pleural effusions. Electronically Signed   By: Ted Mcalpine M.D.   On: 10/13/2022 16:30      LOS: 2 days   Joycelyn Das, MD [facility] Available via Epic secure chat 7am-7pm After these hours, please refer to coverage provider listed on [email or web address] 10/15/2022, 10:27 AM

## 2022-10-15 NOTE — Care Management Important Message (Signed)
Important Message  Patient Details  Name: Diana [name] MRN: [id number] Date of Birth: [birth year]   Medicare Important Message Given:  Yes     Sherilyn Banker 10/15/2022, 12:18 PM

## 2022-10-16 DIAGNOSIS — S2242XA Multiple fractures of ribs, left side, initial encounter for closed fracture: Secondary | ICD-10-CM | POA: Diagnosis not present

## 2022-10-16 DIAGNOSIS — J449 Chronic obstructive pulmonary disease, unspecified: Secondary | ICD-10-CM | POA: Diagnosis not present

## 2022-10-16 DIAGNOSIS — I10 Essential (primary) hypertension: Secondary | ICD-10-CM | POA: Diagnosis not present

## 2022-10-16 DIAGNOSIS — E876 Hypokalemia: Secondary | ICD-10-CM | POA: Diagnosis not present

## 2022-10-16 LAB — CBC
HCT: 30.5 % — ABNORMAL LOW (ref 36.0–46.0)
Hemoglobin: 9.7 g/dL — ABNORMAL LOW (ref 12.0–15.0)
MCH: 33.7 pg (ref 26.0–34.0)
MCHC: 31.8 g/dL (ref 30.0–36.0)
MCV: 105.9 fL — ABNORMAL HIGH (ref 80.0–100.0)
Platelets: 167 10*3/uL (ref 150–400)
RBC: 2.88 MIL/uL — ABNORMAL LOW (ref 3.87–5.11)
RDW: 18.8 % — ABNORMAL HIGH (ref 11.5–15.5)
WBC: 6.3 10*3/uL (ref 4.0–10.5)
nRBC: 0 % (ref 0.0–0.2)

## 2022-10-16 LAB — BASIC METABOLIC PANEL
Anion gap: 11 (ref 5–15)
BUN: 19 mg/dL (ref 8–23)
CO2: 22 mmol/L (ref 22–32)
Calcium: 8.6 mg/dL — ABNORMAL LOW (ref 8.9–10.3)
Chloride: 96 mmol/L — ABNORMAL LOW (ref 98–111)
Creatinine, Ser: 1.09 mg/dL — ABNORMAL HIGH (ref 0.44–1.00)
GFR, Estimated: 46 mL/min — ABNORMAL LOW (ref >60)
Glucose, Bld: 107 mg/dL — ABNORMAL HIGH (ref 70–99)
Potassium: 3.9 mmol/L (ref 3.5–5.1)
Sodium: 129 mmol/L — ABNORMAL LOW (ref 135–145)

## 2022-10-16 LAB — MAGNESIUM: Magnesium: 2.2 mg/dL (ref 1.7–2.4)

## 2022-10-16 MED ORDER — ENOXAPARIN SODIUM 30 MG/0.3ML IJ SOSY
30.0000 mg | PREFILLED_SYRINGE | Freq: Two times a day (BID) | INTRAMUSCULAR | Status: DC
  Administered 2022-10-16 – 2022-10-17 (×4): 30 mg via SUBCUTANEOUS
  Filled 2022-10-16 (×4): qty 0.3

## 2022-10-16 NOTE — Progress Notes (Signed)
Physical Therapy Treatment Patient Details Name: Diana [name] MRN: [id number] DOB: Aug 03, 1923 Today's Date: 10/16/2022   History of Present Illness Pt is a 89 year old admitted 10/13/22 after mechanical fall. Dx with rib fractures L 2-5, scalp laceration and hematoma and small R PE. PMH includes chronic hearing loss, progressive bilateral lower extremity weakness, atrial fibrillation, essential hypertension -recently discharged from [facility] 09/06/2022 with cellulitis of the lower extremity.  She was discharged to a rehab facility where she was for a few weeks.  She subsequently was transferred to ALF 6/26 where she fell in the bathroom.    PT Comments    Patient not progressing due to reports of nausea and needing increased time and encouragement to participate.  She declined PT previous date x 3 due to up with nursing x 2 and fatigued so encouraged today for mobility and able to get up to EOB though with increased time and assistance and needing seated rest even for short distance ambulation in the room.  Initial balance was posterior requiring more help with sit to stand from EOB.  She may not be safe to d/c straight to ALF as likely to fall again if getting up unaided.  Spoke with son via phone regarding updated recommendations and he is hopeful for her to return to [facility] or [facility] or [facility].  PT will follow up.    Recommendations for follow up therapy are one component of a multi-disciplinary discharge planning process, led by the attending physician.  Recommendations may be updated based on patient status, additional functional criteria and insurance authorization.  Follow Up Recommendations  Can patient physically be transported by private vehicle: Yes    Assistance Recommended at Discharge Frequent or constant Supervision/Assistance  Patient can return home with the following A little help with walking and/or transfers;A little help with bathing/dressing/bathroom;Assistance with  cooking/housework;Assist for transportation;Direct supervision/assist for medications management   Equipment Recommendations  None recommended by PT    Recommendations for Other Services       Precautions / Restrictions Precautions Precautions: Fall     Mobility  Bed Mobility Overal bed mobility: Needs Assistance Bed Mobility: Sidelying to Sit   Sidelying to sit: Min assist       General bed mobility comments: increased time and some lifting help for trunk    Transfers Overall transfer level: Needs assistance Equipment used: Rolling walker (2 wheels) Transfers: Sit to/from Stand Sit to Stand: Min assist           General transfer comment: cues for hand placement, some lifting help to stand    Ambulation/Gait Ambulation/Gait assistance: Min assist Gait Distance (Feet): 10 Feet (& 20') Assistive device: Rolling walker (2 wheels) Gait Pattern/deviations: Step-through pattern, Trunk flexed, Decreased stride length       General Gait Details: min A throughout for balance today with chair follow to try to increase distance though pt somewhat nauseated and not feeling well; wanted to sit after about 10' then after resting in chair went about another 20' HR up to 141 max, maintained in 120's-130's during mobility   Stairs             Wheelchair Mobility    Modified Rankin (Stroke Patients Only)       Balance Overall balance assessment: Needs assistance   Sitting balance-Leahy Scale: Good     Standing balance support: Bilateral upper extremity supported, Reliant on assistive device for balance Standing balance-Leahy Scale: Poor Standing balance comment: needing UE support during session, more  posterior bias initially standing this session                            Cognition Arousal/Alertness: Awake/alert Behavior During Therapy: WFL for tasks assessed/performed Overall Cognitive Status: Impaired/Different from baseline Area of  Impairment: Attention, Problem solving                   Current Attention Level: Sustained         Problem Solving: Slow processing, Decreased initiation, Requires verbal cues General Comments: increased time today and not willing to discuss d/c plans stating "I just can't hear anyway, I'm old and tired and I don't know what they're talking about"        Exercises      General Comments General comments (skin integrity, edema, etc.): on RA throughout with SpO2 stable end of session, HR max 141      Pertinent Vitals/Pain Pain Assessment Pain Assessment: Faces Faces Pain Scale: Hurts little more Pain Location: generalized with mobility Pain Descriptors / Indicators: Discomfort, Sore Pain Intervention(s): Monitored during session, Repositioned, Limited activity within patient's tolerance    Home Living                          Prior Function            PT Goals (current goals can now be found in the care plan section) Progress towards PT goals: Not progressing toward goals - comment    Frequency    Min 3X/week      PT Plan Discharge plan needs to be updated;Frequency needs to be updated    Co-evaluation              AM-PAC PT "6 Clicks" Mobility   Outcome Measure  Help needed turning from your back to your side while in a flat bed without using bedrails?: A Little Help needed moving from lying on your back to sitting on the side of a flat bed without using bedrails?: A Lot Help needed moving to and from a bed to a chair (including a wheelchair)?: A Little Help needed standing up from a chair using your arms (e.g., wheelchair or bedside chair)?: A Little Help needed to walk in hospital room?: A Little Help needed climbing 3-5 steps with a railing? : Total 6 Click Score: 15    End of Session Equipment Utilized During Treatment: Gait belt Activity Tolerance: Patient limited by fatigue Patient left: in chair;with call bell/phone within  reach;with chair alarm set   PT Visit Diagnosis: Other abnormalities of gait and mobility (R26.89);History of falling (Z91.81)     Time: 1610-9604 PT Time Calculation (min) (ACUTE ONLY): 25 min  Charges:  $Gait Training: 8-22 mins $Therapeutic Activity: 8-22 mins                     Sheran Lawless, PT Acute Rehabilitation Services Office:[phone] 10/16/2022    Diana [name] 10/16/2022, 1:19 PM

## 2022-10-16 NOTE — Progress Notes (Addendum)
PROGRESS NOTE    Diana [name]  NWG:[id number] DOB: 04-07-1924 DOA: 10/13/2022 PCP: Excell Seltzer, MD    Brief Narrative:   Ms Diana [name] is a 89 year old female with past medical history of obstructive sleep apnea, hypertension, hypothyroidism, COPD who presented after mechanical fall.  Workup in the emergency department revealed rib fractures as well as scalp hematoma.  Patient was admitted to the trauma service for further workup.  Medicine team was consulted for medication management.  Assessment and plan.  Fall with Left scalp hematoma, Left-sided rib fractures -Management per trauma team.  Pain management.  Physical therapy evaluation recommend home health. Continue incentive spirometry.  Rectus sheath hematoma.  Being managed conservatively.  Monitor CBC.Hemoglobin today at 9.7 from 9.6.  CAD- continue aspirin, Lipitor, metoprolol. No active chest pain.   Concern for cranial aneurysm-incidental finding noted on  initial CT head scan.  Radiology s recommended  CTA head and neck to rule out ACOM aneurysm.  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  Stenosis of the left vertebral artery.  Will need to follow-up with neurosurgery.   Hyperlipidemia-continue Lipitor   History of chronic systolic heart failure-continue Lasix, metoprolol.  Intake and output charting, daily weights.  Review of previous TTE in May showed EF 25%. Marland Kitchen   Hypothyroidism-continue Synthroid   Insomnia-continue trazodone    Hyponatremia - continue lasix, continue to monitor.  Sodium level at 129 today from 132.  Will continue to monitor closely.  Check BMP in AM.   Hypokalemia-replenished yesterday.  Improved to 3.9 today.  Chronic constipation.  Continue bowel regimen.  Has had bowel movements.  History of atrial fibrillation.  Anticoagulation hold.  Continue SCD.  Hemoglobin today at 9.7        DVT prophylaxis: enoxaparin (LOVENOX) injection 30 mg Start: 10/16/22 1045 SCDs Start: 10/13/22 2116   Code Status:     Code Status: DNR  Disposition: As per primary team.  Likely home health on discharge.  Status is: Inpatient   Family Communication: Spoke with the patient's son at bedside on 6/28.  Procedures:  None  Antimicrobials:  None  Anti-infectives (From admission, onward)    None       Subjective: Today, patient was seen and examined at bedside.   Patient complains of nausea today.  Denies any shortness of breath fever chills or rigor.   Objective: Vitals:   10/16/22 0716 10/16/22 0800 10/16/22 0900 10/16/22 1000  BP: 121/87 99/67 113/89 128/77  Pulse: (!) 113 (!) 113 (!) 111 (!) 117  Resp: 15 20 15 17   Temp:  97.7 F (36.5 C)    TempSrc:  Oral    SpO2: 95% 94% 97% 91%  Weight:      Height:        Intake/Output Summary (Last 24 hours) at 10/16/2022 1028 Last data filed at 10/16/2022 0136 Gross per 24 hour  Intake 480 ml  Output 320 ml  Net 160 ml    Filed Weights   10/13/22 1632 10/14/22 1400  Weight: 74.4 kg 72.9 kg    Physical Examination: Body mass index is 27.59 kg/m.   General:  Average built, not in obvious distress, elderly female, appears weak and deconditioned, HENT:   Moist mucosa.  Left periorbital ecchymosis.  Left forehead laceration status postrepair. Chest.  Diminished breath sounds bilaterally.  Tenderness over the left chest wall. CVS: S1 &S2 heard. No murmur.  Regular rate and rhythm. Abdomen: Soft, nontender, nondistended.  Bowel sounds are heard.  Left abdomen palpable fullness. Extremities: No cyanosis, clubbing or edema.  Peripheral pulses are palpable. Psych: Alert, awake and Communicative, CNS:  No cranial nerve deficits.  Moves extremities. Skin: Warm and dry.  Bruises noted over the skin.  Data Reviewed:   CBC: Recent Labs  Lab 10/13/22 1611 10/13/22 1614 10/14/22 0348 10/14/22 1333 10/15/22 0435  10/16/22 0316  WBC 6.1  --  7.1 6.5 5.9 6.3  HGB 9.7* 10.5* 8.9* 9.7* 9.6* 9.7*  HCT 31.4* 31.0* 27.8* 30.5* 30.6* 30.5*  MCV 107.2*  --  102.2* 105.2* 104.4* 105.9*  PLT 183  --  175 177 163 167     Basic Metabolic Panel: Recent Labs  Lab 10/13/22 1611 10/13/22 1614 10/14/22 0348 10/15/22 0435 10/16/22 0316  NA 132* 134* 132* 132* 129*  K 3.1* 3.1* 3.3* 3.7 3.9  CL 97* 95* 97* 98 96*  CO2 24  --  26 24 22   GLUCOSE 110* 108* 84 85 107*  BUN 24* 25* 22 18 19   CREATININE 1.07* 1.10* 0.99 1.04* 1.09*  CALCIUM 8.5*  --  8.4* 8.6* 8.6*  MG  --   --   --  2.2 2.2     Liver Function Tests: Recent Labs  Lab 10/13/22 1611  AST 21  ALT 15  ALKPHOS 86  BILITOT 1.1  PROT 6.3*  ALBUMIN 3.2*      Radiology Studies: CT ANGIO HEAD NECK W WO CM  Result Date: 10/15/2022 CLINICAL DATA:  Cerebral aneurysm screening, high-risk concern for aneurysm. EXAM: CT ANGIOGRAPHY HEAD AND NECK WITH AND WITHOUT CONTRAST TECHNIQUE: Multidetector CT imaging of the head and neck was performed using the standard protocol during bolus administration of intravenous contrast. Multiplanar CT image reconstructions and MIPs were obtained to evaluate the vascular anatomy. Carotid stenosis measurements (when applicable) are obtained utilizing NASCET criteria, using the distal internal carotid diameter as the denominator. RADIATION DOSE REDUCTION: This exam was performed according to the departmental dose-optimization program which includes automated exposure control, adjustment of the mA and/or kV according to patient size and/or use of iterative reconstruction technique. CONTRAST:  75mL OMNIPAQUE IOHEXOL 350 MG/ML SOLN COMPARISON:  Head CT 10/13/2022.  CT chest 10/13/2022. FINDINGS: CT HEAD FINDINGS Brain: No acute hemorrhage. Unchanged mild chronic small-vessel disease and temporal predominant volume loss. Cortical gray-white differentiation is otherwise preserved. Prominence of the ventricles and sulci within  expected range for age. No hydrocephalus or extra-axial collection. 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 Vascular: No hyperdense vessel or unexpected calcification. Skull: No calvarial fracture or suspicious bone lesion. Skull base is unremarkable. Sinuses/Orbits: Unremarkable. Other: None. Review of the MIP images confirms the above findings CTA NECK FINDINGS Aortic arch: Three-vessel arch configuration. Atherosclerotic calcifications of the aortic arch and arch vessel origins. Arch vessel origins are patent. Right carotid system: No evidence of dissection, stenosis (50% or greater), or occlusion. Moderate calcified plaque along the proximal right cervical ICA. Left carotid system: No evidence of dissection, stenosis (50% or greater), or occlusion. Moderate calcified plaque along the left carotid bulb and proximal left cervical ICA. Vertebral arteries: Dominant right vertebral artery. Diminutive left vertebral artery with severe stenosis of the origin. Nonvisualization of the distal V2 and proximal V3 segments with reconstitution of the distal V3 and V4 segments, likely secondary to diminutive size  slow flow due to proximal stenosis. Skeleton: Prior C3 laminectomy.  No suspicious bone lesions. Other neck: Unremarkable. Upper chest: Partially visualized small right pleural effusion, better evaluated on recent chest CT. Review of the MIP images confirms the above findings CTA HEAD FINDINGS Anterior circulation: Intracranial ICAs are patent without stenosis or aneurysm. The proximal ACAs and MCAs are patent without stenosis or aneurysm. Distal branches are symmetric. Posterior circulation: Normal basilar artery. The SCAs, AICAs and PICAs are patent proximally. The PCAs are patent proximally without stenosis or aneurysm. Distal branches are symmetric. Venous  sinuses: As permitted by contrast timing, patent. Anatomic variants: None. Review of the MIP images confirms the above findings IMPRESSION: 1. No acute intracranial abnormality. Unchanged mild chronic small-vessel disease and temporal predominant volume loss. 2. No large vessel occlusion, hemodynamically significant stenosis or aneurysm of the intracranial vessels. 3. 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 4. Severe stenosis of the diminutive left vertebral artery. Nonvisualization of the distal V2 and proximal V3 segments with reconstitution of the distal V3 and V4 segments, likely secondary to diminutive size and slow flow due to proximal stenosis. Aortic Atherosclerosis (ICD10-I70.0). Electronically Signed   By: Orvan Falconer M.D.   On: 10/15/2022 13:42      LOS: 3 days   Joycelyn Das, MD [facility] Available via Epic secure chat 7am-7pm After these hours, please refer to coverage provider listed on [email or web address] 10/16/2022, 10:28 AM

## 2022-10-16 NOTE — Progress Notes (Signed)
   10/16/22 2018  Assess: MEWS Score  Temp (!) 97.5 F (36.4 C)  BP 97/64  MAP (mmHg) 76  Pulse Rate (!) 113  ECG Heart Rate (!) 113  Resp 20  SpO2 96 %  O2 Device Room Air  O2 Flow Rate (L/min) 1 L/min  Assess: MEWS Score  MEWS Temp 0  MEWS Systolic 1  MEWS Pulse 2  MEWS RR 0  MEWS LOC 0  MEWS Score 3  MEWS Score Color Yellow  Assess: if the MEWS score is Yellow or Red  Were vital signs taken at a resting state? No  Focused Assessment No change from prior assessment  Does the patient meet 2 or more of the SIRS criteria? No  MEWS guidelines implemented  Yes, yellow  Treat  MEWS Interventions Considered administering scheduled or prn medications/treatments as ordered  Take Vital Signs  Increase Vital Sign Frequency  Yellow: Q2hr x1, continue Q4hrs until patient remains green for 12hrs  Escalate  MEWS: Escalate Yellow: Discuss with charge nurse and consider notifying provider and/or RRT  Notify: Charge Nurse/RN  Name of Charge Nurse/RN Notified Gladys, RN  Assess: SIRS CRITERIA  SIRS Temperature  0  SIRS Pulse 1  SIRS Respirations  0  SIRS WBC 0  SIRS Score Sum  1

## 2022-10-16 NOTE — Plan of Care (Signed)

## 2022-10-16 NOTE — Progress Notes (Signed)
[facility] Progress Note     Subjective: CC:  NAEO. Did well with PT yesterday. Had some sanguinous oozing from L ear that is now hemostatic. Hgb stable, 9.6. +BM x2 s/p enema  Objective: Vital signs in last 24 hours: Temp:  [96.2 F (35.7 C)-97.7 F (36.5 C)] 97.7 F (36.5 C) (06/29 0800) Pulse Rate:  [92-128] 113 (06/29 0800) Resp:  [14-24] 20 (06/29 0800) BP: (90-138)/(47-98) 99/67 (06/29 0800) SpO2:  [94 %-100 %] 94 % (06/29 0800) Last BM Date : 10/14/22  Intake/Output from previous day: 06/28 0701 - 06/29 0700 In: 600 [P.O.:600] Out: 320 [Urine:320] Intake/Output this shift: No intake/output data recorded.  PE: Gen:  Alert, elderly female, NAD HEENT: L peri-orbital ecchymosis  Card:  Regular rate and rhythm Pulm:  Normal effort on [place] Abd: Soft, non-tender, non-distended, left hemi-abdomen with no visible hematoma but palpable firmness/fullness in left hemi-abdomen  Skin: warm and dry, no rashes  Psych: A&Ox3   Lab Results:  Recent Labs    10/15/22 0435 10/16/22 0316  WBC 5.9 6.3  HGB 9.6* 9.7*  HCT 30.6* 30.5*  PLT 163 167   BMET Recent Labs    10/15/22 0435 10/16/22 0316  NA 132* 129*  K 3.7 3.9  CL 98 96*  CO2 24 22  GLUCOSE 85 107*  BUN 18 19  CREATININE 1.04* 1.09*  CALCIUM 8.6* 8.6*   PT/INR Recent Labs    10/13/22 1611  LABPROT 21.6*  INR 1.9*   CMP     Component Value Date/Time   NA 129 (L) 10/16/2022 0316   NA 136 (A) 07/02/2015 0000   K 3.9 10/16/2022 0316   CL 96 (L) 10/16/2022 0316   CO2 22 10/16/2022 0316   GLUCOSE 107 (H) 10/16/2022 0316   BUN 19 10/16/2022 0316   BUN 16 07/02/2015 0000   CREATININE 1.09 (H) 10/16/2022 0316   CREATININE 1.27 (H) 08/27/2022 1609   CALCIUM 8.6 (L) 10/16/2022 0316   PROT 6.3 (L) 10/13/2022 1611   ALBUMIN 3.2 (L) 10/13/2022 1611   AST 21 10/13/2022 1611   ALT 15 10/13/2022 1611   ALKPHOS 86 10/13/2022 1611   BILITOT 1.1 10/13/2022 1611   GFRNONAA 46 (L) 10/16/2022  0316   GFRAA >60 01/10/2017 1248   Lipase  No results found for: "LIPASE"     Studies/Results: CT ANGIO HEAD NECK W WO CM  Result Date: 10/15/2022 CLINICAL DATA:  Cerebral aneurysm screening, high-risk concern for aneurysm. EXAM: CT ANGIOGRAPHY HEAD AND NECK WITH AND WITHOUT CONTRAST TECHNIQUE: Multidetector CT imaging of the head and neck was performed using the standard protocol during bolus administration of intravenous contrast. Multiplanar CT image reconstructions and MIPs were obtained to evaluate the vascular anatomy. Carotid stenosis measurements (when applicable) are obtained utilizing NASCET criteria, using the distal internal carotid diameter as the denominator. RADIATION DOSE REDUCTION: This exam was performed according to the departmental dose-optimization program which includes automated exposure control, adjustment of the mA and/or kV according to patient size and/or use of iterative reconstruction technique. CONTRAST:  75mL OMNIPAQUE IOHEXOL 350 MG/ML SOLN COMPARISON:  Head CT 10/13/2022.  CT chest 10/13/2022. FINDINGS: CT HEAD FINDINGS Brain: No acute hemorrhage. Unchanged mild chronic small-vessel disease and temporal predominant volume loss. Cortical gray-Corby Villasenor differentiation is otherwise preserved. Prominence of the ventricles and sulci within expected range for age. No hydrocephalus or extra-axial collection. Enhancing extra-axial suprasellar mass with intrasellar component, measuring up to 29 x 23 mm (sagittal image 36 series 7) and likely  a pituitary macroadenoma or planum sphenoidale meningioma. Vascular: No hyperdense vessel or unexpected calcification. Skull: No calvarial fracture or suspicious bone lesion. Skull base is unremarkable. Sinuses/Orbits: Unremarkable. Other: None. Review of the MIP images confirms the above findings CTA NECK FINDINGS Aortic arch: Three-vessel arch configuration. Atherosclerotic calcifications of the aortic arch and arch vessel origins. Arch vessel  origins are patent. Right carotid system: No evidence of dissection, stenosis (50% or greater), or occlusion. Moderate calcified plaque along the proximal right cervical ICA. Left carotid system: No evidence of dissection, stenosis (50% or greater), or occlusion. Moderate calcified plaque along the left carotid bulb and proximal left cervical ICA. Vertebral arteries: Dominant right vertebral artery. Diminutive left vertebral artery with severe stenosis of the origin. Nonvisualization of the distal V2 and proximal V3 segments with reconstitution of the distal V3 and V4 segments, likely secondary to diminutive size slow flow due to proximal stenosis. Skeleton: Prior C3 laminectomy.  No suspicious bone lesions. Other neck: Unremarkable. Upper chest: Partially visualized small right pleural effusion, better evaluated on recent chest CT. Review of the MIP images confirms the above findings CTA HEAD FINDINGS Anterior circulation: Intracranial ICAs are patent without stenosis or aneurysm. The proximal ACAs and MCAs are patent without stenosis or aneurysm. Distal branches are symmetric. Posterior circulation: Normal basilar artery. The SCAs, AICAs and PICAs are patent proximally. The PCAs are patent proximally without stenosis or aneurysm. Distal branches are symmetric. Venous sinuses: As permitted by contrast timing, patent. Anatomic variants: None. Review of the MIP images confirms the above findings IMPRESSION: 1. No acute intracranial abnormality. Unchanged mild chronic small-vessel disease and temporal predominant volume loss. 2. No large vessel occlusion, hemodynamically significant stenosis or aneurysm of the intracranial vessels. 3. 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 4. Severe stenosis of the diminutive left vertebral artery. Nonvisualization of the distal V2 and proximal V3 segments with reconstitution of the distal V3 and V4  segments, likely secondary to diminutive size and slow flow due to proximal stenosis. Aortic Atherosclerosis (ICD10-I70.0). Electronically Signed   By: Orvan Falconer M.D.   On: 10/15/2022 13:42    Anti-infectives: Anti-infectives (From admission, onward)    None        Assessment/Plan  89yoF s/p fall Left 2-5 rib fractures - IS 10x/hr while awake; multidmodal pain control, wean O2 as able Rectus sheath hematoma - monitor abdominal exam, no extrav on CT; hgb stable 8.9 > 9.7 >9.6 Left ear lac - repaired by EDP with suture Left forehead lac - repaired by EDP with dermabond Chronic constipation - based on CT, large rectal stool burden. Bowel regimen, Miralax BID and senna BID    Afib - hold anticoagulation   PPX: SCDs; holding chemical dvt ppx in light of onboard anticoagulation, rectus sheath hematoma; prophylacitc lovenox today FEN: soft diet, SLIV ID: none Dispo - 4NP, PT/OT over the weekend, anticipate discharge to ALF Monday.   LOS: 3 days   I reviewed nursing notes, hospitalist notes, last 24 h vitals and pain scores, last 48 h intake and output, last 24 h labs and trends, and last 24 h imaging results.  This care required moderate level of medical decision making.   Marin Olp, MD [facility]

## 2022-10-17 DIAGNOSIS — I10 Essential (primary) hypertension: Secondary | ICD-10-CM | POA: Diagnosis not present

## 2022-10-17 DIAGNOSIS — S2242XA Multiple fractures of ribs, left side, initial encounter for closed fracture: Secondary | ICD-10-CM | POA: Diagnosis not present

## 2022-10-17 DIAGNOSIS — J449 Chronic obstructive pulmonary disease, unspecified: Secondary | ICD-10-CM | POA: Diagnosis not present

## 2022-10-17 DIAGNOSIS — E876 Hypokalemia: Secondary | ICD-10-CM | POA: Diagnosis not present

## 2022-10-17 LAB — CBC
HCT: 31 % — ABNORMAL LOW (ref 36.0–46.0)
Hemoglobin: 10.1 g/dL — ABNORMAL LOW (ref 12.0–15.0)
MCH: 33.2 pg (ref 26.0–34.0)
MCHC: 32.6 g/dL (ref 30.0–36.0)
MCV: 102 fL — ABNORMAL HIGH (ref 80.0–100.0)
Platelets: 163 10*3/uL (ref 150–400)
RBC: 3.04 MIL/uL — ABNORMAL LOW (ref 3.87–5.11)
RDW: 18.8 % — ABNORMAL HIGH (ref 11.5–15.5)
WBC: 9.4 10*3/uL (ref 4.0–10.5)
nRBC: 0 % (ref 0.0–0.2)

## 2022-10-17 LAB — BASIC METABOLIC PANEL
Anion gap: 12 (ref 5–15)
BUN: 20 mg/dL (ref 8–23)
CO2: 21 mmol/L — ABNORMAL LOW (ref 22–32)
Calcium: 8.4 mg/dL — ABNORMAL LOW (ref 8.9–10.3)
Chloride: 95 mmol/L — ABNORMAL LOW (ref 98–111)
Creatinine, Ser: 1.17 mg/dL — ABNORMAL HIGH (ref 0.44–1.00)
GFR, Estimated: 43 mL/min — ABNORMAL LOW (ref >60)
Glucose, Bld: 93 mg/dL (ref 70–99)
Potassium: 5.3 mmol/L — ABNORMAL HIGH (ref 3.5–5.1)
Sodium: 128 mmol/L — ABNORMAL LOW (ref 135–145)

## 2022-10-17 NOTE — Progress Notes (Addendum)
[facility] Progress Note     Subjective: CC:  NAEO. Did well with PT. No complaints. Son at bedside, sister on speaker phone whom he reports is a retired Engineer, civil (consulting).  Hgb stable  +BM  Objective: Vital signs in last 24 hours: Temp:  [97.4 F (36.3 C)-98 F (36.7 C)] 97.5 F (36.4 C) (06/30 0753) Pulse Rate:  [97-113] 100 (06/30 0753) Resp:  [11-22] 11 (06/30 0753) BP: (96-114)/(42-79) 113/55 (06/30 0753) SpO2:  [92 %-98 %] 98 % (06/30 0753) Weight:  [72.1 kg-72.5 kg] 72.5 kg (06/30 0416) Last BM Date : 10/14/22  Intake/Output from previous day: 06/29 0701 - 06/30 0700 In: 240 [P.O.:240] Out: -  Intake/Output this shift: No intake/output data recorded.  PE: Gen:  Alert, elderly female, NAD HEENT: L peri-orbital ecchymosis  Card:  Regular rate and rhythm Pulm:  Normal effort on [place] Abd: Soft, non-tender, non-distended, left hemi-abdomen with no visible hematoma but palpable firmness/fullness in left hemi-abdomen  Skin: warm and dry, no rashes  Psych: A&Ox3   Lab Results:  Recent Labs    10/16/22 0316 10/17/22 0305  WBC 6.3 9.4  HGB 9.7* 10.1*  HCT 30.5* 31.0*  PLT 167 163   BMET Recent Labs    10/16/22 0316 10/17/22 0305  NA 129* 128*  K 3.9 5.3*  CL 96* 95*  CO2 22 21*  GLUCOSE 107* 93  BUN 19 20  CREATININE 1.09* 1.17*  CALCIUM 8.6* 8.4*   PT/INR No results for input(s): "LABPROT", "INR" in the last 72 hours.  CMP     Component Value Date/Time   NA 128 (L) 10/17/2022 0305   NA 136 (A) 07/02/2015 0000   K 5.3 (H) 10/17/2022 0305   CL 95 (L) 10/17/2022 0305   CO2 21 (L) 10/17/2022 0305   GLUCOSE 93 10/17/2022 0305   BUN 20 10/17/2022 0305   BUN 16 07/02/2015 0000   CREATININE 1.17 (H) 10/17/2022 0305   CREATININE 1.27 (H) 08/27/2022 1609   CALCIUM 8.4 (L) 10/17/2022 0305   PROT 6.3 (L) 10/13/2022 1611   ALBUMIN 3.2 (L) 10/13/2022 1611   AST 21 10/13/2022 1611   ALT 15 10/13/2022 1611   ALKPHOS 86 10/13/2022 1611   BILITOT  1.1 10/13/2022 1611   GFRNONAA 43 (L) 10/17/2022 0305   GFRAA >60 01/10/2017 1248   Lipase  No results found for: "LIPASE"     Studies/Results: CT ANGIO HEAD NECK W WO CM  Result Date: 10/15/2022 CLINICAL DATA:  Cerebral aneurysm screening, high-risk concern for aneurysm. EXAM: CT ANGIOGRAPHY HEAD AND NECK WITH AND WITHOUT CONTRAST TECHNIQUE: Multidetector CT imaging of the head and neck was performed using the standard protocol during bolus administration of intravenous contrast. Multiplanar CT image reconstructions and MIPs were obtained to evaluate the vascular anatomy. Carotid stenosis measurements (when applicable) are obtained utilizing NASCET criteria, using the distal internal carotid diameter as the denominator. RADIATION DOSE REDUCTION: This exam was performed according to the departmental dose-optimization program which includes automated exposure control, adjustment of the mA and/or kV according to patient size and/or use of iterative reconstruction technique. CONTRAST:  75mL OMNIPAQUE IOHEXOL 350 MG/ML SOLN COMPARISON:  Head CT 10/13/2022.  CT chest 10/13/2022. FINDINGS: CT HEAD FINDINGS Brain: No acute hemorrhage. Unchanged mild chronic small-vessel disease and temporal predominant volume loss. Cortical gray-Carleta Woodrow differentiation is otherwise preserved. Prominence of the ventricles and sulci within expected range for age. No hydrocephalus or extra-axial collection. 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 Vascular: No hyperdense vessel or unexpected calcification. Skull: No calvarial fracture or suspicious bone lesion. Skull base is unremarkable. Sinuses/Orbits: Unremarkable. Other: None. Review of the MIP images confirms the above findings CTA NECK FINDINGS Aortic arch: Three-vessel arch configuration. Atherosclerotic calcifications of the  aortic arch and arch vessel origins. Arch vessel origins are patent. Right carotid system: No evidence of dissection, stenosis (50% or greater), or occlusion. Moderate calcified plaque along the proximal right cervical ICA. Left carotid system: No evidence of dissection, stenosis (50% or greater), or occlusion. Moderate calcified plaque along the left carotid bulb and proximal left cervical ICA. Vertebral arteries: Dominant right vertebral artery. Diminutive left vertebral artery with severe stenosis of the origin. Nonvisualization of the distal V2 and proximal V3 segments with reconstitution of the distal V3 and V4 segments, likely secondary to diminutive size slow flow due to proximal stenosis. Skeleton: Prior C3 laminectomy.  No suspicious bone lesions. Other neck: Unremarkable. Upper chest: Partially visualized small right pleural effusion, better evaluated on recent chest CT. Review of the MIP images confirms the above findings CTA HEAD FINDINGS Anterior circulation: Intracranial ICAs are patent without stenosis or aneurysm. The proximal ACAs and MCAs are patent without stenosis or aneurysm. Distal branches are symmetric. Posterior circulation: Normal basilar artery. The SCAs, AICAs and PICAs are patent proximally. The PCAs are patent proximally without stenosis or aneurysm. Distal branches are symmetric. Venous sinuses: As permitted by contrast timing, patent. Anatomic variants: None. Review of the MIP images confirms the above findings IMPRESSION: 1. No acute intracranial abnormality. Unchanged mild chronic small-vessel disease and temporal predominant volume loss. 2. No large vessel occlusion, hemodynamically significant stenosis or aneurysm of the intracranial vessels. 3. 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 4. Severe stenosis of the diminutive left vertebral artery. Nonvisualization of the distal V2 and proximal V3 segments  with reconstitution of the distal V3 and V4 segments, likely secondary to diminutive size and slow flow due to proximal stenosis. Aortic Atherosclerosis (ICD10-I70.0). Electronically Signed   By: Orvan Falconer M.D.   On: 10/15/2022 13:42    Anti-infectives: Anti-infectives (From admission, onward)    None        Assessment/Plan  89yoF s/p fall Left 2-5 rib fractures - IS 10x/hr while awake; multidmodal pain control, wean O2 as able Rectus sheath hematoma - monitor abdominal exam, no extrav on CT; hgb stable 8.9 > 9.7 >9.6 Left ear lac - repaired by EDP with suture Left forehead lac - repaired by EDP with dermabond Chronic constipation - based on CT, large rectal stool burden. Bowel regimen, Miralax BID and senna BID    Afib - hold anticoagulation   PPX: SCDs; prophylacitc lovenox started 6/29 FEN: soft diet, SLIV ID: none Dispo - 4NP, PT/OT over the weekend, anticipate discharge to ALF Monday?    LOS: 4 days   I reviewed nursing notes, hospitalist notes, last 24 h vitals and pain scores, last 48 h intake and output, last 24 h labs and trends, and last 24 h imaging results.  This care required moderate level of medical decision making.   Marin Olp, MD [facility]

## 2022-10-17 NOTE — Progress Notes (Signed)
Patient has increased swelling and new onset pain in her right arm, localized around the elbow. MD paged and made aware.

## 2022-10-17 NOTE — Progress Notes (Signed)
PROGRESS NOTE    DEVINA KECKLER  [name]:[id number] DOB: [birth year] DOA: 10/13/2022 PCP: Excell Seltzer, MD    Brief Narrative:   Diana [name] is a 89 year old female with past medical history of obstructive sleep apnea, hypertension, hypothyroidism, COPD who presented after mechanical fall.  Workup in the emergency department revealed rib fractures as well as scalp hematoma.  Patient was admitted to the trauma service for further workup.  Medicine team was consulted for medication management.  Assessment and plan.  Fall with Left scalp hematoma, Left-sided rib fractures -Management per trauma team.  Continue pain management.  Physical therapy evaluation recommend home health. Continue incentive spirometry.  Rectus sheath hematoma.  Being managed conservatively.  Monitor CBC.Hemoglobin today at 10.1 from 9.7.  CAD- continue aspirin, Lipitor, metoprolol. No active chest pain.   Concern for cranial aneurysm-incidental finding noted on  initial CT head scan.  Radiology s recommended  CTA head and neck to rule out ACOM aneurysm.  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  Stenosis of the left vertebral artery.  Will need to follow-up with neurosurgery.  Discussed this finding with the patient's son at bedside.   Hyperlipidemia-continue Lipitor   History of chronic systolic heart failure-continue Lasix, metoprolol.  Intake and output charting, daily weights.  Review of previous TTE in May showed EF 25%. .  Patient is negative balance for 3 3 5  mL.   Hypothyroidism-continue Synthroid   Insomnia-continue trazodone    Hyponatremia - continue lasix, continue to monitor.  Sodium level at 128 today from 129.  Will discontinue Lasix at this time.  Hypokalemia-potassium today on the higher side at 5.3.  Will continue to monitor.  Chronic constipation.  Continue bowel regimen.   History  of atrial fibrillation.  Anticoagulation hold.  Continue SCD.  Hemoglobin today at 10.1      DVT prophylaxis: enoxaparin (LOVENOX) injection 30 mg Start: 10/16/22 1045 SCDs Start: 10/13/22 2116   Code Status:     Code Status: DNR  Disposition:  As per primary team.  PT recommending rehabilitation.  Likely to assisted living facility/skilled nursing facility.  Status is: Inpatient   Family Communication: Spoke with the patient's son at bedside on 6/28.  Procedures:  None  Antimicrobials:  None  Anti-infectives (From admission, onward)    None       Subjective:  Today, patient was seen and examined at bedside.  Denies any shortness of breath, chest pain palpitation.  No nausea today.  Patient's son at bedside.    Objective: Vitals:   10/16/22 2352 10/17/22 0336 10/17/22 0416 10/17/22 0753  BP: (!) 96/57 104/79  (!) 113/55  Pulse: (!) 107 99  100  Resp: (!) 21 19  11   Temp: 98 F (36.7 C) 97.6 F (36.4 C)  (!) 97.5 F (36.4 C)  TempSrc: Oral Oral  Axillary  SpO2: 92% 96%  98%  Weight:   72.5 kg   Height:       No intake or output data in the 24 hours ending 10/17/22 1028  Filed Weights   10/14/22 1400 10/16/22 1400 10/17/22 0416  Weight: 72.9 kg 72.1 kg 72.5 kg    Physical Examination: Body mass index is 27.44 kg/m.   General:  Average built, not in obvious distress, elderly female, weak and deconditioned.   HENT:   Moist mucosa.  Left periorbital ecchymosis.  Left forehead laceration status postrepair. Chest.  Diminished breath sounds bilaterally.  Tenderness over the left chest wall on palpation.. CVS: S1 &S2 heard. No murmur.  Regular rate and rhythm. Abdomen: Soft, nontender, nondistended.  Bowel sounds are heard.  Left abdomen palpable fullness. Extremities: No cyanosis, clubbing or edema.  Left upper extremity bruises. Psych: Alert, awake and Communicative, CNS:  No cranial nerve deficits.  Moves extremities. Skin: Warm and dry.  Bruises noted  over the skin.  Data Reviewed:   CBC: Recent Labs  Lab 10/14/22 0348 10/14/22 1333 10/15/22 0435 10/16/22 0316 10/17/22 0305  WBC 7.1 6.5 5.9 6.3 9.4  HGB 8.9* 9.7* 9.6* 9.7* 10.1*  HCT 27.8* 30.5* 30.6* 30.5* 31.0*  MCV 102.2* 105.2* 104.4* 105.9* 102.0*  PLT 175 177 163 167 163     Basic Metabolic Panel: Recent Labs  Lab 10/13/22 1611 10/13/22 1614 10/14/22 0348 10/15/22 0435 10/16/22 0316 10/17/22 0305  NA 132* 134* 132* 132* 129* 128*  K 3.1* 3.1* 3.3* 3.7 3.9 5.3*  CL 97* 95* 97* 98 96* 95*  CO2 24  --  26 24 22  21*  GLUCOSE 110* 108* 84 85 107* 93  BUN 24* 25* 22 18 19 20   CREATININE 1.07* 1.10* 0.99 1.04* 1.09* 1.17*  CALCIUM 8.5*  --  8.4* 8.6* 8.6* 8.4*  MG  --   --   --  2.2 2.2  --      Liver Function Tests: Recent Labs  Lab 10/13/22 1611  AST 21  ALT 15  ALKPHOS 86  BILITOT 1.1  PROT 6.3*  ALBUMIN 3.2*      Radiology Studies: CT ANGIO HEAD NECK W WO CM  Result Date: 10/15/2022 CLINICAL DATA:  Cerebral aneurysm screening, high-risk concern for aneurysm. EXAM: CT ANGIOGRAPHY HEAD AND NECK WITH AND WITHOUT CONTRAST TECHNIQUE: Multidetector CT imaging of the head and neck was performed using the standard protocol during bolus administration of intravenous contrast. Multiplanar CT image reconstructions and MIPs were obtained to evaluate the vascular anatomy. Carotid stenosis measurements (when applicable) are obtained utilizing NASCET criteria, using the distal internal carotid diameter as the denominator. RADIATION DOSE REDUCTION: This exam was performed according to the departmental dose-optimization program which includes automated exposure control, adjustment of the mA and/or kV according to patient size and/or use of iterative reconstruction technique. CONTRAST:  75mL OMNIPAQUE IOHEXOL 350 MG/ML SOLN COMPARISON:  Head CT 10/13/2022.  CT chest 10/13/2022. FINDINGS: CT HEAD FINDINGS Brain: No acute hemorrhage. Unchanged mild chronic small-vessel  disease and temporal predominant volume loss. Cortical gray-white differentiation is otherwise preserved. Prominence of the ventricles and sulci within expected range for age. No hydrocephalus or extra-axial collection. 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 Vascular: No hyperdense vessel or unexpected calcification. Skull: No calvarial fracture or suspicious bone lesion. Skull base is unremarkable. Sinuses/Orbits: Unremarkable. Other: None. Review of the MIP images confirms the above findings CTA NECK FINDINGS Aortic arch: Three-vessel arch configuration. Atherosclerotic calcifications of the aortic arch and arch vessel origins. Arch vessel origins are patent. Right carotid system: No evidence of dissection, stenosis (50% or greater), or occlusion. Moderate calcified plaque along the proximal right cervical ICA. Left carotid system: No evidence of dissection, stenosis (50% or greater), or occlusion. Moderate calcified plaque along the left carotid bulb and proximal left cervical ICA. Vertebral arteries: Dominant right vertebral artery. Diminutive left vertebral artery with severe stenosis of the origin. Nonvisualization of the  distal V2 and proximal V3 segments with reconstitution of the distal V3 and V4 segments, likely secondary to diminutive size slow flow due to proximal stenosis. Skeleton: Prior C3 laminectomy.  No suspicious bone lesions. Other neck: Unremarkable. Upper chest: Partially visualized small right pleural effusion, better evaluated on recent chest CT. Review of the MIP images confirms the above findings CTA HEAD FINDINGS Anterior circulation: Intracranial ICAs are patent without stenosis or aneurysm. The proximal ACAs and MCAs are patent without stenosis or aneurysm. Distal branches are symmetric. Posterior circulation: Normal basilar artery. The  SCAs, AICAs and PICAs are patent proximally. The PCAs are patent proximally without stenosis or aneurysm. Distal branches are symmetric. Venous sinuses: As permitted by contrast timing, patent. Anatomic variants: None. Review of the MIP images confirms the above findings IMPRESSION: 1. No acute intracranial abnormality. Unchanged mild chronic small-vessel disease and temporal predominant volume loss. 2. No large vessel occlusion, hemodynamically significant stenosis or aneurysm of the intracranial vessels. 3. 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 4. Severe stenosis of the diminutive left vertebral artery. Nonvisualization of the distal V2 and proximal V3 segments with reconstitution of the distal V3 and V4 segments, likely secondary to diminutive size and slow flow due to proximal stenosis. Aortic Atherosclerosis (ICD10-I70.0). Electronically Signed   By: Orvan Falconer M.D.   On: 10/15/2022 13:42      LOS: 4 days   Joycelyn Das, MD [facility] Available via Epic secure chat 7am-7pm After these hours, please refer to coverage provider listed on [email or web address] 10/17/2022, 10:28 AM

## 2022-10-18 ENCOUNTER — Inpatient Hospital Stay (HOSPITAL_COMMUNITY): Payer: Medicare Other

## 2022-10-18 DIAGNOSIS — S2242XA Multiple fractures of ribs, left side, initial encounter for closed fracture: Secondary | ICD-10-CM | POA: Diagnosis not present

## 2022-10-18 DIAGNOSIS — M7989 Other specified soft tissue disorders: Secondary | ICD-10-CM | POA: Diagnosis not present

## 2022-10-18 DIAGNOSIS — J449 Chronic obstructive pulmonary disease, unspecified: Secondary | ICD-10-CM | POA: Diagnosis not present

## 2022-10-18 DIAGNOSIS — E876 Hypokalemia: Secondary | ICD-10-CM | POA: Diagnosis not present

## 2022-10-18 DIAGNOSIS — I10 Essential (primary) hypertension: Secondary | ICD-10-CM | POA: Diagnosis not present

## 2022-10-18 LAB — BASIC METABOLIC PANEL
Anion gap: 10 (ref 5–15)
BUN: 19 mg/dL (ref 8–23)
CO2: 25 mmol/L (ref 22–32)
Calcium: 8.4 mg/dL — ABNORMAL LOW (ref 8.9–10.3)
Chloride: 93 mmol/L — ABNORMAL LOW (ref 98–111)
Creatinine, Ser: 1.19 mg/dL — ABNORMAL HIGH (ref 0.44–1.00)
GFR, Estimated: 42 mL/min — ABNORMAL LOW (ref >60)
Glucose, Bld: 90 mg/dL (ref 70–99)
Potassium: 4.2 mmol/L (ref 3.5–5.1)
Sodium: 128 mmol/L — ABNORMAL LOW (ref 135–145)

## 2022-10-18 LAB — CBC
HCT: 30 % — ABNORMAL LOW (ref 36.0–46.0)
Hemoglobin: 9.8 g/dL — ABNORMAL LOW (ref 12.0–15.0)
MCH: 34 pg (ref 26.0–34.0)
MCHC: 32.7 g/dL (ref 30.0–36.0)
MCV: 104.2 fL — ABNORMAL HIGH (ref 80.0–100.0)
Platelets: 144 10*3/uL — ABNORMAL LOW (ref 150–400)
RBC: 2.88 MIL/uL — ABNORMAL LOW (ref 3.87–5.11)
RDW: 18.7 % — ABNORMAL HIGH (ref 11.5–15.5)
WBC: 16.2 10*3/uL — ABNORMAL HIGH (ref 4.0–10.5)
nRBC: 0 % (ref 0.0–0.2)

## 2022-10-18 LAB — MAGNESIUM: Magnesium: 2.2 mg/dL (ref 1.7–2.4)

## 2022-10-18 MED ORDER — DOXYCYCLINE HYCLATE 100 MG PO TABS
100.0000 mg | ORAL_TABLET | Freq: Two times a day (BID) | ORAL | Status: DC
  Administered 2022-10-18 – 2022-10-19 (×3): 100 mg via ORAL
  Filled 2022-10-18 (×3): qty 1

## 2022-10-18 MED ORDER — SODIUM CHLORIDE 0.9 % IV BOLUS
500.0000 mL | Freq: Once | INTRAVENOUS | Status: AC
  Administered 2022-10-18: 500 mL via INTRAVENOUS

## 2022-10-18 MED ORDER — ENOXAPARIN SODIUM 30 MG/0.3ML IJ SOSY
30.0000 mg | PREFILLED_SYRINGE | INTRAMUSCULAR | Status: DC
  Administered 2022-10-18 – 2022-10-25 (×8): 30 mg via SUBCUTANEOUS
  Filled 2022-10-18 (×8): qty 0.3

## 2022-10-18 NOTE — TOC Progression Note (Signed)
Transition of Care [facility]) - Progression Note    Patient Details  Name: Diana [name] MRN: [id number] Date of Birth: Mar 16, 1924  [facility] CM/SW Contact  Glennon Mac, RN Phone Number: 10/18/2022, 4:04 PM  Clinical Narrative:    PT/OT now recommending skilled nursing level of care for patient.  I spoke with patient's son, Smitty Cords, and he is in agreement with this assessment, and agrees to patient information being faxed out for SNF search.  He prefers [facility] as first choice for patient.  FL-2 initiated and faxed out for bed search.  Will provide bed offers when available.  Son Adult nurse of assistance.    Expected Discharge Plan: Skilled Nursing Facility Barriers to Discharge: Continued Medical Work up  Expected Discharge Plan and Services   Discharge Planning Services: CM Consult Post Acute Care Choice: Skilled Nursing Facility                             HH Arranged: PT, OT, Speech Therapy HH Agency: [facility] Date [facility] Agency Contacted: 10/14/22 Time HH Agency Contacted: 1707 Representative spoke with at [facility] Agency: Angie   Social Determinants of Health (SDOH) Interventions SDOH Screenings   Food Insecurity: No Food Insecurity (09/03/2022)  Housing: Low Risk  (09/03/2022)  Transportation Needs: No Transportation Needs (09/03/2022)  Utilities: Not At Risk (09/03/2022)  Alcohol Screen: Low Risk  (06/10/2022)  Depression (PHQ2-9): Low Risk  (06/10/2022)  Financial Resource Strain: Low Risk  (06/10/2022)  Physical Activity: Inactive (06/10/2022)  Social Connections: Socially Isolated (06/10/2022)  Stress: No Stress Concern Present (06/10/2022)  Tobacco Use: Medium Risk (10/13/2022)    Readmission Risk Interventions     No data to display         Quintella Baton, RN, BSN  Trauma/Neuro ICU Case Manager [phone]

## 2022-10-18 NOTE — Progress Notes (Signed)
TRAUMA RN called regarding RUE edema. On assessment RUE is tender, and warm to touch.   See new orders.

## 2022-10-18 NOTE — Progress Notes (Signed)
PROGRESS NOTE    Diana [name]  ZOX:[id number] DOB: 07-Mar-1924 DOA: 10/13/2022 PCP: Excell Seltzer, MD    Brief Narrative:   Ms Sarnowski is a 89 year old female with past medical history of obstructive sleep apnea, hypertension, hypothyroidism, COPD presented to the hospital after mechanical fall.  Workup in the emergency department revealed rib fractures as well as scalp hematoma.  Patient was then admitted to the trauma service for further workup.  Medicine team was consulted for medication management.  Assessment and plan.  Fall with Left scalp hematoma, Left-sided rib fractures -Management per trauma team.  Continue pain management.  Physical therapy evaluation recommend home health. Continue incentive spirometry.  Right upper extremity swelling.  Likely secondary to trauma.  Will rule out DVT.  Duplex ultrasound has been ordered.  Rectus sheath hematoma.  Being managed conservatively.  Monitor CBC. Hemoglobin today at 9.8  CAD- continue aspirin, Lipitor, metoprolol. No active chest pain.   Concern for cranial aneurysm-incidental finding noted on  initial CT head scan.  Radiology  recommended  CTA head and neck to rule out ACOM aneurysm.  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  Stenosis of the left vertebral artery.    Discussed this finding with the patient's son at bedside.  Will need follow-up with neurosurgery as outpatient.  Spoke with Dr. Johnsie Cancel in neurosurgery who recommended outpatient follow-up.  Patient unlikely to be a surgical candidate due to advanced age with frailty and family wishes.   Hyperlipidemia-continue Lipitor   History of chronic systolic heart failure-continue Lasix, metoprolol.  Intake and output charting, daily weights.  Review of previous TTE in May showed EF 25%. .  Patient is negative balance for .   Hypothyroidism-continue  Synthroid   Insomnia-continue trazodone   Hyponatremia - continue to monitor.  Sodium level at 128 today from 128.  Off Lasix at this time.  Hypokalemia-has normalized at this time.  Chronic constipation.  Continue bowel regimen.   History of atrial fibrillation.  Anticoagulation hold.  Continue SCD.  Hemoglobin today at 9.8      DVT prophylaxis: enoxaparin (LOVENOX) injection 30 mg Start: 10/18/22 2200 SCDs Start: 10/13/22 2116   Code Status:     Code Status: DNR  Disposition:  As per primary team.  PT recommending rehabilitation.  Likely to assisted living facility/skilled nursing facility.  Status is: Inpatient   Family Communication:  Spoke with the patient's son at bedside on 6/28.  Procedures:  None  Antimicrobials:  None  Anti-infectives (From admission, onward)    None       Subjective:  Today, patient was seen and examined at bedside.  Denies any shortness of breath, chest pain but complains of right upper extremity edema swelling and pain.   Objective: Vitals:   10/18/22 0600 10/18/22 0754 10/18/22 0800 10/18/22 1000  BP: 93/76 (!) 102/54 104/64 (!) 86/67  Pulse: (!) 105 (!) 126 (!) 120 100  Resp: (!) 25 19 19 13   Temp:  98.6 F (37 C)    TempSrc:  Oral    SpO2: (!) 87% 91% (!) 89% 96%  Weight:      Height:        Intake/Output Summary (Last 24 hours) at 10/18/2022 1045 Last data filed at 10/18/2022 0846 Gross per 24 hour  Intake 120 ml  Output 500 ml  Net -380 ml    Filed Weights   10/16/22 1400  10/17/22 0416 10/18/22 0500  Weight: 72.1 kg 72.5 kg 74.4 kg    Physical Examination: Body mass index is 28.15 kg/m.   General:  Average built, not in obvious distress, elderly female, weak and deconditioned.   HENT:   Moist mucosa.  Left periorbital ecchymosis more than the right.  Left forehead laceration status postrepair. Chest.  Diminished breath sounds bilaterally.  Tenderness over the left chest wall on palpation. CVS: S1 &S2 heard.  No murmur.  Regular rate and rhythm. Abdomen: Soft, nontender, nondistended.  Bowel sounds are heard.  Left abdomen palpable fullness. Extremities: No cyanosis, clubbing or edema.  Left upper extremity bruises.  Right upper extremity edema and swelling. Psych: Alert, awake and Communicative, CNS:  No cranial nerve deficits.  Moves extremities. Skin: Warm and dry.  Bruises noted over the skin.  Data Reviewed:   CBC: Recent Labs  Lab 10/14/22 1333 10/15/22 0435 10/16/22 0316 10/17/22 0305 10/18/22 0356  WBC 6.5 5.9 6.3 9.4 16.2*  HGB 9.7* 9.6* 9.7* 10.1* 9.8*  HCT 30.5* 30.6* 30.5* 31.0* 30.0*  MCV 105.2* 104.4* 105.9* 102.0* 104.2*  PLT 177 163 167 163 144*     Basic Metabolic Panel: Recent Labs  Lab 10/14/22 0348 10/15/22 0435 10/16/22 0316 10/17/22 0305 10/18/22 0356  NA 132* 132* 129* 128* 128*  K 3.3* 3.7 3.9 5.3* 4.2  CL 97* 98 96* 95* 93*  CO2 26 24 22  21* 25  GLUCOSE 84 85 107* 93 90  BUN 22 18 19 20 19   CREATININE 0.99 1.04* 1.09* 1.17* 1.19*  CALCIUM 8.4* 8.6* 8.6* 8.4* 8.4*  MG  --  2.2 2.2  --  2.2     Liver Function Tests: Recent Labs  Lab 10/13/22 1611  AST 21  ALT 15  ALKPHOS 86  BILITOT 1.1  PROT 6.3*  ALBUMIN 3.2*      Radiology Studies: No results found.    LOS: 5 days   Joycelyn Das, MD [facility] Available via Epic secure chat 7am-7pm After these hours, please refer to coverage provider listed on [email or web address] 10/18/2022, 10:45 AM

## 2022-10-18 NOTE — Progress Notes (Signed)
[facility] Progress Note     Subjective: Patient with increased pain/redness/edema of RUE. Son reports that since previous mastectomy she occasionally gets cellulitis of RUE and is usually treated with abx. Discussed findings of CTA and Son is in agreement that they would not want any surgical intervention so neurosurgery consult deferred.   Objective: Vital signs in last 24 hours: Temp:  [97.6 F (36.4 C)-98.6 F (37 C)] 98.6 F (37 C) (07/01 0754) Pulse Rate:  [84-126] 100 (07/01 1000) Resp:  [13-25] 13 (07/01 1000) BP: (86-109)/(54-77) 86/67 (07/01 1000) SpO2:  [87 %-100 %] 96 % (07/01 1000) Weight:  [74.4 kg] 74.4 kg (07/01 0500) Last BM Date : 10/14/22  Intake/Output from previous day: 06/30 0701 - 07/01 0700 In: -  Out: 300 [Urine:300] Intake/Output this shift: Total I/O In: 120 [P.O.:120] Out: 200 [Urine:200]  PE: Gen:  Alert, elderly female, NAD HEENT: L peri-orbital ecchymosis  Card:  Regular rate and rhythm Pulm:  Normal effort on [place]: Soft, non-tender, non-distended, left hemi-abdomen with no visible hematoma but palpable firmness/fullness in left hemi-abdomen  Ext: RUE with significant edema and erythema  Skin: warm and dry, no rashes  Psych: A&Ox3   Lab Results:  Recent Labs    10/17/22 0305 10/18/22 0356  WBC 9.4 16.2*  HGB 10.1* 9.8*  HCT 31.0* 30.0*  PLT 163 144*    BMET Recent Labs    10/17/22 0305 10/18/22 0356  NA 128* 128*  K 5.3* 4.2  CL 95* 93*  CO2 21* 25  GLUCOSE 93 90  BUN 20 19  CREATININE 1.17* 1.19*  CALCIUM 8.4* 8.4*    PT/INR No results for input(s): "LABPROT", "INR" in the last 72 hours.  CMP     Component Value Date/Time   NA 128 (L) 10/18/2022 0356   NA 136 (A) 07/02/2015 0000   K 4.2 10/18/2022 0356   CL 93 (L) 10/18/2022 0356   CO2 25 10/18/2022 0356   GLUCOSE 90 10/18/2022 0356   BUN 19 10/18/2022 0356   BUN 16 07/02/2015 0000   CREATININE 1.19 (H) 10/18/2022 0356   CREATININE 1.27 (H)  08/27/2022 1609   CALCIUM 8.4 (L) 10/18/2022 0356   PROT 6.3 (L) 10/13/2022 1611   ALBUMIN 3.2 (L) 10/13/2022 1611   AST 21 10/13/2022 1611   ALT 15 10/13/2022 1611   ALKPHOS 86 10/13/2022 1611   BILITOT 1.1 10/13/2022 1611   GFRNONAA 42 (L) 10/18/2022 0356   GFRAA >60 01/10/2017 1248   Lipase  No results found for: "LIPASE"     Studies/Results: No results found.  Anti-infectives: Anti-infectives (From admission, onward)    Start     Dose/Rate Route Frequency Ordered Stop   10/18/22 1215  doxycycline (VIBRA-TABS) tablet 100 mg        100 mg Oral Every 12 hours 10/18/22 1123 10/25/22 0959        Assessment/Plan  89yoF s/p fall Left 2-5 rib fractures - IS 10x/hr while awake; multidmodal pain control, wean O2 as able Rectus sheath hematoma - monitor abdominal exam, no extrav on CT; hgb stable at 9.8 Left ear lac - repaired by EDP with suture Left forehead lac - repaired by EDP with dermabond Chronic constipation - based on CT, large rectal stool burden. Bowel regimen, Miralax BID and senna BID  RUE cellulitis - WBC 16, will check [place] to r/o DVT, wound cx from May this year with pseudomonas so will start PO doxy x7 days    Afib -  hold anticoagulation 29 mm suprasellar mass with intrasellar component - ?pituitary macroadenoma vs planum sphenoidale meningioma, surgical intervention not appropriate, NS consult deferred    PPX: SCDs; prophylacitc lovenox started 6/29 FEN: soft diet, SLIV ID: PO doxy as above  Dispo - 4NP, PT/OT re-eval and dispo pending this. Pt medically stable for discharge when dispo determined    LOS: 5 days   I reviewed nursing notes, hospitalist notes, last 24 h vitals and pain scores, last 48 h intake and output, last 24 h labs and trends, and last 24 h imaging results.  Juliet Rude, [facility] 10/18/2022, 12:01 PM Please see Amion for pager number during day hours 7:00am-4:30pm

## 2022-10-18 NOTE — Progress Notes (Signed)
Physical Therapy Treatment Patient Details Name: Diana [name] MRN: [id number] DOB: 16-Nov-1923 Today's Date: 10/18/2022   History of Present Illness Pt is a 89 year old admitted 10/13/22 after mechanical fall. Dx with rib fractures L 2-5, scalp laceration and hematoma and small R PE. PMH includes chronic hearing loss, progressive bilateral lower extremity weakness, atrial fibrillation, essential hypertension -recently discharged from [facility] 09/06/2022 with cellulitis of the lower extremity.  She was discharged to a rehab facility where she was for a few weeks.  She subsequently was transferred to ALF 6/26 where she fell in the bathroom.    PT Comments    Patient progressing slightly with ambulation distance with encouragement.  Son present and helping to keep chair close for pt to rest.  Noted BP stable with supine to sit to stand and still with HR a-fib as high as 143 during ambulation.  She has new R UE swelling and pain with ultrasound tech in after session.  Patient remains most appropriate for post-acute inpatient rehab (<3 hours/day).  PT will follow until d/c.    Recommendations for follow up therapy are one component of a multi-disciplinary discharge planning process, led by the attending physician.  Recommendations may be updated based on patient status, additional functional criteria and insurance authorization.  Follow Up Recommendations  Can patient physically be transported by private vehicle: Yes    Assistance Recommended at Discharge Frequent or constant Supervision/Assistance  Patient can return home with the following A little help with walking and/or transfers;A little help with bathing/dressing/bathroom;Assistance with cooking/housework;Assist for transportation;Direct supervision/assist for medications management   Equipment Recommendations  None recommended by PT    Recommendations for Other Services       Precautions / Restrictions Precautions Precautions: Fall      Mobility  Bed Mobility Overal bed mobility: Needs Assistance Bed Mobility: Supine to Sit     Supine to sit: Mod assist     General bed mobility comments: R arm with new swelling, errythema, son reports due to mastitis likely; needing lifting help for trunk and greatly increased time to scoot to EOB    Transfers Overall transfer level: Needs assistance Equipment used: Rolling walker (2 wheels) Transfers: Sit to/from Stand Sit to Stand: Mod assist           General transfer comment: posterior bias some lifting help to stand on multiple trials    Ambulation/Gait Ambulation/Gait assistance: Min assist, +2 safety/equipment (chair follow with son assist) Gait Distance (Feet): 12 Feet (& 18') Assistive device: Rolling walker (2 wheels) Gait Pattern/deviations: Step-to pattern, Step-through pattern, Decreased stride length, Trunk flexed, Wide base of support, Shuffle Gait velocity: very slow and labored     General Gait Details: cues for forward gaze, assist for balance, RW management and cues to encourage increased distance   Stairs             Wheelchair Mobility    Modified Rankin (Stroke Patients Only)       Balance Overall balance assessment: Needs assistance   Sitting balance-Leahy Scale: Fair     Standing balance support: Bilateral upper extremity supported Standing balance-Leahy Scale: Poor Standing balance comment: UE support and assist for balance                            Cognition Arousal/Alertness: Awake/alert Behavior During Therapy: WFL for tasks assessed/performed Overall Cognitive Status: Impaired/Different from baseline Area of Impairment: Attention, Problem solving  Current Attention Level: Sustained         Problem Solving: Slow processing, Decreased initiation, Requires verbal cues General Comments: again needing encouragement to push through fatigue/weakness, son in the room and reports  she won't do it if you give her a choice        Exercises      General Comments General comments (skin integrity, edema, etc.): BP testing to determine if orthostatic, but SBP supine 92, sitting 100, standing 124      Pertinent Vitals/Pain Pain Assessment Faces Pain Scale: Hurts little more Pain Location: R elbow Pain Descriptors / Indicators: Discomfort, Aching, Sore Pain Intervention(s): Monitored during session, Repositioned    Home Living                          Prior Function            PT Goals (current goals can now be found in the care plan section) Progress towards PT goals: Progressing toward goals    Frequency    Min 3X/week      PT Plan Current plan remains appropriate    Co-evaluation              AM-PAC PT "6 Clicks" Mobility   Outcome Measure  Help needed turning from your back to your side while in a flat bed without using bedrails?: A Little Help needed moving from lying on your back to sitting on the side of a flat bed without using bedrails?: A Lot Help needed moving to and from a bed to a chair (including a wheelchair)?: A Lot Help needed standing up from a chair using your arms (e.g., wheelchair or bedside chair)?: A Lot Help needed to walk in hospital room?: Total (+2 for chair follow) Help needed climbing 3-5 steps with a railing? : Total 6 Click Score: 11    End of Session Equipment Utilized During Treatment: Gait belt Activity Tolerance: Patient limited by fatigue Patient left: in chair;with call bell/phone within reach;with chair alarm set;Other (comment) (ultrasound tech in room)         Time: 1610-9604 PT Time Calculation (min) (ACUTE ONLY): 26 min  Charges:  $Gait Training: 8-22 mins $Therapeutic Activity: 8-22 mins                     Diana [name], PT Acute Rehabilitation Services Office:[phone] 10/18/2022    Diana [name] 10/18/2022, 12:01 PM

## 2022-10-18 NOTE — Progress Notes (Signed)
VASCULAR LAB    Right upper extremity venous duplex has been performed.  See CV proc for preliminary results.   Layliana Devins, RVT 10/18/2022, 12:07 PM

## 2022-10-18 NOTE — NC FL2 (Signed)
[place] MEDICAID FL2 LEVEL OF CARE FORM     IDENTIFICATION  Patient Name: Diana [name] Birthdate: 09/22/1923 Sex: female Admission Date (Current Location): 10/13/2022  [facility] and [place] Number:  Producer, television/film/video and Address:  [facility] [place], [address], [place], [place] [address]      Provider Number: [id number]  Attending Physician Name and Address:  Md, Trauma, MD  Relative Name and Phone Number:  Tanyeka Mathwig, phone: [phone]    Current Level of Care: Hospital Recommended Level of Care: Skilled Nursing Facility Prior Approval Number:    Date Approved/Denied:   PASRR Number: [id number]  Discharge Plan: SNF    Current Diagnoses: Patient Active Problem List   Diagnosis Date Noted   Hypokalemia 10/14/2022   Rib fractures 10/13/2022   Congestive heart failure (HCC) 09/09/2022   Dilated cardiomyopathy (HCC) 09/09/2022   Acute HFrEF (heart failure with reduced ejection fraction) (HCC) 09/08/2022   Fluid overload 09/03/2022   Troponin I above reference range 09/03/2022   Atrial fibrillation (HCC) 09/03/2022   Cellulitis of lower extremity 08/27/2022   Venous stasis ulcers of both lower extremities (HCC) 08/27/2022   Peripheral edema 06/30/2022   Hematoma 04/13/2022   Other chronic pain 06/26/2021   Burning with urination 03/01/2018   Iron deficiency anemia due to chronic blood loss 06/16/2017   Lymphedema of right arm 11/17/2016   Vaginal irritation 10/26/2016   Right-sided low back pain without sciatica 08/22/2015   Spinal stenosis in cervical region 08/07/2015   Cervical spondylosis with myelopathy 06/26/2015   Aortic valve calcification 06/03/2015   BPV (benign positional vertigo) 01/29/2015   Poor balance 10/07/2011   History of right breast cancer 03/29/2011   Vitamin D deficiency 10/17/2009   Prediabetes 10/17/2009   COPD (chronic obstructive pulmonary disease) (HCC) 12/19/2007   INSOMNIA, CHRONIC 10/25/2007    BACK PAIN, LUMBAR 03/01/2007   Hypothyroidism 01/18/2007   HYPONATREMIA, MILD 01/18/2007   Essential hypertension 01/18/2007    Orientation RESPIRATION BLADDER Height & Weight     Self, Time, Situation, Place  Normal External catheter Weight: 74.4 kg Height:  5\' 4"  (162.6 cm)  BEHAVIORAL SYMPTOMS/MOOD NEUROLOGICAL BOWEL NUTRITION STATUS      Continent Diet (soft, thin liquids)  AMBULATORY STATUS COMMUNICATION OF NEEDS Skin   Extensive Assist Verbally Bruising (bruising bilateral arms, eyes and legs)                       Personal Care Assistance Level of Assistance  Bathing Bathing Assistance: Maximum assistance Feeding assistance: Limited assistance       Functional Limitations Info             SPECIAL CARE FACTORS FREQUENCY  PT (By licensed PT), OT (By licensed OT)     PT Frequency: 5x weekly OT Frequency: 5x weekly            Contractures Contractures Info: Not present    Additional Factors Info  Code Status, Allergies Code Status Info: DNR Allergies Info: Meperidine HCL-Nausea/Vomiting           Current Medications (10/18/2022):  This is the current hospital active medication list Current Facility-Administered Medications  Medication Dose Route Frequency Provider Last Rate Last Admin   acetaminophen (TYLENOL) tablet 1,000 mg  1,000 mg Oral Q6H Andria Meuse, MD   1,000 mg at 10/18/22 1230   albuterol (PROVENTIL) (2.5 MG/3ML) 0.083% nebulizer solution 2.5 mg  2.5 mg Nebulization Q6H PRN Pokhrel, Laxman,  MD       aspirin EC tablet 81 mg  81 mg Oral Daily Andria Meuse, MD   81 mg at 10/18/22 0936   atorvastatin (LIPITOR) tablet 40 mg  40 mg Oral Daily Andria Meuse, MD   40 mg at 10/18/22 1610   docusate sodium (COLACE) capsule 100 mg  100 mg Oral BID Andria Meuse, MD   100 mg at 10/18/22 0936   doxycycline (VIBRA-TABS) tablet 100 mg  100 mg Oral Q12H Juliet Rude, PA-C   100 mg at 10/18/22 1230   enoxaparin (LOVENOX)  injection 30 mg  30 mg Subcutaneous Q24H Bell, Lorin C, RPH       hydrALAZINE (APRESOLINE) injection 10 mg  10 mg Intravenous Q2H PRN Andria Meuse, MD       HYDROmorphone (DILAUDID) injection 0.5 mg  0.5 mg Intravenous Q3H PRN Andria Meuse, MD       levothyroxine (SYNTHROID) tablet 100 mcg  100 mcg Oral Daily Andria Meuse, MD   100 mcg at 10/18/22 0641   lidocaine (LIDODERM) 5 % 1 patch  1 patch Transdermal Q24H Alan Mulder, MD   1 patch at 10/17/22 2232   metoprolol succinate (TOPROL-XL) 24 hr tablet 25 mg  25 mg Oral BID Andria Meuse, MD   25 mg at 10/18/22 0936   metoprolol tartrate (LOPRESSOR) injection 5 mg  5 mg Intravenous Q6H PRN Andria Meuse, MD       ondansetron (ZOFRAN-ODT) disintegrating tablet 4 mg  4 mg Oral Q6H PRN Andria Meuse, MD       Or   ondansetron [facility]) injection 4 mg  4 mg Intravenous Q6H PRN Andria Meuse, MD   4 mg at 10/16/22 9604   oxyCODONE (Oxy IR/ROXICODONE) immediate release tablet 5 mg  5 mg Oral Q6H PRN Andria Meuse, MD   5 mg at 10/17/22 2232   polyethylene glycol (MIRALAX / GLYCOLAX) packet 17 g  17 g Oral Daily PRN Andria Meuse, MD   17 g at 10/14/22 1758   senna (SENOKOT) tablet 8.6 mg  1 tablet Oral Daily PRN Andria Meuse, MD   8.6 mg at 10/14/22 1758   sodium chloride 0.9 % bolus 500 mL  500 mL Intravenous Once Juliet Rude, PA-C       tamsulosin [facility]) capsule 0.4 mg  0.4 mg Oral Daily Juliet Rude, PA-C   0.4 mg at 10/18/22 5409   traZODone (DESYREL) tablet 25 mg  25 mg Oral QHS PRN Andria Meuse, MD   25 mg at 10/17/22 2107     Discharge Medications: Please see discharge summary for a list of discharge medications.  Relevant Imaging Results:  Relevant Lab Results:   Additional Information SSN: [id number]  Quintella Baton, RN, BSN  Trauma/Neuro ICU Case Manager [phone]

## 2022-10-18 NOTE — Care Management Important Message (Signed)
Important Message  Patient Details  Name: Diana [name] MRN: [id number] Date of Birth: 04/02/1924   Medicare Important Message Given:  Yes     Sherilyn Banker 10/18/2022, 1:43 PM

## 2022-10-18 NOTE — Discharge Instructions (Signed)
RIB FRACTURES  HOME INSTRUCTIONS   PAIN CONTROL:  Pain is best controlled by a usual combination of three different methods TOGETHER:  Ice/Heat Over the counter pain medication Prescription pain medication You may experience some swelling and bruising in area of broken ribs. Ice packs or heating pads (30-60 minutes up to 6 times a day) will help. Use ice for the first few days to help decrease swelling and bruising, then switch to heat to help relax tight/sore spots and speed recovery. Some people prefer to use ice alone, heat alone, alternating between ice & heat. Experiment to what works for you. Swelling and bruising can take several weeks to resolve.  It is helpful to take an over-the-counter pain medication regularly for the first few weeks. Choose one of the following that works best for you:  Naproxen (Aleve, etc) Two 220mg tabs twice a day Ibuprofen (Advil, etc) Three 200mg tabs four times a day (every meal & bedtime) Acetaminophen (Tylenol, etc) 500-650mg four times a day (every meal & bedtime) A prescription for pain medication (such as oxycodone, hydrocodone, etc) may be given to you upon discharge. Take your pain medication as prescribed.  If you are having problems/concerns with the prescription medicine (does not control pain, nausea, vomiting, rash, itching, etc), please call us [phone] to see if we need to switch you to a different pain medicine that will work better for you and/or control your side effect better. If you need a refill on your pain medication, please contact your pharmacy. They will contact our office to request authorization. Prescriptions will not be filled after 5 pm or on week-ends. Avoid getting constipated. When taking pain medications, it is common to experience some constipation. Increasing fluid intake and taking a fiber supplement (such as Metamucil, Citrucel, FiberCon, MiraLax, etc) 1-2 times a day regularly will usually help prevent this problem  from occurring. A mild laxative (prune juice, Milk of Magnesia, MiraLax, etc) should be taken according to package directions if there are no bowel movements after 48 hours.  Watch out for diarrhea. If you have many loose bowel movements, simplify your diet to bland foods & liquids for a few days. Stop any stool softeners and decrease your fiber supplement. Switching to mild anti-diarrheal medications (Kayopectate, Pepto Bismol) can help. If this worsens or does not improve, please call us. FOLLOW UP  If a follow up appointment is needed one will be scheduled for you. If none is needed with our trauma team, please follow up with your primary care provider within 2-3 weeks from discharge. Please call [facility] at [phone] if you have any questions about follow up.  If you have any orthopedic or other injuries you will need to follow up as outlined in your follow up instructions.   WHEN TO CALL US [phone]:  Poor pain control Reactions / problems with new medications (rash/itching, nausea, etc)  Fever over 101.5 F (38.5 C) Worsening swelling or bruising Worsening pain, productive cough, difficulty breathing or any other concerning symptoms  The clinic staff is available to answer your questions during regular business hours (8:30am-5pm). Please don't hesitate to call and ask to speak to one of our nurses for clinical concerns.  If you have a medical emergency, go to the nearest emergency room or call 911.  A surgeon from [facility] is always on call at the hospitals   [facility]  [address], [address], [place], [place] [address] ?  MAIN: [phone] ? TOLL FREE: [phone] ?  FAX [phone]  [email or web address]      Information on Rib Fractures  A rib fracture is a break or crack in one of the bones of the ribs. The ribs are long, curved bones that wrap around your chest and attach to your spine and your breastbone. The  ribs protect your heart, lungs, and other organs in the chest. A broken or cracked rib is often painful but is not usually serious. Most rib fractures heal on their own over time. However, rib fractures can be more serious if multiple ribs are broken or if broken ribs move out of place and push against other structures or organs. What are the causes? This condition is caused by: Repetitive movements with high force, such as pitching a baseball or having severe coughing spells. A direct blow to the chest, such as a sports injury, a car accident, or a fall. Cancer that has spread to the bones, which can weaken bones and cause them to break. What are the signs or symptoms? Symptoms of this condition include: Pain when you breathe in or cough. Pain when someone presses on the injured area. Feeling short of breath. How is this diagnosed? This condition is diagnosed with a physical exam and medical history. Imaging tests may also be done, such as: Chest X-ray. CT scan. MRI. Bone scan. Chest ultrasound. How is this treated? Treatment for this condition depends on the severity of the fracture. Most rib fractures usually heal on their own in 1-3 months. Sometimes healing takes longer if there is a cough that does not stop or if there are other activities that make the injury worse (aggravating factors). While you heal, you will be given medicines to control the pain. You will also be taught deep breathing exercises. Severe injuries may require hospitalization or surgery. Follow these instructions at home: Managing pain, stiffness, and swelling If directed, apply ice to the injured area. Put ice in a plastic bag. Place a towel between your skin and the bag. Leave the ice on for 20 minutes, 2-3 times a day. Take over-the-counter and prescription medicines only as told by your health care provider. Activity Avoid a lot of activity and any activities or movements that cause pain. Be careful during  activities and avoid bumping the injured rib. Slowly increase your activity as told by your health care provider. General instructions Do deep breathing exercises as told by your health care provider. This helps prevent pneumonia, which is a common complication of a broken rib. Your health care provider may instruct you to: Take deep breaths several times a day. Try to cough several times a day, holding a pillow against the injured area. Use a device called incentive spirometer to practice deep breathing several times a day. Drink enough fluid to keep your urine pale yellow. Do not wear a rib belt or binder. These restrict breathing, which can lead to pneumonia. Keep all follow-up visits as told by your health care provider. This is important. Contact a health care provider if: You have a fever. Get help right away if: You have difficulty breathing or you are short of breath. You develop a cough that does not stop, or you cough up thick or bloody sputum. You have nausea, vomiting, or pain in your abdomen. Your pain gets worse and medicine does not help. Summary A rib fracture is a break or crack in one of the bones of the ribs. A broken or cracked rib is   often painful but is not usually serious. Most rib fractures heal on their own over time. Treatment for this condition depends on the severity of the fracture. Avoid a lot of activity and any activities or movements that cause pain. This information is not intended to replace advice given to you by your health care provider. Make sure you discuss any questions you have with your health care provider. Document Released: 04/05/2005 Document Revised: 07/05/2016 Document Reviewed: 07/05/2016 Elsevier Interactive Patient Education  2019 [employer]  

## 2022-10-18 NOTE — Progress Notes (Signed)
Occupational Therapy Treatment Patient Details Name: Diana [name] MRN: [id number] DOB: 1923-09-15 Today's Date: 10/18/2022   History of present illness Pt is a 89 year old admitted 10/13/22 after mechanical fall. Dx with rib fractures L 2-5, scalp laceration and hematoma and small R PE. PMH includes chronic hearing loss, progressive bilateral lower extremity weakness, atrial fibrillation, essential hypertension -recently discharged from [facility] 09/06/2022 with cellulitis of the lower extremity.  She was discharged to a rehab facility where she was for a few weeks.  She subsequently was transferred to ALF 6/26 where she fell in the bathroom.   OT comments  Pt fatigued and asking to return to bed.  Patient attempted LB dressing with max assist, grooming with setup.  Limited by R UE pain and edema, but cleared by medical team to be seen by OT.  Patient requires mod assist to transfer and min assist to step to bed, returned to bed with min assist.  Updated dc plan to inpatient setting <3hrs/day to optimize independence, safety with ADLs and mobility. Will follow.    Recommendations for follow up therapy are one component of a multi-disciplinary discharge planning process, led by the attending physician.  Recommendations may be updated based on patient status, additional functional criteria and insurance authorization.    Assistance Recommended at Discharge Frequent or constant Supervision/Assistance  Patient can return home with the following  A lot of help with bathing/dressing/bathroom;A lot of help with walking and/or transfers;Two people to help with bathing/dressing/bathroom;Direct supervision/assist for medications management;Direct supervision/assist for financial management;Assist for transportation;Help with stairs or ramp for entrance;Assistance with cooking/housework   Equipment Recommendations  None recommended by OT    Recommendations for Other Services      Precautions / Restrictions  Precautions Precautions: Fall Restrictions Weight Bearing Restrictions: No       Mobility Bed Mobility Overal bed mobility: Needs Assistance Bed Mobility: Sit to Supine       Sit to supine: Min assist        Transfers Overall transfer level: Needs assistance Equipment used: Rolling walker (2 wheels) Transfers: Sit to/from Stand Sit to Stand: Mod assist           General transfer comment: B hands on walker, mod assist to power up     Balance Overall balance assessment: Needs assistance   Sitting balance-Leahy Scale: Fair     Standing balance support: Bilateral upper extremity supported, During functional activity Standing balance-Leahy Scale: Poor Standing balance comment: UE support and assist for balance                           ADL either performed or assessed with clinical judgement   ADL Overall ADL's : Needs assistance/impaired     Grooming: Set up;Sitting               Lower Body Dressing: Maximal assistance;Sit to/from stand   Toilet Transfer: Moderate assistance;Rolling walker (2 wheels);Ambulation Toilet Transfer Details (indicate cue type and reason): simulated from recliner to bed         Functional mobility during ADLs: Minimal assistance;Rolling walker (2 wheels);Cueing for safety      Extremity/Trunk Assessment Upper Extremity Assessment Upper Extremity Assessment: RUE deficits/detail RUE Deficits / Details: R UE redness and edema, limiting functional use.  medical team ruling out DVT but believe it is cellulitis RUE Coordination: decreased gross motor            Vision  Perception     Praxis      Cognition Arousal/Alertness: Awake/alert Behavior During Therapy: WFL for tasks assessed/performed Overall Cognitive Status: Impaired/Different from baseline Area of Impairment: Attention, Problem solving                   Current Attention Level: Sustained         Problem Solving: Slow  processing, Decreased initiation, Requires verbal cues General Comments: pt HOH, follow most commands with increased time but limited by decreased problem solving.        Exercises      Shoulder Instructions       General Comments VSS on RA during session    Pertinent Vitals/ Pain       Pain Assessment Pain Assessment: Faces Faces Pain Scale: Hurts little more Pain Location: R arm Pain Descriptors / Indicators: Discomfort, Aching, Sore Pain Intervention(s): Limited activity within patient's tolerance, Monitored during session, Repositioned  Home Living                                          Prior Functioning/Environment              Frequency  Min 2X/week        Progress Toward Goals  OT Goals(current goals can now be found in the care plan section)  Progress towards OT goals: Progressing toward goals  Acute Rehab OT Goals Patient Stated Goal: get better OT Goal Formulation: With patient/family Time For Goal Achievement: 10/28/22 Potential to Achieve Goals: Good  Plan Discharge plan needs to be updated;Frequency remains appropriate    Co-evaluation                 AM-PAC OT "6 Clicks" Daily Activity     Outcome Measure   Help from another person eating meals?: A Little Help from another person taking care of personal grooming?: A Little Help from another person toileting, which includes using toliet, bedpan, or urinal?: Total Help from another person bathing (including washing, rinsing, drying)?: A Lot Help from another person to put on and taking off regular upper body clothing?: A Lot Help from another person to put on and taking off regular lower body clothing?: A Lot 6 Click Score: 13    End of Session Equipment Utilized During Treatment: Gait belt;Rolling walker (2 wheels)  OT Visit Diagnosis: Unsteadiness on feet (R26.81);Muscle weakness (generalized) (M62.81);Pain Pain - Right/Left: Right Pain - part of body:   (arm)   Activity Tolerance Patient tolerated treatment well   Patient Left in bed;with call bell/phone within reach;with bed alarm set;with family/visitor present   Nurse Communication Mobility status        Time: 9562-1308 OT Time Calculation (min): 14 min  Charges: OT General Charges $OT Visit: 1 Visit OT Treatments $Self Care/Home Management : 8-22 mins  Barry Brunner, OT Acute Rehabilitation Services Office [phone]   Chancy Milroy 10/18/2022, 2:00 PM

## 2022-10-19 DIAGNOSIS — E876 Hypokalemia: Secondary | ICD-10-CM | POA: Diagnosis not present

## 2022-10-19 DIAGNOSIS — I10 Essential (primary) hypertension: Secondary | ICD-10-CM | POA: Diagnosis not present

## 2022-10-19 DIAGNOSIS — J449 Chronic obstructive pulmonary disease, unspecified: Secondary | ICD-10-CM | POA: Diagnosis not present

## 2022-10-19 DIAGNOSIS — S2242XA Multiple fractures of ribs, left side, initial encounter for closed fracture: Secondary | ICD-10-CM | POA: Diagnosis not present

## 2022-10-19 LAB — CBC
HCT: 31.4 % — ABNORMAL LOW (ref 36.0–46.0)
Hemoglobin: 10.2 g/dL — ABNORMAL LOW (ref 12.0–15.0)
MCH: 34.2 pg — ABNORMAL HIGH (ref 26.0–34.0)
MCHC: 32.5 g/dL (ref 30.0–36.0)
MCV: 105.4 fL — ABNORMAL HIGH (ref 80.0–100.0)
Platelets: 155 10*3/uL (ref 150–400)
RBC: 2.98 MIL/uL — ABNORMAL LOW (ref 3.87–5.11)
RDW: 18.6 % — ABNORMAL HIGH (ref 11.5–15.5)
WBC: 14.9 10*3/uL — ABNORMAL HIGH (ref 4.0–10.5)
nRBC: 0 % (ref 0.0–0.2)

## 2022-10-19 LAB — BASIC METABOLIC PANEL
Anion gap: 13 (ref 5–15)
BUN: 21 mg/dL (ref 8–23)
CO2: 21 mmol/L — ABNORMAL LOW (ref 22–32)
Calcium: 8.3 mg/dL — ABNORMAL LOW (ref 8.9–10.3)
Chloride: 92 mmol/L — ABNORMAL LOW (ref 98–111)
Creatinine, Ser: 1.1 mg/dL — ABNORMAL HIGH (ref 0.44–1.00)
GFR, Estimated: 46 mL/min — ABNORMAL LOW (ref >60)
Glucose, Bld: 99 mg/dL (ref 70–99)
Potassium: 3.9 mmol/L (ref 3.5–5.1)
Sodium: 126 mmol/L — ABNORMAL LOW (ref 135–145)

## 2022-10-19 LAB — OSMOLALITY: Osmolality: 273 mOsm/kg — ABNORMAL LOW (ref 275–295)

## 2022-10-19 MED ORDER — SODIUM CHLORIDE 1 G PO TABS
1.0000 g | ORAL_TABLET | Freq: Three times a day (TID) | ORAL | Status: DC
  Administered 2022-10-19 – 2022-10-26 (×21): 1 g via ORAL
  Filled 2022-10-19 (×22): qty 1

## 2022-10-19 MED ORDER — ORAL CARE MOUTH RINSE: 15.0000 mL | OROMUCOSAL | Status: DC | PRN

## 2022-10-19 NOTE — Progress Notes (Signed)
[facility] Progress Note     Subjective: [place] negative for RUE DVT - Son concerned it appears more red this AM. BM yesterday. Son agreeable to SNF placement   Objective: Vital signs in last 24 hours: Temp:  [97.3 F (36.3 C)-98.3 F (36.8 C)] 97.7 F (36.5 C) (07/02 0821) Pulse Rate:  [89-107] 98 (07/02 0821) Resp:  [11-20] 19 (07/02 0821) BP: (77-105)/(45-83) 102/68 (07/02 0821) SpO2:  [92 %-97 %] 92 % (07/02 0821) Weight:  [73.2 kg] 73.2 kg (07/02 0500) Last BM Date : 10/18/22  Intake/Output from previous day: 07/01 0701 - 07/02 0700 In: 480 [P.O.:480] Out: 650 [Urine:650] Intake/Output this shift: No intake/output data recorded.  PE: Gen:  Alert, elderly female, NAD HEENT: L peri-orbital ecchymosis  Card:  Regular rate and rhythm Pulm:  Normal effort on [place] Abd: Soft, non-tender, non-distended, left hemi-abdomen with no visible hematoma but palpable firmness/fullness in left hemi-abdomen  Ext: RUE with significant edema and erythema  Skin: warm and dry, no rashes  Psych: A&Ox3   Lab Results:  Recent Labs    10/18/22 0356 10/19/22 0719  WBC 16.2* 14.9*  HGB 9.8* 10.2*  HCT 30.0* 31.4*  PLT 144* 155    BMET Recent Labs    10/18/22 0356 10/19/22 0411  NA 128* 126*  K 4.2 3.9  CL 93* 92*  CO2 25 21*  GLUCOSE 90 99  BUN 19 21  CREATININE 1.19* 1.10*  CALCIUM 8.4* 8.3*    PT/INR No results for input(s): "LABPROT", "INR" in the last 72 hours.  CMP     Component Value Date/Time   NA 126 (L) 10/19/2022 0411   NA 136 (A) 07/02/2015 0000   K 3.9 10/19/2022 0411   CL 92 (L) 10/19/2022 0411   CO2 21 (L) 10/19/2022 0411   GLUCOSE 99 10/19/2022 0411   BUN 21 10/19/2022 0411   BUN 16 07/02/2015 0000   CREATININE 1.10 (H) 10/19/2022 0411   CREATININE 1.27 (H) 08/27/2022 1609   CALCIUM 8.3 (L) 10/19/2022 0411   PROT 6.3 (L) 10/13/2022 1611   ALBUMIN 3.2 (L) 10/13/2022 1611   AST 21 10/13/2022 1611   ALT 15 10/13/2022 1611   ALKPHOS 86  10/13/2022 1611   BILITOT 1.1 10/13/2022 1611   GFRNONAA 46 (L) 10/19/2022 0411   GFRAA >60 01/10/2017 1248   Lipase  No results found for: "LIPASE"     Studies/Results: VAS [place] UPPER EXTREMITY VENOUS DUPLEX  Result Date: 10/19/2022 UPPER VENOUS STUDY  Patient Name:  Diana [name]  Date of Exam:   10/18/2022 Medical Rec #: [id number]         Accession #:    [id number] Date of Birth: 10/05/1923         Patient Gender: F Patient Age:   89 years Exam Location:  [facility] Procedure:      VAS [place] UPPER EXTREMITY VENOUS DUPLEX Referring Phys: Trixie Deis --------------------------------------------------------------------------------  Indications: Pain, and Edema Risk Factors: Status post fall with multi trauma. History of breast cancer, s/p mastectomy. History of recurrent mastitis. Limitations: Pain with touch, significant edema throughout upper extremity. Comparison Study: No prior study Performing Technologist: Sherren Kerns RVS  Examination Guidelines: A complete evaluation includes B-mode imaging, spectral Doppler, color Doppler, and power Doppler as needed of all accessible portions of each vessel. Bilateral testing is considered an integral part of a complete examination. Limited examinations for reoccurring indications may be performed as noted.  Right Findings: +----------+------------+---------+-----------+----------+---------------+ RIGHT  CompressiblePhasicitySpontaneousProperties    Summary     +----------+------------+---------+-----------+----------+---------------+ IJV           Full       Yes       Yes                              +----------+------------+---------+-----------+----------+---------------+ Subclavian    Full       Yes       Yes                              +----------+------------+---------+-----------+----------+---------------+ Axillary                 Yes       Yes                               +----------+------------+---------+-----------+----------+---------------+ Brachial                 Yes       Yes                              +----------+------------+---------+-----------+----------+---------------+ Radial                                              patent by color +----------+------------+---------+-----------+----------+---------------+ Ulnar                                               patent by color +----------+------------+---------+-----------+----------+---------------+ Cephalic      Full       Yes       Yes                              +----------+------------+---------+-----------+----------+---------------+ Basilic                  Yes       Yes                              +----------+------------+---------+-----------+----------+---------------+  Left Findings: +----------+------------+---------+-----------+----------+-------+ LEFT      CompressiblePhasicitySpontaneousPropertiesSummary +----------+------------+---------+-----------+----------+-------+ Subclavian               Yes       Yes                      +----------+------------+---------+-----------+----------+-------+  Summary:  Right: No evidence of deep vein thrombosis in the upper extremity. No evidence of superficial vein thrombosis in the upper extremity.  Left: No evidence of thrombosis in the subclavian.  *See table(s) above for measurements and observations.  Diagnosing physician: Waverly Ferrari MD Electronically signed by Waverly Ferrari MD on 10/19/2022 at 8:31:10 AM.    Final     Anti-infectives: Anti-infectives (From admission, onward)    Start     Dose/Rate Route Frequency Ordered Stop   10/18/22 1215  doxycycline (VIBRA-TABS) tablet 100 mg  Status:  Discontinued        100 mg Oral Every  12 hours 10/18/22 1123 10/19/22 1049        Assessment/Plan  89yoF s/p fall Left 2-5 rib fractures - IS 10x/hr while awake; multidmodal pain control, wean O2  as able Rectus sheath hematoma - monitor abdominal exam, no extrav on CT; hgb stable at 10.2 Left ear lac - repaired by EDP with suture Left forehead lac - repaired by EDP with dermabond Chronic constipation - based on CT, large rectal stool burden. Bowel regimen, Miralax BID and senna BID  RUE cellulitis - WBC 14 from 16, IV abx, ACE to RUE, [place] negative for DVT   Afib - hold anticoagulation 29 mm suprasellar mass with intrasellar component - ?pituitary macroadenoma vs planum sphenoidale meningioma, surgical intervention not appropriate, NS consult deferred    PPX: SCDs; prophylacitc lovenox started 6/29 FEN: soft diet, SLIV ID: PO doxy as above  Dispo - 4NP, PT/OT re-eval and dispo pending this. Pt medically stable for discharge when dispo determined    LOS: 6 days   I reviewed nursing notes, hospitalist notes, last 24 h vitals and pain scores, last 48 h intake and output, last 24 h labs and trends, and last 24 h imaging results.  Juliet Rude, [facility] 10/19/2022, 10:49 AM Please see Amion for pager number during day hours 7:00am-4:30pm

## 2022-10-19 NOTE — TOC Progression Note (Addendum)
Transition of Care [facility]) - Progression Note    Patient Details  Name: Diana [name] MRN: [id number] Date of Birth: 12/03/1923  [facility]) CM/SW Contact  Glennon Mac, RN Phone Number: 10/19/2022, 10:05am  Clinical Narrative:    Patient has received bed offer for [facility] skilled nursing facility, which is first choice for patient/family.  Notified patient's son, Diana [name], and he is pleased with this news.  He would like to proceed with placement at [facility].  Left message with Arna Medici [phone]) requesting callback for acceptance of bed.  Addendum: 1426 Left additional message with Harvie Heck at facility; he states no one available in admissions at this time.  He states he will leave a message with director and have them call me back ASAP.  Addendum: 1540  Spoke with Alvino Chapel in admissions at [facility]; she states bed will be available tomorrow for patient.  Will initiate insurance authorization through [facility]. [employer] Auth ID [id number]    Expected Discharge Plan: Skilled Nursing Facility Barriers to Discharge: Continued Medical Work up  Expected Discharge Plan and Services   Discharge Planning Services: CM Consult Post Acute Care Choice: Skilled Nursing Facility                             HH Arranged: PT, OT, Speech Therapy HH Agency: [facility] Date [facility] Agency Contacted: 10/14/22 Time HH Agency Contacted: 1707 Representative spoke with at [facility] Agency: Angie   Social Determinants of Health (SDOH) Interventions SDOH Screenings   Food Insecurity: No Food Insecurity (09/03/2022)  Housing: Low Risk  (09/03/2022)  Transportation Needs: No Transportation Needs (09/03/2022)  Utilities: Not At Risk (09/03/2022)  Alcohol Screen: Low Risk  (06/10/2022)  Depression (PHQ2-9): Low Risk  (06/10/2022)  Financial Resource Strain: Low Risk  (06/10/2022)  Physical Activity: Inactive (06/10/2022)  Social Connections: Socially Isolated  (06/10/2022)  Stress: No Stress Concern Present (06/10/2022)  Tobacco Use: Medium Risk (10/13/2022)    Readmission Risk Interventions     No data to display         Quintella Baton, RN, BSN  Trauma/Neuro ICU Case Manager [phone]

## 2022-10-19 NOTE — Progress Notes (Signed)
PROGRESS NOTE    TIEA CHEUVRONT  WGN:[id number] DOB: [birth year] DOA: 10/13/2022 PCP: Excell Seltzer, MD    Brief Narrative:   Ms Diana [name] is a 89 year old female with past medical history of obstructive sleep apnea, hypertension, hypothyroidism, COPD presented to the hospital after mechanical fall.  Workup in the emergency department revealed rib fractures as well as scalp hematoma.  Patient was then admitted to the trauma service for further workup.  Medicine team was consulted for medication management.  Assessment and plan.  Fall with Left scalp hematoma, Left-sided rib fractures Management per trauma team.  Continue pain management.  Physical therapy evaluation recommend home health. Continue incentive spirometry.  Right upper extremity swelling.   Patient is son at bedside states that the patient does have a lymphedema in the right upper extremity and when it gets infected it swells up and gets red.  He strongly wishes for an antibiotic.  Venous ultrasound was negative for DVT.  Rectus sheath hematoma.  Being managed conservatively.  Monitor CBC. Hemoglobin today at 10.2 from 9.8  CAD- continue aspirin, Lipitor, metoprolol. No active chest pain.   Concern for cranial aneurysm incidental finding noted on  initial CT head scan.  Radiology  recommended  CTA head and neck to rule out ACOM aneurysm.  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  Stenosis of the left vertebral artery.   Discussed this finding with the patient's son at bedside.  Will need follow-up with neurosurgery as outpatient.  Spoke with Dr. Johnsie Cancel in neurosurgery who recommended outpatient follow-up.  Patient unlikely to be a surgical candidate due to advanced age with frailty and family wishes.   Hyperlipidemia-continue Lipitor   History of chronic systolic heart failure Continue Lasix, metoprolol.   Intake and output charting, daily weights.  Review of previous TTE in May showed EF 25%.  Patient is negative balance for 805 mL.   Hypothyroidism-continue Synthroid   Insomnia-continue trazodone   Hyponatremia - continue to monitor.  Sodium level at 126 today from 128.  Off Lasix at this time.  Will also get serum osmolality, urine osmolality and urinary sodium.  Has been started on sodium tablet by primary team.  Hypokalemia-has improved.  Latest potassium of 3.9.  Chronic constipation.  Continue bowel regimen.   History of atrial fibrillation.  Anticoagulation hold.  Continue SCD.  Hemoglobin today at 10.2.      DVT prophylaxis: enoxaparin (LOVENOX) injection 30 mg Start: 10/18/22 2200 SCDs Start: 10/13/22 2116   Code Status:     Code Status: DNR  Disposition:   PT recommending rehabilitation.  Likely to skilled nursing facility.    Status is: Inpatient   Family Communication:  Spoke with the patient's son at bedside on 7/2  Procedures:  None  Antimicrobials:  None  Anti-infectives (From admission, onward)    Start     Dose/Rate Route Frequency Ordered Stop   10/18/22 1215  doxycycline (VIBRA-TABS) tablet 100 mg  Status:  Discontinued        100 mg Oral Every 12 hours 10/18/22 1123 10/19/22 1049       Subjective:  Today, patient was seen and examined at bedside.  Complains of right arm swelling.  Denies any shortness of breath fever or chills.  Son at bedside and requesting antibiotic for redness of right upper extremity.    Objective: Vitals:   10/19/22 0329 10/19/22 0500 10/19/22 7846 10/19/22  1057  BP: 93/61  102/68 101/84  Pulse: 99  98 99  Resp: 20  19 18   Temp:   97.7 F (36.5 C) 97.6 F (36.4 C)  TempSrc:   Oral Axillary  SpO2: 97%  92% 96%  Weight:  73.2 kg    Height:        Intake/Output Summary (Last 24 hours) at 10/19/2022 1148 Last data filed at 10/19/2022 0527 Gross per 24 hour  Intake 360 ml  Output 450 ml  Net -90 ml    Filed  Weights   10/17/22 0416 10/18/22 0500 10/19/22 0500  Weight: 72.5 kg 74.4 kg 73.2 kg    Physical Examination: Body mass index is 27.7 kg/m.   General:  Average built, not in obvious distress, elderly female, weak and deconditioned.   HENT:   Moist mucosa.  Left periorbital ecchymosis more than the right.  Left forehead laceration status postrepair. Chest.  Diminished breath sounds bilaterally.  Tenderness over the left chest wall on palpation. CVS: S1 &S2 heard. No murmur.  Regular rate and rhythm. Abdomen: Soft, nontender, nondistended.  Bowel sounds are heard.  Left abdomen palpable fullness. Extremities: No cyanosis, clubbing or edema.  Left upper extremity bruises.  Right upper extremity edema and swelling. Psych: Alert, awake and Communicative, CNS:  No cranial nerve deficits.  Moves extremities. Skin: Warm and dry.  Bruises noted over the skin.  Data Reviewed:   CBC: Recent Labs  Lab 10/15/22 0435 10/16/22 0316 10/17/22 0305 10/18/22 0356 10/19/22 0719  WBC 5.9 6.3 9.4 16.2* 14.9*  HGB 9.6* 9.7* 10.1* 9.8* 10.2*  HCT 30.6* 30.5* 31.0* 30.0* 31.4*  MCV 104.4* 105.9* 102.0* 104.2* 105.4*  PLT 163 167 163 144* 155     Basic Metabolic Panel: Recent Labs  Lab 10/15/22 0435 10/16/22 0316 10/17/22 0305 10/18/22 0356 10/19/22 0411  NA 132* 129* 128* 128* 126*  K 3.7 3.9 5.3* 4.2 3.9  CL 98 96* 95* 93* 92*  CO2 24 22 21* 25 21*  GLUCOSE 85 107* 93 90 99  BUN 18 19 20 19 21   CREATININE 1.04* 1.09* 1.17* 1.19* 1.10*  CALCIUM 8.6* 8.6* 8.4* 8.4* 8.3*  MG 2.2 2.2  --  2.2  --      Liver Function Tests: Recent Labs  Lab 10/13/22 1611  AST 21  ALT 15  ALKPHOS 86  BILITOT 1.1  PROT 6.3*  ALBUMIN 3.2*      Radiology Studies: VAS [place] UPPER EXTREMITY VENOUS DUPLEX  Result Date: 10/19/2022 UPPER VENOUS STUDY  Patient Name:  AYMEE DENMON  Date of Exam:   10/18/2022 Medical Rec #: [id number]         Accession #:    [id number] Date of Birth: 1923/11/11          Patient Gender: F Patient Age:   69 years Exam Location:  [facility] Procedure:      VAS [place] UPPER EXTREMITY VENOUS DUPLEX Referring Phys: Trixie Deis --------------------------------------------------------------------------------  Indications: Pain, and Edema Risk Factors: Status post fall with multi trauma. History of breast cancer, s/p mastectomy. History of recurrent mastitis. Limitations: Pain with touch, significant edema throughout upper extremity. Comparison Study: No prior study Performing Technologist: Sherren Kerns RVS  Examination Guidelines: A complete evaluation includes B-mode imaging, spectral Doppler, color Doppler, and power Doppler as needed of all accessible portions of each vessel. Bilateral testing is considered an integral part of a complete examination. Limited examinations for reoccurring indications may be performed as noted.  Right Findings: +----------+------------+---------+-----------+----------+---------------+ RIGHT     CompressiblePhasicitySpontaneousProperties    Summary     +----------+------------+---------+-----------+----------+---------------+ IJV           Full       Yes       Yes                              +----------+------------+---------+-----------+----------+---------------+ Subclavian    Full       Yes       Yes                              +----------+------------+---------+-----------+----------+---------------+ Axillary                 Yes       Yes                              +----------+------------+---------+-----------+----------+---------------+ Brachial                 Yes       Yes                              +----------+------------+---------+-----------+----------+---------------+ Radial                                              patent by color +----------+------------+---------+-----------+----------+---------------+ Ulnar                                               patent by color  +----------+------------+---------+-----------+----------+---------------+ Cephalic      Full       Yes       Yes                              +----------+------------+---------+-----------+----------+---------------+ Basilic                  Yes       Yes                              +----------+------------+---------+-----------+----------+---------------+  Left Findings: +----------+------------+---------+-----------+----------+-------+ LEFT      CompressiblePhasicitySpontaneousPropertiesSummary +----------+------------+---------+-----------+----------+-------+ Subclavian               Yes       Yes                      +----------+------------+---------+-----------+----------+-------+  Summary:  Right: No evidence of deep vein thrombosis in the upper extremity. No evidence of superficial vein thrombosis in the upper extremity.  Left: No evidence of thrombosis in the subclavian.  *See table(s) above for measurements and observations.  Diagnosing physician: Waverly Ferrari MD Electronically signed by Waverly Ferrari MD on 10/19/2022 at 8:31:10 AM.    Final       LOS: 6 days   Joycelyn Das, MD [facility] Available via Epic secure chat 7am-7pm After these hours, please refer to coverage provider listed on [email or web address] 10/19/2022, 11:48 AM

## 2022-10-19 NOTE — Progress Notes (Signed)
ACE  wrap applied to RUE per order.

## 2022-10-20 DIAGNOSIS — L03113 Cellulitis of right upper limb: Secondary | ICD-10-CM

## 2022-10-20 DIAGNOSIS — D539 Nutritional anemia, unspecified: Secondary | ICD-10-CM | POA: Diagnosis not present

## 2022-10-20 DIAGNOSIS — I4891 Unspecified atrial fibrillation: Secondary | ICD-10-CM

## 2022-10-20 LAB — BASIC METABOLIC PANEL
Anion gap: 11 (ref 5–15)
BUN: 20 mg/dL (ref 8–23)
CO2: 22 mmol/L (ref 22–32)
Calcium: 8.5 mg/dL — ABNORMAL LOW (ref 8.9–10.3)
Chloride: 94 mmol/L — ABNORMAL LOW (ref 98–111)
Creatinine, Ser: 1.16 mg/dL — ABNORMAL HIGH (ref 0.44–1.00)
GFR, Estimated: 43 mL/min — ABNORMAL LOW (ref >60)
Glucose, Bld: 100 mg/dL — ABNORMAL HIGH (ref 70–99)
Potassium: 3.6 mmol/L (ref 3.5–5.1)
Sodium: 127 mmol/L — ABNORMAL LOW (ref 135–145)

## 2022-10-20 LAB — CBC
HCT: 31 % — ABNORMAL LOW (ref 36.0–46.0)
Hemoglobin: 9.9 g/dL — ABNORMAL LOW (ref 12.0–15.0)
MCH: 33.1 pg (ref 26.0–34.0)
MCHC: 31.9 g/dL (ref 30.0–36.0)
MCV: 103.7 fL — ABNORMAL HIGH (ref 80.0–100.0)
Platelets: 145 10*3/uL — ABNORMAL LOW (ref 150–400)
RBC: 2.99 MIL/uL — ABNORMAL LOW (ref 3.87–5.11)
RDW: 18.6 % — ABNORMAL HIGH (ref 11.5–15.5)
WBC: 12.3 10*3/uL — ABNORMAL HIGH (ref 4.0–10.5)
nRBC: 0 % (ref 0.0–0.2)

## 2022-10-20 LAB — MAGNESIUM: Magnesium: 2.2 mg/dL (ref 1.7–2.4)

## 2022-10-20 MED ORDER — SODIUM CHLORIDE 0.9 % IV SOLN
2.0000 g | Freq: Every day | INTRAVENOUS | Status: DC
  Administered 2022-10-20 – 2022-10-23 (×4): 2 g via INTRAVENOUS
  Filled 2022-10-20 (×4): qty 12.5

## 2022-10-20 MED ORDER — DOXYCYCLINE HYCLATE 100 MG PO TABS
100.0000 mg | ORAL_TABLET | Freq: Two times a day (BID) | ORAL | Status: DC
  Administered 2022-10-20 – 2022-10-26 (×12): 100 mg via ORAL
  Filled 2022-10-20 (×14): qty 1

## 2022-10-20 MED ORDER — SODIUM CHLORIDE 0.9 % IV SOLN: 2.0000 g | INTRAVENOUS | Status: DC

## 2022-10-20 NOTE — Progress Notes (Addendum)
Pharmacy Antibiotic Note  Diana [name] is a 89 y.o. female admitted on 10/13/2022 with cellulitis.  Pharmacy has been consulted for cefepime dosing. Team concerned for cellulitis, prior wound culture positive for pseudomonas on 09/05/22. During this admission, WBC 16.2 (10/18/22) > 12.3 (10/20/22). Today, patient afebrile and CrCl ~28 mL/min.   Plan: Start cefepime 2 gm q24h for pseudomonas coverage Continue doxycyline 100 mg q12h (per MD request)  Monitor vital signs, renal function, signs of clinical improvement, length of therapy, and de-escalation of antibiotics.   Height: 5\' 4"  (162.6 cm) Weight: 76 kg (167 lb 8.8 oz) IBW/kg (Calculated) : 54.7  Temp (24hrs), Avg:97.8 F (36.6 C), Min:97.4 F (36.3 C), Max:98.6 F (37 C)  Recent Labs  Lab 10/16/22 0316 10/17/22 0305 10/18/22 0356 10/19/22 0411 10/19/22 0719 10/20/22 0246  WBC 6.3 9.4 16.2*  --  14.9* 12.3*  CREATININE 1.09* 1.17* 1.19* 1.10*  --  1.16*    Estimated Creatinine Clearance: 28.3 mL/min (A) (by C-G formula based on SCr of 1.16 mg/dL (H)).    Allergies  Allergen Reactions   Meperidine Hcl Nausea And Vomiting    Antimicrobials this admission:  Cefepime 10/20/22 >>  Doxycyline 10/20/22 >>   Microbiology results: 09/05/22 Wound Cx: Pseudomonas  Thank you for allowing pharmacy to be a part of this patient's care.  Arma Heading 10/20/2022 10:22 AM

## 2022-10-20 NOTE — Progress Notes (Signed)
PT Cancellation Note  Patient Details Name: Diana [name] MRN: [id number] DOB: 04-19-1924   Cancelled Treatment:    Reason Eval/Treat Not Completed: Fatigue/lethargy limiting ability to participate Pt lethargic/unarousable.  RN reports pt with increased R UE pain and was given pain meds - sleeping since that time.  Attempted to arouse pt verbally and tactile.  She would briefly open eyes but then fall back asleep.  Her [place] was out of place and sats 89-91%.  Replaced [place], pt was laying on her side and required assist to turn head to don [place].  Again briefly opened eyes but then back asleep.  Pt too lethargic to participate.  Will f/u later date.   Anise Salvo, PT Acute Rehab [facility] Rehab [phone]   Rayetta Humphrey 10/20/2022, 2:54 PM

## 2022-10-20 NOTE — TOC Progression Note (Signed)
Transition of Care [facility]) - Progression Note    Patient Details  Name: Diana [name] MRN: [id number] Date of Birth: 02-18-1924  Transition of Care [facility]) CM/SW Contact  Glennon Mac, RN Phone Number: 10/20/2022, 4:37 PM  Clinical Narrative:    Patient has been transferred from Trauma Service to Hospitalist for medication management.  [facility] Berkley Harvey is still pending for SNF bed at [facility] upon medical stability.  Will follow progress.    Expected Discharge Plan: Skilled Nursing Facility Barriers to Discharge: Continued Medical Work up  Expected Discharge Plan and Services   Discharge Planning Services: CM Consult Post Acute Care Choice: Skilled Nursing Facility                             HH Arranged: PT, OT, Speech Therapy HH Agency: [facility] Date [facility] Agency Contacted: 10/14/22 Time HH Agency Contacted: 1707 Representative spoke with at [facility] Agency: Angie   Social Determinants of Health (SDOH) Interventions SDOH Screenings   Food Insecurity: No Food Insecurity (09/03/2022)  Housing: Low Risk  (09/03/2022)  Transportation Needs: No Transportation Needs (09/03/2022)  Utilities: Not At Risk (09/03/2022)  Alcohol Screen: Low Risk  (06/10/2022)  Depression (PHQ2-9): Low Risk  (06/10/2022)  Financial Resource Strain: Low Risk  (06/10/2022)  Physical Activity: Inactive (06/10/2022)  Social Connections: Socially Isolated (06/10/2022)  Stress: No Stress Concern Present (06/10/2022)  Tobacco Use: Medium Risk (10/13/2022)    Readmission Risk Interventions     No data to display         Quintella Baton, RN, BSN  Trauma/Neuro ICU Case Manager [phone]

## 2022-10-20 NOTE — Progress Notes (Signed)
Pt refusing compression wrap on RUE, stated "It felt like my arm was going to burst"

## 2022-10-20 NOTE — Progress Notes (Addendum)
PROGRESS NOTE    Diana [name]  ZOX:[id number] DOB: Jan 11, 1924 DOA: 10/13/2022 PCP: Excell Seltzer, MD    Brief Narrative:  Diana [name] is a 89 year old female with past medical history of obstructive sleep apnea, hypertension, hypothyroidism, COPD presented to the hospital after mechanical fall.  Workup in the emergency department revealed rib fractures as well as scalp hematoma.  Patient was then admitted to the trauma service for further workup.  Medicine team was consulted for medication management.  Subsequently there was concern for right upper extremity cellulitis as well as worsening tachycardia.  Patient was transferred to the medical service.  Assessment and plan:  Cellulitis involving right upper extremity Patient with longstanding lymphedema due to previous surgery for breast cancer.  According to patient's son she periodically gets infection in the arm.  On noted to be quite swollen and erythematous.  Doppler studies were negative for DVT.  WBC was noted to be elevated.   It looks like patient was given doxycycline but no longer on medication profile as of this morning.   Patient started on doxycycline as well as cefazolin.  Cefazolin changed over to cefepime due to recent isolation of Pseudomonas from lower extremity wound. Wait for clinical improvement.  Keep arm elevated.  Atrial fibrillation with RVR Heart rate is poorly controlled.  She did not receive her metoprolol yesterday due to low blood pressure.  Given this morning.  Blood pressure is stable.  Monitor heart rate. Was on apixaban prior to admission.  However considering her fall and multiple injuries as a result of the fall trauma service does not recommend resuming anticoagulation.  Fall with Left scalp hematoma, Left-sided rib fractures Trauma services following.  Incentive spirometry.  Rectus sheath hematoma.   Conservative management.  Hemoglobin has been stable.  Left ear laceration Repaired by ED provider  with suture.  Left forehead laceration Repaired by ED provider with Dermabond.  Coronary artery disease  Stable.  Continue aspirin, Lipitor, metoprolol. No active chest pain.   Concern for cranial aneurysm Incidental finding noted on  initial CT head scan.  Radiology  recommended  CTA head and neck to rule out ACOM aneurysm.  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  Stenosis of the left vertebral artery.   Discussed this finding with the patient's son at bedside.  Will need follow-up with neurosurgery as outpatient.  Spoke with Dr. Johnsie Cancel in neurosurgery who recommended outpatient follow-up.  Patient unlikely to be a surgical candidate due to advanced age with frailty and family wishes.   Hyperlipidemia Continue Lipitor   History of chronic systolic heart failure Continue Lasix, metoprolol.  Intake and output charting, daily weights.   Review of previous TTE in May showed EF 25%.     Hypothyroidism Continue Synthroid   Insomnia Continue trazodone   Hyponatremia Sodium level is low but stable for the most part.  Urine osmolality has not been done yet.  Started on salt tablets by trauma service.  Recheck labs tomorrow.    Hypokalemia Monitor closely.  Supplement as needed.  Macrocytic anemia Stable hemoglobin noted.  Chronic constipation Continue bowel regimen.   DVT prophylaxis: Lovenox Code Status: DNR Family communication: Discussed with patient's son Disposition: SNF when Medically stable.   Procedures:  None  Antimicrobials:  None  Anti-infectives (From admission, onward)    Start     Dose/Rate Route Frequency Ordered Stop   10/20/22 1200  ceFEPIme (MAXIPIME) 2 g in sodium chloride 0.9 % 100 mL IVPB  Status:  Discontinued        2 g 200 mL/hr over 30 Minutes Intravenous Every 24 hours 10/20/22 1020 10/20/22 1021   10/20/22 1115  ceFEPIme  (MAXIPIME) 2 g in sodium chloride 0.9 % 100 mL IVPB        2 g 200 mL/hr over 30 Minutes Intravenous Daily 10/20/22 1022     10/20/22 1030  doxycycline (VIBRA-TABS) tablet 100 mg        100 mg Oral Every 12 hours 10/20/22 0931     10/18/22 1215  doxycycline (VIBRA-TABS) tablet 100 mg  Status:  Discontinued        100 mg Oral Every 12 hours 10/18/22 1123 10/19/22 1049       Subjective: Patient does not appear to be in any discomfort.  Complains of right arm pain.  Denies any chest pain or shortness of breath.  No nausea or vomiting.  Objective: Vitals:   10/20/22 0318 10/20/22 0324 10/20/22 0748 10/20/22 1152  BP:   (!) 111/93 98/63  Pulse: (!) 105  (!) 126 (!) 102  Resp: 13  17 13   Temp:   (!) 97.4 F (36.3 C) (!) 97.5 F (36.4 C)  TempSrc: Axillary  Oral Axillary  SpO2: 92%  90% 96%  Weight:  76 kg    Height:        Intake/Output Summary (Last 24 hours) at 10/20/2022 1230 Last data filed at 10/19/2022 1408 Gross per 24 hour  Intake 120 ml  Output 100 ml  Net 20 ml    Filed Weights   10/18/22 0500 10/19/22 0500 10/20/22 0324  Weight: 74.4 kg 73.2 kg 76 kg    Physical Examination:   General appearance: Awake alert.  In no distress.  Hard of hearing Resp: Clear to auscultation bilaterally.  Normal effort Cardio: S1-S2 is normal regular.  No S3-S4.  No rubs murmurs or bruit GI: Abdomen is soft.  Nontender nondistended.  Bowel sounds are present normal.  No masses organomegaly Extremities: Significant swelling of the right upper extremity with erythema.  Tender.  Radial pulses palpable. Neurologic:   No focal neurological deficits.    Data Reviewed:   CBC: Recent Labs  Lab 10/16/22 0316 10/17/22 0305 10/18/22 0356 10/19/22 0719 10/20/22 0246  WBC 6.3 9.4 16.2* 14.9* 12.3*  HGB 9.7* 10.1* 9.8* 10.2* 9.9*  HCT 30.5* 31.0* 30.0* 31.4* 31.0*  MCV 105.9* 102.0* 104.2* 105.4* 103.7*  PLT 167 163 144* 155 145*     Basic Metabolic Panel: Recent Labs  Lab  10/15/22 0435 10/16/22 0316 10/17/22 0305 10/18/22 0356 10/19/22 0411 10/20/22 0246  NA 132* 129* 128* 128* 126* 127*  K 3.7 3.9 5.3* 4.2 3.9 3.6  CL 98 96* 95* 93* 92* 94*  CO2 24 22 21* 25 21* 22  GLUCOSE 85 107* 93 90 99 100*  BUN 18 19 20 19 21 20   CREATININE 1.04* 1.09* 1.17* 1.19* 1.10* 1.16*  CALCIUM 8.6* 8.6* 8.4* 8.4* 8.3* 8.5*  MG 2.2 2.2  --  2.2  --  2.2     Liver Function Tests: Recent Labs  Lab 10/13/22 1611  AST 21  ALT 15  ALKPHOS 86  BILITOT 1.1  PROT 6.3*  ALBUMIN 3.2*      Radiology Studies: No results found.    LOS: 7 days   Osvaldo Shipper, MD [facility] Available via Epic secure chat 7am-7pm After these hours, please  refer to coverage provider listed on [email or web address] 10/20/2022, 12:30 PM

## 2022-10-20 NOTE — Progress Notes (Signed)
[facility] Progress Note     Subjective: Worsening RUE cellulitis. More tachycardic today.   Objective: Vital signs in last 24 hours: Temp:  [97.4 F (36.3 C)-98.6 F (37 C)] 97.4 F (36.3 C) (07/03 0748) Pulse Rate:  [99-126] 126 (07/03 0748) Resp:  [13-20] 17 (07/03 0748) BP: (84-111)/(62-93) 111/93 (07/03 0748) SpO2:  [90 %-96 %] 90 % (07/03 0748) Weight:  [76 kg] 76 kg (07/03 0324) Last BM Date : 10/18/22  Intake/Output from previous day: 07/02 0701 - 07/03 0700 In: 360 [P.O.:360] Out: 100 [Urine:100] Intake/Output this shift: No intake/output data recorded.  PE: Gen:  Alert, elderly female, NAD HEENT: L peri-orbital ecchymosis  Card:  Regular rate and rhythm Pulm:  Normal effort on [place] Abd: Soft, non-tender, non-distended, left hemi-abdomen with no visible hematoma but palpable firmness/fullness in left hemi-abdomen  Ext: RUE with significant edema and erythema  Skin: warm and dry, no rashes  Psych: A&Ox3   Lab Results:  Recent Labs    10/19/22 0719 10/20/22 0246  WBC 14.9* 12.3*  HGB 10.2* 9.9*  HCT 31.4* 31.0*  PLT 155 145*    BMET Recent Labs    10/19/22 0411 10/20/22 0246  NA 126* 127*  K 3.9 3.6  CL 92* 94*  CO2 21* 22  GLUCOSE 99 100*  BUN 21 20  CREATININE 1.10* 1.16*  CALCIUM 8.3* 8.5*    PT/INR No results for input(s): "LABPROT", "INR" in the last 72 hours.  CMP     Component Value Date/Time   NA 127 (L) 10/20/2022 0246   NA 136 (A) 07/02/2015 0000   K 3.6 10/20/2022 0246   CL 94 (L) 10/20/2022 0246   CO2 22 10/20/2022 0246   GLUCOSE 100 (H) 10/20/2022 0246   BUN 20 10/20/2022 0246   BUN 16 07/02/2015 0000   CREATININE 1.16 (H) 10/20/2022 0246   CREATININE 1.27 (H) 08/27/2022 1609   CALCIUM 8.5 (L) 10/20/2022 0246   PROT 6.3 (L) 10/13/2022 1611   ALBUMIN 3.2 (L) 10/13/2022 1611   AST 21 10/13/2022 1611   ALT 15 10/13/2022 1611   ALKPHOS 86 10/13/2022 1611   BILITOT 1.1 10/13/2022 1611   GFRNONAA 43 (L)  10/20/2022 0246   GFRAA >60 01/10/2017 1248   Lipase  No results found for: "LIPASE"     Studies/Results: VAS [place] UPPER EXTREMITY VENOUS DUPLEX  Result Date: 10/19/2022 UPPER VENOUS STUDY  Patient Name:  Diana [name]  Date of Exam:   10/18/2022 Medical Rec #: [id number]         Accession #:    [id number] Date of Birth: Mar 08, 1924         Patient Gender: F Patient Age:   89 years Exam Location:  [facility] Procedure:      VAS [place] UPPER EXTREMITY VENOUS DUPLEX Referring Phys: Trixie Deis --------------------------------------------------------------------------------  Indications: Pain, and Edema Risk Factors: Status post fall with multi trauma. History of breast cancer, s/p mastectomy. History of recurrent mastitis. Limitations: Pain with touch, significant edema throughout upper extremity. Comparison Study: No prior study Performing Technologist: Sherren Kerns RVS  Examination Guidelines: A complete evaluation includes B-mode imaging, spectral Doppler, color Doppler, and power Doppler as needed of all accessible portions of each vessel. Bilateral testing is considered an integral part of a complete examination. Limited examinations for reoccurring indications may be performed as noted.  Right Findings: +----------+------------+---------+-----------+----------+---------------+ RIGHT     CompressiblePhasicitySpontaneousProperties    Summary     +----------+------------+---------+-----------+----------+---------------+ IJV  Full       Yes       Yes                              +----------+------------+---------+-----------+----------+---------------+ Subclavian    Full       Yes       Yes                              +----------+------------+---------+-----------+----------+---------------+ Axillary                 Yes       Yes                              +----------+------------+---------+-----------+----------+---------------+ Brachial                 Yes        Yes                              +----------+------------+---------+-----------+----------+---------------+ Radial                                              patent by color +----------+------------+---------+-----------+----------+---------------+ Ulnar                                               patent by color +----------+------------+---------+-----------+----------+---------------+ Cephalic      Full       Yes       Yes                              +----------+------------+---------+-----------+----------+---------------+ Basilic                  Yes       Yes                              +----------+------------+---------+-----------+----------+---------------+  Left Findings: +----------+------------+---------+-----------+----------+-------+ LEFT      CompressiblePhasicitySpontaneousPropertiesSummary +----------+------------+---------+-----------+----------+-------+ Subclavian               Yes       Yes                      +----------+------------+---------+-----------+----------+-------+  Summary:  Right: No evidence of deep vein thrombosis in the upper extremity. No evidence of superficial vein thrombosis in the upper extremity.  Left: No evidence of thrombosis in the subclavian.  *See table(s) above for measurements and observations.  Diagnosing physician: Waverly Ferrari MD Electronically signed by Waverly Ferrari MD on 10/19/2022 at 8:31:10 AM.    Final     Anti-infectives: Anti-infectives (From admission, onward)    Start     Dose/Rate Route Frequency Ordered Stop   10/20/22 1200  ceFEPIme (MAXIPIME) 2 g in sodium chloride 0.9 % 100 mL IVPB  Status:  Discontinued        2 g 200 mL/hr over 30 Minutes Intravenous Every 24 hours 10/20/22 1020 10/20/22 1021   10/20/22  1115  ceFEPIme (MAXIPIME) 2 g in sodium chloride 0.9 % 100 mL IVPB        2 g 200 mL/hr over 30 Minutes Intravenous Daily 10/20/22 1022     10/20/22 1030  doxycycline  (VIBRA-TABS) tablet 100 mg        100 mg Oral Every 12 hours 10/20/22 0931     10/18/22 1215  doxycycline (VIBRA-TABS) tablet 100 mg  Status:  Discontinued        100 mg Oral Every 12 hours 10/18/22 1123 10/19/22 1049        Assessment/Plan  89yoF s/p fall Left 2-5 rib fractures - IS 10x/hr while awake; multidmodal pain control, wean O2 as able Rectus sheath hematoma - monitor abdominal exam, no extrav on CT; hgb stable at 10.2 Left ear lac - repaired by EDP with suture Left forehead lac - repaired by EDP with dermabond Chronic constipation - based on CT, large rectal stool burden. Bowel regimen, Miralax BID and senna BID  RUE cellulitis - WBC downtrending but erythema and edema seems worse despite IV abx, [place] negative for DVT   Afib - hold anticoagulation, needs better rate control  29 mm suprasellar mass with intrasellar component - ?pituitary macroadenoma vs planum sphenoidale meningioma, surgical intervention not appropriate, NS consult deferred    PPX: SCDs; prophylacitc lovenox started 6/29 FEN: soft diet, SLIV ID: PO doxy, IV cefepime  Dispo - 4NP, has authorization for SNF but not medically ready today with worsening cellulitis and tachycardia. TRH taking over as primary but we will continue to help in any way we can   LOS: 7 days   I reviewed hospitalist notes, last 24 h vitals and pain scores, last 48 h intake and output, and last 24 h labs and trends.  Juliet Rude, [facility] 10/20/2022, 10:55 AM Please see Amion for pager number during day hours 7:00am-4:30pm

## 2022-10-21 DIAGNOSIS — L03113 Cellulitis of right upper limb: Secondary | ICD-10-CM | POA: Diagnosis not present

## 2022-10-21 DIAGNOSIS — E871 Hypo-osmolality and hyponatremia: Secondary | ICD-10-CM | POA: Diagnosis not present

## 2022-10-21 DIAGNOSIS — D539 Nutritional anemia, unspecified: Secondary | ICD-10-CM | POA: Diagnosis not present

## 2022-10-21 DIAGNOSIS — I4891 Unspecified atrial fibrillation: Secondary | ICD-10-CM | POA: Diagnosis not present

## 2022-10-21 LAB — SODIUM, URINE, RANDOM: Sodium, Ur: <10 mmol/L

## 2022-10-21 LAB — COMPREHENSIVE METABOLIC PANEL
ALT: 21 U/L (ref 0–44)
AST: 21 U/L (ref 15–41)
Albumin: 2.9 g/dL — ABNORMAL LOW (ref 3.5–5.0)
Alkaline Phosphatase: 102 U/L (ref 38–126)
Anion gap: 8 (ref 5–15)
BUN: 21 mg/dL (ref 8–23)
CO2: 24 mmol/L (ref 22–32)
Calcium: 8.5 mg/dL — ABNORMAL LOW (ref 8.9–10.3)
Chloride: 96 mmol/L — ABNORMAL LOW (ref 98–111)
Creatinine, Ser: 1.15 mg/dL — ABNORMAL HIGH (ref 0.44–1.00)
GFR, Estimated: 44 mL/min — ABNORMAL LOW (ref >60)
Glucose, Bld: 85 mg/dL (ref 70–99)
Potassium: 3.9 mmol/L (ref 3.5–5.1)
Sodium: 128 mmol/L — ABNORMAL LOW (ref 135–145)
Total Bilirubin: 1.7 mg/dL — ABNORMAL HIGH (ref 0.3–1.2)
Total Protein: 6.3 g/dL — ABNORMAL LOW (ref 6.5–8.1)

## 2022-10-21 LAB — CBC
HCT: 31.6 % — ABNORMAL LOW (ref 36.0–46.0)
Hemoglobin: 10.2 g/dL — ABNORMAL LOW (ref 12.0–15.0)
MCH: 33.8 pg (ref 26.0–34.0)
MCHC: 32.3 g/dL (ref 30.0–36.0)
MCV: 104.6 fL — ABNORMAL HIGH (ref 80.0–100.0)
Platelets: 162 10*3/uL (ref 150–400)
RBC: 3.02 MIL/uL — ABNORMAL LOW (ref 3.87–5.11)
RDW: 18.6 % — ABNORMAL HIGH (ref 11.5–15.5)
WBC: 9.2 10*3/uL (ref 4.0–10.5)
nRBC: 0 % (ref 0.0–0.2)

## 2022-10-21 LAB — OSMOLALITY, URINE: Osmolality, Ur: 894 mOsm/kg (ref 300–900)

## 2022-10-21 MED ORDER — POTASSIUM CHLORIDE CRYS ER 20 MEQ PO TBCR
40.0000 meq | EXTENDED_RELEASE_TABLET | Freq: Once | ORAL | Status: AC
  Administered 2022-10-21: 40 meq via ORAL
  Filled 2022-10-21: qty 2

## 2022-10-21 NOTE — Progress Notes (Signed)
PROGRESS NOTE    Diana KIESOW  [name]:[id number] DOB: [birth year] DOA: 10/13/2022 PCP: Excell Seltzer, MD    Brief Narrative:  Ms Diana [name] is a 89 year old female with past medical history of obstructive sleep apnea, hypertension, hypothyroidism, COPD presented to the hospital after mechanical fall.  Workup in the emergency department revealed rib fractures as well as scalp hematoma.  Patient was then admitted to the trauma service for further workup.  Medicine team was consulted for medication management.  Subsequently there was concern for right upper extremity cellulitis as well as worsening tachycardia.  Patient was transferred to the medical service.  Assessment and plan:  Cellulitis involving right upper extremity Patient with longstanding lymphedema due to previous surgery for breast cancer.  According to patient's son she periodically gets infection in the arm.  Right arm was noted to be quite swollen and erythematous.  Doppler studies were negative for DVT.  WBC was noted to be elevated.   Due to previous isolation of Pseudomonas from lower extremity wound she was placed on cefepime and also given doxycycline.  No significant improvement noted in the right arm today.  WBC is better however.  She is afebrile. Continue to wait for further clinical improvement.  Keep arm elevated.  Atrial fibrillation with RVR Heart rate has been poorly controlled.  Probably because she did not receive metoprolol dose for 24 hours.  Resumed yesterday morning.  Improvement noted in heart rate though heart rate is somewhat elevated this morning.  May need to increase the dose of her beta-blocker but will first see how she does over the next 24 hours.  Monitor blood pressures closely.   Was on apixaban prior to admission.  However considering her fall and multiple injuries as a result of the fall trauma service does not recommend resuming anticoagulation.  Hyponatremia Started on salt tablets by trauma  service.  Urine osmolality noted to be 894 with urine sodium less than 10.  Likely has element of SIADH.  Sodium levels stable for the most part.  Will recheck tomorrow.    History of chronic systolic heart failure Review of previous TTE in May showed EF 25%.   Volume status is stable for the most part.  Diuretics on hold.   Hypothyroidism Continue Synthroid   Insomnia Continue trazodone   Hypokalemia Monitor closely.  Supplement as needed.  Macrocytic anemia Stable hemoglobin noted.  Chronic constipation Continue bowel regimen.   Fall with Left scalp hematoma, Left-sided rib fractures Trauma services was following.  Incentive spirometry.  Rectus sheath hematoma.   Conservative management.  Hemoglobin has been stable.  Left ear laceration Repaired by ED provider with suture.  Left forehead laceration Repaired by ED provider with Dermabond.  Coronary artery disease  Stable.  Continue aspirin, Lipitor, metoprolol. No active chest pain.   Concern for cranial aneurysm Incidental finding noted on  initial CT head scan.  Radiology  recommended  CTA head and neck to rule out ACOM aneurysm.  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  Stenosis of the left vertebral artery.   Discussed this finding with the patient's son at bedside.  Will need follow-up with neurosurgery as outpatient.  Spoke with Dr. Johnsie Cancel in neurosurgery who recommended outpatient follow-up.  Patient unlikely to be a surgical candidate due to advanced age with frailty and family wishes.   Hyperlipidemia Continue Lipitor     DVT prophylaxis: Lovenox  Code Status: DNR Family communication: No family at bedside today.  Will update son later today. Disposition: SNF when Medically stable.   Procedures:  None  Antimicrobials:  None  Anti-infectives (From admission, onward)    Start      Dose/Rate Route Frequency Ordered Stop   10/20/22 1200  ceFEPIme (MAXIPIME) 2 g in sodium chloride 0.9 % 100 mL IVPB  Status:  Discontinued        2 g 200 mL/hr over 30 Minutes Intravenous Every 24 hours 10/20/22 1020 10/20/22 1021   10/20/22 1115  ceFEPIme (MAXIPIME) 2 g in sodium chloride 0.9 % 100 mL IVPB        2 g 200 mL/hr over 30 Minutes Intravenous Daily 10/20/22 1022     10/20/22 1030  doxycycline (VIBRA-TABS) tablet 100 mg        100 mg Oral Every 12 hours 10/20/22 0931     10/18/22 1215  doxycycline (VIBRA-TABS) tablet 100 mg  Status:  Discontinued        100 mg Oral Every 12 hours 10/18/22 1123 10/19/22 1049       Subjective: Patient sitting on the bed eating her breakfast.  Arm is not better.  Denies any chest pain shortness of breath.  No nausea or vomiting.  Objective: Vitals:   10/20/22 2300 10/21/22 0254 10/21/22 0613 10/21/22 0753  BP: 102/64 100/73  101/70  Pulse: 91 97 (!) 104 (!) 107  Resp: 10 10 17 15   Temp: 97.9 F (36.6 C)   97.6 F (36.4 C)  TempSrc: Oral   Oral  SpO2: 92% 93% 97% 98%  Weight:      Height:        Intake/Output Summary (Last 24 hours) at 10/21/2022 1011 Last data filed at 10/21/2022 0609 Gross per 24 hour  Intake 580 ml  Output 325 ml  Net 255 ml    Filed Weights   10/18/22 0500 10/19/22 0500 10/20/22 0324  Weight: 74.4 kg 73.2 kg 76 kg    Physical Examination:  General appearance: Awake alert.  In no distress.  Hard of hearing Resp: Clear to auscultation bilaterally.  Normal effort Cardio: S1-S2 is irregularly irregular GI: Abdomen is soft.  Nontender nondistended.  Bowel sounds are present normal.  No masses organomegaly Extremities: Swelling of the right arm is noted with erythema.  Tender.  Less warm to touch today.  Radial pulses palpable. Neurologic: No focal neurological deficits.   Data Reviewed:   CBC: Recent Labs  Lab 10/17/22 0305 10/18/22 0356 10/19/22 0719 10/20/22 0246 10/21/22 0600  WBC 9.4 16.2*  14.9* 12.3* 9.2  HGB 10.1* 9.8* 10.2* 9.9* 10.2*  HCT 31.0* 30.0* 31.4* 31.0* 31.6*  MCV 102.0* 104.2* 105.4* 103.7* 104.6*  PLT 163 144* 155 145* 162     Basic Metabolic Panel: Recent Labs  Lab 10/15/22 0435 10/16/22 0316 10/17/22 0305 10/18/22 0356 10/19/22 0411 10/20/22 0246 10/21/22 0600  NA 132* 129* 128* 128* 126* 127* 128*  K 3.7 3.9 5.3* 4.2 3.9 3.6 3.9  CL 98 96* 95* 93* 92* 94* 96*  CO2 24 22 21* 25 21* 22 24  GLUCOSE 85 107* 93 90 99 100* 85  BUN 18 19 20 19 21 20 21   CREATININE 1.04* 1.09* 1.17* 1.19* 1.10* 1.16* 1.15*  CALCIUM 8.6* 8.6* 8.4* 8.4* 8.3* 8.5* 8.5*  MG 2.2 2.2  --  2.2  --  2.2  --      Liver Function Tests: Recent Labs  Lab 10/21/22 0600  AST 21  ALT 21  ALKPHOS 102  BILITOT 1.7*  PROT 6.3*  ALBUMIN 2.9*      Radiology Studies: No results found.    LOS: 8 days   Osvaldo Shipper, MD [facility] Available via Epic secure chat 7am-7pm After these hours, please refer to coverage provider listed on [email or web address] 10/21/2022, 10:11 AM

## 2022-10-21 NOTE — TOC Progression Note (Signed)
Transition of Care [facility]) - Progression Note    Patient Details  Name: Diana [name] MRN: [id number] Date of Birth: November 16, 1923  Transition of Care [facility]) CM/SW Contact  Astrid Drafts Berna Spare, RN Phone Number: 10/21/2022, 10:56 AM  Clinical Narrative:    Patient awaiting medical stability and insurance auth for SNF placement at [facility].  TOC will continue to follow.    Expected Discharge Plan: Skilled Nursing Facility Barriers to Discharge: Continued Medical Work up  Expected Discharge Plan and Services   Discharge Planning Services: CM Consult Post Acute Care Choice: Skilled Nursing Facility                             HH Arranged: PT, OT, Speech Therapy HH Agency: [facility] Date [facility] Agency Contacted: 10/14/22 Time HH Agency Contacted: 1707 Representative spoke with at [facility] Agency: Angie   Social Determinants of Health (SDOH) Interventions SDOH Screenings   Food Insecurity: No Food Insecurity (09/03/2022)  Housing: Low Risk  (09/03/2022)  Transportation Needs: No Transportation Needs (09/03/2022)  Utilities: Not At Risk (09/03/2022)  Alcohol Screen: Low Risk  (06/10/2022)  Depression (PHQ2-9): Low Risk  (06/10/2022)  Financial Resource Strain: Low Risk  (06/10/2022)  Physical Activity: Inactive (06/10/2022)  Social Connections: Socially Isolated (06/10/2022)  Stress: No Stress Concern Present (06/10/2022)  Tobacco Use: Medium Risk (10/13/2022)    Readmission Risk Interventions     No data to display         Quintella Baton, RN, BSN  Trauma/Neuro ICU Case Manager [phone]

## 2022-10-21 NOTE — Progress Notes (Signed)
[facility] Progress Note     Subjective: Stable RUE cellulitis.  Objective: Vital signs in last 24 hours: Temp:  [97.5 F (36.4 C)-97.9 F (36.6 C)] 97.6 F (36.4 C) (07/04 0753) Pulse Rate:  [91-116] 107 (07/04 0753) Resp:  [10-17] 15 (07/04 0753) BP: (91-111)/(37-74) 101/70 (07/04 0753) SpO2:  [92 %-98 %] 98 % (07/04 0753) Last BM Date : 10/20/22  Intake/Output from previous day: 07/03 0701 - 07/04 0700 In: 700 [P.O.:600; IV Piggyback:100] Out: 325 [Urine:325] Intake/Output this shift: No intake/output data recorded.  PE: Gen:  Alert, elderly female, NAD HEENT: L peri-orbital ecchymosis  Card:  rrr Pulm:  Normal effort on [place] Abd: Soft, non-tender, non-distended, left hemi-abdomen with no visible hematoma but palpable firmness/fullness in left hemi-abdomen  Ext: RUE with edema and erythema  Skin: warm and dry, no rashes  Psych: A&Ox3   Lab Results:  Recent Labs    10/20/22 0246 10/21/22 0600  WBC 12.3* 9.2  HGB 9.9* 10.2*  HCT 31.0* 31.6*  PLT 145* 162   BMET Recent Labs    10/20/22 0246 10/21/22 0600  NA 127* 128*  K 3.6 3.9  CL 94* 96*  CO2 22 24  GLUCOSE 100* 85  BUN 20 21  CREATININE 1.16* 1.15*  CALCIUM 8.5* 8.5*   PT/INR No results for input(s): "LABPROT", "INR" in the last 72 hours.  CMP     Component Value Date/Time   NA 128 (L) 10/21/2022 0600   NA 136 (A) 07/02/2015 0000   K 3.9 10/21/2022 0600   CL 96 (L) 10/21/2022 0600   CO2 24 10/21/2022 0600   GLUCOSE 85 10/21/2022 0600   BUN 21 10/21/2022 0600   BUN 16 07/02/2015 0000   CREATININE 1.15 (H) 10/21/2022 0600   CREATININE 1.27 (H) 08/27/2022 1609   CALCIUM 8.5 (L) 10/21/2022 0600   PROT 6.3 (L) 10/21/2022 0600   ALBUMIN 2.9 (L) 10/21/2022 0600   AST 21 10/21/2022 0600   ALT 21 10/21/2022 0600   ALKPHOS 102 10/21/2022 0600   BILITOT 1.7 (H) 10/21/2022 0600   GFRNONAA 44 (L) 10/21/2022 0600   GFRAA >60 01/10/2017 1248   Lipase  No results found for:  "LIPASE"     Studies/Results: No results found.  Anti-infectives: Anti-infectives (From admission, onward)    Start     Dose/Rate Route Frequency Ordered Stop   10/20/22 1200  ceFEPIme (MAXIPIME) 2 g in sodium chloride 0.9 % 100 mL IVPB  Status:  Discontinued        2 g 200 mL/hr over 30 Minutes Intravenous Every 24 hours 10/20/22 1020 10/20/22 1021   10/20/22 1115  ceFEPIme (MAXIPIME) 2 g in sodium chloride 0.9 % 100 mL IVPB        2 g 200 mL/hr over 30 Minutes Intravenous Daily 10/20/22 1022     10/20/22 1030  doxycycline (VIBRA-TABS) tablet 100 mg        100 mg Oral Every 12 hours 10/20/22 0931     10/18/22 1215  doxycycline (VIBRA-TABS) tablet 100 mg  Status:  Discontinued        100 mg Oral Every 12 hours 10/18/22 1123 10/19/22 1049        Assessment/Plan  89yoF s/p fall Left 2-5 rib fractures - IS 10x/hr while awake; multidmodal pain control, wean O2 as able Rectus sheath hematoma - monitor abdominal exam, no extrav on CT; hgb stable at 10.2 Left ear lac - repaired by EDP with suture Left forehead lac -  repaired by EDP with dermabond Chronic constipation - based on CT, large rectal stool burden. Bowel regimen, Miralax BID and senna BID  RUE cellulitis - WBC now normal on IV abx. Remote hx of mastectomy R side and known issues in past with lymphedema R arm. No IV right side. [place] negative for DVT   Afib - hold anticoagulation, needs better rate control  29 mm suprasellar mass with intrasellar component - ?pituitary macroadenoma vs planum sphenoidale meningioma, surgical intervention not appropriate, NS consult deferred    PPX: SCDs; prophylacitc lovenox started 6/29 FEN: soft diet, SLIV ID: PO doxy, IV cefepime  Dispo - 4NP, has authorization for SNF. Now being treated for RUE cellulitis with TRH.  TRH taking over as primary we will remain available to assist in her care moving forward - please let us know if questions or concerns arise.  I spent a total of 35 minutes  in both face-to-face and non-face-to-face activities, excluding procedures performed, for this visit on the date of this encounter.   LOS: 8 days   I reviewed hospitalist notes, last 24 h vitals and pain scores, last 48 h intake and output, and last 24 h labs and trends.  Marin Olp, MD [facility], A [facility]

## 2022-10-22 DIAGNOSIS — I482 Chronic atrial fibrillation, unspecified: Secondary | ICD-10-CM

## 2022-10-22 DIAGNOSIS — L03113 Cellulitis of right upper limb: Secondary | ICD-10-CM | POA: Diagnosis not present

## 2022-10-22 DIAGNOSIS — E871 Hypo-osmolality and hyponatremia: Secondary | ICD-10-CM | POA: Diagnosis not present

## 2022-10-22 DIAGNOSIS — I5022 Chronic systolic (congestive) heart failure: Secondary | ICD-10-CM | POA: Diagnosis not present

## 2022-10-22 NOTE — Progress Notes (Signed)
Physical Therapy Treatment Patient Details Name: Diana [name] MRN: [id number] DOB: 1923/11/04 Today's Date: 10/22/2022   History of Present Illness Pt is a 89 year old admitted 10/13/22 after mechanical fall. Dx with rib fractures L 2-5, scalp laceration and hematoma and small R PE. PMH includes chronic hearing loss, progressive bilateral lower extremity weakness, atrial fibrillation, essential hypertension -recently discharged from [facility] 09/06/2022 with cellulitis of the lower extremity.  She was discharged to a rehab facility where she was for a few weeks.  She subsequently was transferred to ALF 6/26 where she fell in the bathroom.    PT Comments  Progressed patient towards goals per plan of care. Today's session patient presented with increased fatigue and generalized LE weakness. Able to transfer to EOB with physical assistance for scooting; verbal cues needed for hand placement. Pt able to stand from EOB but required multimodal cues for power up. Able to maintain balance with RW support but unsteady when performing single leg stance on the right. Pt able to transfer to recliner from bed with step pivot transfer before reporting of fatigue. Based on today's performance recommending skilled rehab < 3 hours a day in order to improve functional mobility and reduce risk of falls.    Assistance Recommended at Discharge Frequent or constant Supervision/Assistance  If plan is discharge home, recommend the following:  Can travel by private vehicle    A little help with walking and/or transfers;A little help with bathing/dressing/bathroom;Assistance with cooking/housework;Assist for transportation;Direct supervision/assist for medications management   No  Equipment Recommendations  None recommended by PT    Recommendations for Other Services       Precautions / Restrictions Precautions Precautions: Fall Restrictions Weight Bearing Restrictions: No     Mobility  Bed Mobility Overal bed  mobility: Needs Assistance Bed Mobility: Supine to Sit     Supine to sit: Min assist, HOB elevated     General bed mobility comments: pt able to move LE to EOB without physical assistance but required minA in order to scoot to the EOB with pad. Multimodal cues to reach to right bedrail with LUE and scooting trunk to EOB.    Transfers Overall transfer level: Needs assistance Equipment used: Rolling walker (2 wheels) Transfers: Sit to/from Stand, Bed to chair/wheelchair/BSC Sit to Stand: Min assist   Step pivot transfers: Max assist       General transfer comment: Heavy cueing for hand placement and forward trunk lean before powerup. B hands on walker; cued pt to push off bed with minA to power up. Performed step pivot transfer to chair required maxA towards end of transfer.    Ambulation/Gait               General Gait Details: Deferred this session due to muscle weakness and imbalance   Stairs             Wheelchair Mobility     Tilt Bed    Modified Rankin (Stroke Patients Only)       Balance Overall balance assessment: Needs assistance Sitting-balance support: Single extremity supported Sitting balance-Leahy Scale: Poor Sitting balance - Comments: Patient sat EOB with single UE Support   Standing balance support: Bilateral upper extremity supported, During functional activity, Reliant on assistive device for balance Standing balance-Leahy Scale: Poor Standing balance comment: heavy reliance on RW with BUE support                            Cognition  Arousal/Alertness: Awake/alert Behavior During Therapy: WFL for tasks assessed/performed Overall Cognitive Status: Impaired/Different from baseline Area of Impairment: Safety/judgement, Problem solving, Attention                   Current Attention Level: Focused     Safety/Judgement: Decreased awareness of safety, Decreased awareness of deficits   Problem Solving: Slow  processing, Decreased initiation, Difficulty sequencing, Requires verbal cues, Requires tactile cues          Exercises      General Comments General comments (skin integrity, edema, etc.): Swelling and erythema noted in RUE. SpO2 readings inaccurate due to grip on RW; no reports of SOB and > 90% in recliner.      Pertinent Vitals/Pain Pain Assessment Pain Assessment: Faces Faces Pain Scale: Hurts whole lot Pain Location: Bilateral Arms (R > L) Pain Descriptors / Indicators: Discomfort, Aching, Sore Pain Intervention(s): Limited activity within patient's tolerance, Monitored during session    Home Living                          Prior Function            PT Goals (current goals can now be found in the care plan section) Acute Rehab PT Goals Patient Stated Goal: to return to ALF PT Goal Formulation: With patient/family Time For Goal Achievement: 10/28/22 Potential to Achieve Goals: Good Progress towards PT goals: Progressing toward goals    Frequency    Min 3X/week      PT Plan Current plan remains appropriate    Co-evaluation              AM-PAC PT "6 Clicks" Mobility   Outcome Measure  Help needed turning from your back to your side while in a flat bed without using bedrails?: A Little Help needed moving from lying on your back to sitting on the side of a flat bed without using bedrails?: A Little Help needed moving to and from a bed to a chair (including a wheelchair)?: A Lot Help needed standing up from a chair using your arms (e.g., wheelchair or bedside chair)?: A Lot Help needed to walk in hospital room?: Total Help needed climbing 3-5 steps with a railing? : Total 6 Click Score: 12    End of Session Equipment Utilized During Treatment: Gait belt Activity Tolerance: Patient tolerated treatment well Patient left: in chair;with call bell/phone within reach;with chair alarm set Nurse Communication: Mobility status PT Visit Diagnosis:  Other abnormalities of gait and mobility (R26.89);History of falling (Z91.81)     Time: 1201-1228 PT Time Calculation (min) (ACUTE ONLY): 27 min  Charges:    $Therapeutic Activity: 23-37 mins PT General Charges $$ ACUTE PT VISIT: 1 Visit                     Christene Lye, SPT Acute Rehabilitation Services [phone] Secure chat preferred     Christene Lye 10/22/2022, 1:21 PM

## 2022-10-22 NOTE — Progress Notes (Signed)
PROGRESS NOTE    Diana [name]  NWG:[id number] DOB: 1924/02/27 DOA: 10/13/2022 PCP: Excell Seltzer, MD    Brief Narrative:  Diana [name] is a 89 year old female with past medical history of obstructive sleep apnea, hypertension, hypothyroidism, COPD presented to the hospital after mechanical fall.  Workup in the emergency department revealed rib fractures as well as scalp hematoma.  Patient was then admitted to the trauma service for further workup.  Medicine team was consulted for medication management.  Subsequently there was concern for right upper extremity cellulitis as well as worsening tachycardia.  Patient was transferred to the medical service.  Assessment and plan:  Cellulitis involving right upper extremity Patient with longstanding lymphedema due to previous surgery for breast cancer.  According to patient's son she periodically gets infection in the arm.  Right arm was noted to be quite swollen and erythematous.  Doppler studies were negative for DVT.  WBC was noted to be elevated.   Due to previous isolation of Pseudomonas from lower extremity wound she was placed on cefepime and also given doxycycline.   Arm seems to be improving.  Less erythematous today compared to yesterday.  Swelling seems to be improving as well.  Continue to monitor.  Remains afebrile.  Patient to oral antibiotics in 24 to 48 hours.  Atrial fibrillation with RVR Heart rate has been poorly controlled.  Likely multifactorial including infection pain issues.  Heart rate seems to be better controlled on the last 24 hours. Continue with metoprolol. Monitor blood pressures closely.   Was on apixaban prior to admission.  However considering her fall and multiple injuries as a result of the fall trauma service does not recommend resuming anticoagulation.  Hyponatremia Started on salt tablets by trauma service.  Urine osmolality noted to be 894 with urine sodium less than 10.  Likely has element of SIADH.   Room  levels have been stable.  Labs are pending from today.     History of chronic systolic heart failure Review of previous TTE in May showed EF 25%.   Volume status is stable for the most part.  Diuretics on hold. Should be able to resume at discharge.   Hypothyroidism Continue Synthroid   Insomnia Continue trazodone   Hypokalemia Monitor closely.  Supplement as needed.  Macrocytic anemia Hemoglobin is stable.  Chronic constipation Continue bowel regimen.   Fall with Left scalp hematoma, Left-sided rib fractures Trauma services was following.  Incentive spirometry.  Rectus sheath hematoma.   Conservative management.  Hemoglobin has been stable.  Left ear laceration Repaired by ED provider with suture.  Left forehead laceration Repaired by ED provider with Dermabond.  Coronary artery disease  Stable.  Continue aspirin, Lipitor, metoprolol. No active chest pain.   Concern for cranial aneurysm Incidental finding noted on  initial CT head scan.  Radiology  recommended  CTA head and neck to rule out ACOM aneurysm.  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  Stenosis of the left vertebral artery.   Discussed this finding with the patient's son at bedside.  Will need follow-up with neurosurgery as outpatient.  Spoke with Dr. Johnsie Cancel in neurosurgery who recommended outpatient follow-up.  Patient unlikely to be a surgical candidate due to advanced age with frailty and family wishes.   Hyperlipidemia Continue Lipitor   DVT prophylaxis: Lovenox Code Status: DNR Family communication: No family at bedside.  Will update son. Disposition:  SNF when Medically stable.   Procedures:  None  Antimicrobials:  None  Anti-infectives (From admission, onward)    Start     Dose/Rate Route Frequency Ordered Stop   10/20/22 1200  ceFEPIme (MAXIPIME) 2 g in sodium chloride 0.9  % 100 mL IVPB  Status:  Discontinued        2 g 200 mL/hr over 30 Minutes Intravenous Every 24 hours 10/20/22 1020 10/20/22 1021   10/20/22 1115  ceFEPIme (MAXIPIME) 2 g in sodium chloride 0.9 % 100 mL IVPB        2 g 200 mL/hr over 30 Minutes Intravenous Daily 10/20/22 1022     10/20/22 1030  doxycycline (VIBRA-TABS) tablet 100 mg        100 mg Oral Every 12 hours 10/20/22 0931     10/18/22 1215  doxycycline (VIBRA-TABS) tablet 100 mg  Status:  Discontinued        100 mg Oral Every 12 hours 10/18/22 1123 10/19/22 1049       Subjective: Very hard of hearing.  Denies any chest pain shortness of breath.  Continues to have pain in the right arm.  Objective: Vitals:   10/21/22 1936 10/21/22 2300 10/22/22 0311 10/22/22 0744  BP: (!) 119/94 112/87 (!) 115/92 97/81  Pulse: (!) 104 (!) 105 98 97  Resp: 13 17 13 19   Temp: 97.6 F (36.4 C) 98.4 F (36.9 C) 97.7 F (36.5 C) (!) 97.5 F (36.4 C)  TempSrc: Oral Axillary Oral Oral  SpO2: 100% 96% 94% 98%  Weight:   75.6 kg   Height:        Intake/Output Summary (Last 24 hours) at 10/22/2022 0900 Last data filed at 10/21/2022 2300 Gross per 24 hour  Intake 360 ml  Output 900 ml  Net -540 ml    Filed Weights   10/20/22 0324 10/21/22 0713 10/22/22 0311  Weight: 76 kg 76.2 kg 75.6 kg    Physical Examination:   General appearance: Awake alert.  In no distress Resp: Clear to auscultation bilaterally.  Normal effort Cardio: S1-S2 is irregularly irregular GI: Abdomen is soft.  Nontender nondistended.  Bowel sounds are present normal.  No masses organomegaly Extremities: Some improvement noted in the swelling of the right arm.  Improvement in erythema also noted.  Less tender. Neurologic: No obvious focal neurological deficits.   Data Reviewed:   CBC: Recent Labs  Lab 10/17/22 0305 10/18/22 0356 10/19/22 0719 10/20/22 0246 10/21/22 0600  WBC 9.4 16.2* 14.9* 12.3* 9.2  HGB 10.1* 9.8* 10.2* 9.9* 10.2*  HCT 31.0* 30.0* 31.4*  31.0* 31.6*  MCV 102.0* 104.2* 105.4* 103.7* 104.6*  PLT 163 144* 155 145* 162     Basic Metabolic Panel: Recent Labs  Lab 10/16/22 0316 10/17/22 0305 10/18/22 0356 10/19/22 0411 10/20/22 0246 10/21/22 0600  NA 129* 128* 128* 126* 127* 128*  K 3.9 5.3* 4.2 3.9 3.6 3.9  CL 96* 95* 93* 92* 94* 96*  CO2 22 21* 25 21* 22 24  GLUCOSE 107* 93 90 99 100* 85  BUN 19 20 19 21 20 21   CREATININE 1.09* 1.17* 1.19* 1.10* 1.16* 1.15*  CALCIUM 8.6* 8.4* 8.4* 8.3* 8.5* 8.5*  MG 2.2  --  2.2  --  2.2  --      Liver Function Tests: Recent Labs  Lab 10/21/22 0600  AST 21  ALT 21  ALKPHOS 102  BILITOT 1.7*  PROT 6.3*  ALBUMIN 2.9*      Radiology Studies: No results  found.    LOS: 9 days   Osvaldo Shipper, MD [facility] Available via Epic secure chat 7am-7pm After these hours, please refer to coverage provider listed on [email or web address] 10/22/2022, 9:00 AM

## 2022-10-22 NOTE — Progress Notes (Addendum)
[employer] offering a peer-to-peer review for SNF [facility]). Number for review is [phone], option #5. Peer-to-peer offer expires at 1pm today. MD updated.    1320: MD completed P2P, [employer] is requesting updated PT note by 1400 today. PT note complete and has been uploaded via [employer] portal. SW will provide updates as available.   Dellie Burns, MSW, LCSW [phone] (coverage)

## 2022-10-22 NOTE — Progress Notes (Signed)
Changed patient elbow dressing with [place]. Elbow was weeping a copious amount of tan purulent drainage that seemed to cause the patient pain as well. Arm is still swollen and reddened. Patient felt better after dressing change however there is still a lot of drainage coming out.  Mikki Harbor, RN

## 2022-10-23 ENCOUNTER — Inpatient Hospital Stay (HOSPITAL_COMMUNITY): Payer: Medicare Other

## 2022-10-23 DIAGNOSIS — I482 Chronic atrial fibrillation, unspecified: Secondary | ICD-10-CM | POA: Diagnosis not present

## 2022-10-23 DIAGNOSIS — I5022 Chronic systolic (congestive) heart failure: Secondary | ICD-10-CM | POA: Diagnosis not present

## 2022-10-23 DIAGNOSIS — L03113 Cellulitis of right upper limb: Secondary | ICD-10-CM | POA: Diagnosis not present

## 2022-10-23 DIAGNOSIS — E871 Hypo-osmolality and hyponatremia: Secondary | ICD-10-CM | POA: Diagnosis not present

## 2022-10-23 NOTE — TOC Progression Note (Signed)
Transition of Care [facility]) - Progression Note    Patient Details  Name: Diana [name] MRN: [id number] Date of Birth: May 10, 1923  Transition of Care [facility]) CM/SW Contact  Deatra Robinson, [place] Phone Number: 10/23/2022, 9:45 AM  Clinical Narrative:  Floreen Comber received for [facility]: [id number], [id number], valid 7/5-7/9. Auth details provided to [place] with [facility] who reports they are able to accept pt beginning Monday. MD updated. SW will follow.   Dellie Burns, MSW, LCSW [phone] (coverage)      Expected Discharge Plan: Skilled Nursing Facility Barriers to Discharge: Continued Medical Work up  Expected Discharge Plan and Services   Discharge Planning Services: CM Consult Post Acute Care Choice: Skilled Nursing Facility                             HH Arranged: PT, OT, Speech Therapy HH Agency: [facility] Date [facility] Agency Contacted: 10/14/22 Time HH Agency Contacted: 1707 Representative spoke with at [facility] Agency: Angie   Social Determinants of Health (SDOH) Interventions SDOH Screenings   Food Insecurity: No Food Insecurity (09/03/2022)  Housing: Low Risk  (09/03/2022)  Transportation Needs: No Transportation Needs (09/03/2022)  Utilities: Not At Risk (09/03/2022)  Alcohol Screen: Low Risk  (06/10/2022)  Depression (PHQ2-9): Low Risk  (06/10/2022)  Financial Resource Strain: Low Risk  (06/10/2022)  Physical Activity: Inactive (06/10/2022)  Social Connections: Socially Isolated (06/10/2022)  Stress: No Stress Concern Present (06/10/2022)  Tobacco Use: Medium Risk (10/13/2022)    Readmission Risk Interventions     No data to display

## 2022-10-23 NOTE — Progress Notes (Signed)
PROGRESS NOTE    Diana [name]  HYQ:[id number] DOB: 02-11-1924 DOA: 10/13/2022 PCP: Excell Seltzer, MD    Brief Narrative:  Diana [name] is a 89 year old female with past medical history of obstructive sleep apnea, hypertension, hypothyroidism, COPD presented to the hospital after mechanical fall.  Workup in the emergency department revealed rib fractures as well as scalp hematoma.  Patient was then admitted to the trauma service for further workup.  Medicine team was consulted for medication management.  Subsequently there was concern for right upper extremity cellulitis as well as worsening tachycardia.  Patient was transferred to the medical service.  Assessment and plan:  Cellulitis involving right upper extremity Patient with longstanding lymphedema due to previous surgery for breast cancer.  According to patient's son she periodically gets infection in the arm.  Right arm was noted to be quite swollen and erythematous.  Doppler studies were negative for DVT.  WBC was noted to be elevated.   Due to previous isolation of Pseudomonas from lower extremity wound she was placed on cefepime and also given doxycycline.   Erythema appears to be improving.  However swelling persists.  Overnight drainage was noted from the elbow area.  She is still quite tender in that area.  Will proceed with CT scan to rule out any abscess. Continue IV antibiotics for now.  She remains afebrile.  If CT scan does not show anything concerning we can change her over to oral antibiotics later today. Based on previous sensitivities we may need to consider ciprofloxacin when transition is made to oral agents.  Atrial fibrillation with RVR Patient with previous history of same.  Had RVR during the early part of this hospitalization likely due to all of her acute issues including infection.  Metoprolol dose was increased.  As infection was treated at her heart rate improved. Stable over the past 48 hours. Was on apixaban  prior to admission.  However considering her fall and multiple injuries as a result of the fall trauma service does not recommend resuming anticoagulation.  Hyponatremia Started on salt tablets by trauma service.  Urine osmolality noted to be 894 with urine sodium less than 10.  Likely has element of SIADH.   Sodium levels have been stable.  Labs could not be done yesterday since she was a poor stick.  Will try to reattempt tomorrow.  History of chronic systolic heart failure Review of previous TTE in May showed EF 25%.   Volume status is stable for the most part.  Diuretics on hold. Should be able to resume at discharge.   Hypothyroidism Continue Synthroid   Insomnia Continue trazodone   Hypokalemia Supplement as needed.  Macrocytic anemia Hemoglobin is stable.  Chronic constipation Continue bowel regimen.   Fall with Left scalp hematoma, Left-sided rib fractures Trauma services was following.  Incentive spirometry.  Rectus sheath hematoma.   Conservative management.  Hemoglobin has been stable.  Left ear laceration Repaired by ED provider with suture.  Left forehead laceration Repaired by ED provider with Dermabond.  Coronary artery disease  Stable.  Continue aspirin, Lipitor, metoprolol. No active chest pain.   Concern for cranial aneurysm Incidental finding noted on  initial CT head scan.  Radiology  recommended  CTA head and neck to rule out ACOM aneurysm.  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  Stenosis of the left vertebral artery.   Discussed this finding with  the patient's son at bedside.  Will need follow-up with neurosurgery as outpatient.  Spoke with Dr. Johnsie Cancel in neurosurgery who recommended outpatient follow-up.  Patient unlikely to be a surgical candidate due to advanced age with frailty and family wishes.   Hyperlipidemia Continue  Lipitor   DVT prophylaxis: Lovenox Code Status: DNR Family communication: No family at bedside.  Son updated yesterday.  Will do so again later today. Disposition: SNF when Medically stable.   Procedures:  None  Antimicrobials:  None  Anti-infectives (From admission, onward)    Start     Dose/Rate Route Frequency Ordered Stop   10/20/22 1200  ceFEPIme (MAXIPIME) 2 g in sodium chloride 0.9 % 100 mL IVPB  Status:  Discontinued        2 g 200 mL/hr over 30 Minutes Intravenous Every 24 hours 10/20/22 1020 10/20/22 1021   10/20/22 1115  ceFEPIme (MAXIPIME) 2 g in sodium chloride 0.9 % 100 mL IVPB        2 g 200 mL/hr over 30 Minutes Intravenous Daily 10/20/22 1022     10/20/22 1030  doxycycline (VIBRA-TABS) tablet 100 mg        100 mg Oral Every 12 hours 10/20/22 0931     10/18/22 1215  doxycycline (VIBRA-TABS) tablet 100 mg  Status:  Discontinued        100 mg Oral Every 12 hours 10/18/22 1123 10/19/22 1049       Subjective: She is very hard of hearing.  Does not appear to be in any discomfort but when her right arm was moved, she did complain of pain.  Objective: Vitals:   10/22/22 2302 10/23/22 0300 10/23/22 0303 10/23/22 0824  BP: 105/88 106/82  105/75  Pulse: 91 99  76  Resp: 20 11  14   Temp:  97.6 F (36.4 C)  (!) 96.7 F (35.9 C)  TempSrc: Axillary Oral  Axillary  SpO2: 100% 96%  99%  Weight:   76.2 kg   Height:        Intake/Output Summary (Last 24 hours) at 10/23/2022 0942 Last data filed at 10/23/2022 0700 Gross per 24 hour  Intake 480 ml  Output 150 ml  Net 330 ml    Filed Weights   10/21/22 0713 10/22/22 0311 10/23/22 0303  Weight: 76.2 kg 75.6 kg 76.2 kg    Physical Examination:  General appearance: Awake alert.  In no distress Resp: Clear to auscultation bilaterally.  Normal effort Cardio: S1-S2 is normal regular.  No S3-S4.  No rubs murmurs or bruit GI: Abdomen is soft.  Nontender nondistended.  Bowel sounds are present normal.  No masses  organomegaly Extremities: Some improvement in swelling and erythema noted of the right arm but there is drainage from the elbow area posteriorly.  Serous appearing fluid.  She does have lymphedema at baseline which makes examination difficult.  She is able to flex her elbow without difficulty. Neurologic: Alert and oriented x3.  No focal neurological deficits.    Data Reviewed:   CBC: Recent Labs  Lab 10/17/22 0305 10/18/22 0356 10/19/22 0719 10/20/22 0246 10/21/22 0600  WBC 9.4 16.2* 14.9* 12.3* 9.2  HGB 10.1* 9.8* 10.2* 9.9* 10.2*  HCT 31.0* 30.0* 31.4* 31.0* 31.6*  MCV 102.0* 104.2* 105.4* 103.7* 104.6*  PLT 163 144* 155 145* 162     Basic Metabolic Panel: Recent Labs  Lab 10/17/22 0305 10/18/22 0356 10/19/22 0411 10/20/22 0246 10/21/22 0600  NA 128* 128* 126* 127* 128*  K 5.3* 4.2 3.9 3.6  3.9  CL 95* 93* 92* 94* 96*  CO2 21* 25 21* 22 24  GLUCOSE 93 90 99 100* 85  BUN 20 19 21 20 21   CREATININE 1.17* 1.19* 1.10* 1.16* 1.15*  CALCIUM 8.4* 8.4* 8.3* 8.5* 8.5*  MG  --  2.2  --  2.2  --      Liver Function Tests: Recent Labs  Lab 10/21/22 0600  AST 21  ALT 21  ALKPHOS 102  BILITOT 1.7*  PROT 6.3*  ALBUMIN 2.9*      Radiology Studies: No results found.    LOS: 10 days   Osvaldo Shipper, MD [facility] Available via Epic secure chat 7am-7pm After these hours, please refer to coverage provider listed on [email or web address] 10/23/2022, 9:42 AM

## 2022-10-24 DIAGNOSIS — L03113 Cellulitis of right upper limb: Secondary | ICD-10-CM | POA: Diagnosis not present

## 2022-10-24 DIAGNOSIS — I482 Chronic atrial fibrillation, unspecified: Secondary | ICD-10-CM | POA: Diagnosis not present

## 2022-10-24 DIAGNOSIS — I5022 Chronic systolic (congestive) heart failure: Secondary | ICD-10-CM | POA: Diagnosis not present

## 2022-10-24 DIAGNOSIS — E871 Hypo-osmolality and hyponatremia: Secondary | ICD-10-CM | POA: Diagnosis not present

## 2022-10-24 LAB — CBC
HCT: 33.8 % — ABNORMAL LOW (ref 36.0–46.0)
Hemoglobin: 11.1 g/dL — ABNORMAL LOW (ref 12.0–15.0)
MCH: 34 pg (ref 26.0–34.0)
MCHC: 32.8 g/dL (ref 30.0–36.0)
MCV: 103.7 fL — ABNORMAL HIGH (ref 80.0–100.0)
Platelets: 135 10*3/uL — ABNORMAL LOW (ref 150–400)
RBC: 3.26 MIL/uL — ABNORMAL LOW (ref 3.87–5.11)
RDW: 18.3 % — ABNORMAL HIGH (ref 11.5–15.5)
WBC: 7.2 10*3/uL (ref 4.0–10.5)
nRBC: 0 % (ref 0.0–0.2)

## 2022-10-24 LAB — BASIC METABOLIC PANEL
Anion gap: 14 (ref 5–15)
BUN: 21 mg/dL (ref 8–23)
CO2: 17 mmol/L — ABNORMAL LOW (ref 22–32)
Calcium: 8.8 mg/dL — ABNORMAL LOW (ref 8.9–10.3)
Chloride: 97 mmol/L — ABNORMAL LOW (ref 98–111)
Creatinine, Ser: 0.95 mg/dL (ref 0.44–1.00)
GFR, Estimated: 55 mL/min — ABNORMAL LOW (ref >60)
Glucose, Bld: 90 mg/dL (ref 70–99)
Potassium: 5.7 mmol/L — ABNORMAL HIGH (ref 3.5–5.1)
Sodium: 128 mmol/L — ABNORMAL LOW (ref 135–145)

## 2022-10-24 LAB — POTASSIUM
Potassium: 6.4 mmol/L (ref 3.5–5.1)
Potassium: 4.8 mmol/L (ref 3.5–5.1)

## 2022-10-24 MED ORDER — CIPROFLOXACIN HCL 500 MG PO TABS
500.0000 mg | ORAL_TABLET | Freq: Two times a day (BID) | ORAL | Status: DC
  Administered 2022-10-24 – 2022-10-26 (×5): 500 mg via ORAL
  Filled 2022-10-24 (×6): qty 1

## 2022-10-24 NOTE — Progress Notes (Signed)
PROGRESS NOTE    Diana [name]  ZOX:[id number] DOB: Aug 19, 1923 DOA: 10/13/2022 PCP: Excell Seltzer, MD    Brief Narrative:  Diana [name] is a 89 year old female with past medical history of obstructive sleep apnea, hypertension, hypothyroidism, COPD presented to the hospital after mechanical fall.  Workup in the emergency department revealed rib fractures as well as scalp hematoma.  Patient was then admitted to the trauma service for further workup.  Medicine team was consulted for medication management.  Subsequently there was concern for right upper extremity cellulitis as well as worsening tachycardia.  Patient was transferred to the medical service.  Assessment and plan:  Cellulitis involving right upper extremity Patient with longstanding lymphedema due to previous surgery for breast cancer.  According to patient's son she periodically gets infection in the arm.  Right arm was noted to be quite swollen and erythematous.  Doppler studies were negative for DVT.  WBC was noted to be elevated.   Due to previous isolation of Pseudomonas from lower extremity wound she was placed on cefepime and also given doxycycline.   Right arm swelling and erythema slowly improving.  Drainage was noted so a CT scan was done which shows a lot of soft tissue edema but no evidence for focal abscess.  I suspect that it is her lymphedema that is causing some of this drainage.  Continue dressing changes. Will change cefepime to ciprofloxacin considering previous sensitivities.  Continue with doxycycline for now.  She remains afebrile.  Atrial fibrillation with RVR Patient with previous history of same.  Had RVR during the early part of this hospitalization likely due to all of her acute issues including infection.  Metoprolol dose was increased.  As infection was treated her heart rate improved. Was on apixaban prior to admission.  However considering her fall and multiple injuries as a result of the fall trauma  service does not recommend resuming anticoagulation.  Hyponatremia Started on salt tablets by trauma service.  Urine osmolality noted to be 894 with urine sodium less than 10.  Likely has element of SIADH.   Sodium level has been stable.   History of chronic systolic heart failure Review of previous TTE in May showed EF 25%.   Volume status is stable for the most part.  Diuretics on hold. Should be able to resume at discharge.  Hyperkalemia Likely due to hemolysis.  Will recheck potassium level tomorrow morning.  Not on any potassium supplements currently.   Hypothyroidism Continue Synthroid   Insomnia Continue trazodone  Macrocytic anemia Hemoglobin is stable.  Chronic constipation Continue bowel regimen.   Fall with Left scalp hematoma, Left-sided rib fractures Trauma services was following.  Incentive spirometry.  Rectus sheath hematoma.   Conservative management.  Hemoglobin has been stable.  Left ear laceration Repaired by ED provider with suture.  Left forehead laceration Repaired by ED provider with Dermabond.  Coronary artery disease  Stable.  Continue aspirin, Lipitor, metoprolol. No active chest pain.   Concern for cranial aneurysm Incidental finding noted on  initial CT head scan.  Radiology  recommended  CTA head and neck to rule out ACOM aneurysm.  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  Stenosis of the left vertebral artery.   Discussed this finding with the patient's son at bedside.  Will need follow-up with neurosurgery as outpatient.  Spoke with Dr. Johnsie Cancel in neurosurgery who recommended outpatient follow-up.  Patient  unlikely to be a surgical candidate due to advanced age with frailty and family wishes.   Hyperlipidemia Continue Lipitor   DVT prophylaxis: Lovenox Code Status: DNR Family communication: No family at bedside.  Son will  be updated later today. Disposition: SNF when Medically stable.  Hopefully 7/8.   Procedures:  None  Antimicrobials:  None  Anti-infectives (From admission, onward)    Start     Dose/Rate Route Frequency Ordered Stop   10/24/22 1015  ciprofloxacin (CIPRO) tablet 500 mg        500 mg Oral 2 times daily 10/24/22 0917 10/29/22 0759   10/20/22 1200  ceFEPIme (MAXIPIME) 2 g in sodium chloride 0.9 % 100 mL IVPB  Status:  Discontinued        2 g 200 mL/hr over 30 Minutes Intravenous Every 24 hours 10/20/22 1020 10/20/22 1021   10/20/22 1115  ceFEPIme (MAXIPIME) 2 g in sodium chloride 0.9 % 100 mL IVPB  Status:  Discontinued        2 g 200 mL/hr over 30 Minutes Intravenous Daily 10/20/22 1022 10/24/22 0917   10/20/22 1030  doxycycline (VIBRA-TABS) tablet 100 mg        100 mg Oral Every 12 hours 10/20/22 0931     10/18/22 1215  doxycycline (VIBRA-TABS) tablet 100 mg  Status:  Discontinued        100 mg Oral Every 12 hours 10/18/22 1123 10/19/22 1049       Subjective: Patient is very hard of hearing.  Appears to be comfortable.  Denies any complaints.  States that her right arm is feeling better.  Objective: Vitals:   10/23/22 2300 10/24/22 0312 10/24/22 0422 10/24/22 0810  BP: 102/81 101/85  (!) 106/92  Pulse: 78   93  Resp: 12   15  Temp: 98 F (36.7 C) 98.4 F (36.9 C)  97.7 F (36.5 C)  TempSrc: Axillary Axillary  Oral  SpO2: 100%   100%  Weight:   76.5 kg   Height:        Intake/Output Summary (Last 24 hours) at 10/24/2022 0917 Last data filed at 10/24/2022 0229 Gross per 24 hour  Intake 340 ml  Output 700 ml  Net -360 ml    Filed Weights   10/22/22 0311 10/23/22 0303 10/24/22 0422  Weight: 75.6 kg 76.2 kg 76.5 kg    Physical Examination:  General appearance: Awake alert.  In no distress distracted Resp: Clear to auscultation bilaterally.  Normal effort Cardio: S1-S2 is normal regular.  No S3-S4.  No rubs murmurs or bruit GI: Abdomen is soft.  Nontender  nondistended.  Bowel sounds are present normal.  No masses organomegaly Extremities: Significant swelling of the right arm but appears to be improving.  Less erythema.  Continues to have drainage from the right elbow area.  Able to move the arm and flex the elbow without difficulty.   Data Reviewed:   CBC: Recent Labs  Lab 10/18/22 0356 10/19/22 0719 10/20/22 0246 10/21/22 0600 10/24/22 0442  WBC 16.2* 14.9* 12.3* 9.2 7.2  HGB 9.8* 10.2* 9.9* 10.2* 11.1*  HCT 30.0* 31.4* 31.0* 31.6* 33.8*  MCV 104.2* 105.4* 103.7* 104.6* 103.7*  PLT 144* 155 145* 162 135*     Basic Metabolic Panel: Recent Labs  Lab 10/18/22 0356 10/19/22 0411 10/20/22 0246 10/21/22 0600 10/24/22 0442  NA 128* 126* 127* 128* 128*  K 4.2 3.9 3.6 3.9 5.7*  CL 93* 92* 94* 96* 97*  CO2 25 21* 22 24 17*  GLUCOSE 90 99 100* 85 90  BUN 19 21 20 21 21   CREATININE 1.19* 1.10* 1.16* 1.15* 0.95  CALCIUM 8.4* 8.3* 8.5* 8.5* 8.8*  MG 2.2  --  2.2  --   --      Liver Function Tests: Recent Labs  Lab 10/21/22 0600  AST 21  ALT 21  ALKPHOS 102  BILITOT 1.7*  PROT 6.3*  ALBUMIN 2.9*      Radiology Studies: CT HUMERUS RIGHT WO CONTRAST  Result Date: 10/24/2022 CLINICAL DATA:  Soft tissue infection suspected. Elbow swelling with drainage. EXAM: CT OF THE RIGHT HUMERUS WITHOUT CONTRAST CT OF THE RIGHT FOREARM WITHOUT CONTRAST TECHNIQUE: Multidetector CT imaging was performed according to the standard protocol. Multiplanar CT image reconstructions were also generated. RADIATION DOSE REDUCTION: This exam was performed according to the departmental dose-optimization program which includes automated exposure control, adjustment of the mA and/or kV according to patient size and/or use of iterative reconstruction technique. COMPARISON:  CT chest, abdomen and pelvis, 10/13/2022. Elbow radiographs, 10/13/2022. FINDINGS: Bones/Joint/Cartilage No fracture.  No bone lesion. No bone resorption to suggest osteomyelitis.  Chronic full-thickness tears with associated muscle atrophy of the supra and infraspinatus muscles. Humeral head nearly abuts the overlying acromion. No shoulder joint effusion. Elbow joint is normally aligned. No joint effusion. Wrist joints are normally aligned. Ligaments Suboptimally assessed by CT. Muscles and Tendons As noted above, chronic full-thickness tears are noted of the supra and infraspinatus tendons with associated muscle atrophy. Remaining visualized tendons appear intact. Soft tissues There is diffuse subcutaneous soft tissue edema extending from the shoulder through the forearm and into the hand. Ill-defined, but relatively focal fluid is seen posterior to the olecranon. There is no defined fluid collection to suggest an abscess. There is no soft tissue air. IMPRESSION: 1. Diffuse soft tissue edema/cellulitis of the right upper extremity. There is focal ill-defined fluid posterior to the olecranon, but no defined collection to suggest an abscess. No soft tissue air to indicate fasciitis. 2. No evidence of osteomyelitis. No elbow joint effusion to suggest septic arthropathy. Electronically Signed   By: Amie Portland M.D.   On: 10/24/2022 09:09   CT FOREARM RIGHT WO CONTRAST  Result Date: 10/24/2022 CLINICAL DATA:  Soft tissue infection suspected. Elbow swelling with drainage. EXAM: CT OF THE RIGHT HUMERUS WITHOUT CONTRAST CT OF THE RIGHT FOREARM WITHOUT CONTRAST TECHNIQUE: Multidetector CT imaging was performed according to the standard protocol. Multiplanar CT image reconstructions were also generated. RADIATION DOSE REDUCTION: This exam was performed according to the departmental dose-optimization program which includes automated exposure control, adjustment of the mA and/or kV according to patient size and/or use of iterative reconstruction technique. COMPARISON:  CT chest, abdomen and pelvis, 10/13/2022. Elbow radiographs, 10/13/2022. FINDINGS: Bones/Joint/Cartilage No fracture.  No bone  lesion. No bone resorption to suggest osteomyelitis. Chronic full-thickness tears with associated muscle atrophy of the supra and infraspinatus muscles. Humeral head nearly abuts the overlying acromion. No shoulder joint effusion. Elbow joint is normally aligned. No joint effusion. Wrist joints are normally aligned. Ligaments Suboptimally assessed by CT. Muscles and Tendons As noted above, chronic full-thickness tears are noted of the supra and infraspinatus tendons with associated muscle atrophy. Remaining visualized tendons appear intact. Soft tissues There is diffuse subcutaneous soft tissue edema extending from the shoulder through the forearm and into the hand. Ill-defined, but relatively focal fluid is seen posterior to the olecranon. There is no defined fluid collection to suggest an abscess. There is no soft tissue air. IMPRESSION: 1.  Diffuse soft tissue edema/cellulitis of the right upper extremity. There is focal ill-defined fluid posterior to the olecranon, but no defined collection to suggest an abscess. No soft tissue air to indicate fasciitis. 2. No evidence of osteomyelitis. No elbow joint effusion to suggest septic arthropathy. Electronically Signed   By: Amie Portland M.D.   On: 10/24/2022 09:09      LOS: 11 days   Osvaldo Shipper, MD [facility] Available via Epic secure chat 7am-7pm After these hours, please refer to coverage provider listed on [email or web address] 10/24/2022, 9:17 AM

## 2022-10-25 DIAGNOSIS — S2242XA Multiple fractures of ribs, left side, initial encounter for closed fracture: Secondary | ICD-10-CM | POA: Diagnosis not present

## 2022-10-25 MED ORDER — POLYETHYLENE GLYCOL 3350 17 G PO PACK
17.0000 g | PACK | Freq: Two times a day (BID) | ORAL | Status: DC
  Administered 2022-10-25 (×2): 17 g via ORAL
  Filled 2022-10-25 (×2): qty 1

## 2022-10-25 MED ORDER — OXYCODONE HCL 5 MG PO TABS
2.5000 mg | ORAL_TABLET | Freq: Four times a day (QID) | ORAL | Status: DC | PRN
  Administered 2022-10-25 – 2022-10-26 (×2): 5 mg via ORAL
  Filled 2022-10-25 (×2): qty 1

## 2022-10-25 MED ORDER — BISACODYL 10 MG RE SUPP
10.0000 mg | Freq: Once | RECTAL | Status: AC
  Administered 2022-10-25: 10 mg via RECTAL
  Filled 2022-10-25: qty 1

## 2022-10-25 MED ORDER — POLYETHYLENE GLYCOL 3350 17 G PO PACK: 17.0000 g | PACK | Freq: Two times a day (BID) | ORAL | 0 refills | Status: DC

## 2022-10-25 MED ORDER — HYDROCODONE-ACETAMINOPHEN 5-325 MG PO TABS: 1.0000 | ORAL_TABLET | Freq: Four times a day (QID) | ORAL | 0 refills | Status: DC | PRN

## 2022-10-25 MED ORDER — SODIUM CHLORIDE 0.9 % IV BOLUS
500.0000 mL | Freq: Once | INTRAVENOUS | Status: AC
  Administered 2022-10-25: 500 mL via INTRAVENOUS

## 2022-10-25 MED ORDER — SENNOSIDES-DOCUSATE SODIUM 8.6-50 MG PO TABS
2.0000 | ORAL_TABLET | Freq: Two times a day (BID) | ORAL | Status: DC
  Administered 2022-10-25 (×2): 2 via ORAL
  Filled 2022-10-25 (×2): qty 2

## 2022-10-25 MED ORDER — DOXYCYCLINE HYCLATE 100 MG PO TABS: 100.0000 mg | ORAL_TABLET | Freq: Two times a day (BID) | ORAL | Status: AC

## 2022-10-25 MED ORDER — METOPROLOL SUCCINATE ER 25 MG PO TB24
25.0000 mg | ORAL_TABLET | Freq: Two times a day (BID) | ORAL | Status: DC
  Administered 2022-10-25 – 2022-10-26 (×2): 25 mg via ORAL
  Filled 2022-10-25 (×2): qty 1

## 2022-10-25 MED ORDER — SODIUM CHLORIDE 1 G PO TABS: 1.0000 g | ORAL_TABLET | Freq: Three times a day (TID) | ORAL | Status: DC

## 2022-10-25 MED ORDER — CIPROFLOXACIN HCL 500 MG PO TABS: 500.0000 mg | ORAL_TABLET | Freq: Two times a day (BID) | ORAL | Status: AC

## 2022-10-25 NOTE — Care Management Important Message (Signed)
Important Message  Patient Details  Name: Diana [name] MRN: [id number] Date of Birth: 23-Feb-1924   Medicare Important Message Given:  Yes     Sherilyn Banker 10/25/2022, 3:25 PM

## 2022-10-25 NOTE — Consult Note (Signed)
   [facility] Inpatient Consult   10/25/2022  Diana [name] 15-Oct-1923 [id number]  [facility] [[facility]]  Accountable Care Organization [ACO] Patient: [employer] Medicare  Primary Care Provider:  Excell Seltzer, MD with [place] [facility] is listed to provide the transition of care follow up.   Patient was reviewed for Medium risk score with a 12 day length of stay barriers to care for community needs.  Patient was screened for hospitalization and on behalf of [facility] Care Coordination to assess for post hospital community care needs.  Patient is being considered for a skilled nursing facility level of care for post hospital transition.  If the patient goes to a [facility] affiliated facility then, patient can be followed by [facility] RN with traditional Medicare and approved Medicare Advantage plans. Currently, noted that patient is for [facility] which is a [facility] affiliated facility.   Plan:   Will notify the Community [facility] RN can follow for any known or needs for transitional care needs for returning to post facility care coordination needs to return to community.  For questions or referrals, please contact:   Charlesetta Shanks, RN BSN CCM [facility]  [phone] business mobile phone Toll free office [phone]  *Concierge Line  [phone] Fax number: [phone] [email or web address]

## 2022-10-25 NOTE — Progress Notes (Signed)
Physical Therapy Treatment Patient Details Name: Diana [name] MRN: [id number] DOB: 23-Jun-1923 Today's Date: 10/25/2022   History of Present Illness Pt is a 89 year old admitted 10/13/22 after mechanical fall. Dx with rib fractures L 2-5, scalp laceration and hematoma and small R PE. PMH includes chronic hearing loss, progressive bilateral lower extremity weakness, atrial fibrillation, essential hypertension -recently discharged from [facility] 09/06/2022 with cellulitis of the lower extremity.  She was discharged to a rehab facility where she was for a few weeks.  She subsequently was transferred to ALF 6/26 where she fell in the bathroom.    PT Comments  Session today focused on functional mobility and activity tolerance. Pt able to initiate BLE navigation to EOB but requires frequent cueing to stay on task, suspect due to [facility]. Mod A provided for push from supine to upright seated EOB. Pt attempts to stand x2 times to RW with x2 person max A provided to complete stand. Pt unable to unweight LE to attempt ambulation at this time, fear of falling expressed and posterior lean observed, second stand completed to position pt higher up in the bed. Pt returned to supine with all needs met and encouraged to eat to improve their strength, to which pt refused. Patient will benefit from continued inpatient follow up therapy, <3 hours/day to facilitate improvements in activity tolerance, functional transferring, and gait training.     Assistance Recommended at Discharge Frequent or constant Supervision/Assistance  If plan is discharge home, recommend the following:  Can travel by private vehicle    A little help with walking and/or transfers;A little help with bathing/dressing/bathroom;Assistance with cooking/housework;Assist for transportation;Direct supervision/assist for medications management   No  Equipment Recommendations  None recommended by PT    Recommendations for Other Services        Precautions / Restrictions Precautions Precautions: Fall Restrictions Weight Bearing Restrictions: No     Mobility  Bed Mobility Overal bed mobility: Needs Assistance Bed Mobility: Supine to Sit     Supine to sit: Mod assist, HOB elevated Sit to supine: +2 for physical assistance, Mod assist, HOB elevated   General bed mobility comments: Pt initiates BLE navigation but stops frequently needing cues to continue, mod A provided for RUE push up to upright seated and repositioning for dangle EOB    Transfers Overall transfer level: Needs assistance Equipment used: Rolling walker (2 wheels) Transfers: Sit to/from Stand Sit to Stand: Max assist, +2 physical assistance           General transfer comment: Cueing for hand placement, max A x2 person required to maintain hip clearance from bed. BLE slide anterior, PT blocks feet and complete stand achieved but presents with uncorrectable posterior lean. Unable to unweight BLE to attempt lateral stepping at this time.    Ambulation/Gait                   Stairs             Wheelchair Mobility     Tilt Bed    Modified Rankin (Stroke Patients Only)       Balance Overall balance assessment: Needs assistance Sitting-balance support: Single extremity supported, Feet supported Sitting balance-Leahy Scale: Poor   Postural control: Left lateral lean Standing balance support: Bilateral upper extremity supported, During functional activity, Reliant on assistive device for balance Standing balance-Leahy Scale: Poor Standing balance comment: Unable to weight shift in static standing, fear of falling, reliant on RW  Cognition Arousal/Alertness: Awake/alert Behavior During Therapy: WFL for tasks assessed/performed Overall Cognitive Status: Impaired/Different from baseline Area of Impairment: Safety/judgement, Problem solving, Attention                   Current  Attention Level: Focused     Safety/Judgement: Decreased awareness of safety, Decreased awareness of deficits   Problem Solving: Slow processing, Decreased initiation, Difficulty sequencing, Requires verbal cues, Requires tactile cues General Comments:  (signficant HOH, fear of falling with mobility)        Exercises      General Comments        Pertinent Vitals/Pain Pain Assessment Pain Assessment: No/denies pain Pain Intervention(s): Monitored during session    Home Living                          Prior Function            PT Goals (current goals can now be found in the care plan section) Acute Rehab PT Goals Patient Stated Goal: to return to ALF PT Goal Formulation: With patient/family Time For Goal Achievement: 10/28/22 Potential to Achieve Goals: Good Progress towards PT goals: Progressing toward goals    Frequency    Min 3X/week      PT Plan Current plan remains appropriate    Co-evaluation              AM-PAC PT "6 Clicks" Mobility   Outcome Measure  Help needed turning from your back to your side while in a flat bed without using bedrails?: A Lot Help needed moving from lying on your back to sitting on the side of a flat bed without using bedrails?: A Lot Help needed moving to and from a bed to a chair (including a wheelchair)?: A Lot Help needed standing up from a chair using your arms (e.g., wheelchair or bedside chair)?: A Lot Help needed to walk in hospital room?: Total Help needed climbing 3-5 steps with a railing? : Total 6 Click Score: 10    End of Session Equipment Utilized During Treatment: Gait belt Activity Tolerance: Patient tolerated treatment well Patient left: in bed;with call bell/phone within reach;with bed alarm set Nurse Communication: Mobility status PT Visit Diagnosis: Other abnormalities of gait and mobility (R26.89);History of falling (Z91.81)     Time: 2956-2130 PT Time Calculation (min) (ACUTE  ONLY): 25 min  Charges:    $Therapeutic Activity: 23-37 mins PT General Charges $$ ACUTE PT VISIT: 1 Visit                     Hendricks Milo, SPT  Acute Rehabilitation Services    Hendricks Milo 10/25/2022, 4:03 PM

## 2022-10-25 NOTE — Progress Notes (Signed)
Pt complaining of "hurting all over" despite Tylenol and PRN Oxycodone, pt continued to state her are is "killing her", this RN gave the PRN dilaudid after rechecking BP

## 2022-10-25 NOTE — Discharge Summary (Signed)
[facility]  Physician Discharge Summary   Patient ID: Diana [name] MRN: [id number] DOB/AGE: May 05, 1923 89 y.o.  Admit date: 10/13/2022 Discharge date:   10/25/2022   PCP: Excell Seltzer, MD  DISCHARGE DIAGNOSES:    Rib fractures   Hypothyroidism   Essential hypertension   COPD (chronic obstructive pulmonary disease) (HCC)   Congestive heart failure (HCC)   Hypokalemia Cellulitis of the right upper extremity. Hyponatremia Chronic atrial fibrillation  RECOMMENDATIONS FOR OUTPATIENT FOLLOW UP: Please check CBC and basic metabolic panel in 1 week Please involve palliative medicine in this patient's care    Home Health: Going to SNF Equipment/Devices: None  CODE STATUS: DNR  DISCHARGE CONDITION: fair  Diet recommendation: Soft diet  Wound care: Continue with wet-to-dry dressings over the right elbow  INITIAL HISTORY: Diana [name] is a 89 year old female with past medical history of obstructive sleep apnea, hypertension, hypothyroidism, COPD presented to the hospital after mechanical fall. Workup in the emergency department revealed rib fractures as well as scalp hematoma. Patient was then admitted to the trauma service for further workup. Medicine team was consulted for medication management. Subsequently there was concern for right upper extremity cellulitis as well as worsening tachycardia. Patient was transferred to the medical service.   HOSPITAL COURSE:   Cellulitis involving right upper extremity Patient with longstanding lymphedema due to previous surgery for breast cancer.  According to patient's son she periodically gets infection in the arm.  Right arm was noted to be quite swollen and erythematous.  Doppler studies were negative for DVT.  WBC was noted to be elevated.   Due to previous isolation of Pseudomonas from lower extremity wound she was placed on cefepime and also given doxycycline.   Right arm swelling and erythema slowly improving.  Drainage  was noted so a CT scan was done which shows a lot of soft tissue edema but no evidence for focal abscess.  I suspect that it is her lymphedema that is causing some of this drainage.  Continue dressing changes. She was changed over from cefepime to ciprofloxacin.  Continue with doxycycline.  Will plan treatment for 4 more days.   Atrial fibrillation with RVR Patient with previous history of same.  Had RVR during the early part of this hospitalization likely due to all of her acute issues including infection.  As infection was treated her heart rate improved. Continue with metoprolol. Was on apixaban prior to admission.  However considering her fall and multiple injuries as a result of the fall trauma service does not recommend resuming anticoagulation.   Hyponatremia Started on salt tablets by trauma service.  Urine osmolality noted to be 894 with urine sodium less than 10.  Likely has element of SIADH.   Sodium level has been stable.    History of chronic systolic heart failure Review of previous TTE in May showed EF 25%.   Volume status is stable for the most part.  Diuretics on hold. Should be able to resume at discharge. Blood pressure noted this morning which could be due to the narcotics she received last night.  Will discontinue her Flomax.  Give her a fluid bolus.  She is not symptomatic.  If blood pressure stabilizes she should be able to go to skilled nursing's facility later today. Elevated potassium level was due to hemolysis.  Subsequent levels normal.  Hypothyroidism Continue Synthroid   Insomnia Continue trazodone   Macrocytic anemia Hemoglobin is stable.   Chronic constipation Continue bowel regimen.    Fall  with Left scalp hematoma, Left-sided rib fractures Trauma services was following.  Incentive spirometry.  Quiring oxygen at 1 to 2 L/min.  Hopefully this can be weaned off at SNF as she becomes more ambulatory.   Rectus sheath hematoma.   Conservative management.   Hemoglobin has been stable.   Left ear laceration Repaired by ED provider with absorbable suture.   Left forehead laceration Repaired by ED provider with Dermabond.   Coronary artery disease  Stable.  Continue aspirin, Lipitor, metoprolol. No active chest pain.   Concern for cranial aneurysm Incidental finding noted on  initial CT head scan.  Radiology  recommended  CTA head and neck to rule out ACOM aneurysm.  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  Stenosis of the left vertebral artery.   Discussed this finding with the patient's son at bedside.  Will need follow-up with neurosurgery as outpatient.  Spoke with Dr. Johnsie Cancel in neurosurgery who recommended outpatient follow-up.  Patient unlikely to be a surgical candidate due to advanced age with frailty and family wishes.   Hyperlipidemia Continue Lipitor  Constipation Continue with bowel regimen.  Patient is stable for the most part.  If blood pressure stabilizes she should be able to go to skilled nursing facility later today.  PERTINENT LABS:  The results of significant diagnostics from this hospitalization (including imaging, microbiology, ancillary and laboratory) are listed below for reference.      Labs:   Basic Metabolic Panel: Recent Labs  Lab 10/19/22 0411 10/20/22 0246 10/21/22 0600 10/24/22 0442 10/24/22 1429 10/24/22 1632  NA 126* 127* 128* 128*  --   --   K 3.9 3.6 3.9 5.7* 6.4* 4.8  CL 92* 94* 96* 97*  --   --   CO2 21* 22 24 17*  --   --   GLUCOSE 99 100* 85 90  --   --   BUN 21 20 21 21   --   --   CREATININE 1.10* 1.16* 1.15* 0.95  --   --   CALCIUM 8.3* 8.5* 8.5* 8.8*  --   --   MG  --  2.2  --   --   --   --    Liver Function Tests: Recent Labs  Lab 10/21/22 0600  AST 21  ALT 21  ALKPHOS 102  BILITOT 1.7*  PROT 6.3*  ALBUMIN 2.9*    CBC: Recent Labs  Lab  10/19/22 0719 10/20/22 0246 10/21/22 0600 10/24/22 0442  WBC 14.9* 12.3* 9.2 7.2  HGB 10.2* 9.9* 10.2* 11.1*  HCT 31.4* 31.0* 31.6* 33.8*  MCV 105.4* 103.7* 104.6* 103.7*  PLT 155 145* 162 135*      IMAGING STUDIES CT HUMERUS RIGHT WO CONTRAST  Result Date: 10/24/2022 CLINICAL DATA:  Soft tissue infection suspected. Elbow swelling with drainage. EXAM: CT OF THE RIGHT HUMERUS WITHOUT CONTRAST CT OF THE RIGHT FOREARM WITHOUT CONTRAST TECHNIQUE: Multidetector CT imaging was performed according to the standard protocol. Multiplanar CT image reconstructions were also generated. RADIATION DOSE REDUCTION: This exam was performed according to the departmental dose-optimization program which includes automated exposure control, adjustment of the mA and/or kV according to patient size and/or use of iterative reconstruction technique. COMPARISON:  CT chest, abdomen and pelvis, 10/13/2022. Elbow radiographs, 10/13/2022. FINDINGS: Bones/Joint/Cartilage No fracture.  No bone lesion. No bone resorption to suggest osteomyelitis. Chronic full-thickness tears with associated muscle atrophy of the supra and infraspinatus  muscles. Humeral head nearly abuts the overlying acromion. No shoulder joint effusion. Elbow joint is normally aligned. No joint effusion. Wrist joints are normally aligned. Ligaments Suboptimally assessed by CT. Muscles and Tendons As noted above, chronic full-thickness tears are noted of the supra and infraspinatus tendons with associated muscle atrophy. Remaining visualized tendons appear intact. Soft tissues There is diffuse subcutaneous soft tissue edema extending from the shoulder through the forearm and into the hand. Ill-defined, but relatively focal fluid is seen posterior to the olecranon. There is no defined fluid collection to suggest an abscess. There is no soft tissue air. IMPRESSION: 1. Diffuse soft tissue edema/cellulitis of the right upper extremity. There is focal ill-defined fluid  posterior to the olecranon, but no defined collection to suggest an abscess. No soft tissue air to indicate fasciitis. 2. No evidence of osteomyelitis. No elbow joint effusion to suggest septic arthropathy. Electronically Signed   By: Amie Portland M.D.   On: 10/24/2022 09:09   CT FOREARM RIGHT WO CONTRAST  Result Date: 10/24/2022 CLINICAL DATA:  Soft tissue infection suspected. Elbow swelling with drainage. EXAM: CT OF THE RIGHT HUMERUS WITHOUT CONTRAST CT OF THE RIGHT FOREARM WITHOUT CONTRAST TECHNIQUE: Multidetector CT imaging was performed according to the standard protocol. Multiplanar CT image reconstructions were also generated. RADIATION DOSE REDUCTION: This exam was performed according to the departmental dose-optimization program which includes automated exposure control, adjustment of the mA and/or kV according to patient size and/or use of iterative reconstruction technique. COMPARISON:  CT chest, abdomen and pelvis, 10/13/2022. Elbow radiographs, 10/13/2022. FINDINGS: Bones/Joint/Cartilage No fracture.  No bone lesion. No bone resorption to suggest osteomyelitis. Chronic full-thickness tears with associated muscle atrophy of the supra and infraspinatus muscles. Humeral head nearly abuts the overlying acromion. No shoulder joint effusion. Elbow joint is normally aligned. No joint effusion. Wrist joints are normally aligned. Ligaments Suboptimally assessed by CT. Muscles and Tendons As noted above, chronic full-thickness tears are noted of the supra and infraspinatus tendons with associated muscle atrophy. Remaining visualized tendons appear intact. Soft tissues There is diffuse subcutaneous soft tissue edema extending from the shoulder through the forearm and into the hand. Ill-defined, but relatively focal fluid is seen posterior to the olecranon. There is no defined fluid collection to suggest an abscess. There is no soft tissue air. IMPRESSION: 1. Diffuse soft tissue edema/cellulitis of the right  upper extremity. There is focal ill-defined fluid posterior to the olecranon, but no defined collection to suggest an abscess. No soft tissue air to indicate fasciitis. 2. No evidence of osteomyelitis. No elbow joint effusion to suggest septic arthropathy. Electronically Signed   By: Amie Portland M.D.   On: 10/24/2022 09:09   VAS [place] UPPER EXTREMITY VENOUS DUPLEX  Result Date: 10/19/2022 UPPER VENOUS STUDY  Patient Name:  CHARNISHA FERREYRA  Date of Exam:   10/18/2022 Medical Rec #: [id number]         Accession #:    [id number] Date of Birth: Apr 16, 1924         Patient Gender: F Patient Age:   45 years Exam Location:  [facility] Procedure:      VAS [place] UPPER EXTREMITY VENOUS DUPLEX Referring Phys: Trixie Deis --------------------------------------------------------------------------------  Indications: Pain, and Edema Risk Factors: Status post fall with multi trauma. History of breast cancer, s/p mastectomy. History of recurrent mastitis. Limitations: Pain with touch, significant edema throughout upper extremity. Comparison Study: No prior study Performing Technologist: Sherren Kerns RVS  Examination Guidelines: A complete evaluation includes B-mode imaging,  spectral Doppler, color Doppler, and power Doppler as needed of all accessible portions of each vessel. Bilateral testing is considered an integral part of a complete examination. Limited examinations for reoccurring indications may be performed as noted.  Right Findings: +----------+------------+---------+-----------+----------+---------------+ RIGHT     CompressiblePhasicitySpontaneousProperties    Summary     +----------+------------+---------+-----------+----------+---------------+ IJV           Full       Yes       Yes                              +----------+------------+---------+-----------+----------+---------------+ Subclavian    Full       Yes       Yes                               +----------+------------+---------+-----------+----------+---------------+ Axillary                 Yes       Yes                              +----------+------------+---------+-----------+----------+---------------+ Brachial                 Yes       Yes                              +----------+------------+---------+-----------+----------+---------------+ Radial                                              patent by color +----------+------------+---------+-----------+----------+---------------+ Ulnar                                               patent by color +----------+------------+---------+-----------+----------+---------------+ Cephalic      Full       Yes       Yes                              +----------+------------+---------+-----------+----------+---------------+ Basilic                  Yes       Yes                              +----------+------------+---------+-----------+----------+---------------+  Left Findings: +----------+------------+---------+-----------+----------+-------+ LEFT      CompressiblePhasicitySpontaneousPropertiesSummary +----------+------------+---------+-----------+----------+-------+ Subclavian               Yes       Yes                      +----------+------------+---------+-----------+----------+-------+  Summary:  Right: No evidence of deep vein thrombosis in the upper extremity. No evidence of superficial vein thrombosis in the upper extremity.  Left: No evidence of thrombosis in the subclavian.  *See table(s) above for measurements and observations.  Diagnosing physician: Waverly Ferrari MD Electronically signed by Waverly Ferrari MD on 10/19/2022 at 8:31:10 AM.    Final  CT ANGIO HEAD NECK W WO CM  Result Date: 10/15/2022 CLINICAL DATA:  Cerebral aneurysm screening, high-risk concern for aneurysm. EXAM: CT ANGIOGRAPHY HEAD AND NECK WITH AND WITHOUT CONTRAST TECHNIQUE: Multidetector CT imaging of the  head and neck was performed using the standard protocol during bolus administration of intravenous contrast. Multiplanar CT image reconstructions and MIPs were obtained to evaluate the vascular anatomy. Carotid stenosis measurements (when applicable) are obtained utilizing NASCET criteria, using the distal internal carotid diameter as the denominator. RADIATION DOSE REDUCTION: This exam was performed according to the departmental dose-optimization program which includes automated exposure control, adjustment of the mA and/or kV according to patient size and/or use of iterative reconstruction technique. CONTRAST:  75mL OMNIPAQUE IOHEXOL 350 MG/ML SOLN COMPARISON:  Head CT 10/13/2022.  CT chest 10/13/2022. FINDINGS: CT HEAD FINDINGS Brain: No acute hemorrhage. Unchanged mild chronic small-vessel disease and temporal predominant volume loss. Cortical gray-white differentiation is otherwise preserved. Prominence of the ventricles and sulci within expected range for age. No hydrocephalus or extra-axial collection. 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 Vascular: No hyperdense vessel or unexpected calcification. Skull: No calvarial fracture or suspicious bone lesion. Skull base is unremarkable. Sinuses/Orbits: Unremarkable. Other: None. Review of the MIP images confirms the above findings CTA NECK FINDINGS Aortic arch: Three-vessel arch configuration. Atherosclerotic calcifications of the aortic arch and arch vessel origins. Arch vessel origins are patent. Right carotid system: No evidence of dissection, stenosis (50% or greater), or occlusion. Moderate calcified plaque along the proximal right cervical ICA. Left carotid system: No evidence of dissection, stenosis (50% or greater), or occlusion. Moderate calcified plaque along the left carotid bulb and proximal left  cervical ICA. Vertebral arteries: Dominant right vertebral artery. Diminutive left vertebral artery with severe stenosis of the origin. Nonvisualization of the distal V2 and proximal V3 segments with reconstitution of the distal V3 and V4 segments, likely secondary to diminutive size slow flow due to proximal stenosis. Skeleton: Prior C3 laminectomy.  No suspicious bone lesions. Other neck: Unremarkable. Upper chest: Partially visualized small right pleural effusion, better evaluated on recent chest CT. Review of the MIP images confirms the above findings CTA HEAD FINDINGS Anterior circulation: Intracranial ICAs are patent without stenosis or aneurysm. The proximal ACAs and MCAs are patent without stenosis or aneurysm. Distal branches are symmetric. Posterior circulation: Normal basilar artery. The SCAs, AICAs and PICAs are patent proximally. The PCAs are patent proximally without stenosis or aneurysm. Distal branches are symmetric. Venous sinuses: As permitted by contrast timing, patent. Anatomic variants: None. Review of the MIP images confirms the above findings IMPRESSION: 1. No acute intracranial abnormality. Unchanged mild chronic small-vessel disease and temporal predominant volume loss. 2. No large vessel occlusion, hemodynamically significant stenosis or aneurysm of the intracranial vessels. 3. 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 4. Severe stenosis of the diminutive left vertebral artery. Nonvisualization of the distal V2 and proximal V3 segments with reconstitution of the distal V3 and V4 segments, likely secondary to diminutive size and slow flow due to proximal stenosis. Aortic Atherosclerosis (ICD10-I70.0). Electronically Signed   By: Orvan Falconer M.D.   On: 10/15/2022 13:42   CT CHEST ABDOMEN PELVIS W CONTRAST  Result Date: 10/13/2022 CLINICAL DATA:  Pain after fall.  On Eliquis EXAM: CT CHEST, ABDOMEN, AND PELVIS  WITH CONTRAST TECHNIQUE: Multidetector CT imaging of the  chest, abdomen and pelvis was performed following the standard protocol during bolus administration of intravenous contrast. RADIATION DOSE REDUCTION: This exam was performed according to the departmental dose-optimization program which includes automated exposure control, adjustment of the mA and/or kV according to patient size and/or use of iterative reconstruction technique. CONTRAST:  75mL OMNIPAQUE IOHEXOL 350 MG/ML SOLN COMPARISON:  None Available. FINDINGS: CT CHEST FINDINGS Cardiovascular: Heart is enlarged particularly the right atrium. No pericardial effusion. There are calcifications in the area of the aortic valve. The thoracic aorta overall has a normal course and caliber with some scattered vascular calcifications. Coronary artery calcifications are seen. Is also significant calcifications along the great vessels. No mediastinal hematoma. Mediastinum/Nodes: No specific abnormal lymph node enlargement identified in the axillary regions, hilum or mediastinum. Normal caliber thoracic esophagus. Heterogeneous thyroid. Lungs/Pleura: There is a small right pleural effusion. Trace left. Breathing motion seen throughout the examination. There are some areas of bandlike changes along the lung bases likely scar or atelectasis. There are some ill-defined areas of ground-glass in both lungs as well. Please correlate for any evidence of air trapping. Mild bronchial wall thickening identified in several locations. No pneumothorax. Musculoskeletal: Scattered degenerative changes along the spine. Degenerative changes of the shoulders. Fractures involving the anterior aspect of the left second, third, fourth, fifth ribs. Based on appearance these are age-indeterminate. Please correlate to point tenderness. CT ABDOMEN PELVIS FINDINGS Hepatobiliary: Contrast is in the late arterial phase. Grossly preserved hepatic parenchyma. Dependent stone in the nondilated  gallbladder. Patent portal vein. Pancreas: Unremarkable. No pancreatic ductal dilatation or surrounding inflammatory changes. Spleen: Normal in size without focal abnormality. Adrenals/Urinary Tract: Slight thickening of the adrenal glands, nonspecific. Malrotated right kidney. Mild bilateral renal atrophy. No enhancing renal mass or collecting system dilatation. Distended urinary bladder. Stomach/Bowel: No oral contrast. Significant stool in the rectum. Scattered colonic diverticula. No large bowel or small bowel dilatation. Stomach is relatively collapsed. Small bowel is nondilated. No free air or free fluid. Vascular/Lymphatic: Aortic atherosclerosis. No enlarged abdominal or pelvic lymph nodes. Reproductive: Uterus and bilateral adnexa are unremarkable. Other: Nonspecific presacral fat stranding. Diffuse anasarca identified as well. No free intra-air or free fluid. Musculoskeletal: Left-sided rectus muscle hematoma identified. This extends cephalocaudal for proximally 9 cm. Transverse dimensions of 9.3 by 4.4 cm. No active extravasation. Curvature and degenerative changes along the spine. Degenerative changes of the pelvis. IMPRESSION: Left-sided rectus muscle hematoma measuring up to 9.3 cm. No active extravasation. Multiple left-sided anterior possibly acute rib fractures. No underlying pneumothorax. Small right pleural effusion.  Tiny left Enlarged heart. Colonic diverticulosis. Significant stool in the rectum. No bowel obstruction, free air or fluid collection. Gallstone. Anasarca.  Nonspecific stranding in the presacral pelvis. Electronically Signed   By: Karen Kays M.D.   On: 10/13/2022 17:17   CT Head Wo Contrast  Result Date: 10/13/2022 CLINICAL DATA:  Head trauma, minor (Age >= 65y); Facial trauma, blunt; Neck trauma (Age >= 65y) EXAM: CT HEAD WITHOUT CONTRAST CT MAXILLOFACIAL WITHOUT CONTRAST CT CERVICAL SPINE WITHOUT CONTRAST TECHNIQUE: Multidetector CT imaging of the head, cervical spine, and  maxillofacial structures were performed using the standard protocol without intravenous contrast. Multiplanar CT image reconstructions of the cervical spine and maxillofacial structures were also generated. RADIATION DOSE REDUCTION: This exam was performed according to the departmental dose-optimization program which includes automated exposure control, adjustment of the mA and/or kV according to patient size and/or use of iterative reconstruction technique. COMPARISON:  None Available. FINDINGS: CT HEAD FINDINGS Brain: No evidence of  acute infarction, hemorrhage, hydrocephalus, extra-axial collection. There is a 0.0 x 1.9 x 2.2 cm suprasellar mass with mass effect on the optic chiasm. 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 Sequela of moderate chronic microvascular ischemic change. Ventriculomegaly likely secondary to generalized volume loss. Vascular: No hyperdense vessel or unexpected calcification. Skull: Soft tissue hematoma along the left lateral frontal scalp. No evidence of underlying calvarial fracture. Other: None. CT MAXILLOFACIAL FINDINGS Osseous: No fracture or mandibular dislocation. No destructive process. Orbits: Negative. No traumatic or inflammatory finding. Sinuses: No middle ear or mastoid effusion. Paranasal sinuses are clear. Bilateral lens replacement. Orbits are otherwise unremarkable. Soft tissues: Soft tissue injury to the posterior auricular soft tissues on the left. CT CERVICAL SPINE FINDINGS Alignment: Straightening of the normal cervical lordosis. Grade 1 anterolisthesis of C3 on C4 and C4 on C5. Skull base and vertebrae: No acute fracture. No primary bone lesion or focal pathologic process. Soft tissues and spinal canal: No prevertebral fluid or swelling. No visible canal hematoma. Disc levels:  No evidence of high-grade spinal canal  stenosis. Upper chest: Negative. Other: None IMPRESSION: 1. Soft tissue hematoma along the left lateral frontal scalp. No evidence of underlying calvarial fracture or intracranial injury. 2. No acute facial bone fracture. 3. No acute cervical spine fracture. 4. There is a 0.0 x 1.9 x 2.2 cm suprasellar mass with mass effect on the optic chiasm. 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 Electronically Signed   By: Lorenza Cambridge M.D.   On: 10/13/2022 17:11   CT Cervical Spine Wo Contrast  Result Date: 10/13/2022 CLINICAL DATA:  Head trauma, minor (Age >= 65y); Facial trauma, blunt; Neck trauma (Age >= 65y) EXAM: CT HEAD WITHOUT CONTRAST CT MAXILLOFACIAL WITHOUT CONTRAST CT CERVICAL SPINE WITHOUT CONTRAST TECHNIQUE: Multidetector CT imaging of the head, cervical spine, and maxillofacial structures were performed using the standard protocol without intravenous contrast. Multiplanar CT image reconstructions of the cervical spine and maxillofacial structures were also generated. RADIATION DOSE REDUCTION: This exam was performed according to the departmental dose-optimization program which includes automated exposure control, adjustment of the mA and/or kV according to patient size and/or use of iterative reconstruction technique. COMPARISON:  None Available. FINDINGS: CT HEAD FINDINGS Brain: No evidence of acute infarction, hemorrhage, hydrocephalus, extra-axial collection. There is a 0.0 x 1.9 x 2.2 cm suprasellar mass with mass effect on the optic chiasm. 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 Sequela of moderate chronic microvascular ischemic change. Ventriculomegaly likely secondary to generalized volume loss. Vascular: No  hyperdense vessel or unexpected calcification. Skull: Soft tissue hematoma along the left lateral frontal scalp. No evidence of underlying calvarial fracture. Other: None. CT MAXILLOFACIAL FINDINGS Osseous: No fracture or mandibular dislocation. No destructive process. Orbits: Negative. No traumatic or inflammatory finding. Sinuses: No middle ear or mastoid effusion. Paranasal sinuses are clear. Bilateral lens replacement. Orbits are otherwise unremarkable. Soft tissues: Soft tissue injury to the posterior auricular soft tissues on the left. CT CERVICAL SPINE FINDINGS Alignment: Straightening of the normal cervical lordosis. Grade 1 anterolisthesis of C3  on C4 and C4 on C5. Skull base and vertebrae: No acute fracture. No primary bone lesion or focal pathologic process. Soft tissues and spinal canal: No prevertebral fluid or swelling. No visible canal hematoma. Disc levels:  No evidence of high-grade spinal canal stenosis. Upper chest: Negative. Other: None IMPRESSION: 1. Soft tissue hematoma along the left lateral frontal scalp. No evidence of underlying calvarial fracture or intracranial injury. 2. No acute facial bone fracture. 3. No acute cervical spine fracture. 4. There is a 0.0 x 1.9 x 2.2 cm suprasellar mass with mass effect on the optic chiasm. 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 Electronically Signed   By: Lorenza Cambridge M.D.   On: 10/13/2022 17:11   CT Maxillofacial Wo Contrast  Result Date: 10/13/2022 CLINICAL DATA:  Head trauma, minor (Age >= 65y); Facial trauma, blunt; Neck trauma (Age >= 65y) EXAM: CT HEAD WITHOUT CONTRAST CT MAXILLOFACIAL WITHOUT CONTRAST CT CERVICAL SPINE WITHOUT CONTRAST TECHNIQUE: Multidetector CT imaging of the head, cervical spine, and maxillofacial structures were performed using the standard protocol without intravenous  contrast. Multiplanar CT image reconstructions of the cervical spine and maxillofacial structures were also generated. RADIATION DOSE REDUCTION: This exam was performed according to the departmental dose-optimization program which includes automated exposure control, adjustment of the mA and/or kV according to patient size and/or use of iterative reconstruction technique. COMPARISON:  None Available. FINDINGS: CT HEAD FINDINGS Brain: No evidence of acute infarction, hemorrhage, hydrocephalus, extra-axial collection. There is a 0.0 x 1.9 x 2.2 cm suprasellar mass with mass effect on the optic chiasm. 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 Sequela of moderate chronic microvascular ischemic change. Ventriculomegaly likely secondary to generalized volume loss. Vascular: No hyperdense vessel or unexpected calcification. Skull: Soft tissue hematoma along the left lateral frontal scalp. No evidence of underlying calvarial fracture. Other: None. CT MAXILLOFACIAL FINDINGS Osseous: No fracture or mandibular dislocation. No destructive process. Orbits: Negative. No traumatic or inflammatory finding. Sinuses: No middle ear or mastoid effusion. Paranasal sinuses are clear. Bilateral lens replacement. Orbits are otherwise unremarkable. Soft tissues: Soft tissue injury to the posterior auricular soft tissues on the left. CT CERVICAL SPINE FINDINGS Alignment: Straightening of the normal cervical lordosis. Grade 1 anterolisthesis of C3 on C4 and C4 on C5. Skull base and vertebrae: No acute fracture. No primary bone lesion or focal pathologic process. Soft tissues and spinal canal: No prevertebral fluid or swelling. No visible canal hematoma. Disc levels:  No evidence of high-grade spinal canal stenosis. Upper chest: Negative. Other: None IMPRESSION: 1. Soft tissue hematoma along the  left lateral frontal scalp. No evidence of underlying calvarial fracture or intracranial injury. 2. No acute facial bone fracture. 3. No acute cervical spine fracture. 4. There is a 0.0 x 1.9 x 2.2 cm suprasellar mass with mass effect on the optic chiasm. 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 Electronically Signed   By: Lorenza Cambridge M.D.   On: 10/13/2022 17:11   DG Pelvis Portable  Result Date: 10/13/2022 CLINICAL DATA:  Fall. EXAM: PORTABLE PELVIS 1-2 VIEWS  COMPARISON:  None Available. FINDINGS: There is no evidence of pelvic fracture or diastasis. No pelvic bone lesions are seen. IMPRESSION: Negative. Electronically Signed   By: Lupita Raider M.D.   On: 10/13/2022 16:37   DG Shoulder Left Portable  Result Date: 10/13/2022 CLINICAL DATA:  Fall. EXAM: LEFT SHOULDER COMPARISON:  October 17, 2013. FINDINGS: No definite fracture is noted. Old left rib fractures are noted. There does appear to be some elevation of the distal end of the left clavicle relative to acromion which was present on prior exam suggesting acromioclavicular joint separation. IMPRESSION: Stable elevation of distal end of left clavicle relative to the acromion suggesting stable old acromioclavicular joint separation. No acute fracture or dislocation is noted. Electronically Signed   By: Lupita Raider M.D.   On: 10/13/2022 16:37   DG Elbow 2 Views Left  Result Date: 10/13/2022 CLINICAL DATA:  Fall. EXAM: LEFT ELBOW - 2 VIEW COMPARISON:  None Available. FINDINGS: There is no evidence of fracture, dislocation, or joint effusion. There is no evidence of arthropathy or other focal bone abnormality. Soft tissues are unremarkable. IMPRESSION: Negative. Electronically Signed   By: Lupita Raider M.D.   On: 10/13/2022 16:35   DG Knee 2 Views Left  Result Date: 10/13/2022 CLINICAL DATA:  Left  knee pain after fall. EXAM: LEFT KNEE - 1-2 VIEW COMPARISON:  None Available. FINDINGS: No evidence of fracture, dislocation, or joint effusion. No evidence of arthropathy or other focal bone abnormality. Vascular calcifications are noted. IMPRESSION: No significant abnormality seen in the left knee. Electronically Signed   By: Lupita Raider M.D.   On: 10/13/2022 16:33   DG Hip Unilat W or Wo Pelvis 2-3 Views Left  Result Date: 10/13/2022 CLINICAL DATA:  Status post fall.  Rule out fracture. EXAM: DG HIP (WITH OR WITHOUT PELVIS) 2-3V LEFT COMPARISON:  None Available. FINDINGS: There is no evidence of hip fracture or dislocation. There is cortical irregularity of the superior pubic ramus on the left, obscured by formed stool in the rectum. IMPRESSION: 1. No evidence of hip fracture or dislocation. 2. Cortical irregularity of the superior pubic ramus on the left, obscured by formed stool in the rectum. Consider further evaluation with dedicated pelvic radiograph or CT. Electronically Signed   By: Ted Mcalpine M.D.   On: 10/13/2022 16:32   DG Chest Port 1 View  Result Date: 10/13/2022 CLINICAL DATA:  Trauma. EXAM: PORTABLE CHEST 1 VIEW COMPARISON:  Sep 07, 2022 FINDINGS: Cardiomediastinal silhouette is enlarged. Mediastinal contours appear intact. Possible interstitial pulmonary edema with small bilateral pleural effusions. Osseous structures are without acute abnormality. Soft tissues are grossly normal. IMPRESSION: Possible interstitial pulmonary edema with small bilateral pleural effusions. Electronically Signed   By: Ted Mcalpine M.D.   On: 10/13/2022 16:30    DISCHARGE EXAMINATION: Vitals:   10/25/22 0304 10/25/22 0500 10/25/22 0736 10/25/22 0800  BP: 106/69  (!) 88/71 (!) 82/67  Pulse: 98  99 96  Resp: 14  18 16   Temp: 97.8 F (36.6 C)  98 F (36.7 C)   TempSrc: Axillary  Oral   SpO2: 94%  93% 96%  Weight:  76.5 kg    Height:       General appearance: Awake alert.  In no  distress Resp: Clear to auscultation bilaterally.  Normal effort Cardio: S1-S2 is normal regular.  No S3-S4.  No rubs murmurs or bruit GI: Abdomen is soft.  Nontender nondistended.  Bowel sounds are present normal.  No masses organomegaly Swelling of the right upper extremity appears to be slowly improving.  Erythema is improved as well.  Still has some drainage from the right elbow area.   DISPOSITION: SNF  Discharge Instructions     Call MD for:  difficulty breathing, headache or visual disturbances   Complete by: As directed    Call MD for:  extreme fatigue   Complete by: As directed    Call MD for:  persistant dizziness or light-headedness   Complete by: As directed    Call MD for:  persistant nausea and vomiting   Complete by: As directed    Call MD for:  severe uncontrolled pain   Complete by: As directed    Call MD for:  temperature >100.4   Complete by: As directed    Discharge instructions   Complete by: As directed    Please review instructions in the discharge summary.  You were cared for by a hospitalist during your hospital stay. If you have any questions about your discharge medications or the care you received while you were in the hospital after you are discharged, you can call the unit and asked to speak with the hospitalist on call if the hospitalist that took care of you is not available. Once you are discharged, your primary care physician will handle any further medical issues. Please note that NO REFILLS for any discharge medications will be authorized once you are discharged, as it is imperative that you return to your primary care physician (or establish a relationship with a primary care physician if you do not have one) for your aftercare needs so that they can reassess your need for medications and monitor your lab values. If you do not have a primary care physician, you can call [phone] for a physician referral.   Increase activity slowly   Complete by: As  directed    No wound care   Complete by: As directed           Allergies as of 10/25/2022       Reactions   Meperidine Hcl Nausea And Vomiting        Medication List     STOP taking these medications    apixaban 5 MG Tabs tablet Commonly known as: ELIQUIS   potassium chloride 10 MEQ tablet Commonly known as: KLOR-CON M       TAKE these medications    aspirin EC 81 MG tablet Take 1 tablet (81 mg total) by mouth daily. Swallow whole.   atorvastatin 40 MG tablet Commonly known as: LIPITOR Take 1 tablet (40 mg total) by mouth daily. What changed: when to take this   ciprofloxacin 500 MG tablet Commonly known as: CIPRO Take 1 tablet (500 mg total) by mouth 2 (two) times daily for 4 days.   doxycycline 100 MG tablet Commonly known as: VIBRA-TABS Take 1 tablet (100 mg total) by mouth every 12 (twelve) hours for 4 days.   furosemide 20 MG tablet Commonly known as: LASIX Take 20 mg by mouth daily.   HYDROcodone-acetaminophen 5-325 MG tablet Commonly known as: NORCO/VICODIN Take 1 tablet by mouth every 6 (six) hours as needed for moderate pain. What changed:  when to take this reasons to take this   levothyroxine 100 MCG tablet Commonly known as: SYNTHROID Take 1 tablet (100 mcg total) by mouth daily.   metoprolol succinate 25 MG 24 hr tablet Commonly known as: TOPROL-XL Take 1 tablet (25 mg total) by mouth 2 (two)  times daily.   polyethylene glycol 17 g packet Commonly known as: MIRALAX / GLYCOLAX Take 17 g by mouth 2 (two) times daily.   senna 8.6 MG Tabs tablet Commonly known as: SENOKOT Take 1 tablet by mouth daily.   sodium chloride 1 g tablet Take 1 tablet (1 g total) by mouth 3 (three) times daily with meals.   traZODone 50 MG tablet Commonly known as: DESYREL TAKE 1/2 TO 1 TABLET BY MOUTH AT [facility] NEEDED FOR SLEEP What changed: See the new instructions.          Follow-up Information     Excell Seltzer, MD. Schedule an  appointment as soon as possible for a visit.   Specialty: Family Medicine Why: To follow-up after recent hospitalization and for management of anticoagulation Contact information: [address] [place] [address] [phone]         [facility]. Call.   Why: As needed Contact information: [address] [place] [address] [phone]                TOTAL DISCHARGE TIME: 35 minutes  Annisa Mazzarella Rito Ehrlich  [facility] Pager on [email or web address]  10/25/2022, 8:48 AM

## 2022-10-26 DIAGNOSIS — R296 Repeated falls: Secondary | ICD-10-CM | POA: Diagnosis not present

## 2022-10-26 DIAGNOSIS — R278 Other lack of coordination: Secondary | ICD-10-CM | POA: Diagnosis not present

## 2022-10-26 DIAGNOSIS — E871 Hypo-osmolality and hyponatremia: Secondary | ICD-10-CM | POA: Diagnosis not present

## 2022-10-26 DIAGNOSIS — M6281 Muscle weakness (generalized): Secondary | ICD-10-CM | POA: Diagnosis not present

## 2022-10-26 DIAGNOSIS — S41101A Unspecified open wound of right upper arm, initial encounter: Secondary | ICD-10-CM | POA: Diagnosis not present

## 2022-10-26 DIAGNOSIS — Z743 Need for continuous supervision: Secondary | ICD-10-CM | POA: Diagnosis not present

## 2022-10-26 DIAGNOSIS — W1830XA Fall on same level, unspecified, initial encounter: Secondary | ICD-10-CM | POA: Diagnosis not present

## 2022-10-26 DIAGNOSIS — Z7401 Bed confinement status: Secondary | ICD-10-CM | POA: Diagnosis not present

## 2022-10-26 DIAGNOSIS — Z9181 History of falling: Secondary | ICD-10-CM | POA: Diagnosis not present

## 2022-10-26 DIAGNOSIS — L03113 Cellulitis of right upper limb: Secondary | ICD-10-CM | POA: Diagnosis not present

## 2022-10-26 DIAGNOSIS — R2689 Other abnormalities of gait and mobility: Secondary | ICD-10-CM | POA: Diagnosis not present

## 2022-10-26 DIAGNOSIS — Y93E9 Activity, other interior property and clothing maintenance: Secondary | ICD-10-CM | POA: Diagnosis not present

## 2022-10-26 DIAGNOSIS — Y92099 Unspecified place in other non-institutional residence as the place of occurrence of the external cause: Secondary | ICD-10-CM | POA: Diagnosis not present

## 2022-10-26 DIAGNOSIS — I509 Heart failure, unspecified: Secondary | ICD-10-CM | POA: Diagnosis not present

## 2022-10-26 DIAGNOSIS — M6258 Muscle wasting and atrophy, not elsewhere classified, other site: Secondary | ICD-10-CM | POA: Diagnosis not present

## 2022-10-26 DIAGNOSIS — I10 Essential (primary) hypertension: Secondary | ICD-10-CM | POA: Diagnosis not present

## 2022-10-26 DIAGNOSIS — Z23 Encounter for immunization: Secondary | ICD-10-CM | POA: Diagnosis not present

## 2022-10-26 DIAGNOSIS — I480 Paroxysmal atrial fibrillation: Secondary | ICD-10-CM | POA: Diagnosis not present

## 2022-10-26 DIAGNOSIS — R1312 Dysphagia, oropharyngeal phase: Secondary | ICD-10-CM | POA: Diagnosis not present

## 2022-10-26 DIAGNOSIS — L03116 Cellulitis of left lower limb: Secondary | ICD-10-CM | POA: Diagnosis not present

## 2022-10-26 DIAGNOSIS — S51812S Laceration without foreign body of left forearm, sequela: Secondary | ICD-10-CM | POA: Diagnosis not present

## 2022-10-26 DIAGNOSIS — S2242XA Multiple fractures of ribs, left side, initial encounter for closed fracture: Secondary | ICD-10-CM | POA: Diagnosis not present

## 2022-10-26 DIAGNOSIS — D638 Anemia in other chronic diseases classified elsewhere: Secondary | ICD-10-CM | POA: Diagnosis not present

## 2022-10-26 DIAGNOSIS — R531 Weakness: Secondary | ICD-10-CM | POA: Diagnosis not present

## 2022-10-26 DIAGNOSIS — S2242XD Multiple fractures of ribs, left side, subsequent encounter for fracture with routine healing: Secondary | ICD-10-CM | POA: Diagnosis not present

## 2022-10-26 DIAGNOSIS — I5022 Chronic systolic (congestive) heart failure: Secondary | ICD-10-CM | POA: Diagnosis not present

## 2022-10-26 DIAGNOSIS — I5032 Chronic diastolic (congestive) heart failure: Secondary | ICD-10-CM | POA: Diagnosis not present

## 2022-10-26 MED ORDER — ENOXAPARIN SODIUM 40 MG/0.4ML IJ SOSY: 40.0000 mg | PREFILLED_SYRINGE | INTRAMUSCULAR | Status: DC

## 2022-10-26 MED ORDER — ONDANSETRON 4 MG PO TBDP: 4.0000 mg | ORAL_TABLET | Freq: Four times a day (QID) | ORAL | 0 refills | Status: DC | PRN

## 2022-10-26 NOTE — TOC Progression Note (Signed)
Transition of Care [facility]) - Progression Note    Patient Details  Name: Diana [name] MRN: [id number] Date of Birth: 14-Jul-1923  Transition of [facility]) CM/SW Contact  Eduard Roux, [place] Phone Number: 10/26/2022, 11:19 AM  Clinical Narrative:     CSW left message for [facility] admissions to return call  Antony Blackbird, MSW, LCSW Clinical Social Worker    Expected Discharge Plan: Skilled Nursing Facility Barriers to Discharge: Barriers Resolved  Expected Discharge Plan and Services   Discharge Planning Services: CM Consult Post Acute Care Choice: Skilled Nursing Facility   Expected Discharge Date: 10/26/22                         HH Arranged: PT, OT, Speech Therapy HH Agency: [facility] Date [facility] Agency Contacted: 10/14/22 Time HH Agency Contacted: 1707 Representative spoke with at [facility] Agency: Angie   Social Determinants of Health (SDOH) Interventions SDOH Screenings   Food Insecurity: No Food Insecurity (09/03/2022)  Housing: Low Risk  (09/03/2022)  Transportation Needs: No Transportation Needs (09/03/2022)  Utilities: Not At Risk (09/03/2022)  Alcohol Screen: Low Risk  (06/10/2022)  Depression (PHQ2-9): Low Risk  (06/10/2022)  Financial Resource Strain: Low Risk  (06/10/2022)  Physical Activity: Inactive (06/10/2022)  Social Connections: Socially Isolated (06/10/2022)  Stress: No Stress Concern Present (06/10/2022)  Tobacco Use: Medium Risk (10/13/2022)    Readmission Risk Interventions     No data to display

## 2022-10-26 NOTE — Discharge Summary (Signed)
[facility]  Physician Discharge Summary   Patient ID: Diana [name] MRN: [id number] DOB/AGE: 10-21-1923 89 y.o.  Admit date: 10/13/2022 Discharge date:   10/26/2022   PCP: Excell Seltzer, MD  DISCHARGE DIAGNOSES:    Rib fractures   Hypothyroidism   Essential hypertension   COPD (chronic obstructive pulmonary disease) (HCC)   Congestive heart failure (HCC)   Hypokalemia Cellulitis of the right upper extremity. Hyponatremia Chronic atrial fibrillation  RECOMMENDATIONS FOR OUTPATIENT FOLLOW UP: Please check CBC and basic metabolic panel in 1 week Please involve palliative medicine in this patient's care    Home Health: Going to SNF Equipment/Devices: None  CODE STATUS: DNR  DISCHARGE CONDITION: fair  Diet recommendation: Soft diet  Wound care: Continue with wet-to-dry dressings over the right elbow  INITIAL HISTORY: Diana [name] is a 89 year old female with past medical history of obstructive sleep apnea, hypertension, hypothyroidism, COPD presented to the hospital after mechanical fall. Workup in the emergency department revealed rib fractures as well as scalp hematoma. Patient was then admitted to the trauma service for further workup. Medicine team was consulted for medication management. Subsequently there was concern for right upper extremity cellulitis as well as worsening tachycardia. Patient was transferred to the medical service.   HOSPITAL COURSE:   Cellulitis involving right upper extremity Patient with longstanding lymphedema due to previous surgery for breast cancer.  According to patient's son she periodically gets infection in the arm.  Right arm was noted to be quite swollen and erythematous.  Doppler studies were negative for DVT.  WBC was noted to be elevated.   Due to previous isolation of Pseudomonas from lower extremity wound she was placed on cefepime and also given doxycycline.   Right arm swelling and erythema slowly improving.  Drainage  was noted so a CT scan was done which shows a lot of soft tissue edema but no evidence for focal abscess.  I suspect that it is her lymphedema that is causing some of this drainage.  Continue dressing changes. She was changed over from cefepime to ciprofloxacin.  Continue with doxycycline.  Will plan treatment for 4 more days.   Atrial fibrillation with RVR Patient with previous history of same.  Had RVR during the early part of this hospitalization likely due to all of her acute issues including infection.  As infection was treated her heart rate improved. Continue with metoprolol. Was on apixaban prior to admission.  However considering her fall and multiple injuries as a result of the fall trauma service does not recommend resuming anticoagulation.   Hyponatremia Started on salt tablets by trauma service.  Urine osmolality noted to be 894 with urine sodium less than 10.  Likely has element of SIADH.   Sodium level has been stable.    History of chronic systolic heart failure Review of previous TTE in May showed EF 25%.   Volume status is stable for the most part.  Diuretics on hold. Should be able to resume at discharge. Blood pressure noted this morning which could be due to the narcotics she received last night.  Will discontinue her Flomax.  Give her a fluid bolus.  She is not symptomatic.  If blood pressure stabilizes she should be able to go to skilled nursing's facility later today. Elevated potassium level was due to hemolysis.  Subsequent levels normal.  Hypothyroidism Continue Synthroid   Insomnia Continue trazodone   Macrocytic anemia Hemoglobin is stable.   Chronic constipation Continue bowel regimen.    Fall  with Left scalp hematoma, Left-sided rib fractures Trauma services was following.  Incentive spirometry.  Quiring oxygen at 1 to 2 L/min.  Hopefully this can be weaned off at SNF as she becomes more ambulatory.   Rectus sheath hematoma.   Conservative management.   Hemoglobin has been stable.   Left ear laceration Repaired by ED provider with absorbable suture.   Left forehead laceration Repaired by ED provider with Dermabond.   Coronary artery disease  Stable.  Continue aspirin, Lipitor, metoprolol. No active chest pain.   Concern for cranial aneurysm Incidental finding noted on  initial CT head scan.  Radiology  recommended  CTA head and neck to rule out ACOM aneurysm.  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  Stenosis of the left vertebral artery.   Discussed this finding with the patient's son at bedside.  Will need follow-up with neurosurgery as outpatient.  Spoke with Dr. Johnsie Cancel in neurosurgery who recommended outpatient follow-up.  Patient unlikely to be a surgical candidate due to advanced age with frailty and family wishes.   Hyperlipidemia Continue Lipitor  Constipation Continue with bowel regimen.  Patient is stable.  Blood pressure has improved.  Heart rate is also better this morning.  Complains of some nausea.  Abdomen is benign.  She will be given an antiemetic.  Should be able to go to skilled nursing facility later today.   PERTINENT LABS:  The results of significant diagnostics from this hospitalization (including imaging, microbiology, ancillary and laboratory) are listed below for reference.      Labs:   Basic Metabolic Panel: Recent Labs  Lab 10/20/22 0246 10/21/22 0600 10/24/22 0442 10/24/22 1429 10/24/22 1632  NA 127* 128* 128*  --   --   K 3.6 3.9 5.7* 6.4* 4.8  CL 94* 96* 97*  --   --   CO2 22 24 17*  --   --   GLUCOSE 100* 85 90  --   --   BUN 20 21 21   --   --   CREATININE 1.16* 1.15* 0.95  --   --   CALCIUM 8.5* 8.5* 8.8*  --   --   MG 2.2  --   --   --   --    Liver Function Tests: Recent Labs  Lab 10/21/22 0600  AST 21  ALT 21  ALKPHOS 102  BILITOT 1.7*  PROT 6.3*   ALBUMIN 2.9*    CBC: Recent Labs  Lab 10/20/22 0246 10/21/22 0600 10/24/22 0442  WBC 12.3* 9.2 7.2  HGB 9.9* 10.2* 11.1*  HCT 31.0* 31.6* 33.8*  MCV 103.7* 104.6* 103.7*  PLT 145* 162 135*      IMAGING STUDIES CT HUMERUS RIGHT WO CONTRAST  Result Date: 10/24/2022 CLINICAL DATA:  Soft tissue infection suspected. Elbow swelling with drainage. EXAM: CT OF THE RIGHT HUMERUS WITHOUT CONTRAST CT OF THE RIGHT FOREARM WITHOUT CONTRAST TECHNIQUE: Multidetector CT imaging was performed according to the standard protocol. Multiplanar CT image reconstructions were also generated. RADIATION DOSE REDUCTION: This exam was performed according to the departmental dose-optimization program which includes automated exposure control, adjustment of the mA and/or kV according to patient size and/or use of iterative reconstruction technique. COMPARISON:  CT chest, abdomen and pelvis, 10/13/2022. Elbow radiographs, 10/13/2022. FINDINGS: Bones/Joint/Cartilage No fracture.  No bone lesion. No bone resorption to suggest osteomyelitis. Chronic full-thickness tears with associated muscle atrophy of the supra and  infraspinatus muscles. Humeral head nearly abuts the overlying acromion. No shoulder joint effusion. Elbow joint is normally aligned. No joint effusion. Wrist joints are normally aligned. Ligaments Suboptimally assessed by CT. Muscles and Tendons As noted above, chronic full-thickness tears are noted of the supra and infraspinatus tendons with associated muscle atrophy. Remaining visualized tendons appear intact. Soft tissues There is diffuse subcutaneous soft tissue edema extending from the shoulder through the forearm and into the hand. Ill-defined, but relatively focal fluid is seen posterior to the olecranon. There is no defined fluid collection to suggest an abscess. There is no soft tissue air. IMPRESSION: 1. Diffuse soft tissue edema/cellulitis of the right upper extremity. There is focal ill-defined fluid  posterior to the olecranon, but no defined collection to suggest an abscess. No soft tissue air to indicate fasciitis. 2. No evidence of osteomyelitis. No elbow joint effusion to suggest septic arthropathy. Electronically Signed   By: Amie Portland M.D.   On: 10/24/2022 09:09   CT FOREARM RIGHT WO CONTRAST  Result Date: 10/24/2022 CLINICAL DATA:  Soft tissue infection suspected. Elbow swelling with drainage. EXAM: CT OF THE RIGHT HUMERUS WITHOUT CONTRAST CT OF THE RIGHT FOREARM WITHOUT CONTRAST TECHNIQUE: Multidetector CT imaging was performed according to the standard protocol. Multiplanar CT image reconstructions were also generated. RADIATION DOSE REDUCTION: This exam was performed according to the departmental dose-optimization program which includes automated exposure control, adjustment of the mA and/or kV according to patient size and/or use of iterative reconstruction technique. COMPARISON:  CT chest, abdomen and pelvis, 10/13/2022. Elbow radiographs, 10/13/2022. FINDINGS: Bones/Joint/Cartilage No fracture.  No bone lesion. No bone resorption to suggest osteomyelitis. Chronic full-thickness tears with associated muscle atrophy of the supra and infraspinatus muscles. Humeral head nearly abuts the overlying acromion. No shoulder joint effusion. Elbow joint is normally aligned. No joint effusion. Wrist joints are normally aligned. Ligaments Suboptimally assessed by CT. Muscles and Tendons As noted above, chronic full-thickness tears are noted of the supra and infraspinatus tendons with associated muscle atrophy. Remaining visualized tendons appear intact. Soft tissues There is diffuse subcutaneous soft tissue edema extending from the shoulder through the forearm and into the hand. Ill-defined, but relatively focal fluid is seen posterior to the olecranon. There is no defined fluid collection to suggest an abscess. There is no soft tissue air. IMPRESSION: 1. Diffuse soft tissue edema/cellulitis of the right  upper extremity. There is focal ill-defined fluid posterior to the olecranon, but no defined collection to suggest an abscess. No soft tissue air to indicate fasciitis. 2. No evidence of osteomyelitis. No elbow joint effusion to suggest septic arthropathy. Electronically Signed   By: Amie Portland M.D.   On: 10/24/2022 09:09   VAS [place] UPPER EXTREMITY VENOUS DUPLEX  Result Date: 10/19/2022 UPPER VENOUS STUDY  Patient Name:  BENNY SLOTA  Date of Exam:   10/18/2022 Medical Rec #: [id number]         Accession #:    [id number] Date of Birth: [birth year]         Patient Gender: F Patient Age:   37 years Exam Location:  [facility] Procedure:      VAS [place] UPPER EXTREMITY VENOUS DUPLEX Referring Phys: Trixie Deis --------------------------------------------------------------------------------  Indications: Pain, and Edema Risk Factors: Status post fall with multi trauma. History of breast cancer, s/p mastectomy. History of recurrent mastitis. Limitations: Pain with touch, significant edema throughout upper extremity. Comparison Study: No prior study Performing Technologist: Sherren Kerns RVS  Examination Guidelines: A complete evaluation includes B-mode  imaging, spectral Doppler, color Doppler, and power Doppler as needed of all accessible portions of each vessel. Bilateral testing is considered an integral part of a complete examination. Limited examinations for reoccurring indications may be performed as noted.  Right Findings: +----------+------------+---------+-----------+----------+---------------+ RIGHT     CompressiblePhasicitySpontaneousProperties    Summary     +----------+------------+---------+-----------+----------+---------------+ IJV           Full       Yes       Yes                              +----------+------------+---------+-----------+----------+---------------+ Subclavian    Full       Yes       Yes                               +----------+------------+---------+-----------+----------+---------------+ Axillary                 Yes       Yes                              +----------+------------+---------+-----------+----------+---------------+ Brachial                 Yes       Yes                              +----------+------------+---------+-----------+----------+---------------+ Radial                                              patent by color +----------+------------+---------+-----------+----------+---------------+ Ulnar                                               patent by color +----------+------------+---------+-----------+----------+---------------+ Cephalic      Full       Yes       Yes                              +----------+------------+---------+-----------+----------+---------------+ Basilic                  Yes       Yes                              +----------+------------+---------+-----------+----------+---------------+  Left Findings: +----------+------------+---------+-----------+----------+-------+ LEFT      CompressiblePhasicitySpontaneousPropertiesSummary +----------+------------+---------+-----------+----------+-------+ Subclavian               Yes       Yes                      +----------+------------+---------+-----------+----------+-------+  Summary:  Right: No evidence of deep vein thrombosis in the upper extremity. No evidence of superficial vein thrombosis in the upper extremity.  Left: No evidence of thrombosis in the subclavian.  *See table(s) above for measurements and observations.  Diagnosing physician: Waverly Ferrari MD Electronically signed by Waverly Ferrari MD on 10/19/2022 at 8:31:10 AM.    Final  CT ANGIO HEAD NECK W WO CM  Result Date: 10/15/2022 CLINICAL DATA:  Cerebral aneurysm screening, high-risk concern for aneurysm. EXAM: CT ANGIOGRAPHY HEAD AND NECK WITH AND WITHOUT CONTRAST TECHNIQUE: Multidetector CT imaging of the  head and neck was performed using the standard protocol during bolus administration of intravenous contrast. Multiplanar CT image reconstructions and MIPs were obtained to evaluate the vascular anatomy. Carotid stenosis measurements (when applicable) are obtained utilizing NASCET criteria, using the distal internal carotid diameter as the denominator. RADIATION DOSE REDUCTION: This exam was performed according to the departmental dose-optimization program which includes automated exposure control, adjustment of the mA and/or kV according to patient size and/or use of iterative reconstruction technique. CONTRAST:  75mL OMNIPAQUE IOHEXOL 350 MG/ML SOLN COMPARISON:  Head CT 10/13/2022.  CT chest 10/13/2022. FINDINGS: CT HEAD FINDINGS Brain: No acute hemorrhage. Unchanged mild chronic small-vessel disease and temporal predominant volume loss. Cortical gray-white differentiation is otherwise preserved. Prominence of the ventricles and sulci within expected range for age. No hydrocephalus or extra-axial collection. 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 Vascular: No hyperdense vessel or unexpected calcification. Skull: No calvarial fracture or suspicious bone lesion. Skull base is unremarkable. Sinuses/Orbits: Unremarkable. Other: None. Review of the MIP images confirms the above findings CTA NECK FINDINGS Aortic arch: Three-vessel arch configuration. Atherosclerotic calcifications of the aortic arch and arch vessel origins. Arch vessel origins are patent. Right carotid system: No evidence of dissection, stenosis (50% or greater), or occlusion. Moderate calcified plaque along the proximal right cervical ICA. Left carotid system: No evidence of dissection, stenosis (50% or greater), or occlusion. Moderate calcified plaque along the left carotid bulb and proximal left  cervical ICA. Vertebral arteries: Dominant right vertebral artery. Diminutive left vertebral artery with severe stenosis of the origin. Nonvisualization of the distal V2 and proximal V3 segments with reconstitution of the distal V3 and V4 segments, likely secondary to diminutive size slow flow due to proximal stenosis. Skeleton: Prior C3 laminectomy.  No suspicious bone lesions. Other neck: Unremarkable. Upper chest: Partially visualized small right pleural effusion, better evaluated on recent chest CT. Review of the MIP images confirms the above findings CTA HEAD FINDINGS Anterior circulation: Intracranial ICAs are patent without stenosis or aneurysm. The proximal ACAs and MCAs are patent without stenosis or aneurysm. Distal branches are symmetric. Posterior circulation: Normal basilar artery. The SCAs, AICAs and PICAs are patent proximally. The PCAs are patent proximally without stenosis or aneurysm. Distal branches are symmetric. Venous sinuses: As permitted by contrast timing, patent. Anatomic variants: None. Review of the MIP images confirms the above findings IMPRESSION: 1. No acute intracranial abnormality. Unchanged mild chronic small-vessel disease and temporal predominant volume loss. 2. No large vessel occlusion, hemodynamically significant stenosis or aneurysm of the intracranial vessels. 3. 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 4. Severe stenosis of the diminutive left vertebral artery. Nonvisualization of the distal V2 and proximal V3 segments with reconstitution of the distal V3 and V4 segments, likely secondary to diminutive size and slow flow due to proximal stenosis. Aortic Atherosclerosis (ICD10-I70.0). Electronically Signed   By: Orvan Falconer M.D.   On: 10/15/2022 13:42   CT CHEST ABDOMEN PELVIS W CONTRAST  Result Date: 10/13/2022 CLINICAL DATA:  Pain after fall.  On Eliquis EXAM: CT CHEST, ABDOMEN, AND PELVIS  WITH CONTRAST TECHNIQUE: Multidetector CT imaging of the  chest, abdomen and pelvis was performed following the standard protocol during bolus administration of intravenous contrast. RADIATION DOSE REDUCTION: This exam was performed according to the departmental dose-optimization program which includes automated exposure control, adjustment of the mA and/or kV according to patient size and/or use of iterative reconstruction technique. CONTRAST:  75mL OMNIPAQUE IOHEXOL 350 MG/ML SOLN COMPARISON:  None Available. FINDINGS: CT CHEST FINDINGS Cardiovascular: Heart is enlarged particularly the right atrium. No pericardial effusion. There are calcifications in the area of the aortic valve. The thoracic aorta overall has a normal course and caliber with some scattered vascular calcifications. Coronary artery calcifications are seen. Is also significant calcifications along the great vessels. No mediastinal hematoma. Mediastinum/Nodes: No specific abnormal lymph node enlargement identified in the axillary regions, hilum or mediastinum. Normal caliber thoracic esophagus. Heterogeneous thyroid. Lungs/Pleura: There is a small right pleural effusion. Trace left. Breathing motion seen throughout the examination. There are some areas of bandlike changes along the lung bases likely scar or atelectasis. There are some ill-defined areas of ground-glass in both lungs as well. Please correlate for any evidence of air trapping. Mild bronchial wall thickening identified in several locations. No pneumothorax. Musculoskeletal: Scattered degenerative changes along the spine. Degenerative changes of the shoulders. Fractures involving the anterior aspect of the left second, third, fourth, fifth ribs. Based on appearance these are age-indeterminate. Please correlate to point tenderness. CT ABDOMEN PELVIS FINDINGS Hepatobiliary: Contrast is in the late arterial phase. Grossly preserved hepatic parenchyma. Dependent stone in the nondilated  gallbladder. Patent portal vein. Pancreas: Unremarkable. No pancreatic ductal dilatation or surrounding inflammatory changes. Spleen: Normal in size without focal abnormality. Adrenals/Urinary Tract: Slight thickening of the adrenal glands, nonspecific. Malrotated right kidney. Mild bilateral renal atrophy. No enhancing renal mass or collecting system dilatation. Distended urinary bladder. Stomach/Bowel: No oral contrast. Significant stool in the rectum. Scattered colonic diverticula. No large bowel or small bowel dilatation. Stomach is relatively collapsed. Small bowel is nondilated. No free air or free fluid. Vascular/Lymphatic: Aortic atherosclerosis. No enlarged abdominal or pelvic lymph nodes. Reproductive: Uterus and bilateral adnexa are unremarkable. Other: Nonspecific presacral fat stranding. Diffuse anasarca identified as well. No free intra-air or free fluid. Musculoskeletal: Left-sided rectus muscle hematoma identified. This extends cephalocaudal for proximally 9 cm. Transverse dimensions of 9.3 by 4.4 cm. No active extravasation. Curvature and degenerative changes along the spine. Degenerative changes of the pelvis. IMPRESSION: Left-sided rectus muscle hematoma measuring up to 9.3 cm. No active extravasation. Multiple left-sided anterior possibly acute rib fractures. No underlying pneumothorax. Small right pleural effusion.  Tiny left Enlarged heart. Colonic diverticulosis. Significant stool in the rectum. No bowel obstruction, free air or fluid collection. Gallstone. Anasarca.  Nonspecific stranding in the presacral pelvis. Electronically Signed   By: Karen Kays M.D.   On: 10/13/2022 17:17   CT Head Wo Contrast  Result Date: 10/13/2022 CLINICAL DATA:  Head trauma, minor (Age >= 65y); Facial trauma, blunt; Neck trauma (Age >= 65y) EXAM: CT HEAD WITHOUT CONTRAST CT MAXILLOFACIAL WITHOUT CONTRAST CT CERVICAL SPINE WITHOUT CONTRAST TECHNIQUE: Multidetector CT imaging of the head, cervical spine, and  maxillofacial structures were performed using the standard protocol without intravenous contrast. Multiplanar CT image reconstructions of the cervical spine and maxillofacial structures were also generated. RADIATION DOSE REDUCTION: This exam was performed according to the departmental dose-optimization program which includes automated exposure control, adjustment of the mA and/or kV according to patient size and/or use of iterative reconstruction technique. COMPARISON:  None Available. FINDINGS: CT HEAD FINDINGS Brain: No evidence of  acute infarction, hemorrhage, hydrocephalus, extra-axial collection. There is a 0.0 x 1.9 x 2.2 cm suprasellar mass with mass effect on the optic chiasm. 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 Sequela of moderate chronic microvascular ischemic change. Ventriculomegaly likely secondary to generalized volume loss. Vascular: No hyperdense vessel or unexpected calcification. Skull: Soft tissue hematoma along the left lateral frontal scalp. No evidence of underlying calvarial fracture. Other: None. CT MAXILLOFACIAL FINDINGS Osseous: No fracture or mandibular dislocation. No destructive process. Orbits: Negative. No traumatic or inflammatory finding. Sinuses: No middle ear or mastoid effusion. Paranasal sinuses are clear. Bilateral lens replacement. Orbits are otherwise unremarkable. Soft tissues: Soft tissue injury to the posterior auricular soft tissues on the left. CT CERVICAL SPINE FINDINGS Alignment: Straightening of the normal cervical lordosis. Grade 1 anterolisthesis of C3 on C4 and C4 on C5. Skull base and vertebrae: No acute fracture. No primary bone lesion or focal pathologic process. Soft tissues and spinal canal: No prevertebral fluid or swelling. No visible canal hematoma. Disc levels:  No evidence of high-grade spinal canal  stenosis. Upper chest: Negative. Other: None IMPRESSION: 1. Soft tissue hematoma along the left lateral frontal scalp. No evidence of underlying calvarial fracture or intracranial injury. 2. No acute facial bone fracture. 3. No acute cervical spine fracture. 4. There is a 0.0 x 1.9 x 2.2 cm suprasellar mass with mass effect on the optic chiasm. 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 Electronically Signed   By: Lorenza Cambridge M.D.   On: 10/13/2022 17:11   CT Cervical Spine Wo Contrast  Result Date: 10/13/2022 CLINICAL DATA:  Head trauma, minor (Age >= 65y); Facial trauma, blunt; Neck trauma (Age >= 65y) EXAM: CT HEAD WITHOUT CONTRAST CT MAXILLOFACIAL WITHOUT CONTRAST CT CERVICAL SPINE WITHOUT CONTRAST TECHNIQUE: Multidetector CT imaging of the head, cervical spine, and maxillofacial structures were performed using the standard protocol without intravenous contrast. Multiplanar CT image reconstructions of the cervical spine and maxillofacial structures were also generated. RADIATION DOSE REDUCTION: This exam was performed according to the departmental dose-optimization program which includes automated exposure control, adjustment of the mA and/or kV according to patient size and/or use of iterative reconstruction technique. COMPARISON:  None Available. FINDINGS: CT HEAD FINDINGS Brain: No evidence of acute infarction, hemorrhage, hydrocephalus, extra-axial collection. There is a 0.0 x 1.9 x 2.2 cm suprasellar mass with mass effect on the optic chiasm. 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 Sequela of moderate chronic microvascular ischemic change. Ventriculomegaly likely secondary to generalized volume loss. Vascular: No  hyperdense vessel or unexpected calcification. Skull: Soft tissue hematoma along the left lateral frontal scalp. No evidence of underlying calvarial fracture. Other: None. CT MAXILLOFACIAL FINDINGS Osseous: No fracture or mandibular dislocation. No destructive process. Orbits: Negative. No traumatic or inflammatory finding. Sinuses: No middle ear or mastoid effusion. Paranasal sinuses are clear. Bilateral lens replacement. Orbits are otherwise unremarkable. Soft tissues: Soft tissue injury to the posterior auricular soft tissues on the left. CT CERVICAL SPINE FINDINGS Alignment: Straightening of the normal cervical lordosis. Grade 1 anterolisthesis of C3  on C4 and C4 on C5. Skull base and vertebrae: No acute fracture. No primary bone lesion or focal pathologic process. Soft tissues and spinal canal: No prevertebral fluid or swelling. No visible canal hematoma. Disc levels:  No evidence of high-grade spinal canal stenosis. Upper chest: Negative. Other: None IMPRESSION: 1. Soft tissue hematoma along the left lateral frontal scalp. No evidence of underlying calvarial fracture or intracranial injury. 2. No acute facial bone fracture. 3. No acute cervical spine fracture. 4. There is a 0.0 x 1.9 x 2.2 cm suprasellar mass with mass effect on the optic chiasm. 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 Electronically Signed   By: Lorenza Cambridge M.D.   On: 10/13/2022 17:11   CT Maxillofacial Wo Contrast  Result Date: 10/13/2022 CLINICAL DATA:  Head trauma, minor (Age >= 65y); Facial trauma, blunt; Neck trauma (Age >= 65y) EXAM: CT HEAD WITHOUT CONTRAST CT MAXILLOFACIAL WITHOUT CONTRAST CT CERVICAL SPINE WITHOUT CONTRAST TECHNIQUE: Multidetector CT imaging of the head, cervical spine, and maxillofacial structures were performed using the standard protocol without intravenous  contrast. Multiplanar CT image reconstructions of the cervical spine and maxillofacial structures were also generated. RADIATION DOSE REDUCTION: This exam was performed according to the departmental dose-optimization program which includes automated exposure control, adjustment of the mA and/or kV according to patient size and/or use of iterative reconstruction technique. COMPARISON:  None Available. FINDINGS: CT HEAD FINDINGS Brain: No evidence of acute infarction, hemorrhage, hydrocephalus, extra-axial collection. There is a 0.0 x 1.9 x 2.2 cm suprasellar mass with mass effect on the optic chiasm. 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 Sequela of moderate chronic microvascular ischemic change. Ventriculomegaly likely secondary to generalized volume loss. Vascular: No hyperdense vessel or unexpected calcification. Skull: Soft tissue hematoma along the left lateral frontal scalp. No evidence of underlying calvarial fracture. Other: None. CT MAXILLOFACIAL FINDINGS Osseous: No fracture or mandibular dislocation. No destructive process. Orbits: Negative. No traumatic or inflammatory finding. Sinuses: No middle ear or mastoid effusion. Paranasal sinuses are clear. Bilateral lens replacement. Orbits are otherwise unremarkable. Soft tissues: Soft tissue injury to the posterior auricular soft tissues on the left. CT CERVICAL SPINE FINDINGS Alignment: Straightening of the normal cervical lordosis. Grade 1 anterolisthesis of C3 on C4 and C4 on C5. Skull base and vertebrae: No acute fracture. No primary bone lesion or focal pathologic process. Soft tissues and spinal canal: No prevertebral fluid or swelling. No visible canal hematoma. Disc levels:  No evidence of high-grade spinal canal stenosis. Upper chest: Negative. Other: None IMPRESSION: 1. Soft tissue hematoma along the  left lateral frontal scalp. No evidence of underlying calvarial fracture or intracranial injury. 2. No acute facial bone fracture. 3. No acute cervical spine fracture. 4. There is a 0.0 x 1.9 x 2.2 cm suprasellar mass with mass effect on the optic chiasm. 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 Electronically Signed   By: Lorenza Cambridge M.D.   On: 10/13/2022 17:11   DG Pelvis Portable  Result Date: 10/13/2022 CLINICAL DATA:  Fall. EXAM: PORTABLE PELVIS 1-2 VIEWS  COMPARISON:  None Available. FINDINGS: There is no evidence of pelvic fracture or diastasis. No pelvic bone lesions are seen. IMPRESSION: Negative. Electronically Signed   By: Lupita Raider M.D.   On: 10/13/2022 16:37   DG Shoulder Left Portable  Result Date: 10/13/2022 CLINICAL DATA:  Fall. EXAM: LEFT SHOULDER COMPARISON:  October 17, 2013. FINDINGS: No definite fracture is noted. Old left rib fractures are noted. There does appear to be some elevation of the distal end of the left clavicle relative to acromion which was present on prior exam suggesting acromioclavicular joint separation. IMPRESSION: Stable elevation of distal end of left clavicle relative to the acromion suggesting stable old acromioclavicular joint separation. No acute fracture or dislocation is noted. Electronically Signed   By: Lupita Raider M.D.   On: 10/13/2022 16:37   DG Elbow 2 Views Left  Result Date: 10/13/2022 CLINICAL DATA:  Fall. EXAM: LEFT ELBOW - 2 VIEW COMPARISON:  None Available. FINDINGS: There is no evidence of fracture, dislocation, or joint effusion. There is no evidence of arthropathy or other focal bone abnormality. Soft tissues are unremarkable. IMPRESSION: Negative. Electronically Signed   By: Lupita Raider M.D.   On: 10/13/2022 16:35   DG Knee 2 Views Left  Result Date: 10/13/2022 CLINICAL DATA:  Left  knee pain after fall. EXAM: LEFT KNEE - 1-2 VIEW COMPARISON:  None Available. FINDINGS: No evidence of fracture, dislocation, or joint effusion. No evidence of arthropathy or other focal bone abnormality. Vascular calcifications are noted. IMPRESSION: No significant abnormality seen in the left knee. Electronically Signed   By: Lupita Raider M.D.   On: 10/13/2022 16:33   DG Hip Unilat W or Wo Pelvis 2-3 Views Left  Result Date: 10/13/2022 CLINICAL DATA:  Status post fall.  Rule out fracture. EXAM: DG HIP (WITH OR WITHOUT PELVIS) 2-3V LEFT COMPARISON:  None Available. FINDINGS: There is no evidence of hip fracture or dislocation. There is cortical irregularity of the superior pubic ramus on the left, obscured by formed stool in the rectum. IMPRESSION: 1. No evidence of hip fracture or dislocation. 2. Cortical irregularity of the superior pubic ramus on the left, obscured by formed stool in the rectum. Consider further evaluation with dedicated pelvic radiograph or CT. Electronically Signed   By: Ted Mcalpine M.D.   On: 10/13/2022 16:32   DG Chest Port 1 View  Result Date: 10/13/2022 CLINICAL DATA:  Trauma. EXAM: PORTABLE CHEST 1 VIEW COMPARISON:  Sep 07, 2022 FINDINGS: Cardiomediastinal silhouette is enlarged. Mediastinal contours appear intact. Possible interstitial pulmonary edema with small bilateral pleural effusions. Osseous structures are without acute abnormality. Soft tissues are grossly normal. IMPRESSION: Possible interstitial pulmonary edema with small bilateral pleural effusions. Electronically Signed   By: Ted Mcalpine M.D.   On: 10/13/2022 16:30    DISCHARGE EXAMINATION: Vitals:   10/25/22 2318 10/26/22 0350 10/26/22 0732 10/26/22 0820  BP: 106/76 103/78 116/76   Pulse: (!) 117 99 (!) 141 100  Resp: 13 17 17    Temp: 97.6 F (36.4 C) 97.8 F (36.6 C) (!) 97.5 F (36.4 C)   TempSrc: Axillary Oral Oral   SpO2: 97% 93% (!) 66%   Weight:      Height:       General  appearance: Awake alert.  In no distress.  Hard of hearing Resp: Clear to auscultation bilaterally.  Normal effort Cardio: S1-S2 is normal regular.  No S3-S4.  No rubs murmurs or bruit GI: Abdomen is soft.  Nontender  nondistended.  Bowel sounds are present normal.  No masses organomegaly Right upper extremity swelling has improved.  Erythema is better.   DISPOSITION: SNF  Discharge Instructions     Call MD for:  difficulty breathing, headache or visual disturbances   Complete by: As directed    Call MD for:  extreme fatigue   Complete by: As directed    Call MD for:  persistant dizziness or light-headedness   Complete by: As directed    Call MD for:  persistant nausea and vomiting   Complete by: As directed    Call MD for:  severe uncontrolled pain   Complete by: As directed    Call MD for:  temperature >100.4   Complete by: As directed    Discharge instructions   Complete by: As directed    Please review instructions in the discharge summary.  You were cared for by a hospitalist during your hospital stay. If you have any questions about your discharge medications or the care you received while you were in the hospital after you are discharged, you can call the unit and asked to speak with the hospitalist on call if the hospitalist that took care of you is not available. Once you are discharged, your primary care physician will handle any further medical issues. Please note that NO REFILLS for any discharge medications will be authorized once you are discharged, as it is imperative that you return to your primary care physician (or establish a relationship with a primary care physician if you do not have one) for your aftercare needs so that they can reassess your need for medications and monitor your lab values. If you do not have a primary care physician, you can call [phone] for a physician referral.   Increase activity slowly   Complete by: As directed    No wound care   Complete  by: As directed           Allergies as of 10/26/2022       Reactions   Meperidine Hcl Nausea And Vomiting        Medication List     STOP taking these medications    apixaban 5 MG Tabs tablet Commonly known as: ELIQUIS   potassium chloride 10 MEQ tablet Commonly known as: KLOR-CON M       TAKE these medications    aspirin EC 81 MG tablet Take 1 tablet (81 mg total) by mouth daily. Swallow whole.   atorvastatin 40 MG tablet Commonly known as: LIPITOR Take 1 tablet (40 mg total) by mouth daily. What changed: when to take this   ciprofloxacin 500 MG tablet Commonly known as: CIPRO Take 1 tablet (500 mg total) by mouth 2 (two) times daily for 4 days.   doxycycline 100 MG tablet Commonly known as: VIBRA-TABS Take 1 tablet (100 mg total) by mouth every 12 (twelve) hours for 4 days.   furosemide 20 MG tablet Commonly known as: LASIX Take 20 mg by mouth daily.   HYDROcodone-acetaminophen 5-325 MG tablet Commonly known as: NORCO/VICODIN Take 1 tablet by mouth every 6 (six) hours as needed for moderate pain. What changed:  when to take this reasons to take this   levothyroxine 100 MCG tablet Commonly known as: SYNTHROID Take 1 tablet (100 mcg total) by mouth daily.   metoprolol succinate 25 MG 24 hr tablet Commonly known as: TOPROL-XL Take 1 tablet (25 mg total) by mouth 2 (two) times daily.   ondansetron 4 MG disintegrating tablet  Commonly known as: ZOFRAN-ODT Take 1 tablet (4 mg total) by mouth every 6 (six) hours as needed for nausea.   polyethylene glycol 17 g packet Commonly known as: MIRALAX / GLYCOLAX Take 17 g by mouth 2 (two) times daily.   senna 8.6 MG Tabs tablet Commonly known as: SENOKOT Take 1 tablet by mouth daily.   sodium chloride 1 g tablet Take 1 tablet (1 g total) by mouth 3 (three) times daily with meals.   traZODone 50 MG tablet Commonly known as: DESYREL TAKE 1/2 TO 1 TABLET BY MOUTH AT [facility] NEEDED FOR SLEEP What  changed: See the new instructions.          Contact information for follow-up providers     Excell Seltzer, MD. Schedule an appointment as soon as possible for a visit.   Specialty: Family Medicine Why: To follow-up after recent hospitalization and for management of anticoagulation Contact information: [address] [place] [address] [phone]         [facility]. Call.   Why: As needed Contact information: [address] [place] [address] [phone]             Contact information for after-discharge care     Destination     [facility] Preferred SNF .   Service: Skilled Nursing Contact information: [address] [place] [address] [phone]                     TOTAL DISCHARGE TIME: 35 minutes  Hinata Diener Rito Ehrlich  [facility] Pager on [email or web address]  10/26/2022, 8:51 AM

## 2022-10-26 NOTE — Progress Notes (Signed)
Removed patient's IV, catheter tip intact. Removed tele leads and purewick. Called [facility] and gave report to the nurse at 1215. Discharge paperwork (including narcotic prescription and DNR slip) and patient belongings given to PTAR. Called patient's son to update that patient was en route to [facility]. [facility] transported patient off the unit in a stretcher.

## 2022-10-26 NOTE — Progress Notes (Signed)
Occupational Therapy Treatment Patient Details Name: Diana [name] MRN: [id number] DOB: [birth year] Today's Date: 10/26/2022   History of present illness Pt is a 89 year old admitted 10/13/22 after mechanical fall. Dx with rib fractures L 2-5, scalp laceration and hematoma and small R PE. PMH includes chronic hearing loss, progressive bilateral lower extremity weakness, atrial fibrillation, essential hypertension -[facility]/2022 with cellulitis of the lower extremity.  She was discharged to a rehab facility where she was for a few weeks.  She subsequently was transferred to ALF 6/26 where she fell in the bathroom.   OT comments  Patient supine in bed, agreeable to OT session.  Pt requires mod-max assist for bed mobility, engaged in ADLs at EOB with up to min assist for balance due to posterior lean.  Pt highly fearful of falling throughout session. Requires max encouragement throughout.  Worked on sitting balance and activity tolerance, by donning lotion to BLEs. Supine completed 8 reps bilaterally of shoulder flexion with AAROM.  Remains limited by generalized weakness, impaired cognition, decreased activity tolerance,  and pain.  VSS during session. Will follow acutely.    Recommendations for follow up therapy are one component of a multi-disciplinary discharge planning process, led by the attending physician.  Recommendations may be updated based on patient status, additional functional criteria and insurance authorization.    Assistance Recommended at Discharge Frequent or constant Supervision/Assistance  Patient can return home with the following  A lot of help with bathing/dressing/bathroom;Two people to help with bathing/dressing/bathroom;Direct supervision/assist for medications management;Direct supervision/assist for financial management;Assist for transportation;Help with stairs or ramp for entrance;Assistance with cooking/housework;Two people to help with walking  and/or transfers   Equipment Recommendations  None recommended by OT    Recommendations for Other Services      Precautions / Restrictions Precautions Precautions: Fall Restrictions Weight Bearing Restrictions: No       Mobility Bed Mobility Overal bed mobility: Needs Assistance Bed Mobility: Supine to Sit, Sit to Supine     Supine to sit: Mod assist, HOB elevated Sit to supine: Max assist   General bed mobility comments: pt requires max cueing to initate LEs towards EOB, mod assist for trunk and scooting forward.  Returned to supine with assist for LB and scooting to [facility]    Transfers                   General transfer comment: deferred     Balance Overall balance assessment: Needs assistance Sitting-balance support: No upper extremity supported, Feet supported Sitting balance-Leahy Scale: Poor Sitting balance - Comments: EOB with 1 UE support preference, min guard at best but overall min assist at times due to posterior lean. pt highly fearful of falling Postural control: Posterior lean                                 ADL either performed or assessed with clinical judgement   ADL       Grooming: Minimal assistance;Sitting Grooming Details (indicate cue type and reason): sitting eOB with min guard to min assist due to posterior lean, setup to wash face but assist to comb hair due to UE weakness     Lower Body Bathing: Maximal assistance;Sitting/lateral leans Lower Body Bathing Details (indicate cue type and reason): sitting EOB simulated by donning lotion to [employer] Transfer Details (indicate cue type and reason):  deferred due to pt fatigue         Functional mobility during ADLs: Moderate assistance      Extremity/Trunk Assessment              Vision       Perception     Praxis      Cognition Arousal/Alertness: Awake/alert Behavior During Therapy: Flat affect Overall Cognitive Status:  Impaired/Different from baseline Area of Impairment: Safety/judgement, Problem solving, Attention, Following commands                   Current Attention Level: Focused   Following Commands: Follows one step commands inconsistently, Follows one step commands with increased time Safety/Judgement: Decreased awareness of safety, Decreased awareness of deficits   Problem Solving: Slow processing, Decreased initiation, Difficulty sequencing, Requires verbal cues, Requires tactile cues General Comments: pt with signifincant HOH likley affecting cognition and command following.  She requires mulitmodal cueing, increased time.  Flat affect .        Exercises      Shoulder Instructions       General Comments      Pertinent Vitals/ Pain       Pain Assessment Pain Assessment: Faces Faces Pain Scale: Hurts even more Pain Location: Bilateral Arms (R > L) Pain Descriptors / Indicators: Discomfort, Aching, Sore Pain Intervention(s): Monitored during session, Limited activity within patient's tolerance, Repositioned  Home Living                                          Prior Functioning/Environment              Frequency  Min 2X/week        Progress Toward Goals  OT Goals(current goals can now be found in the care plan section)  Progress towards OT goals: Not progressing toward goals - comment;OT to reassess next treatment  Acute Rehab OT Goals Patient Stated Goal: none stated Time For Goal Achievement: 10/28/22 Potential to Achieve Goals: Good  Plan Discharge plan remains appropriate;Frequency remains appropriate    Co-evaluation                 AM-PAC OT "6 Clicks" Daily Activity     Outcome Measure   Help from another person eating meals?: A Little Help from another person taking care of personal grooming?: A Little Help from another person toileting, which includes using toliet, bedpan, or urinal?: Total Help from another person  bathing (including washing, rinsing, drying)?: A Lot Help from another person to put on and taking off regular upper body clothing?: A Lot Help from another person to put on and taking off regular lower body clothing?: A Lot 6 Click Score: 13    End of Session    OT Visit Diagnosis: Unsteadiness on feet (R26.81);Muscle weakness (generalized) (M62.81);Pain Pain - Right/Left: Right Pain - part of body: Arm (generalized)   Activity Tolerance Patient limited by fatigue   Patient Left in bed;with call bell/phone within reach;with bed alarm set   Nurse Communication Mobility status        Time: 1610-9604 OT Time Calculation (min): 24 min  Charges: OT General Charges $OT Visit: 1 Visit OT Treatments $Self Care/Home Management : 23-37 mins  Barry Brunner, OT Acute Rehabilitation Services Office [phone]   Chancy Milroy 10/26/2022, 11:12 AM

## 2022-10-26 NOTE — TOC Transition Note (Signed)
Transition of Care [facility]) - CM/SW Discharge Note   Patient Details  Name: Diana [name] MRN: [id number] Date of Birth: [birth year]  Transition of [facility] CM/SW Contact:  Eduard Roux, LCSW Phone Number: 10/26/2022, 12:08 PM   Clinical Narrative:     Patient will Discharge to: [facility]  Discharge Date: 10/26/2022 Family Notified: son & please call when pt is leaving unit Transport By: Sharin Mons  Per MD patient is ready for discharge. RN, patient, and facility notified of discharge. Discharge Summary sent to facility. RN given number for [phone], Room 802-A. Ambulance transport requested for patient.   Clinical Social Worker signing off.  Antony Blackbird, MSW, LCSW Clinical Social Worker     Final next level of care: Skilled Nursing Facility Barriers to Discharge: Barriers Resolved   Patient Goals and CMS Choice   Choice offered to / list presented to : [facility] POA / Guardian, Adult Children  Discharge Placement                Patient chooses bed at: [facility] Patient to be transferred to facility by: PTAR Name of family member notified: son Patient and family notified of of transfer: 10/26/22  Discharge Plan and Services Additional resources added to the After Visit Summary for     Discharge Planning Services: CM Consult Post Acute Care Choice: Skilled Nursing Facility                    HH Arranged: PT, OT, Speech Therapy HH Agency: [facility] Date [facility] Agency Contacted: 10/14/22 Time HH Agency Contacted: 1707 Representative spoke with at [facility] Agency: Angie  Social Determinants of Health (SDOH) Interventions SDOH Screenings   Food Insecurity: No Food Insecurity (09/03/2022)  Housing: Low Risk  (09/03/2022)  Transportation Needs: No Transportation Needs (09/03/2022)  Utilities: Not At Risk (09/03/2022)  Alcohol Screen: Low Risk  (06/10/2022)  Depression (PHQ2-9): Low Risk  (06/10/2022)  Financial Resource Strain: Low Risk   (06/10/2022)  Physical Activity: Inactive (06/10/2022)  Social Connections: Socially Isolated (06/10/2022)  Stress: No Stress Concern Present (06/10/2022)  Tobacco Use: Medium Risk (10/13/2022)     Readmission Risk Interventions     No data to display

## 2022-10-27 DIAGNOSIS — D638 Anemia in other chronic diseases classified elsewhere: Secondary | ICD-10-CM | POA: Diagnosis not present

## 2022-10-27 DIAGNOSIS — I10 Essential (primary) hypertension: Secondary | ICD-10-CM | POA: Diagnosis not present

## 2022-10-27 DIAGNOSIS — R296 Repeated falls: Secondary | ICD-10-CM | POA: Diagnosis not present

## 2022-10-27 DIAGNOSIS — R2689 Other abnormalities of gait and mobility: Secondary | ICD-10-CM | POA: Diagnosis not present

## 2022-10-27 DIAGNOSIS — R278 Other lack of coordination: Secondary | ICD-10-CM | POA: Diagnosis not present

## 2022-10-27 DIAGNOSIS — Z9181 History of falling: Secondary | ICD-10-CM | POA: Diagnosis not present

## 2022-10-27 DIAGNOSIS — L03113 Cellulitis of right upper limb: Secondary | ICD-10-CM | POA: Diagnosis not present

## 2022-10-27 DIAGNOSIS — S2242XD Multiple fractures of ribs, left side, subsequent encounter for fracture with routine healing: Secondary | ICD-10-CM | POA: Diagnosis not present

## 2022-10-27 DIAGNOSIS — I5022 Chronic systolic (congestive) heart failure: Secondary | ICD-10-CM | POA: Diagnosis not present

## 2022-10-27 DIAGNOSIS — M6281 Muscle weakness (generalized): Secondary | ICD-10-CM | POA: Diagnosis not present

## 2022-10-27 DIAGNOSIS — S2242XA Multiple fractures of ribs, left side, initial encounter for closed fracture: Secondary | ICD-10-CM | POA: Diagnosis not present

## 2022-10-27 DIAGNOSIS — I480 Paroxysmal atrial fibrillation: Secondary | ICD-10-CM | POA: Diagnosis not present

## 2022-10-27 DIAGNOSIS — E871 Hypo-osmolality and hyponatremia: Secondary | ICD-10-CM | POA: Diagnosis not present

## 2022-10-29 DIAGNOSIS — R296 Repeated falls: Secondary | ICD-10-CM | POA: Diagnosis not present

## 2022-10-29 DIAGNOSIS — I10 Essential (primary) hypertension: Secondary | ICD-10-CM | POA: Diagnosis not present

## 2022-10-29 DIAGNOSIS — I5022 Chronic systolic (congestive) heart failure: Secondary | ICD-10-CM | POA: Diagnosis not present

## 2022-10-29 DIAGNOSIS — S2242XA Multiple fractures of ribs, left side, initial encounter for closed fracture: Secondary | ICD-10-CM | POA: Diagnosis not present

## 2022-10-29 DIAGNOSIS — E871 Hypo-osmolality and hyponatremia: Secondary | ICD-10-CM | POA: Diagnosis not present

## 2022-10-29 DIAGNOSIS — D638 Anemia in other chronic diseases classified elsewhere: Secondary | ICD-10-CM | POA: Diagnosis not present

## 2022-10-29 DIAGNOSIS — M6281 Muscle weakness (generalized): Secondary | ICD-10-CM | POA: Diagnosis not present

## 2022-10-29 DIAGNOSIS — L03113 Cellulitis of right upper limb: Secondary | ICD-10-CM | POA: Diagnosis not present

## 2022-10-29 DIAGNOSIS — I480 Paroxysmal atrial fibrillation: Secondary | ICD-10-CM | POA: Diagnosis not present

## 2022-11-01 DIAGNOSIS — S2242XA Multiple fractures of ribs, left side, initial encounter for closed fracture: Secondary | ICD-10-CM | POA: Diagnosis not present

## 2022-11-01 DIAGNOSIS — E871 Hypo-osmolality and hyponatremia: Secondary | ICD-10-CM | POA: Diagnosis not present

## 2022-11-01 DIAGNOSIS — I5022 Chronic systolic (congestive) heart failure: Secondary | ICD-10-CM | POA: Diagnosis not present

## 2022-11-01 DIAGNOSIS — I480 Paroxysmal atrial fibrillation: Secondary | ICD-10-CM | POA: Diagnosis not present

## 2022-11-01 DIAGNOSIS — I10 Essential (primary) hypertension: Secondary | ICD-10-CM | POA: Diagnosis not present

## 2022-11-01 DIAGNOSIS — D638 Anemia in other chronic diseases classified elsewhere: Secondary | ICD-10-CM | POA: Diagnosis not present

## 2022-11-01 DIAGNOSIS — M6281 Muscle weakness (generalized): Secondary | ICD-10-CM | POA: Diagnosis not present

## 2022-11-01 DIAGNOSIS — L03113 Cellulitis of right upper limb: Secondary | ICD-10-CM | POA: Diagnosis not present

## 2022-11-01 DIAGNOSIS — R296 Repeated falls: Secondary | ICD-10-CM | POA: Diagnosis not present

## 2022-11-02 ENCOUNTER — Other Ambulatory Visit: Payer: Self-pay | Admitting: *Deleted

## 2022-11-02 DIAGNOSIS — Z9181 History of falling: Secondary | ICD-10-CM | POA: Diagnosis not present

## 2022-11-02 DIAGNOSIS — R2689 Other abnormalities of gait and mobility: Secondary | ICD-10-CM | POA: Diagnosis not present

## 2022-11-02 DIAGNOSIS — M6281 Muscle weakness (generalized): Secondary | ICD-10-CM | POA: Diagnosis not present

## 2022-11-02 DIAGNOSIS — E871 Hypo-osmolality and hyponatremia: Secondary | ICD-10-CM | POA: Diagnosis not present

## 2022-11-02 DIAGNOSIS — S2242XA Multiple fractures of ribs, left side, initial encounter for closed fracture: Secondary | ICD-10-CM | POA: Diagnosis not present

## 2022-11-02 DIAGNOSIS — S41101A Unspecified open wound of right upper arm, initial encounter: Secondary | ICD-10-CM | POA: Diagnosis not present

## 2022-11-02 DIAGNOSIS — S2242XD Multiple fractures of ribs, left side, subsequent encounter for fracture with routine healing: Secondary | ICD-10-CM | POA: Diagnosis not present

## 2022-11-02 DIAGNOSIS — L03113 Cellulitis of right upper limb: Secondary | ICD-10-CM | POA: Diagnosis not present

## 2022-11-02 DIAGNOSIS — I5022 Chronic systolic (congestive) heart failure: Secondary | ICD-10-CM | POA: Diagnosis not present

## 2022-11-02 DIAGNOSIS — I480 Paroxysmal atrial fibrillation: Secondary | ICD-10-CM | POA: Diagnosis not present

## 2022-11-02 DIAGNOSIS — D638 Anemia in other chronic diseases classified elsewhere: Secondary | ICD-10-CM | POA: Diagnosis not present

## 2022-11-02 DIAGNOSIS — R278 Other lack of coordination: Secondary | ICD-10-CM | POA: Diagnosis not present

## 2022-11-02 DIAGNOSIS — I10 Essential (primary) hypertension: Secondary | ICD-10-CM | POA: Diagnosis not present

## 2022-11-02 DIAGNOSIS — S51812S Laceration without foreign body of left forearm, sequela: Secondary | ICD-10-CM | POA: Diagnosis not present

## 2022-11-02 DIAGNOSIS — R296 Repeated falls: Secondary | ICD-10-CM | POA: Diagnosis not present

## 2022-11-02 NOTE — Patient Outreach (Signed)
Per [facility] Diana [name] resides in [facility] skilled nursing facility. Screening for potential [facility] care coordination services as benefit of health plan and Primary Care Provider.   Update received from [facility] facility social worker indicating family wants Diana [name] to return to [facility]. However, Diana [name] will need more improvement with therapy before doing so.   Will continue to follow.   [name], MSN, RN,BSN [facility] Post Acute Care Coordinator [phone] (Direct dial)

## 2022-11-03 DIAGNOSIS — I10 Essential (primary) hypertension: Secondary | ICD-10-CM | POA: Diagnosis not present

## 2022-11-03 DIAGNOSIS — I480 Paroxysmal atrial fibrillation: Secondary | ICD-10-CM | POA: Diagnosis not present

## 2022-11-03 DIAGNOSIS — S2242XA Multiple fractures of ribs, left side, initial encounter for closed fracture: Secondary | ICD-10-CM | POA: Diagnosis not present

## 2022-11-03 DIAGNOSIS — M6281 Muscle weakness (generalized): Secondary | ICD-10-CM | POA: Diagnosis not present

## 2022-11-03 DIAGNOSIS — D638 Anemia in other chronic diseases classified elsewhere: Secondary | ICD-10-CM | POA: Diagnosis not present

## 2022-11-03 DIAGNOSIS — I5022 Chronic systolic (congestive) heart failure: Secondary | ICD-10-CM | POA: Diagnosis not present

## 2022-11-03 DIAGNOSIS — L03113 Cellulitis of right upper limb: Secondary | ICD-10-CM | POA: Diagnosis not present

## 2022-11-03 DIAGNOSIS — R296 Repeated falls: Secondary | ICD-10-CM | POA: Diagnosis not present

## 2022-11-03 DIAGNOSIS — E871 Hypo-osmolality and hyponatremia: Secondary | ICD-10-CM | POA: Diagnosis not present

## 2022-11-04 DIAGNOSIS — I480 Paroxysmal atrial fibrillation: Secondary | ICD-10-CM | POA: Diagnosis not present

## 2022-11-04 DIAGNOSIS — E871 Hypo-osmolality and hyponatremia: Secondary | ICD-10-CM | POA: Diagnosis not present

## 2022-11-04 DIAGNOSIS — R296 Repeated falls: Secondary | ICD-10-CM | POA: Diagnosis not present

## 2022-11-04 DIAGNOSIS — D638 Anemia in other chronic diseases classified elsewhere: Secondary | ICD-10-CM | POA: Diagnosis not present

## 2022-11-04 DIAGNOSIS — R464 Slowness and poor responsiveness: Secondary | ICD-10-CM | POA: Diagnosis not present

## 2022-11-04 DIAGNOSIS — S2242XA Multiple fractures of ribs, left side, initial encounter for closed fracture: Secondary | ICD-10-CM | POA: Diagnosis not present

## 2022-11-04 DIAGNOSIS — M6281 Muscle weakness (generalized): Secondary | ICD-10-CM | POA: Diagnosis not present

## 2022-11-04 DIAGNOSIS — L03113 Cellulitis of right upper limb: Secondary | ICD-10-CM | POA: Diagnosis not present

## 2022-11-04 DIAGNOSIS — I5022 Chronic systolic (congestive) heart failure: Secondary | ICD-10-CM | POA: Diagnosis not present

## 2022-11-04 DIAGNOSIS — I10 Essential (primary) hypertension: Secondary | ICD-10-CM | POA: Diagnosis not present

## 2022-11-05 DIAGNOSIS — L03113 Cellulitis of right upper limb: Secondary | ICD-10-CM | POA: Diagnosis not present

## 2022-11-05 DIAGNOSIS — I10 Essential (primary) hypertension: Secondary | ICD-10-CM | POA: Diagnosis not present

## 2022-11-05 DIAGNOSIS — D638 Anemia in other chronic diseases classified elsewhere: Secondary | ICD-10-CM | POA: Diagnosis not present

## 2022-11-05 DIAGNOSIS — R296 Repeated falls: Secondary | ICD-10-CM | POA: Diagnosis not present

## 2022-11-05 DIAGNOSIS — S2242XA Multiple fractures of ribs, left side, initial encounter for closed fracture: Secondary | ICD-10-CM | POA: Diagnosis not present

## 2022-11-05 DIAGNOSIS — I5022 Chronic systolic (congestive) heart failure: Secondary | ICD-10-CM | POA: Diagnosis not present

## 2022-11-05 DIAGNOSIS — S51812S Laceration without foreign body of left forearm, sequela: Secondary | ICD-10-CM | POA: Diagnosis not present

## 2022-11-05 DIAGNOSIS — I480 Paroxysmal atrial fibrillation: Secondary | ICD-10-CM | POA: Diagnosis not present

## 2022-11-09 DIAGNOSIS — J440 Chronic obstructive pulmonary disease with acute lower respiratory infection: Secondary | ICD-10-CM | POA: Diagnosis not present

## 2022-11-09 DIAGNOSIS — I10 Essential (primary) hypertension: Secondary | ICD-10-CM | POA: Diagnosis not present

## 2022-11-10 ENCOUNTER — Telehealth: Payer: Self-pay | Admitting: Family Medicine

## 2022-11-10 NOTE — Telephone Encounter (Signed)
Called son to give my condolences. He was appreciative. Checked [place] [facility]ist for message to complete death certificate... none present, likely completed by hospice/hospital.

## 2022-11-10 NOTE — Telephone Encounter (Signed)
Patients son called to inform Dr Ermalene Searing of the passing of his mom.She passed away on Nov 19, 2022.

## 2022-11-18 DEATH — deceased
